# Patient Record
Sex: Female | Born: 1935 | Race: White | Hispanic: No | Smoking: Former smoker
Health system: Southern US, Community
[De-identification: ages and names within clinical notes are randomized; demographics above are authoritative.]

## PROBLEM LIST (undated history)

## (undated) DIAGNOSIS — U071 COVID-19: Secondary | ICD-10-CM

## (undated) DIAGNOSIS — E785 Hyperlipidemia, unspecified: Secondary | ICD-10-CM

## (undated) DIAGNOSIS — T8450XA Infection and inflammatory reaction due to unspecified internal joint prosthesis, initial encounter: Secondary | ICD-10-CM

## (undated) DIAGNOSIS — J45909 Unspecified asthma, uncomplicated: Secondary | ICD-10-CM

## (undated) DIAGNOSIS — G8929 Other chronic pain: Secondary | ICD-10-CM

## (undated) DIAGNOSIS — F32A Depression, unspecified: Secondary | ICD-10-CM

## (undated) DIAGNOSIS — D509 Iron deficiency anemia, unspecified: Secondary | ICD-10-CM

## (undated) DIAGNOSIS — G4700 Insomnia, unspecified: Secondary | ICD-10-CM

## (undated) DIAGNOSIS — K8020 Calculus of gallbladder without cholecystitis without obstruction: Secondary | ICD-10-CM

## (undated) DIAGNOSIS — N189 Chronic kidney disease, unspecified: Secondary | ICD-10-CM

## (undated) DIAGNOSIS — M1990 Unspecified osteoarthritis, unspecified site: Secondary | ICD-10-CM

## (undated) DIAGNOSIS — G459 Transient cerebral ischemic attack, unspecified: Secondary | ICD-10-CM

## (undated) DIAGNOSIS — F419 Anxiety disorder, unspecified: Secondary | ICD-10-CM

## (undated) DIAGNOSIS — F329 Major depressive disorder, single episode, unspecified: Secondary | ICD-10-CM

## (undated) DIAGNOSIS — J189 Pneumonia, unspecified organism: Secondary | ICD-10-CM

## (undated) DIAGNOSIS — C801 Malignant (primary) neoplasm, unspecified: Secondary | ICD-10-CM

## (undated) DIAGNOSIS — I10 Essential (primary) hypertension: Secondary | ICD-10-CM

## (undated) DIAGNOSIS — E119 Type 2 diabetes mellitus without complications: Secondary | ICD-10-CM

## (undated) DIAGNOSIS — K219 Gastro-esophageal reflux disease without esophagitis: Secondary | ICD-10-CM

## (undated) DIAGNOSIS — R7303 Prediabetes: Secondary | ICD-10-CM

## (undated) DIAGNOSIS — I480 Paroxysmal atrial fibrillation: Secondary | ICD-10-CM

## (undated) HISTORY — DX: Infection and inflammatory reaction due to unspecified internal joint prosthesis, initial encounter: T84.50XA

## (undated) HISTORY — PX: TOTAL HIP ARTHROPLASTY: SHX124

## (undated) HISTORY — PX: OTHER SURGICAL HISTORY: SHX169

## (undated) HISTORY — PX: REPLACEMENT TOTAL KNEE: SUR1224

## (undated) HISTORY — PX: CHOLECYSTECTOMY: SHX55

## (undated) HISTORY — PX: DILATION AND CURETTAGE OF UTERUS: SHX78

---

## 2005-11-26 ENCOUNTER — Ambulatory Visit: Payer: Self-pay | Admitting: Physical Medicine & Rehabilitation

## 2005-11-26 ENCOUNTER — Inpatient Hospital Stay (HOSPITAL_COMMUNITY): Admission: RE | Admit: 2005-11-26 | Discharge: 2005-11-28 | Payer: Self-pay | Admitting: Orthopedic Surgery

## 2005-11-29 ENCOUNTER — Ambulatory Visit: Payer: Self-pay | Admitting: Cardiology

## 2005-11-29 ENCOUNTER — Observation Stay (HOSPITAL_COMMUNITY): Admission: EM | Admit: 2005-11-29 | Discharge: 2005-11-30 | Payer: Self-pay | Admitting: Emergency Medicine

## 2005-12-05 ENCOUNTER — Inpatient Hospital Stay (HOSPITAL_COMMUNITY): Admission: EM | Admit: 2005-12-05 | Discharge: 2005-12-13 | Payer: Self-pay | Admitting: Emergency Medicine

## 2005-12-13 ENCOUNTER — Inpatient Hospital Stay (HOSPITAL_COMMUNITY)
Admission: RE | Admit: 2005-12-13 | Discharge: 2005-12-17 | Payer: Self-pay | Admitting: Physical Medicine & Rehabilitation

## 2006-01-24 ENCOUNTER — Encounter: Admission: RE | Admit: 2006-01-24 | Discharge: 2006-02-20 | Payer: Self-pay | Admitting: Orthopedic Surgery

## 2006-02-21 ENCOUNTER — Encounter: Admission: RE | Admit: 2006-02-21 | Discharge: 2006-03-05 | Payer: Self-pay | Admitting: Orthopedic Surgery

## 2008-05-14 ENCOUNTER — Encounter: Admission: RE | Admit: 2008-05-14 | Discharge: 2008-05-14 | Payer: Self-pay | Admitting: Internal Medicine

## 2009-06-26 ENCOUNTER — Encounter: Admission: RE | Admit: 2009-06-26 | Discharge: 2009-06-26 | Payer: Self-pay | Admitting: Orthopedic Surgery

## 2009-06-27 ENCOUNTER — Encounter: Admission: RE | Admit: 2009-06-27 | Discharge: 2009-06-27 | Payer: Self-pay | Admitting: Orthopedic Surgery

## 2009-08-08 ENCOUNTER — Encounter: Admission: RE | Admit: 2009-08-08 | Discharge: 2009-08-08 | Payer: Self-pay | Admitting: Orthopedic Surgery

## 2009-12-02 ENCOUNTER — Emergency Department (HOSPITAL_COMMUNITY): Admission: EM | Admit: 2009-12-02 | Discharge: 2009-12-02 | Payer: Self-pay | Admitting: Emergency Medicine

## 2009-12-23 ENCOUNTER — Encounter: Admission: RE | Admit: 2009-12-23 | Discharge: 2009-12-23 | Payer: Self-pay | Admitting: Sports Medicine

## 2010-04-19 ENCOUNTER — Encounter: Admission: RE | Admit: 2010-04-19 | Discharge: 2010-04-19 | Payer: Self-pay | Admitting: Internal Medicine

## 2010-05-07 ENCOUNTER — Emergency Department (HOSPITAL_COMMUNITY): Admission: EM | Admit: 2010-05-07 | Discharge: 2010-05-07 | Payer: Self-pay | Admitting: Emergency Medicine

## 2010-06-19 ENCOUNTER — Encounter: Admission: RE | Admit: 2010-06-19 | Discharge: 2010-06-19 | Payer: Self-pay | Admitting: Internal Medicine

## 2010-07-13 ENCOUNTER — Encounter: Admission: RE | Admit: 2010-07-13 | Discharge: 2010-07-13 | Payer: Self-pay | Admitting: Internal Medicine

## 2010-10-09 ENCOUNTER — Inpatient Hospital Stay (HOSPITAL_COMMUNITY)
Admission: RE | Admit: 2010-10-09 | Discharge: 2010-10-11 | Payer: Self-pay | Source: Home / Self Care | Attending: Orthopedic Surgery | Admitting: Orthopedic Surgery

## 2010-10-09 LAB — COMPREHENSIVE METABOLIC PANEL
ALT: 23 U/L (ref 0–35)
AST: 33 U/L (ref 0–37)
Albumin: 3.8 g/dL (ref 3.5–5.2)
Alkaline Phosphatase: 65 U/L (ref 39–117)
BUN: 20 mg/dL (ref 6–23)
CO2: 29 mEq/L (ref 19–32)
Calcium: 9.8 mg/dL (ref 8.4–10.5)
Chloride: 104 mEq/L (ref 96–112)
Creatinine, Ser: 0.99 mg/dL (ref 0.4–1.2)
GFR calc Af Amer: >60 mL/min (ref >60)
GFR calc non Af Amer: 55 mL/min — ABNORMAL LOW (ref >60)
Glucose, Bld: 94 mg/dL (ref 70–99)
Potassium: 5.1 mEq/L (ref 3.5–5.1)
Sodium: 142 mEq/L (ref 135–145)
Total Bilirubin: 0.6 mg/dL (ref 0.3–1.2)
Total Protein: 6.7 g/dL (ref 6.0–8.3)

## 2010-10-09 LAB — URINE MICROSCOPIC-ADD ON

## 2010-10-09 LAB — URINALYSIS, ROUTINE W REFLEX MICROSCOPIC
Bilirubin Urine: NEGATIVE
Hgb urine dipstick: NEGATIVE
Ketones, ur: NEGATIVE mg/dL
Nitrite: NEGATIVE
Protein, ur: NEGATIVE mg/dL
Specific Gravity, Urine: 1.014 (ref 1.005–1.030)
Urine Glucose, Fasting: NEGATIVE mg/dL
Urobilinogen, UA: 0.2 mg/dL (ref 0.0–1.0)
pH: 5 (ref 5.0–8.0)

## 2010-10-09 LAB — DIFFERENTIAL
Basophils Absolute: 0 10*3/uL (ref 0.0–0.1)
Basophils Relative: 0 % (ref 0–1)
Eosinophils Absolute: 0.2 10*3/uL (ref 0.0–0.7)
Eosinophils Relative: 3 % (ref 0–5)
Lymphocytes Relative: 29 % (ref 12–46)
Lymphs Abs: 2.6 10*3/uL (ref 0.7–4.0)
Monocytes Absolute: 0.6 10*3/uL (ref 0.1–1.0)
Monocytes Relative: 7 % (ref 3–12)
Neutro Abs: 5.7 10*3/uL (ref 1.7–7.7)
Neutrophils Relative %: 62 % (ref 43–77)

## 2010-10-09 LAB — HEMOGLOBIN A1C
Hgb A1c MFr Bld: 6 % — ABNORMAL HIGH (ref <5.7)
Mean Plasma Glucose: 126 mg/dL — ABNORMAL HIGH (ref <117)

## 2010-10-09 LAB — CBC
HCT: 41.2 % (ref 36.0–46.0)
Hemoglobin: 13.6 g/dL (ref 12.0–15.0)
MCH: 30.6 pg (ref 26.0–34.0)
MCHC: 33 g/dL (ref 30.0–36.0)
MCV: 92.8 fL (ref 78.0–100.0)
Platelets: 267 10*3/uL (ref 150–400)
RBC: 4.44 MIL/uL (ref 3.87–5.11)
RDW: 13.5 % (ref 11.5–15.5)

## 2010-10-09 LAB — URINE CULTURE: Colony Count: 3000

## 2010-10-09 LAB — SURGICAL PCR SCREEN
MRSA, PCR: NEGATIVE
Staphylococcus aureus: NEGATIVE

## 2010-10-09 LAB — PROTIME-INR: Prothrombin Time: 12.6 seconds (ref 11.6–15.2)

## 2010-10-09 LAB — APTT: aPTT: 33 seconds (ref 24–37)

## 2010-10-11 LAB — GLUCOSE, CAPILLARY
Glucose-Capillary: 110 mg/dL — ABNORMAL HIGH (ref 70–99)
Glucose-Capillary: 155 mg/dL — ABNORMAL HIGH (ref 70–99)
Glucose-Capillary: 133 mg/dL — ABNORMAL HIGH (ref 70–99)
Glucose-Capillary: 124 mg/dL — ABNORMAL HIGH (ref 70–99)
Glucose-Capillary: 145 mg/dL — ABNORMAL HIGH (ref 70–99)
Glucose-Capillary: 112 mg/dL — ABNORMAL HIGH (ref 70–99)

## 2010-10-11 LAB — BASIC METABOLIC PANEL
BUN: 9 mg/dL (ref 6–23)
CO2: 29 mEq/L (ref 19–32)
Calcium: 8.8 mg/dL (ref 8.4–10.5)
Chloride: 107 mEq/L (ref 96–112)
Creatinine, Ser: 0.83 mg/dL (ref 0.4–1.2)
GFR calc Af Amer: >60 mL/min (ref >60)
GFR calc non Af Amer: >60 mL/min (ref >60)
Glucose, Bld: 132 mg/dL — ABNORMAL HIGH (ref 70–99)
Potassium: 5.6 mEq/L — ABNORMAL HIGH (ref 3.5–5.1)
Sodium: 141 mEq/L (ref 135–145)

## 2010-10-11 LAB — CBC
HCT: 32.8 % — ABNORMAL LOW (ref 36.0–46.0)
Hemoglobin: 10.4 g/dL — ABNORMAL LOW (ref 12.0–15.0)
MCH: 29.5 pg (ref 26.0–34.0)
MCHC: 31.7 g/dL (ref 30.0–36.0)
MCV: 93.2 fL (ref 78.0–100.0)
Platelets: 207 10*3/uL (ref 150–400)
RBC: 3.52 MIL/uL — ABNORMAL LOW (ref 3.87–5.11)
RDW: 13.2 % (ref 11.5–15.5)
WBC: 6.2 10*3/uL (ref 4.0–10.5)

## 2010-10-11 LAB — TYPE AND SCREEN
ABO/RH(D): A POS
Antibody Screen: NEGATIVE

## 2010-10-11 LAB — PROTIME-INR
INR: 0.98 (ref 0.00–1.49)
Prothrombin Time: 13.2 seconds (ref 11.6–15.2)

## 2010-10-16 ENCOUNTER — Encounter: Payer: Self-pay | Admitting: Internal Medicine

## 2010-10-16 LAB — CBC
HCT: 29.2 % — ABNORMAL LOW (ref 36.0–46.0)
Hemoglobin: 9.2 g/dL — ABNORMAL LOW (ref 12.0–15.0)
MCH: 29.6 pg (ref 26.0–34.0)
MCHC: 31.5 g/dL (ref 30.0–36.0)
MCV: 93.9 fL (ref 78.0–100.0)
RBC: 3.11 MIL/uL — ABNORMAL LOW (ref 3.87–5.11)
RDW: 13.2 % (ref 11.5–15.5)

## 2010-10-16 LAB — GLUCOSE, CAPILLARY

## 2010-10-16 LAB — BASIC METABOLIC PANEL
BUN: 8 mg/dL (ref 6–23)
Calcium: 8.6 mg/dL (ref 8.4–10.5)
GFR calc Af Amer: >60 mL/min (ref >60)
GFR calc non Af Amer: >60 mL/min (ref >60)

## 2010-10-16 LAB — PROTIME-INR
INR: 1.07 (ref 0.00–1.49)
Prothrombin Time: 14.1 seconds (ref 11.6–15.2)

## 2010-10-25 NOTE — Op Note (Signed)
[name], NGUON               ACCOUNT NO.:  [id number]  MEDICAL RECORD NO.:  [id number]          PATIENT TYPE:  INP  LOCATION:  5022                         FACILITY:  [facility]  PHYSICIAN:  Audree Camel. Noemi Chapel, M.D. DATE OF BIRTH:  [birth year]  DATE OF PROCEDURE:  10/09/2010 DATE OF DISCHARGE:                              OPERATIVE REPORT   PREOPERATIVE DIAGNOSIS:  Left hip degenerative joint disease.  POSTOPERATIVE DIAGNOSIS:  Left hip degenerative joint disease.  PROCEDURE:  Left total hip replacement using DePuy Press-Fit Summit total hip system with acetabulum 52-mm Press-Fit pinnacle acetabulum with 10-degree polyethylene liner and 1 locking screw.  Femoral component #4 Press-Fit Summit, femoral component with +1 x 36 mm femoral head.  SURGEON:  Audree Camel. Noemi Chapel, MD  ASSISTANT:  Matthew Saras, PA  ANESTHESIA:  General.  OPERATIVE TIME:  1 hour and 20 minutes.  ESTIMATED BLOOD LOSS:  250 mL.  COMPLICATIONS:  None.  DESCRIPTION OF PROCEDURE:  Ms. Petruska was brought to the operating room on October 09, 2010, placed on operative table in supine position. After an adequate level of general anesthesia was obtained, her left hip was examined.  She had flexion at 90, extension to 0, internal and external rotation of 10 degrees with approximately 2-1/2 inches of lengthening of the left hip compared to the right.  She had a Foley catheter placed under sterile conditions and received Ancef 2 g IV preoperatively for prophylaxis.  After being placed under general anesthesia, she was turned in the left lateral decubitus position and secured on the bed with a Stulberg frame.  Her left hip and leg was prepped using sterile DuraPrep and draped using sterile technique.  Time- out procedure was called and the correct left hip identified.  Initially through a 15 cm posterolateral greater trochanteric incision, initial exposure was made.  The underlying subcutaneous tissues were  incised along with skin incision.  The iliotibial band and gluteus maximus fascia was incised longitudinally revealing the underlying short external rotators of the hip and hip capsule.  Sciatic nerve carefully protected while the short external rotators of the hip and hip capsule were released off the femoral neck insertion intact.  The hip was then posteriorly dislocated.  The femoral neck cut was then made 1-1/2 to 2 cm above the lesser trochanter in the appropriate manner of anteversion, abduction and inclination.  The femoral head was removed.  She was found to have grade 3 and 4 changes in the femoral head.  Carefully placed retractors were then placed around the acetabulum.  Degenerative acetabular labrum was resected.  She was also found to have grade 3 and 4 DJD in the acetabulum.  At this point, sequential acetabular reaming was carried out up to a 51 mm size in the appropriate manner of anteversion, abduction and inclination.  Then, a 52-mm acetabular trial was placed, found to be an excellent fit.  It was then removed and the actual 52-mm Press-Fit pinnacle acetabular shell was hammered into position and the appropriate manner anteversion, abduction inclination with an excellent fit and then 125 mm locking screw was placed in 12 o'clock position.  A 10 degrees polyethylene liner was then placed with the 10 degrees lip in the posterolateral position.  At this point the proximal femur was then exposed.  Proximal distal reaming was carried out to a #4 size, then broaching to a #4 size and with a #4 broach in place and the appropriate manner of anteversion and inclination and a +1 x 36 mm high offset hip ball and neck, the hip was reduced, taken through range of motion and found to be stable up to 90 degrees of internal rotation in both neutral and 30 degrees of adduction and also stable in abduction, external rotation and leg lengths were made closer to normal with an 1 inch.  At  this point, the femoral trial was then removed.  Femoral canal was irrigated and then the #4 Press-Fit Summit stem was hammered into position with an excellent fit and then the +1 x 36 mm hip ball was hammered on the femoral neck with an excellent Morse taper fit.  The hip then reduced, taken through range of motion, again found to be stable in both internal and external rotation, stable in abduction, external rotation as well as internal rotation and leg lengths within 1 inch of each other.  At this point it was felt that all the components were of excellent size and stability.  The wound was further irrigated with saline and then the short external rotators of the hip and hip capsule were reattached to their femoral neck insertion through 2 drill holes in the greater trochanter.  The iliotibial band and gluteus maximus fascia was closed with #1 Ethibond suture. Subcutaneous tissue was closed with 0 and 2-0 Vicryl, subcuticular layer closed with 4-0 Monocryl.  Sterile dressings and an abduction pillow applied.  The patient then turned supine, checked for leg lengths, they were within 1 inch of other rotation equal, pulses 2+ and symmetric. Needle and sponge counts were correct x2 at the end of the case.  She was then awakened, extubated, and taken to the recovery room in stable condition.     Catalyna Reilly A. Noemi Chapel, M.D.     RAW/MEDQ  D:  10/09/2010  T:  10/10/2010  Job:  [id number]  Electronically Signed by Elsie Saas M.D. on 10/25/2010 10:43:38 AM

## 2011-01-04 ENCOUNTER — Other Ambulatory Visit: Payer: Self-pay | Admitting: *Deleted

## 2011-01-04 ENCOUNTER — Other Ambulatory Visit: Payer: Self-pay | Admitting: Orthopedic Surgery

## 2011-01-04 ENCOUNTER — Encounter (INDEPENDENT_AMBULATORY_CARE_PROVIDER_SITE_OTHER): Payer: Medicare Other | Admitting: *Deleted

## 2011-01-04 DIAGNOSIS — M7989 Other specified soft tissue disorders: Secondary | ICD-10-CM

## 2011-01-04 DIAGNOSIS — M79609 Pain in unspecified limb: Secondary | ICD-10-CM

## 2011-01-04 DIAGNOSIS — R609 Edema, unspecified: Secondary | ICD-10-CM

## 2011-01-08 ENCOUNTER — Encounter: Payer: Self-pay | Admitting: *Deleted

## 2011-02-09 NOTE — Op Note (Signed)
NAMEAVERIANA, [name]               ACCOUNT NO.:  [id number]   MEDICAL RECORD NO.:  [id number]          PATIENT TYPE:  INP   LOCATION:  5013                         FACILITY:  [facility]   PHYSICIAN:  Astrid Divine. Marcelino Scot, M.D. DATE OF BIRTH:  1931-12-03   DATE OF PROCEDURE:  12/10/2005  DATE OF DISCHARGE:                                 OPERATIVE REPORT   PREOPERATIVE DIAGNOSES:  1.  Right midshaft femur fracture.  2.  Left distal radius fracture.   POSTOPERATIVE DIAGNOSES:  1.  Right midshaft femur fracture.  2.  Left distal radius fracture.   PROCEDURE:  1.  Intramedullary nailing of the right femur using a [employer] T2      statically locked 10 x 360 mm nail.  2.  Open reduction internal fixation left distal radius using a DVR anatomic      plate.   SURGEON:  Altamese [place].   ASSISTANT:  Crissie Sickles.   ANESTHESIA:  General.   COMPLICATIONS:  None.   TOTAL TOURNIQUET TIME:  Left upper extremity 37 minutes.   ESTIMATED BLOOD LOSS:  180 mL with reaming.   DISPOSITION:  To PACU condition stable.   BRIEF SUMMARY OF INDICATIONS FOR PROCEDURE:  [name] is a very pleasant  77 year old female who underwent total knee arthroplasty by Dr. Elsie Saas about 4 weeks ago. She did outstanding following the knee replacement  and actually when home on postoperative day #2.  Unfortunately short time  later she was readmitted for a transient ischemic attack and after being  discharged home from that episode, fell in her driveway sustaining a right  femoral shaft fracture as well as left distal radius fracture. These were  treated initially with traction for the right leg and closed reduction of  the left distal radius because of an elevated INR. She was kept on Lovenox  for DVT prophylaxis given her TIA pending resolution of her elevated INR. We  discussed with her the risks and benefits of surgery preoperatively  including possibility of heart attack, stroke, DVT, PE, infection,  nerve  injury, vessel injury, need for further surgery, delayed union, nonunion and  decreased range of motion of the adjacent joints. She understood these risks  quite clearly as did her family who was present for the discussion and they  wished to proceed.   BRIEF DESCRIPTION OF PROCEDURE:  [name] was taken to operating room  after administration of a preoperative antibiotics. Her right lower  extremity and left upper extremity were prepped and draped usual sterile  fashion. We began with the right femur where a 2 cm incision was made  proximal to greater trochanter and a curved cannulated awl used to establish  a piriformis starting point through which a threaded guidewire was advanced.  A curved cannulated awl was then used to advance over the wire into the  proximal femur. The ball-tip guidewire was then advanced in the center  center position of the distal femur across the fracture site watching both  AP and lateral images. We did use manual traction and were able to fine tune  the reduction and facilitated this with use of the same threaded guidewire  into the distal femoral fragment at the anterolateral cortex engaging it  unicortically there. We then sequentially reamed it beginning with the  starting reamer 8 mm going up to 11.5.  The D2 nail was then advanced across  the fracture site and locked with two lateral to medial screws. We did not  use any blocking screws because of the hairline nondisplaced fracture which  extended through the anterior cortex from the distal extent of the split. We  of were able to achieve a nice reduction with less than 3 degrees or so of  relative varus.  The alignment at the knee remained overall excellent. We  then placed two proximal locks.  The wounds were irrigated and closed in  standard layered fashion with 2-0 Vicryl and 3-0 nylon. Sterile gently  compressive dressing were applied from foot to thigh. Attention was then  turned to the  left wrist where we exposed the distal radius through a  standard volar approach incising the FCR tendon sheath and then incising the  deep portion of this carefully continued the dissection until the pronator  fascia was exposed. Before going through the fascia we then used the Esmarch  bandage to exsanguinate the extremity and elevated the tourniquet to 275  mmHg.  We then incised the fascia close to the radial edge leaving a cuff of  tissue for repair. We then used a Freer to tease up the muscle and expose  the fracture site. Homan was placed along the ulnar and radial corners and  then the DVR plate used to achieve appropriate position using a K-wire for  provisional fixation. We were pleased with this and on AP and lateral  projections and then inserted a series of terminally threaded locked pegs  and standard locked pegs into the distal segment. We then snugged the plate  down to the bone and used slight distraction to regain a little bit of  length and had already used the Homan's to produce appropriate inclination  along the radial styloid. Two screws in the shaft were deemed sufficient  given the overall stability of the fixation and final AP and lateral images  showed appropriate reduction with restoration of radial height, inclination  and tilt.  The hardware was of appropriate length after exchanging the  initial radial styloid screw which was slightly too long. The wound was  copiously irrigated and closed with a standard layer fashion with 2-0  Vicryl, a Monocryl and Prolene with Steri-Strips. The patient was then  placed into a well-padded dressing and volar splint, awakened from  anesthesia and transported PACU in stable condition.   PROGNOSIS:  [name] has had multiple complications since her total knee  arthroplasty 4 weeks ago but continues to do quite well after each of them. She will be allowed to weight bear as tolerated through left elbow but  maintain  nonweightbearing to the left wrist.  Once her wound is healed, we  will allow for active and passive motion with supervision of occupational  therapy with regard to her right femur fracture. It is oblique and therefore  unstable for weightbearing.  She will need to maintain nonweightbearing or  touchdown weightbearing over the next 6 to 8 weeks until clinical,  radiographic evidence of some of healing. She will continue on Lovenox for  DVT prophylaxis and will also begin Coumadin discontinuing Lovenox when she  is therapeutic. She remains at elevated risk  for stroke, heart attack and  decreased range of motion, given the recent history of complications.      Astrid Divine. Marcelino Scot, M.D.  Electronically Signed     MHH/MEDQ  D:  12/11/2005  T:  12/12/2005  Job:  [id number]

## 2011-02-09 NOTE — Op Note (Signed)
NAMEJANEAN, PEDRAZA               ACCOUNT NO.:  [id number]   MEDICAL RECORD NO.:  [id number]          PATIENT TYPE:  INP   LOCATION:  2899                         FACILITY:  [facility]   PHYSICIAN:  Audree Camel. Noemi Chapel, M.D. DATE OF BIRTH:  09/02/1932   DATE OF PROCEDURE:  11/26/2005  DATE OF DISCHARGE:                                 OPERATIVE REPORT   PREOPERATIVE DIAGNOSIS:  Right knee DJD.   POSTOPERATIVE DIAGNOSIS:  Right knee DJD.   PROCEDURE:  Right total knee replacement using DePuy cemented total knee  system with number 2.5 cemented femur, number 3 cemented tibia with 12.5 mm  polyethylene RP tibial spacer and 35 mm polyethylene cemented patella.   SURGEON:  Audree Camel. Noemi Chapel, M.D.   ASSISTANT:  Matthew Saras, P.A.   ANESTHESIA:  General.   OPERATIVE TIME:  1 hour 15 minutes.   COMPLICATIONS:  None.   DESCRIPTION OF PROCEDURE:  Ms. Gamel was brought to operating room on  11/26/2005 after a femoral nerve block and placed in the holding room by  anesthesia. She was placed operative table in the supine position. She  received Ancef 4 grams IV preoperatively for prophylaxis. After being placed  under general anesthesia, she had a Foley catheter placed under sterile  conditions. A right knee was examined. Range of motion from minus 8-120  degrees with significant varus deformity, knee stable to ligamentous exam  with normal patellar tracking. Right leg was prepped using sterile DuraPrep  and draped using sterile technique. Leg was exsanguinated and a thigh  tourniquet elevated 365 mm. Initially through a 15 cm longitudinal incision  based over the patella, initial exposure was made.  The underlying  subcutaneous tissues were incised in line with the skin incision. A median  arthrotomy was performed revealing an excessive amount of normal appearing  joint fluid. The articular surfaces were inspected. She had grade 4 changes  medially, grade 3 changes laterally and grade  3 and 4 changes in the  patellofemoral joint. The medial and lateral meniscal remnants were made as  well as the anterior cruciate ligament. Osteophytes were removed off the  femoral condyles and tibial plateau. Intramedullary drill was then drilled  up the femoral canal for placement distal femoral cutting jig which was  placed in the appropriate manner rotation and the distal 12 mm cut was made.  The distal femur was sized. 2.5 was found be the appropriate size and the  2.5 cutting jig was placed and these cuts were made. The proximal tibia was  then exposed. The tibial spines were removed with an oscillating saw.  Intramedullary drill was drilled down the tibial canal for placement of  proximal tibial cutting jig which was placed in the appropriate amount of  rotation and a proximal 8 mm cut was made based off the medial or lower  side. Spacer blocks were then placed in flexion and extension.  12.5 mm  blocks gave excellent stability and excellent correction of her flexion and  varus deformities. The #3 tibial base plate was then placed on the cut  surface of the proximal tibia  and the keel cut was made. After this was done  the PCL box cutter was placed on the distal femur. These cuts were then  made. At this point the number 2.5 femoral trial was placed. A #3 tibial  base plate trial was placed and with a 12.5 mm polyethylene RP tibial spacer  the knee was reduced, taken through range of motion from 0 to 125 degrees  with excellent stability and excellent correction of her flexion and varus  deformities. The patella was sized. A resurfacing 8 mm cut was made and  three locking holes were placed and then the 35 mm polyethylene patellar  trial was placed. Patellofemoral tracking was then evaluated and this was  found to be normal. At this point felt that all the trial components were of  excellent size, fit and stability. They were then removed and the knee was  then jet lavage irrigated  with 3 liters saline solution. The proximal tibia  was exposed and #3 tibial baseplate with cement backing was hammered in  position with an excellent fit with excess cement being removed from around  the edges. Number 2.5 femoral component with cement backing was hammered  into position also with excellent fit with excess cement being removed from  around the edges. A 12.5 mm polyethylene RP tibial spacer was placed on the  tibial baseplate. The knee reduced, taken through range of motion from 0 to  125 degrees with excellent stability and excellent correction of her flexion  and varus deformities. The 35 mm polyethylene cement backed patella was then  placed in this position and held there with a clamp. After the cement  hardened, patellofemoral tracking was again evaluated and this was found to  be normal. At this point it was felt that all components were excellent  size, fit and stability. They were further irrigated with saline. The  tourniquet was released. Hemostasis was obtained with cautery. The  arthrotomy was then closed with #1 Ethibond suture over two medium Hemovac  drains. Subcutaneous tissues closed with 0 and 2-0 Vicryl.  Subcuticular  layer closed with 4-0 Monocryl. Steri-Strips were applied. Sterile dressings  were applied. Hemovac injected with 0.2% Marcaine with epinephrine and 4  milligrams morphine. The patient awakened, extubated and taken to recovery  room in stable condition. Needle, sponge counts correct x2 in the case.      Robert A. Noemi Chapel, M.D.  Electronically Signed     RAW/MEDQ  D:  11/26/2005  T:  11/26/2005  Job:  [id number]

## 2011-02-09 NOTE — Discharge Summary (Signed)
NAMEJELANI, HLAVATY               ACCOUNT NO.:  [id number]   MEDICAL RECORD NO.:  [id number]          PATIENT TYPE:  IPS   LOCATION:  4005                         FACILITY:  [facility]   PHYSICIAN:  Audree Camel. Noemi Chapel, M.D. DATE OF BIRTH:  12-12-1931   DATE OF ADMISSION:  12/13/2005  DATE OF DISCHARGE:  12/17/2005                                 DISCHARGE SUMMARY   ADMISSION DIAGNOSES:  1.  End-stage degenerative joint disease, right knee.  2.  Hypertension.  3.  High cholesterol.  4.  Gastroesophageal reflux.   DISCHARGE DIAGNOSES:  1.  End-stage degenerative joint disease; status post right total knee      replacement.  2.  Hypertension.  3.  High cholesterol.  4.  Gastroesophageal reflux.   HISTORY OF PRESENT ILLNESS:  The patient is a 77 year old white female with  a history of hypertension, COPD and end-stage DJD.  She has failed  conservative care, including anti-inflammatories and intra-articular  cortisone injections.  She understands the risks, benefits and possible  complications of right total knee replacement, and is without question.   PROCEDURES IN-HOUSE:  On November 26, 2005 the patient underwent a right total  knee replacement by Dr. Noemi Chapel, and had a femoral block by anesthesia.  She  was admitted postoperatively for pain control, DVT prophylaxis and physical  therapy.   HOSPITAL COURSE:  On postoperative day 1 the patient was doing very well,  without complaints.  Hemoglobin was 9.9.  She was metabolically stable.  Her  INR was 1.0.  The surgical wound was well approximated.  Her drain was  discontinued by me.  Her PCA was discontinued.  She was started on Percocet  and Robaxin.  She is on a CPM 0-90 degrees.   On postoperative day #2 the patient did remarkably well with physical  therapy, and is requesting to go home early.  She ambulated 300 feet and up  and down 3 steps.   On postoperative day #3 the patient was doing well; she had no complaints.  The Vicodin  was not strong enough, so she was placed on Percocet.  Hemoglobin was 9.3.  She was metabolically stable.  Her INR was 1.3.  The  surgical wound was well approximated.  She did have some drainage from her  lateral drain hole.   She was discharged in stable condition, weightbearing as tolerated and on a  regular diet.  Home CPM.   DISCHARGE MEDICATIONS:  1.  She was given a prescription for Norco 10 325 mg 1-2 q.4-6 h. p.r.n.      pain.  2.  Robaxin 500 mg 1-2 q.6 h. p.r.n. muscle spasm.  3.  Restoril 15 mg one p.o. q.h.s.  4.  Coumadin 5 mg 1-1/2 tablets daily.  5.  Gemfibrizil 600 mg b.i.d.  6.  Atenolol 50 mg daily.  7.  Xanax 0.5 mg as needed.  8.  Nexium 40 mg daily.  9.  Norvasc 10 mg daily.  10. Albuterol and Flovent daily.   SPECIAL INSTRUCTIONS:  She will follow-up with Dr. Noemi Chapel on December 10, 2005.  Kirstin Shepperson, P.A.      Robert A. Noemi Chapel, M.D.  Electronically Signed    KS/MEDQ  D:  01/11/2006  T:  01/14/2006  Job:  [id number]

## 2011-02-09 NOTE — Discharge Summary (Signed)
[name], [name]               ACCOUNT NO.:  [id number]   MEDICAL RECORD NO.:  [id number]          PATIENT TYPE:  INP   LOCATION:  [id number]                         FACILITY:  [facility]   PHYSICIAN:  [name], M.D.DATE OF BIRTH:  12-12-1931   DATE OF ADMISSION:  11/29/2005  DATE OF DISCHARGE:  11/30/2005                                 DISCHARGE SUMMARY   FINAL DIAGNOSIS:  Right [name] and arm numbness, unknown etiology (782.0).   PROCEDURE:  1.  MRI brain.  2.  MRA intracranial.  3.  2-D echocardiogram.  4.  Carotid Doppler.  5.  Blood chemistries.   COMPLICATIONS:  None.   HISTORY OF PRESENT ILLNESS:  The patient is a 77 year old woman who had 2  episodes of numbness and paresthesias involving the right lower [name], hand,  and forearm without other focal symptoms and with complete resolution. She  was admitted to evaluate her neurovascular system for possible transient  ischemic attacks.   HOSPITAL COURSE:  The patient has been well in the hospital. She has had no  further episodes. The patient just recently had total knee replacement on  the right side. Was not treated with Lovenox as a bridging dose and was sent  home on Coumadin. Her INR yesterday was 1.6. She received Coumadin 7.5 mg  last night and will receive 5 mg today, tomorrow, and Sunday. She will have  her prothrombin time checked on Monday by a visiting nurse.   The patient had an MRI scan of the brain, which I have reviewed. It shows  mild atrophy and some chronic small vessel changes but no evidence of acute  stroke. MRA showed evidence of rather diffuse irregularity of the extra-  cranial carotid; the intracranial middle cerebral artery, left greater than  right; and the right posterior cerebral artery. The basilar artery looked  fairly normal, both in terms of its size and regularity.   The patient's risk factors for stroke include hypertension, dyslipidemia,  associated co-morbidities,  gastroesophageal reflux, osteoarthritis,  insomnia, and obesity.   A 2-D echocardiogram and carotid Doppler are pending at this time. TCD was  also pending.   The patient's homocystine is normal at 6.0. Lipid panel and hemoglobin A1C  are pending at this time.   LABORATORY DATA:  Hemoglobin and hematocrit 9.4 and 27.4. White blood cell  count 8,700. Platelet count 306,000. MCV 91.2. Sodium 138, potassium 4.1,  chloride 107, CO2 28, BUN 9, creatinine 0.7, glucose 116. Other random  glucose's have been 141 and 128, suggesting that the patient may have some  glucose intolerance. Total protein was 6.0. Prothrombin time on November 29, 2005 was 1.6. PTT 53. AST 56, ALT 43 and slightly elevated. Albumin 2.5,  somewhat low. Alkaline phosphatase 54, total bilirubin 0.5. Calcium 8.2.  Hemoglobin A1C is 6.0, which is within normal range. Homocystine is pending  at this time.   PLAN:  The patient is doing well. She will be discharged on the following  medications.   DISCHARGE MEDICATIONS:  1.  Norvasc 10 mg daily.  2.  Gemfibrozil 600 mg twice daily.  3.  Alprazolam 0.5 mg three times daily as needed.  4.  Nexium 40 mg every morning.  5.  Atenolol 50 mg daily.  6.  Warfarin 5 mg for the 9th, 10th, and 11th.  7.  Temazepam 15 to 30 mg at bedtime as needed.  8.  Methocarbamol 500 mg every 4 to 6 hours as needed for spasm.  9.  Hydrocodone APAP 1 to 2 tablets every 4 to 6 hours as needed for pain.  10. Docusate 100 mg twice daily.  11. Flovent 2 puffs twice daily as needed.  12. Ventolin 1 puff every 6 hours as needed.   CONDITION ON DISCHARGE:  Improved.   FOLLOW UP:  She will return to followup with [name] in our neurologic  followup clinic in 6 to 8 weeks' time.      [name], M.D.  Electronically Signed     WHH/MEDQ  D:  11/30/2005  T:  12/01/2005  Job:  [id number]   cc:   [name], M.D.   [name], N.P.

## 2011-02-09 NOTE — H&P (Signed)
[name], [name] NO.:  [id number]   MEDICAL RECORD NO.:  [id number]          PATIENT TYPE:  IPS   LOCATION:  4005                         FACILITY:  [facility]   PHYSICIAN:  Jarvis Morgan, M.D.   DATE OF BIRTH:  [birth year]   DATE OF ADMISSION:  12/13/2005  DATE OF DISCHARGE:                                HISTORY & PHYSICAL   PRIMARY CARE PHYSICIAN:  Aliene Beams, M.D.   NEUROLOGIST:  Pramod P. Leonie Man, M.D.   ORTHOPEDIST:  Astrid Divine. Marcelino Scot, M.D.   HISTORY OF PRESENT ILLNESS:  [name] is a 77 year old adult female with  a history of hypertension who has undergone a right total knee replacement  on November 26, 2005.   The patient reports tingling in her arm a couple of days after admission and  was reevaluated.  She subsequently was released.  She returned home and had  a fall at home on December 05, 2005.  At that time, she was brought into the  emergency room where x-rays showed a right mid shaft femur fracture with  displacement along with a distal left radius fracture.   The patient was placed on traction and neurology was consulted for input and  clearance for surgery.  Once her INR was reversed, the patient underwent  intermedullary nailing of the right femur along with ORIF of the distal left  radius, December 10, 2005, by Dr. Marcelino Scot.   Postoperatively, the patient was allowed nonweightbearing on the left upper  extremity and the right lower extremity.  Coumadin was resumed with INR of  1.3 today.  Neurology recommended restart of aspirin after Coumadin was  completed for DVT prophylaxis.   REVIEW OF SYSTEMS:  Positive for insomnia, anxiety, wound healing.   PAST MEDICAL HISTORY:  1.  Hypertension.  2.  Right total knee replacement November 26, 2005.  3.  Gastroesophageal reflux disease.  4.  Obesity.  5.  Transient ischemic attack November 28, 2005.  6.  Prior cholecystectomy.  7.  Dyslipidemia.  8.  Asthmatic bronchitis.  9.  Prior cesarean section.  10.  Anxiety disorder.   FAMILY HISTORY:  Positive for diabetes mellitus and coronary artery disease.   SOCIAL HISTORY:  The patient lives alone in a one-level home with a ramp at  the entry.  She is independent with a walker.  Family is available to assist  as needed at discharge. The patient quit tobacco more than 30 years ago and  denies alcohol usage.   FUNCTIONAL HISTORY:  Prior to admission:  Independent with a walker  secondary to recent knee surgery.   ALLERGIES:  HYDROCHLOROTHIAZIDE, LISINOPRIL, ZOCOR.   MEDICATIONS:  Prior to admission:  1.  Coumadin for DVT prophylaxis.  2.  Norvasc 10 mg daily.  3.  Lopid 600 mg b.i.d.  4.  Xanax 0.5 mg t.i.d. p.r.n.  5.  Nexium 40 mg daily.  6.  Atenolol 50 mg daily.  7.  Restoril 15 mg q.h.s.  8.  Robaxin p.r.n.  9.  Vicodin p.r.n.  10. Flovent p.r.n.  11. Ventolin p.r.n.   RECENT LABS:  Hemoglobin 9.7, hematocrit 29.0, platelet count 654,000, and  white count was 10.0. Recent sodium was 139, potassium 4.5, chloride 104,  CO2 28, BUN 14, and creatinine 0.7 with glucose of 112.   PHYSICAL EXAMINATION:  GENERAL:  Reasonably well appearing, overweight,  adult female lying in bed in no acute discomfort.  HEENT:  Normocephalic and atraumatic.  CARDIOVASCULAR:  Regular rate and rhythm, S1 and S2, without murmurs.  ABDOMEN:  Soft, obese, nontender with positive bowel sounds.  LUNGS:  Clear to auscultation bilaterally.  NEUROLOGY:  Alert and oriented x3.  Cranial nerves II-XII grossly intact.  Right upper and left lower extremity examination showed 5/5 strength  throughout.  Bulk and tone were normal.  Reflexes were 2+ and symmetrical.  Left upper extremity revealed a splint from her wrist to her elbow on the  left.  She had weak grip on the left and 4-/5 strength in the proximal left  upper extremity.  Right lower extremity examination showed well-healing  right knee wound where a prior knee replacement had been done.  There were  also  approximately three areas with sutures in place where the  intermedullary nailing was done.  There is slight bruising of the right  lower extremity with some mild edema.  Strength in the right lower extremity  was not tested secondary to nonweightbearing restriction.   IMPRESSION:  1.  Status post recent right total knee replacement with subsequent fall and      development of distal left radius fracture and mid shaft femur fracture      on the right.  Presently, the patient had a deficit in ADL's, transfers,      and ambulation secondary to the total knee replacement along with the      subsequent fall and resultant fractures of the left upper and right      lower extremity.   PLAN:  Admit to the rehabilitation unit for daily OT and PT therapies to  advance ADL's, transfers, and ambulation with nonweightbearing on the left  upper and right lower extremity unless advanced by Dr. Marcelino Scot, orthopedics.  24-hour nursing for medications administration, monitoring of vitals, and  wound dressing changes as needed.  Check admission labs, insulin, CBC, and  CMET Friday, December 14, 2005.  Monitor hypertension on Norvasc 10 mg daily  and Tenormin 50 mg daily.  Continue Lipitor 10 mg daily dispense as written  along with Lopid 600 mg p.o. b.i.d.  Continue Flovent 220 mcg one puff  b.i.d. along with Ventolin metered dose inhaler 1-2 puffs q.i.d. p.r.n.  Increase OxyContin CR to 40 mg q.12 hours along with Norco 10/325 one tablet  p.o. q.4-6 hours p.r.n. for pain, dispense as written.  Continue Nexium 40  mg daily for GI prophylaxis.  Add Trinsicon one tablet p.o. b.i.d. for  postoperative anemia.  Ice pack to the right leg p.r.n.  Robaxin 500 mg  q.i.d. for spasms. Continue subcu Lovenox 40 mg daily until INR consistently  greater than 2.0 on Coumadin.  Continue Coumadin per pharmacy.   PROGNOSIS:  Good.  ESTIMATED LENGTH OF STAY:  5-10 days.   GOALS:  Minimal assist to standby assist for transfers  and standby assist to  minimal assist with ADL's and ambulation.           ______________________________  Jarvis Morgan, M.D.     DC/MEDQ  D:  12/13/2005  T:  12/14/2005  Job:  [id number]

## 2011-02-09 NOTE — Discharge Summary (Signed)
NAMELEVA, KAMINSKI               ACCOUNT NO.:  [id number]   MEDICAL RECORD NO.:  [id number]          PATIENT TYPE:  INP   LOCATION:  5013                         FACILITY:  [facility]   PHYSICIAN:  Astrid Divine. Marcelino Scot, M.D. DATE OF BIRTH:  1932/09/03   DATE OF ADMISSION:  12/05/2005  DATE OF DISCHARGE:  12/13/2005                                 DISCHARGE SUMMARY   ADMISSION DIAGNOSES:  1.  Right femur fracture.  2.  Left distal radius fracture.   SECONDARY DIAGNOSES:  1.  Status post total knee arthroplasty on the right within the past month.  2.  History of hypertension.  3.  History of esophageal reflux.  4.  History of osteoarthritis.  5.  History of insomnia.  6.  History of obesity.  7.  History of hyperlipidemia.  8.  History of chronic obstructive pulmonary disease.   DISCHARGE DIAGNOSES:  1.  Right femur fracture.  2.  Left distal radius fracture.  3.  Status post total knee arthroplasty on the right within the past month.  4.  History of hypertension.  5.  History of esophageal reflux.  6.  History of osteoarthritis.  7.  History of insomnia.  8.  History of obesity.  9.  History of hyperlipidemia.  10. History of chronic obstructive pulmonary disease.   OPERATION/PROCEDURE:  1.  Open reduction, internal fixation left wrist distal radius fracture.  2.  Intermedullary nailing right femur fracture with __________ 10 x 360      statically locked nail on December 10, 2005.   CONSULTATIONS:  Stroke team was consulted for evaluation and management.   BRIEF HISTORY:  The patient is a 77 year old white female who was Monday  status post right total knee replacement by Dr. Noemi Chapel when she fell walking  in her driveway. She has a history of TIAs two weeks ago. She has a history  of increased cholesterol, GERD, osteoarthritis, insomnia, status post  cholecystectomy, C-section. She was admitted through the emergency room for  pain control. Dr. Altamese Dodson was consulted for  evaluation and treatment  of right femur fracture and left distal radius fracture. The stroke team was  also consulted for evaluation of her recent TIAs and anticoagulation therapy  recommendations. On hospital day #2, the patient was doing well, right lower  extremity was in traction, neurovascularly intact, left lower extremity in a  splint stable, neurovascularly intact distally. Placed on Lovenox because of  recent TIAs. Arrangements were made to take her to the operating room per  Dr. Marcelino Scot for treatment of these fractures when medically stable. Risks,  complications, and postoperative expectations explained to the patient in  detail. Stroke team saw the patient and felt that she was at a comfortable  surgical risk to proceed forward with right femur and left wrist surgery.  The patient remained comfortable during her hospitalization. Arrangements  were made to take her to the operating room on March 19th. She was NPO after  midnight. All permits were obtained. INR was 1.0. She was ready and stable  for surgery. On December 10, 2005, underwent ORIF of  left distal radius  fracture and IM nailing of right femur. She tolerated these procedures well  under general anesthesia. She was continued on her home medications.  Restarted on Coumadin postoperatively. Active range of motion and passive  range of motion of the hip and knee were started in the right lower  extremity. The left upper extremity was stable in the splint.  Postoperatively, she was comfortable, afebrile, vital signs stable,  incisions were fine. Dressings were changed. Labs were within normal limits.  Discharge planning was arranged. Physical therapy and occupational therapy  to work on extension of the knee. She was ready for discharge to rehab on  December 13, 2005. She is continued on Coumadin with INR of 1.3, continue  physical therapy and occupational therapy, continue with her home  medications.  Discontinue her PCA pump.  Hep-lock her IV.   PERTINENT LABORATORIES:  Admission WBC 10.0, hematocrit 29.0, hemoglobin  9.7.  At the time of discharge WBC 4.9, hemoglobin 7.5, hematocrit 30.8,  platelet count 367,000. PT at the time of discharge was 16.5 with an INR of  1.3, on Coumadin therapy targeting to be between 2 and 3 therapeutic.  Continue per pharmacy protocol. At the time of discharge, potassium was 4.1,  sodium 138. All of his chemistry indices can be obtained from the hospital  record.   DISCHARGE CONDITION:  Stable and improved.   DISCHARGE MEDICATIONS:  1.  Norvasc 10 mg p.o. daily.  2.  Ventolin one puff q.6h. p.r.n.  3.  Flovent two puffs b.i.d.  4.  Colace 100 mg p.o. b.i.d.  5.  Temazepam 50 mg p.o. q.h.s.  6.  Atenolol 50 mg p.o. daily.  7.  Nexium 40 mg p.o. daily.  8.  Alprazolam 0.5 mg t.i.d. p.r.n.  9.  Gemfibrozil 600 mg b.i.d.  10. Coumadin 7.5 mg to be dosed per pharmacy.   To contact our office if any questions or concerns. Will follow during the  rehab stay and then she will be followed as an outpatient per team.      Merlene Pulling. Toney Sang.      Astrid Divine. Marcelino Scot, M.D.  Electronically Signed   SCI/MEDQ  D:  01/25/2006  T:  01/26/2006  Job:  [id number]

## 2011-02-09 NOTE — H&P (Signed)
[name], [name]               ACCOUNT NO.:  [id number]   MEDICAL RECORD NO.:  [id number]          PATIENT TYPE:  INP   LOCATION:  [id number]                         FACILITY:  [facility]   PHYSICIAN:  Princess Bruins. Hickling, M.D.DATE OF BIRTH:  January 04, 1932   DATE OF ADMISSION:  11/29/2005  DATE OF DISCHARGE:                                HISTORY & PHYSICAL   CHIEF COMPLAINT:  Numb right face, hand, and arm.   HISTORY OF PRESENT ILLNESS:  77 year old right-handed widowed woman who had  two two-minute episodes of paraesthesias of her right lower face, hand, and  forearm without other focal symptoms and with complete resolution occurring  at 1:30 p.m. and 4:15 p.m.  Patient was brought to the emergency room where  she was evaluated.  I was asked to see her and agreed to admit her to  evaluate possible TIA of the left brain.   PAST MEDICAL HISTORY:  1.  Hypertension.  2.  Dyslipidemia.  3.  Gastroesophageal reflux disease.  4.  Osteoarthritis.  5.  Insomnia.   PAST SURGICAL HISTORY:  1.  Cholecystectomy.  2.  Cesarean section.  3.  Right total knee (in the past week).   MEDICATIONS:  1.  Norvasc 10 mg once daily.  2.  Gemfibrozil 600 mg twice daily.  3.  Alprazolam 0.5 mg three times daily as needed.  4.  Nexium 40 mg every morning.  5.  Hydrocodone.  6.  Acetaminophen one to two every four hours as needed.  7.  Restoril 15 mg at bedtime as needed.  8.  Robaxin 500 mg one to two every four to six hours as needed.  9.  Atenolol 50 mg once a day.  10. Warfarin 7.5 mg today, 5 mg daily starting tomorrow, November 30, 2005.   ALLERGIES:  HYDROCHLOROTHIAZIDE/TRIAMTERENE (hives), PERCOCET (itching),  ZOCOR (intractable vomiting).   FAMILY HISTORY:  Positive for stroke in her mother who had several mini  strokes and a couple of larger strokes in her 36s and 50s, but did not die  of stroke.  Multiple family members with heart disease.   SOCIAL HISTORY:  Patient does not use tobacco or  alcohol.  She lives alone  and she is fully independent.  She has seven children and multiple  grandchildren.   REVIEW OF SYSTEMS:  Patient is obese.  She has good appetite.  She has some  difficulty falling asleep.  HEENT:  No otitis media, pharyngitis, sinusitis,  or epistaxis.  PULMONARY:  Patient has asthma and sometimes shortness of  breath.  No active lung problems.  CARDIOVASCULAR:  No palpitations, heart  murmur, or angina.  No prior MI.  GASTROINTESTINAL:  No nausea, vomiting,  diarrhea.  GENITOURINARY:  No urinary tract infection or hematuria.  MUSCULOSKELETAL:  No fractures or deformities, recent total knee replacement  secondary to osteoarthritis.  SKIN:  The patient has some stasis changes in  her legs.  HEMATOLOGIC:  No anemia, bruisability, lymphadenopathy.  ENDOCRINE:  No diabetes or thyroid disease.  ALLERGY/IMMUNOLOGY:  No known  environmental allergies.  NEURO/PSYCH:  No depression, anxiety.  NEUROLOGIC:  No diplopia, dysarthria, dysphagia, weakness, loss of bowel and bladder  control, seizures.  12-system review is otherwise negative.   PHYSICAL EXAMINATION:  VITAL SIGNS:  Blood pressure 130/59, resting pulse  79, respirations 16, temperature 98.8, oxygen saturation 99%.  HEENT:  No bruits.  LUNGS:  Clear.  HEART:  No murmurs.  Pulses normal.  ABDOMEN:  Soft.  Bowel sounds normal.  No hepatosplenomegaly.  Abdomen is  protuberant.  EXTREMITIES:  Healing right total knee replacement without edema or  cyanosis.  NEUROLOGIC:  NIH stroke scale was 3.  She was not able to lift her right  knee due to surgery but she can move it.  Mental status:  Alert, oriented.  No dysphagia, dyspraxia.  Cranial nerves:  Round, reactive pupils.  Visual  fields full.  Extraocular movements full.  Symmetric facial strength.  Midline tongue and uvula.  Air conduction greater than bone conduction  bilaterally.  Motor examination:  Normal strength in the right and left arms  and left leg.   In the distal right leg I cannot test proximally due to her  surgery.  Fine motor movements were normal without drift, sensation, without  numbness or tingling.  Good stereoagnosis.  Cerebellar examination:  Good  finger to nose, rapid repetitive movements.  Gait not tested.  Reflexes were  diminished.  Patient had bilateral flexor plantar responses.   IMPRESSION:  Numbness right side (782.0).  Need to evaluate the patient for  transient ischemic attack of the left brain.  Patient will have MRI of the  brain without contrast, MRA intracranial, 2-D echocardiogram, carotid  Dopplers, transcranial Doppler, and morning time fasting lipid panel,  hemoglobin A1c, and serum homocysteine.  She is on Warfarin which has not  been fully adjusted.  We will place her on Lovenox for deep venous  thrombosis prophylaxis and continue the Warfarin.      Princess Bruins. Gaynell Face, M.D.  Electronically Signed     WHH/MEDQ  D:  11/29/2005  T:  11/30/2005  Job:  [id number]   cc:   Herbie Baltimore A. Noemi Chapel, M.D.  Fax: [phone]   Nathaniel Man, M.D.  [place], [place]

## 2011-02-09 NOTE — Discharge Summary (Signed)
[name], [name] NO.:  [id number]   MEDICAL RECORD NO.:  [id number]          PATIENT TYPE:  IPS   LOCATION:  4005                         FACILITY:  [facility]   PHYSICIAN:  Jarvis Morgan, M.D.   DATE OF BIRTH:  September 17, 1932   DATE OF ADMISSION:  12/13/2005  DATE OF DISCHARGE:  12/17/2005                                 DISCHARGE SUMMARY   DISCHARGE DIAGNOSES:  1.  Right midshaft femur fracture with displacement and distal left radius      fracture requiring nailing.  2.  Hypertension.  3.  Dyslipidemia.  4.  Anxiety disorder.  5.  Acute blood loss anemia.  6.  History of asthmatic bronchitis.   HISTORY OF PRESENT ILLNESS:  [name] is a 77 year old female with a  history of hypertension, right total knee replacement on March 5, recent TIA  event on March 7, and recent fall on March 14 with a femur fracture with  displacement and distal left radius fracture.  The patient was placed in  traction initially and had been cleared by neuro.  Dr. Leonie Man cleared the  patient and recommended starting aspirin once Coumadin discontinued.  On  December 10, 2005, the patient underwent IM nailing, right femur and ORIF of  left distal radius fracture by Dr. Marcelino Scot.  Postop is nonweightbearing left  upper extremity and right lower extremity.  Coumadin was resumed for DVT  prophylaxis.  Therapy is initiated on the patient and is noted to have  impairments in mobility in mobility and self care.  Rehab consulted for  further therapies.   PAST MEDICAL HISTORY:  Significant for hypertension, right total knee  replacement on March 5, GERD, obesity, TIA on March 7, cholecystectomy,  dyslipidemia, asthmatic bronchitis, history of C-section, and anxiety  disorder.   ALLERGIES:  OXYCODONE, ZOCOR, HYDROCHLOROTHIAZIDE, LISINOPRIL.   FAMILY HISTORY:  Positive for diabetes and coronary artery disease.   SOCIAL HISTORY:  The patient lives alone in one level home with ramp at  entry, was  independent with walker secondary to recent knee replacement.  The family can assist at discharge.  The patient quit tobacco 30 years ago.  Does not use any alcohol.   HOSPITAL COURSE:  [name] was admitted to rehab on December 13, 2005  for inpatient therapies to consist of PT/OT daily.  Past admission she was  maintained on subcu Lovenox until INR therapeutic basis.  The patient with  poor pain control and reported still using IV morphine at the time of  admission.  This was discontinued.  OxyContin was increased to 40 mg q/12h.  and Robaxin was scheduled at 500 mg q.i.d. for relief from muscle spasms.  Overall this with p.r.n. Norco has provided good pain relief.  Iron  supplement was added for acute blood loss anemia.  Repeat CBC past admission  shows improvement with H&H at 11.1, 32.7.  Electrolytes revealed sodium 139,  potassium 4.0, chloride 102, CO2 28, BUN 15, creatinine 0.8, glucose 106.  Albumin low at 2.4.  The patient was started on Lipitor and acute services.  She was maintained  on this at the time of discharge.  She is to monitor for  any side effects secondary to history of intolerance of Zocor.  She is aware  of this and will contact her MD for any signs or symptoms of muscle aches,  pains, or fatigue.   The patient's right thigh was noted to have moderate edema with three small  sutures in place.  The left upper extremity was in splint and  neurovascularly intact.  The sutures in right thigh were discontinued at the  time of discharge.  She continues with splint to the left upper extremity.  A __________ was done on past admission, this was inactive.  However, on  March 25, the patient with complaints of dysuria and foul odor.  A repeat UA  shows 7-10 WBCs with urine culture pending.  The patient was started on  Cipro prophylactically, cultures currently pending.  We will contact the  patient if urine cultures shows resistant bacteria.  The patient has been   progressing therapy.  However, on March 23, insurance expressed concerns  about the need for the patient to continue to stay on rehab as she has  family support.  Family education was carried out and currently the patient  is at close supervision from sit to stand, transfers minimal assist,  platform rolling walker to ambulate 50 feet x2 being nonweightbearing in the  right lower extremity and left upper extremity.  Tends to lean forward for  dynamic balance.  A wheelchair was ordered for use at home.  The patient is  set up minimal assist for self care needs.  The family will continue to  provide assistance past discharge.  The patient also to continue with  further follow up of home health PT/OT and by advanced home care past  discharge.  A home health RN has been arranged for pro time draws.  The  patient's INR is therapeutic at 2.3 at the time of discharge.  The patient  is to continue on 5 mg of Coumadin a day with the next pro time to be  checked on March 28.  On December 17, 2005, the patient is discharged to home.   DISCHARGE MEDICATIONS:  1.  Hydrocodone 10/325 one p.o. q.6-8h. p.r.n. pain.  2.  OxyContin CR 20 mg two p.o. b.i.d. x5 days to be tapered over the next      two weeks.  3.  Norvasc 10 mg a day.  4.  Lopid 600 mg b.i.d.  5.  Nexium 40 mg a day.  6.  Flovent 220 mcg one puff b.i.d.  7.  Tenormin 50 mg/d.  8.  Xanax 0.5 mg t.i.d.  9.  Robaxin 500 mg p.o. q.i.d.  10. Lipitor 10 mg p.o. q.h.s.  11. __________ p.o. b.i.d.  12. Cipro 250 mg b.i.d.   DISCHARGE ACTIVITIES:  Ambulate only with assistance.  No weight lift of  forearm and right lower extremity.  No driving.  No strenuous activity for  now.   DISCHARGE DIET:  Regular.   WOUND CARE:  Keep area clean and dry.   FOLLOWUP:  The patient is to follow up with Dr. Marcelino Scot in one week.  Follow  up with LMD for routine medical care.  Follow up with Dr. Jonette Eva as  needed.  SPECIAL INSTRUCTIONS:  Advance gait  per PT/OT p.r.n.  Next pro time draw  March 28.  Coumadin to continue through April 14.  Aspirin one p.o. per day  past April 14  for CVA prophylaxis.      Thornton Dales, P.A.    ______________________________  Jarvis Morgan, M.D.    PP/MEDQ  D:  12/17/2005  T:  12/19/2005  Job:  [id number]   cc:   Astrid Divine. Marcelino Scot, M.D.  Fax: [place]  Fax: [phone]

## 2011-02-09 NOTE — Consult Note (Signed)
[name], [name]               ACCOUNT NO.:  [id number]   MEDICAL RECORD NO.:  [id number]          PATIENT TYPE:  INP   LOCATION:  5013                         FACILITY:  [facility]   PHYSICIAN:  Pramod P. Leonie Man, MD    DATE OF BIRTH:  09-14-1932   DATE OF CONSULTATION:  DATE OF DISCHARGE:                                   CONSULTATION   REASON FOR REFERRAL:  TIA, evaluate for preoperative neurological clearance.   [name] is a 77 year old Caucasian lady who was initially admitted to  the stroke service on November 28, 2005 for symptoms of sudden onset of right  face and hand paresthesias.  At that time, she had described tingling and  numbness in the right side of the cheeks, lips as well as involving the  fingertips which lasted only a few minutes and occurred twice 2-3 hours  apart.  She was admitted to [facility].  Telemetry monitoring did  not reveal cardiac arrhythmias.  MRI scan of the brain did not reveal acute  infarct, showed mild generalized atrophy and white matter changes.  MRA of  the intracranial circulation showed only a mild atheromatous irregularity  without high-grade stenosis.  Carotid ultrasound did not reveal high-grade  stenosis.  2-D echocardiogram was done as an outpatient and is therefore not  available.  Hemoglobin A1c and homocystine were normal.  Total cholesterol  was 190, LDL was 110.  The patient had previously had knee surgery and had  been placed on Coumadin which was continued.  The patient had also fallen  down onto the knee and fractured her right femur and left hand and she is  presently readmitted for elective surgery and we have been asked to consult  to give preoperative neurological clearance.   The patient's vascular risk factors include hypertension, obesity,  hyperlipidemia.  No prior history of any other stroke, TIA, migraine  headache, seizures or other neurological problems.   PAST MEDICAL HISTORY:  As stated above.   HOME  MEDICATIONS:  Lasix, gemfibrozil, Xanax, Nexium, atenolol, temazepam,  Colace, Flovent, Ventolin, Norvasc, Coumadin.   MEDICATION ALLERGIES:  NONE LISTED.   REVIEW OF SYSTEMS:  As stated above.  Positive for fall, knee pain,  fracture, numbness, tingling.   PHYSICAL EXAMINATION:  Reveals an obese middle-aged Caucasian lady who is  not in distress.  She is afebrile with pulse rate of 83 per minute, regular,  blood pressure 140/67, respiratory rate 18 per minute.  Distal pulses were  felt.  HEAD:  Atraumatic.  NECK:  Supple without bruit.  ENT:  Exam unremarkable.  CARDIAC:  No murmur or gallop.  LUNGS:  Clear to auscultation.  NEUROLOGIC:  She is awake, alert, oriented x3.  Normal speech and language  function.  There is no aphasia, apraxia, or dysarthria.  Eye movements are  full range, no nystagmus.  Face is symmetric bilaterally. Movements are  normal.  Tongue is midline.  Motor and sensory exam limited because of the  patient has right knee bandage as well as left hand bandage and she is  getting blood transfusion in  the right forearm.  However, she moves all four  extremities equally.  There is no focal deficits.  Sensation is grossly  preserved.   DATA REVIEWED TODAY:  Hemoglobin is low at 9, hematocrit is low at 26.5.  White count and platelets are normal.  Electrolytes are normal.  INR is 1.5.  MRI scan of the brain dated November 28, 2005 shows no acute infarct and only  mild generalized atrophy and white matter changes.  MRA of the brain reveals  mild atheromatous changes.  LDL is 110, total cholesterol is 190.  Hemoglobin A1c and homocystine are normal.   IMPRESSION:  A 77 year old lady with two brief episodes of right face and  hand paresthesias of unclear etiology.  I doubt this is TIA and that she is  at high risk for recurrence.  Neurovascular evaluation has been fairly  unyielding.   PLAN:  I would recommend elective heparin, elbow surgery as indicated;  however,  maintain adequate hydration status and systolic blood pressure  between 120-150, 140 in the perioperative period, as well as maintain  hematocrit greater than 30.  Add Zocor 20 mg a day to aim for an LDL goal  below 100.  Postoperatively when she is ambulating, I would also recommend  she go on a low-fat diet and lose weight.  After Coumadin is discontinued in  the postoperative period, may switch to aspirin.  I had a long discussion  with the patient and multiple family members and answered all of their  questions.           ______________________________  Kathie Rhodes. Leonie Man, MD     PPS/MEDQ  D:  12/07/2005  T:  12/09/2005  Job:  [id number]

## 2011-03-23 ENCOUNTER — Other Ambulatory Visit (HOSPITAL_COMMUNITY): Payer: Self-pay | Admitting: Sports Medicine

## 2011-03-23 DIAGNOSIS — T84039A Mechanical loosening of unspecified internal prosthetic joint, initial encounter: Secondary | ICD-10-CM

## 2011-04-03 ENCOUNTER — Encounter (HOSPITAL_COMMUNITY)
Admission: RE | Admit: 2011-04-03 | Discharge: 2011-04-03 | Disposition: A | Discharge status: Home / Self Care | Payer: Medicare Other | Source: Ambulatory Visit | Attending: Sports Medicine | Admitting: Sports Medicine

## 2011-04-03 DIAGNOSIS — Z96659 Presence of unspecified artificial knee joint: Secondary | ICD-10-CM | POA: Insufficient documentation

## 2011-04-03 DIAGNOSIS — M25569 Pain in unspecified knee: Secondary | ICD-10-CM | POA: Insufficient documentation

## 2011-04-03 DIAGNOSIS — T84039A Mechanical loosening of unspecified internal prosthetic joint, initial encounter: Secondary | ICD-10-CM

## 2011-04-03 MED ORDER — TECHNETIUM TC 99M MEDRONATE IV KIT
23.3000 | PACK | Freq: Once | INTRAVENOUS | Status: AC | PRN
  Administered 2011-04-03: 23 via INTRAVENOUS

## 2011-05-04 ENCOUNTER — Encounter (HOSPITAL_COMMUNITY)
Admission: RE | Admit: 2011-05-04 | Discharge: 2011-05-04 | Disposition: A | Discharge status: Home / Self Care | Payer: Medicare Other | Source: Ambulatory Visit | Attending: Orthopedic Surgery | Admitting: Orthopedic Surgery

## 2011-05-04 LAB — BASIC METABOLIC PANEL
BUN: 22 mg/dL (ref 6–23)
CO2: 29 mEq/L (ref 19–32)
Calcium: 9.4 mg/dL (ref 8.4–10.5)
Creatinine, Ser: 1.03 mg/dL (ref 0.50–1.10)
GFR calc non Af Amer: 52 mL/min — ABNORMAL LOW (ref >60)
Glucose, Bld: 106 mg/dL — ABNORMAL HIGH (ref 70–99)
Sodium: 141 mEq/L (ref 135–145)

## 2011-05-04 LAB — URINALYSIS, ROUTINE W REFLEX MICROSCOPIC
Glucose, UA: NEGATIVE mg/dL
Hgb urine dipstick: NEGATIVE
Specific Gravity, Urine: 1.02 (ref 1.005–1.030)
pH: 5 (ref 5.0–8.0)

## 2011-05-04 LAB — URINE MICROSCOPIC-ADD ON

## 2011-05-04 LAB — SURGICAL PCR SCREEN: Staphylococcus aureus: NEGATIVE

## 2011-05-04 LAB — DIFFERENTIAL
Basophils Relative: 0 % (ref 0–1)
Lymphs Abs: 2.1 10*3/uL (ref 0.7–4.0)
Monocytes Absolute: 0.4 10*3/uL (ref 0.1–1.0)
Monocytes Relative: 5 % (ref 3–12)
Neutro Abs: 4.2 10*3/uL (ref 1.7–7.7)
Neutrophils Relative %: 62 % (ref 43–77)

## 2011-05-04 LAB — CBC
Hemoglobin: 12.3 g/dL (ref 12.0–15.0)
MCH: 28.9 pg (ref 26.0–34.0)
MCHC: 33.1 g/dL (ref 30.0–36.0)
MCV: 87.5 fL (ref 78.0–100.0)
RBC: 4.25 MIL/uL (ref 3.87–5.11)

## 2011-05-04 LAB — PROTIME-INR: Prothrombin Time: 12.3 seconds (ref 11.6–15.2)

## 2011-05-04 LAB — TYPE AND SCREEN: ABO/RH(D): A POS

## 2011-05-14 ENCOUNTER — Inpatient Hospital Stay (HOSPITAL_COMMUNITY)
Admission: RE | Admit: 2011-05-14 | Discharge: 2011-05-17 | DRG: 468 | Disposition: A | Discharge status: Home Health | Payer: Medicare Other | Source: Ambulatory Visit | Attending: Orthopedic Surgery | Admitting: Orthopedic Surgery

## 2011-05-14 ENCOUNTER — Inpatient Hospital Stay (HOSPITAL_COMMUNITY): Payer: Medicare Other

## 2011-05-14 DIAGNOSIS — I10 Essential (primary) hypertension: Secondary | ICD-10-CM | POA: Diagnosis present

## 2011-05-14 DIAGNOSIS — Z7901 Long term (current) use of anticoagulants: Secondary | ICD-10-CM

## 2011-05-14 DIAGNOSIS — Z96659 Presence of unspecified artificial knee joint: Secondary | ICD-10-CM

## 2011-05-14 DIAGNOSIS — Y831 Surgical operation with implant of artificial internal device as the cause of abnormal reaction of the patient, or of later complication, without mention of misadventure at the time of the procedure: Secondary | ICD-10-CM | POA: Diagnosis present

## 2011-05-14 DIAGNOSIS — Z79899 Other long term (current) drug therapy: Secondary | ICD-10-CM

## 2011-05-14 DIAGNOSIS — J45909 Unspecified asthma, uncomplicated: Secondary | ICD-10-CM | POA: Diagnosis present

## 2011-05-14 DIAGNOSIS — K279 Peptic ulcer, site unspecified, unspecified as acute or chronic, without hemorrhage or perforation: Secondary | ICD-10-CM | POA: Diagnosis present

## 2011-05-14 DIAGNOSIS — T84039A Mechanical loosening of unspecified internal prosthetic joint, initial encounter: Principal | ICD-10-CM | POA: Diagnosis present

## 2011-05-15 LAB — BASIC METABOLIC PANEL
BUN: 13 mg/dL (ref 6–23)
Calcium: 9.1 mg/dL (ref 8.4–10.5)
GFR calc Af Amer: >60 mL/min (ref >60)
GFR calc non Af Amer: >60 mL/min (ref >60)
Glucose, Bld: 152 mg/dL — ABNORMAL HIGH (ref 70–99)
Sodium: 138 mEq/L (ref 135–145)

## 2011-05-15 LAB — CBC
HCT: 36.1 % (ref 36.0–46.0)
MCV: 88.9 fL (ref 78.0–100.0)
Platelets: 194 10*3/uL (ref 150–400)
RBC: 4.06 MIL/uL (ref 3.87–5.11)
RDW: 13.2 % (ref 11.5–15.5)
WBC: 7.1 10*3/uL (ref 4.0–10.5)

## 2011-05-15 LAB — PROTIME-INR: INR: 1.01 (ref 0.00–1.49)

## 2011-05-16 LAB — PROTIME-INR
INR: 1.12 (ref 0.00–1.49)
Prothrombin Time: 14.6 seconds (ref 11.6–15.2)

## 2011-05-16 LAB — CBC
MCHC: 32.1 g/dL (ref 30.0–36.0)
Platelets: 146 10*3/uL — ABNORMAL LOW (ref 150–400)
RDW: 13.1 % (ref 11.5–15.5)

## 2011-05-17 LAB — CBC
Platelets: 164 10*3/uL (ref 150–400)
RBC: 3.37 MIL/uL — ABNORMAL LOW (ref 3.87–5.11)
RDW: 13.4 % (ref 11.5–15.5)
WBC: 4.3 10*3/uL (ref 4.0–10.5)

## 2011-05-17 LAB — BODY FLUID CULTURE: Culture: NO GROWTH

## 2011-05-17 LAB — PROTIME-INR: Prothrombin Time: 18 seconds — ABNORMAL HIGH (ref 11.6–15.2)

## 2011-05-21 NOTE — Op Note (Signed)
NAMEEDISON, Kristine NO.:  [id number]  MEDICAL RECORD NO.:  [id number]  LOCATION:  [id number]                         FACILITY:  [place]  PHYSICIAN:  Kathalene Frames. Mayer Camel, M.D.   DATE OF BIRTH:  09-10-1932  DATE OF PROCEDURE:  05/14/2011 DATE OF DISCHARGE:                              OPERATIVE REPORT   PREOPERATIVE DIAGNOSIS:  Loose tibial component, right total knee, DePuy sigma RP.  POSTOPERATIVE DIAGNOSIS:  Loose tibial component, right total knee, DePuy sigma RP.  PROCEDURE:  Revision by right total knee arthroplasty with removal of a loose #3 tibial component and revision to a DePuy #4 MBT tray with a 65 x 14 stem cemented and a new 15-mm bearing.  SURGEON:  Kathalene Frames. Mayer Camel, MD  FIRST ASSISTANT:  Leafy Kindle, PA-C.  ANESTHETIC:  General endotracheal and femoral nerve block on the right.  ESTIMATED BLOOD LOSS:  Minimal.  FLUID REPLACEMENT:  1500 mL crystalloid.  DRAINS PLACED:  Foley catheter, 2 medium Hemovacs.  URINE OUTPUT:  About 300 mL.  INDICATIONS FOR PROCEDURE:  A 77 year old woman who underwent primary right total knee arthroplasty back in 2007 with Dr. Noemi Chapel.  In interval she had a femur fracture that Dr. Noemi Chapel rodded successfully and then she went on to develop loosening of the tibial component this year. Plain radiographs showed subsidence of the tibial component through the tibia and I saw in consultation for this predicament.  Her pain is severe, disabling, and prevents her from activities of daily living and she desires elective revision of right total knee arthroplasty.  She has not had any fevers, chills, or anything giving evidence to infection, but we will certainly check that as well.  Dr. Noemi Chapel should receive a copy of this note.  DESCRIPTION OF PROCEDURE:  The patient identified by armband, received preoperative IV antibiotics in the holding area at [facility], also underwent right femoral nerve block, and then she  was taken to operating room #5.  Appropriate site monitors were attached and endotracheal anesthesia induced with the patient in supine position. Foot positioner lateral post applied to the table.  Tourniquet applied high to the right lower extremity.  Right lower extremity prepped and draped in usual sterile fashion from the ankle to the tourniquet.  Time- out procedure performed.  Limb wrapped with an Esmarch bandage translated 350 mmHg with the knee bent and we began the operation itself by recreating the old anterior knee incision starting handbreadth above the patella going over the patella and then 1 cm medial, 5 cm distal to the tibial tubercle.  Small bleeders, skin, and subcutaneous tissue identified and cauterized.  Transverse retinaculum reflected medially allowing recreation of the medial parapatellar arthrotomy and we also removed the old Ethibond sutures from the index procedure.  The fluid appeared normal.  The synovium was not inflamed.  Gram stain and cultures were sent, however.  We then performed anterior synovectomy and removal of scar tissue allowing [place] to elevate the superficial medial collateral ligament from anterior to posterior off the proximal tibia and gradually evert the patella.  The tibial component was obviously subsided and grossly loose.  We checked the femoral component with a  femoral inserter extractor.  It was well-fixed as was the patellar component.  The knee was then hyperflexed with the patella everted.  The 12.5-mm bearing on the #3 tray was removed and the tray itself was noted to be loose.  We then carefully worked around the edges of the tray with a quarter inch osteotome getting it elevated and then extracted without too much difficulty.  With the knee hyperflexed and externally rotated, we then carefully outlined the edges of the remaining tibia.  This appeared to be pretty much a contained defect with good edges except for the posterior  medial corner of the tibia where there was some slight softening of the bone.  We then removed the rest of the cement from the proximal tibia and down the shaft and then sequentially reamed up to a 14-mm reamer for the correct depth for a 65-mm stem.  We then sized for #4 tibial base plate, pinned this in place, did our conical reaming, and removed the tibial baseplate.  We then set up a trial with a #4 base and a 14 stem.  This was inserted without too much difficulty as well as the Delta fin keel punch.  We then inserted 12.5 and 15 bearings, reduced the knee and checked her ligamentous stability and had full extension and good stability with the 15 bearing.  It was a 2.5 femur with a 4 tibial baseplate, but the 2.5 tibial bearing did fit inside the #4 baseplate without difficulty and provided the best support.  At this point, the trials were removed.  All bony surfaces were water picked clean.  We continued to remove the scar tissue from around the proximal tibia and placed some small drill holes in the bone for cement fixation. A double batch of DePuy 1 cement was then mixed with 1500 mg of Zinacef and applied to the #4 real tibial baseplate down to the level of the stem which had been screwed into place and cement was also applied to the proximal tibia.  We then hammered into place the real component and removed excess cement, inserted the 15 mm bearing for the 2.5 femur and reduced the knee.  The knee was held in extension as the cement cured. Medium Hemovac drains were placed from anterolateral approach and after the cement had cured and we removed any excess cement, we checked our range of motion and tracking and found it to be excellent.  Ligamentous stability was also excellent.  The wound was irrigated out one more time with pulse lavage normal saline solution and closed in layers with running #1 Vicryl suture in the arthrotomy, 0-0 and 2-0 undyed Vicryl suture in the  subcutaneous tissue and skin staples.  A dressing of Xeroform, 4x4 dressing sponges, Webril, and Ace wrap was applied. Tourniquet was let down for a total tourniquet time of 2 hours and 5 minutes.  The patient was then awakened, extubated, and taken to the recovery room without difficulty.     Kathalene Frames. Mayer Camel, M.D.     [name]  D:  05/14/2011  T:  05/14/2011  Job:  [id number]  cc:   Herbie Baltimore A. Noemi Chapel, M.D.  Electronically Signed by Frederik Pear M.D. on 05/21/2011 01:10:48 PM
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
## 2011-07-03 ENCOUNTER — Inpatient Hospital Stay: Admit: 2011-07-03 | Payer: Self-pay | Admitting: Orthopedic Surgery

## 2011-07-03 ENCOUNTER — Inpatient Hospital Stay (HOSPITAL_COMMUNITY)
Admission: EM | Admit: 2011-07-03 | Discharge: 2011-07-05 | DRG: 561 | Disposition: A | Discharge status: Home Health | Payer: Medicare Other | Attending: Orthopedic Surgery | Admitting: Orthopedic Surgery

## 2011-07-03 DIAGNOSIS — J45909 Unspecified asthma, uncomplicated: Secondary | ICD-10-CM | POA: Diagnosis present

## 2011-07-03 DIAGNOSIS — Z79899 Other long term (current) drug therapy: Secondary | ICD-10-CM

## 2011-07-03 DIAGNOSIS — Z8711 Personal history of peptic ulcer disease: Secondary | ICD-10-CM

## 2011-07-03 DIAGNOSIS — B999 Unspecified infectious disease: Secondary | ICD-10-CM | POA: Diagnosis present

## 2011-07-03 DIAGNOSIS — T8450XA Infection and inflammatory reaction due to unspecified internal joint prosthesis, initial encounter: Principal | ICD-10-CM | POA: Diagnosis present

## 2011-07-03 DIAGNOSIS — Y838 Other surgical procedures as the cause of abnormal reaction of the patient, or of later complication, without mention of misadventure at the time of the procedure: Secondary | ICD-10-CM | POA: Diagnosis present

## 2011-07-03 DIAGNOSIS — I10 Essential (primary) hypertension: Secondary | ICD-10-CM | POA: Diagnosis present

## 2011-07-03 DIAGNOSIS — Z96659 Presence of unspecified artificial knee joint: Secondary | ICD-10-CM

## 2011-07-03 LAB — URINALYSIS, ROUTINE W REFLEX MICROSCOPIC
Bilirubin Urine: NEGATIVE
Glucose, UA: NEGATIVE mg/dL
Ketones, ur: NEGATIVE mg/dL
Leukocytes, UA: NEGATIVE
Protein, ur: NEGATIVE mg/dL
pH: 5.5 (ref 5.0–8.0)

## 2011-07-03 LAB — BASIC METABOLIC PANEL
Calcium: 9.6 mg/dL (ref 8.4–10.5)
Creatinine, Ser: 0.96 mg/dL (ref 0.50–1.10)
GFR calc non Af Amer: 56 mL/min — ABNORMAL LOW (ref >90)
Glucose, Bld: 121 mg/dL — ABNORMAL HIGH (ref 70–99)
Sodium: 141 mEq/L (ref 135–145)

## 2011-07-03 LAB — CBC
Platelets: 230 10*3/uL (ref 150–400)
RBC: 3.74 MIL/uL — ABNORMAL LOW (ref 3.87–5.11)
RDW: 14.5 % (ref 11.5–15.5)
WBC: 6.5 10*3/uL (ref 4.0–10.5)

## 2011-07-03 LAB — SEDIMENTATION RATE: Sed Rate: 48 mm/hr — ABNORMAL HIGH (ref 0–22)

## 2011-07-03 LAB — APTT: aPTT: 36 seconds (ref 24–37)

## 2011-07-03 LAB — PROTIME-INR: Prothrombin Time: 13.6 seconds (ref 11.6–15.2)

## 2011-07-04 ENCOUNTER — Inpatient Hospital Stay (HOSPITAL_COMMUNITY): Payer: Medicare Other

## 2011-07-04 LAB — SEDIMENTATION RATE: Sed Rate: 43 mm/hr — ABNORMAL HIGH (ref 0–22)

## 2011-07-04 LAB — CBC
Hemoglobin: 11 g/dL — ABNORMAL LOW (ref 12.0–15.0)
MCH: 27.8 pg (ref 26.0–34.0)
MCHC: 32.8 g/dL (ref 30.0–36.0)
MCV: 84.8 fL (ref 78.0–100.0)
RBC: 3.95 MIL/uL (ref 3.87–5.11)

## 2011-07-05 LAB — CBC
HCT: 32.6 % — ABNORMAL LOW (ref 36.0–46.0)
Hemoglobin: 10.4 g/dL — ABNORMAL LOW (ref 12.0–15.0)
MCH: 27.2 pg (ref 26.0–34.0)
MCHC: 31.9 g/dL (ref 30.0–36.0)
MCV: 85.3 fL (ref 78.0–100.0)
RBC: 3.82 MIL/uL — ABNORMAL LOW (ref 3.87–5.11)

## 2011-09-10 ENCOUNTER — Inpatient Hospital Stay (HOSPITAL_COMMUNITY): Payer: Medicare Other

## 2011-09-10 ENCOUNTER — Other Ambulatory Visit: Payer: Self-pay

## 2011-09-10 ENCOUNTER — Inpatient Hospital Stay (HOSPITAL_COMMUNITY)
Admission: AD | Admit: 2011-09-10 | Discharge: 2011-09-16 | DRG: 467 | Disposition: A | Discharge status: Home Health | Payer: Medicare Other | Source: Ambulatory Visit | Attending: Orthopedic Surgery | Admitting: Orthopedic Surgery

## 2011-09-10 ENCOUNTER — Other Ambulatory Visit: Payer: Self-pay | Admitting: Orthopedic Surgery

## 2011-09-10 DIAGNOSIS — T8450XA Infection and inflammatory reaction due to unspecified internal joint prosthesis, initial encounter: Principal | ICD-10-CM | POA: Diagnosis present

## 2011-09-10 DIAGNOSIS — Y831 Surgical operation with implant of artificial internal device as the cause of abnormal reaction of the patient, or of later complication, without mention of misadventure at the time of the procedure: Secondary | ICD-10-CM | POA: Diagnosis present

## 2011-09-10 DIAGNOSIS — M009 Pyogenic arthritis, unspecified: Secondary | ICD-10-CM | POA: Diagnosis present

## 2011-09-10 DIAGNOSIS — Z96659 Presence of unspecified artificial knee joint: Secondary | ICD-10-CM

## 2011-09-10 DIAGNOSIS — J45909 Unspecified asthma, uncomplicated: Secondary | ICD-10-CM | POA: Diagnosis present

## 2011-09-10 DIAGNOSIS — T8459XA Infection and inflammatory reaction due to other internal joint prosthesis, initial encounter: Secondary | ICD-10-CM

## 2011-09-10 DIAGNOSIS — Z888 Allergy status to other drugs, medicaments and biological substances status: Secondary | ICD-10-CM

## 2011-09-10 DIAGNOSIS — E119 Type 2 diabetes mellitus without complications: Secondary | ICD-10-CM | POA: Diagnosis present

## 2011-09-10 DIAGNOSIS — I10 Essential (primary) hypertension: Secondary | ICD-10-CM | POA: Diagnosis present

## 2011-09-10 DIAGNOSIS — K279 Peptic ulcer, site unspecified, unspecified as acute or chronic, without hemorrhage or perforation: Secondary | ICD-10-CM | POA: Diagnosis present

## 2011-09-10 LAB — BASIC METABOLIC PANEL
Chloride: 106 mEq/L (ref 96–112)
GFR calc Af Amer: 48 mL/min — ABNORMAL LOW (ref >90)
GFR calc non Af Amer: 41 mL/min — ABNORMAL LOW (ref >90)
Potassium: 4.3 mEq/L (ref 3.5–5.1)

## 2011-09-10 LAB — URINALYSIS, MICROSCOPIC ONLY
Ketones, ur: NEGATIVE mg/dL
Leukocytes, UA: NEGATIVE
Nitrite: NEGATIVE
Specific Gravity, Urine: 1.021 (ref 1.005–1.030)
pH: 5.5 (ref 5.0–8.0)

## 2011-09-10 LAB — DIFFERENTIAL
Basophils Absolute: 0 10*3/uL (ref 0.0–0.1)
Basophils Relative: 0 % (ref 0–1)
Lymphocytes Relative: 20 % (ref 12–46)
Monocytes Absolute: 0.7 10*3/uL (ref 0.1–1.0)
Monocytes Relative: 9 % (ref 3–12)
Neutro Abs: 5.4 10*3/uL (ref 1.7–7.7)
Neutrophils Relative %: 70 % (ref 43–77)

## 2011-09-10 LAB — CBC
HCT: 33.3 % — ABNORMAL LOW (ref 36.0–46.0)
Hemoglobin: 10.4 g/dL — ABNORMAL LOW (ref 12.0–15.0)
RDW: 15.9 % — ABNORMAL HIGH (ref 11.5–15.5)
WBC: 7.7 10*3/uL (ref 4.0–10.5)

## 2011-09-10 LAB — SEDIMENTATION RATE: Sed Rate: 35 mm/hr — ABNORMAL HIGH (ref 0–22)

## 2011-09-10 LAB — APTT: aPTT: 30 seconds (ref 24–37)

## 2011-09-10 LAB — PROTIME-INR: INR: 1.09 (ref 0.00–1.49)

## 2011-09-10 MED ORDER — HYDROCODONE-ACETAMINOPHEN 10-325 MG PO TABS
1.0000 | ORAL_TABLET | Freq: Four times a day (QID) | ORAL | Status: DC | PRN
  Administered 2011-09-11 – 2011-09-16 (×13): 1 via ORAL
  Filled 2011-09-10 (×14): qty 1

## 2011-09-10 MED ORDER — VANCOMYCIN HCL 1000 MG IV SOLR
1250.0000 mg | INTRAVENOUS | Status: DC
  Administered 2011-09-11 – 2011-09-13 (×3): 1250 mg via INTRAVENOUS
  Filled 2011-09-10 (×3): qty 1250

## 2011-09-10 MED ORDER — DEXTROSE-NACL 5-0.45 % IV SOLN
INTRAVENOUS | Status: DC
  Administered 2011-09-10 – 2011-09-12 (×3): via INTRAVENOUS

## 2011-09-10 MED ORDER — GABAPENTIN 300 MG PO CAPS
300.0000 mg | ORAL_CAPSULE | Freq: Two times a day (BID) | ORAL | Status: DC
  Administered 2011-09-10 – 2011-09-16 (×12): 300 mg via ORAL
  Filled 2011-09-10 (×13): qty 1

## 2011-09-10 MED ORDER — ALPRAZOLAM 0.5 MG PO TABS
0.5000 mg | ORAL_TABLET | Freq: Every evening | ORAL | Status: DC | PRN
Start: 2011-09-10 — End: 2011-09-16
  Administered 2011-09-12 – 2011-09-15 (×4): 0.5 mg via ORAL
  Filled 2011-09-10 (×4): qty 1

## 2011-09-10 MED ORDER — AMLODIPINE BESYLATE 10 MG PO TABS
10.0000 mg | ORAL_TABLET | Freq: Every day | ORAL | Status: DC
  Administered 2011-09-11 – 2011-09-16 (×6): 10 mg via ORAL
  Filled 2011-09-10 (×7): qty 1

## 2011-09-10 MED ORDER — CLONIDINE HCL 0.1 MG PO TABS
0.1000 mg | ORAL_TABLET | Freq: Every day | ORAL | Status: DC
  Administered 2011-09-10 – 2011-09-15 (×6): 0.1 mg via ORAL
  Filled 2011-09-10 (×7): qty 1

## 2011-09-10 MED ORDER — FLUTICASONE PROPIONATE HFA 110 MCG/ACT IN AERO
1.0000 | INHALATION_SPRAY | Freq: Two times a day (BID) | RESPIRATORY_TRACT | Status: DC
  Administered 2011-09-10 – 2011-09-16 (×11): 1 via RESPIRATORY_TRACT
  Filled 2011-09-10 (×2): qty 12

## 2011-09-10 MED ORDER — CYCLOBENZAPRINE HCL 10 MG PO TABS
5.0000 mg | ORAL_TABLET | Freq: Three times a day (TID) | ORAL | Status: DC | PRN
  Administered 2011-09-11 – 2011-09-14 (×4): 5 mg via ORAL
  Administered 2011-09-15: 10:00:00 via ORAL
  Filled 2011-09-10 (×4): qty 1
  Filled 2011-09-10 (×2): qty 0.5

## 2011-09-10 MED ORDER — PANTOPRAZOLE SODIUM 40 MG PO TBEC
40.0000 mg | DELAYED_RELEASE_TABLET | Freq: Every day | ORAL | Status: DC
  Administered 2011-09-10 – 2011-09-15 (×6): 40 mg via ORAL
  Filled 2011-09-10 (×7): qty 1

## 2011-09-10 MED ORDER — ATENOLOL 50 MG PO TABS
50.0000 mg | ORAL_TABLET | Freq: Every day | ORAL | Status: DC
  Administered 2011-09-11 – 2011-09-14 (×4): 50 mg via ORAL
  Filled 2011-09-10 (×6): qty 1

## 2011-09-10 MED ORDER — VANCOMYCIN HCL IN DEXTROSE 1-5 GM/200ML-% IV SOLN
1000.0000 mg | Freq: Once | INTRAVENOUS | Status: AC
  Administered 2011-09-10: 1000 mg via INTRAVENOUS
  Filled 2011-09-10: qty 200

## 2011-09-10 MED ORDER — ALBUTEROL SULFATE HFA 108 (90 BASE) MCG/ACT IN AERS
2.0000 | INHALATION_SPRAY | Freq: Four times a day (QID) | RESPIRATORY_TRACT | Status: DC | PRN
  Filled 2011-09-10: qty 6.7

## 2011-09-10 MED ORDER — CHLORHEXIDINE GLUCONATE 4 % EX LIQD
60.0000 mL | Freq: Once | CUTANEOUS | Status: DC
  Filled 2011-09-10: qty 60

## 2011-09-10 NOTE — Progress Notes (Addendum)
ANTIBIOTIC CONSULT NOTE - INITIAL  Pharmacy Consult for Vancomycin Indication: Infected TKA  Allergies  Allergen Reactions  . Hydrochlorothiazide Other (See Comments)    unknown  . Lipitor (Atorvastatin Calcium) Other (See Comments)    unknown  . Thiazide-Type Diuretics Other (See Comments)    unknown  . Zocor (Simvastatin) Swelling    Patient Measurements:  Weight: 68kg  Vital Signs: Temp: 97.4 F (36.3 C) (12/17 1853) Temp src: Oral (12/17 1853) BP: 161/47 mmHg (12/17 1853) Pulse Rate: 78  (12/17 1853)  Labs: No results found for this basename: WBC:3,HGB:3,PLT:3,LABCREA:3,CREATININE:3 in the last 72 hours CrCl is unknown because there is no height on file for the current visit. No results found for this basename: VANCOTROUGH:2,VANCOPEAK:2,VANCORANDOM:2,GENTTROUGH:2,GENTPEAK:2,GENTRANDOM:2,TOBRATROUGH:2,TOBRAPEAK:2,TOBRARND:2,AMIKACINPEAK:2,AMIKACINTROU:2,AMIKACIN:2, in the last 72 hours     Assessment:  71 YOF presented with infected TKA, to start vancomycin per pharmacy. BMET and CBC pending. scr 0.96 in Oct. est. crcl ~50. likely for I&D  Goal of Therapy:  Vancomycin trough level 15-20 mcg/ml  Plan:  Give vancomycin 1g now, f/u BMET and surgery plan to schedule further doses.  Manley Mason 09/10/2011,7:20 PM  Addendum:  Scr 1.23 which is elevated compares with October. Est. crcl 40. Plan for surgery on Wednesday.   Will schedule vancomycin 1250mg  IV Q24hrs from tomorrow at 1600 Will f/u renal fx to adjust dose.

## 2011-09-10 NOTE — H&P (Signed)
[name] is an 77 y.o. female.   Chief Complaint: R Rev TKA Infection HPI: [name] was here on 08/10/2011 for followup of revision right total knee arthroplasty that was done on 05/14/2011 with a subsequent superficial wound infection10/09/12 requiring IV antibiotics for 2 weeks with the use of a PICC line and oral doxycycline. Cultures were neg. She did well until coming in for followup on 08/10/2011 when she saw Dr. Rip Harbour who aspirated out 2 mL of clear serosanguinous fluid from the pes anserine bursal region.  This came back with 1800 white cells and was cultured negative.  Her blood work has shown a sed rate of 12, CRP of 4 and a white count 4.6. In last few days swelling pain, fever to 102 despite continued doxycycline. Aspirate of pes bursa shows 25cc of yellow pus, sent for GS, cell count today. Admit for I&D with IV abx.   PMH: HTN, Asthma, PUD.  PSH: Cholecystectomy, C-Section, R TKA 2003, L THA Jan 2012  FHx: P:ositive for Diabetes, HTN, Heart Disease   Social History: no smoking, no ETOH  Allergies:  Allergies  Allergen Reactions  . Hydrochlorothiazide Other (See Comments)    unknown  . Lipitor (Atorvastatin Calcium) Other (See Comments)    unknown  . Thiazide-Type Diuretics Other (See Comments)    unknown  . Zocor (Simvastatin) Swelling    Medications Prior to Admission  Medication Dose Route Frequency Provider Last Rate Last Dose  . albuterol (PROVENTIL HFA;VENTOLIN HFA) 108 (90 BASE) MCG/ACT inhaler 2 puff  2 puff Inhalation Q6H PRN Kerin Salen      . ALPRAZolam Duanne Moron) tablet 0.5 mg  0.5 mg Oral QHS PRN Kerin Salen      . amLODipine (NORVASC) tablet 10 mg  10 mg Oral Daily Kerin Salen      . atenolol (TENORMIN) tablet 50 mg  50 mg Oral Daily Kerin Salen      . chlorhexidine (HIBICLENS) 4 % liquid 4 application  60 mL Topical Once Kerin Salen      . cloNIDine (CATAPRES) tablet 0.1 mg  0.1 mg Oral QHS Kerin Salen      . cyclobenzaprine (FLEXERIL)  tablet 5 mg  5 mg Oral TID PRN Kerin Salen      . dextrose 5 %-0.45 % sodium chloride infusion   Intravenous Continuous Kerin Salen      . fluticasone (FLOVENT HFA) 110 MCG/ACT inhaler 1 puff  1 puff Inhalation BID Kerin Salen      . gabapentin (NEURONTIN) capsule 300 mg  300 mg Oral BID Kerin Salen      . HYDROcodone-acetaminophen (NORCO) 10-325 MG per tablet 1 tablet  1 tablet Oral Q6H PRN Kerin Salen      . pantoprazole (PROTONIX) EC tablet 40 mg  40 mg Oral Daily Kerin Salen      . vancomycin (VANCOCIN) 1,250 mg in sodium chloride 0.9 % 250 mL IVPB  1,250 mg Intravenous Q24H Manley Mason, PHARMD      . vancomycin (VANCOCIN) IVPB 1000 mg/200 mL premix  1,000 mg Intravenous Once Manley Mason, PHARMD       No current outpatient prescriptions on file as of 09/10/2011.    Results for orders placed during the hospital encounter of 09/10/11 (from the past 48 hour(s))  TYPE AND SCREEN     Status: Normal (Preliminary result)   Collection Time   09/10/11  7:30 PM  Component Value Range Comment   ABO/RH(D) A POS      Antibody Screen PENDING      Sample Expiration 09/13/2011     APTT     Status: Normal   Collection Time   09/10/11  7:39 PM      Component Value Range Comment   aPTT 30  24 - 37 (seconds)   BASIC METABOLIC PANEL     Status: Abnormal   Collection Time   09/10/11  7:39 PM      Component Value Range Comment   Sodium 141  135 - 145 (mEq/L)    Potassium 4.3  3.5 - 5.1 (mEq/L)    Chloride 106  96 - 112 (mEq/L)    CO2 27  19 - 32 (mEq/L)    Glucose, Bld 117 (*) 70 - 99 (mg/dL)    BUN 24 (*) 6 - 23 (mg/dL)    Creatinine, Ser 1.23 (*) 0.50 - 1.10 (mg/dL)    Calcium 9.2  8.4 - 10.5 (mg/dL)    GFR calc non Af Amer 41 (*) >90 (mL/min)    GFR calc Af Amer 48 (*) >90 (mL/min)   CBC     Status: Abnormal   Collection Time   09/10/11  7:39 PM      Component Value Range Comment   WBC 7.7  4.0 - 10.5 (K/uL)    RBC 3.91  3.87 - 5.11 (MIL/uL)    Hemoglobin 10.4 (*)  12.0 - 15.0 (g/dL)    HCT 33.3 (*) 36.0 - 46.0 (%)    MCV 85.2  78.0 - 100.0 (fL)    MCH 26.6  26.0 - 34.0 (pg)    MCHC 31.2  30.0 - 36.0 (g/dL)    RDW 15.9 (*) 11.5 - 15.5 (%)    Platelets 179  150 - 400 (K/uL)   DIFFERENTIAL     Status: Normal   Collection Time   09/10/11  7:39 PM      Component Value Range Comment   Neutrophils Relative 70  43 - 77 (%)    Neutro Abs 5.4  1.7 - 7.7 (K/uL)    Lymphocytes Relative 20  12 - 46 (%)    Lymphs Abs 1.5  0.7 - 4.0 (K/uL)    Monocytes Relative 9  3 - 12 (%)    Monocytes Absolute 0.7  0.1 - 1.0 (K/uL)    Eosinophils Relative 1  0 - 5 (%)    Eosinophils Absolute 0.1  0.0 - 0.7 (K/uL)    Basophils Relative 0  0 - 1 (%)    Basophils Absolute 0.0  0.0 - 0.1 (K/uL)   PROTIME-INR     Status: Normal   Collection Time   09/10/11  7:39 PM      Component Value Range Comment   Prothrombin Time 14.3  11.6 - 15.2 (seconds)    INR 1.09  0.00 - 1.49    SEDIMENTATION RATE     Status: Abnormal   Collection Time   09/10/11  7:39 PM      Component Value Range Comment   Sed Rate 35 (*) 0 - 22 (mm/hr)     Chest 2 View  09/10/2011  *RADIOLOGY REPORT*  Clinical Data: Preop for knee surgery  CHEST - 2 VIEW  Comparison: 07/04/2011  Findings: Mild cardiac enlargement with vascular congestion and basilar atelectasis.  No definite CHF, pneumonia, collapse, consolidation, effusion or pneumothorax.  Degenerative changes of the spine and right shoulder.  IMPRESSION: Low  volume exam.  Cardiomegaly without CHF or pneumonia  Basilar atelectasis  Original Report Authenticated By: Jerilynn Mages. Daryll Brod, M.D.    Review of Systems  Constitutional: Positive for fever.  HENT: Negative.   Eyes: Negative.   Respiratory: Negative.   Cardiovascular: Positive for claudication.  Gastrointestinal: Negative.   Genitourinary: Negative.   Musculoskeletal: Positive for myalgias and joint pain.       R Knee pain, mild L THA Pain  Skin: Negative.   Neurological: Negative.     Endo/Heme/Allergies: Negative.   Psychiatric/Behavioral: Negative.     Blood pressure 161/47, pulse 78, temperature 97.4 F (36.3 C), temperature source Oral, resp. rate 20, SpO2 100.00%. Physical Exam  Constitutional: She is oriented to person, place, and time. She appears well-developed and well-nourished.  HENT:  Head: Normocephalic.  Eyes: EOM are normal.  Neck: Normal range of motion. Neck supple.  Cardiovascular: Normal rate.   Respiratory: Effort normal and breath sounds normal.  GI: Soft.  Musculoskeletal:       R TKa with no effusion,  ROM 0-115, swollen and tender over Pes Anserine Bursa with palp fluid. Aspirated 25cc yellowish pus NV intact. XR 11/16 of R Rev TKA no osteolysis.  Neurological: She is alert and oriented to person, place, and time. She has normal reflexes.  Skin: Skin is warm and dry.  Psychiatric: She has a normal mood and affect.     Assessment/Plan  Infected R Rev TKA. Will get results of Gramstain, admit for IV ABX, ID R TKA with poly swap.  Frederik Pear J 09/10/2011, 9:11 PM

## 2011-09-11 ENCOUNTER — Encounter (HOSPITAL_COMMUNITY): Payer: Self-pay

## 2011-09-11 MED ORDER — SODIUM CHLORIDE 0.9 % IJ SOLN
10.0000 mL | INTRAMUSCULAR | Status: DC | PRN
  Administered 2011-09-16 (×2): 10 mL

## 2011-09-11 NOTE — Progress Notes (Signed)
Subjective: Awakw, alert, R Knee pain 5/10. Reports diffuse pain in multiple joints SDRs, hips and feet. No N&V   Objective: Vital signs in last 24 hours: Temp:  [97.4 F (36.3 C)-98 F (36.7 C)] 98 F (36.7 C) (12/17 2250) Pulse Rate:  [77-78] 77  (12/17 2250) Resp:  [18-20] 18  (12/17 2250) BP: (145-161)/(47-49) 145/49 mmHg (12/17 2250) SpO2:  [94 %-100 %] 94 % (12/17 2250)  Intake/Output from previous day: 12/17 0701 - 12/18 0700 In: -  Out: 1000 [Urine:1000] Intake/Output this shift: Total I/O In: -  Out: 1000 [Urine:1000]   Basename 09/10/11 1939  HGB 10.4*    Basename 09/10/11 1939  WBC 7.7  RBC 3.91  HCT 33.3*  PLT 179    Basename 09/10/11 1939  NA 141  K 4.3  CL 106  CO2 27  BUN 24*  CREATININE 1.23*  GLUCOSE 117*  CALCIUM 9.2    Basename 09/10/11 1939  LABPT --  INR 1.09    Neurologically intact ABD soft Neurovascular intact Sensation intact distally Intact pulses distally Dorsiflexion/Plantar flexion intact Compartment soft R knee with swellling along pes insertion, no TKA effusion. Cults of pes aspirate pending.  Assessment/Plan: I&D R TKA 09/12/11 PICC line T&C 2 unit PRBC   Latroy Gaymon J 09/11/2011, 6:23 AM

## 2011-09-12 ENCOUNTER — Inpatient Hospital Stay: Admit: 2011-09-12 | Payer: Self-pay | Admitting: Orthopedic Surgery

## 2011-09-12 ENCOUNTER — Encounter (HOSPITAL_COMMUNITY)
Admission: AD | Disposition: A | Discharge status: Home Health | Payer: Self-pay | Source: Ambulatory Visit | Attending: Orthopedic Surgery

## 2011-09-12 ENCOUNTER — Encounter (HOSPITAL_COMMUNITY): Payer: Self-pay | Admitting: Certified Registered"

## 2011-09-12 ENCOUNTER — Inpatient Hospital Stay (HOSPITAL_COMMUNITY): Payer: Medicare Other | Admitting: Certified Registered"

## 2011-09-12 HISTORY — PX: IRRIGATION AND DEBRIDEMENT KNEE: SHX5185

## 2011-09-12 SURGERY — IRRIGATION AND DEBRIDEMENT KNEE
Anesthesia: General | Laterality: Right | Wound class: Dirty or Infected

## 2011-09-12 MED ORDER — METOCLOPRAMIDE HCL 5 MG/ML IJ SOLN
5.0000 mg | Freq: Three times a day (TID) | INTRAMUSCULAR | Status: DC | PRN
  Filled 2011-09-12: qty 2

## 2011-09-12 MED ORDER — PHENOL 1.4 % MT LIQD
1.0000 | OROMUCOSAL | Status: DC | PRN
  Filled 2011-09-12: qty 177

## 2011-09-12 MED ORDER — HYDROGEN PEROXIDE 3 % EX SOLN
CUTANEOUS | Status: DC | PRN
  Administered 2011-09-12: 1

## 2011-09-12 MED ORDER — KCL IN DEXTROSE-NACL 20-5-0.45 MEQ/L-%-% IV SOLN
INTRAVENOUS | Status: DC
  Administered 2011-09-13 – 2011-09-14 (×5): via INTRAVENOUS
  Filled 2011-09-12 (×9): qty 1000

## 2011-09-12 MED ORDER — METOCLOPRAMIDE HCL 10 MG PO TABS: 5.0000 mg | ORAL_TABLET | Freq: Three times a day (TID) | ORAL | Status: DC | PRN

## 2011-09-12 MED ORDER — ENOXAPARIN SODIUM 30 MG/0.3ML ~~LOC~~ SOLN
30.0000 mg | Freq: Two times a day (BID) | SUBCUTANEOUS | Status: DC
  Administered 2011-09-13 – 2011-09-16 (×7): 30 mg via SUBCUTANEOUS
  Filled 2011-09-12 (×9): qty 0.3

## 2011-09-12 MED ORDER — OXYCHLOROSENE SODIUM POWD
Status: DC | PRN
  Administered 2011-09-12: 1

## 2011-09-12 MED ORDER — DIPHENHYDRAMINE HCL 12.5 MG/5ML PO ELIX
12.5000 mg | ORAL_SOLUTION | ORAL | Status: DC | PRN
  Administered 2011-09-12: 25 mg via ORAL
  Filled 2011-09-12: qty 10

## 2011-09-12 MED ORDER — ONDANSETRON HCL 4 MG/2ML IJ SOLN: 4.0000 mg | Freq: Four times a day (QID) | INTRAMUSCULAR | Status: DC | PRN

## 2011-09-12 MED ORDER — ONDANSETRON HCL 4 MG/2ML IJ SOLN
INTRAMUSCULAR | Status: DC | PRN
  Administered 2011-09-12: 4 mg via INTRAVENOUS

## 2011-09-12 MED ORDER — ACETAMINOPHEN 325 MG PO TABS: 650.0000 mg | ORAL_TABLET | Freq: Four times a day (QID) | ORAL | Status: DC | PRN

## 2011-09-12 MED ORDER — LACTATED RINGERS IV SOLN
INTRAVENOUS | Status: DC
  Administered 2011-09-12: 15:00:00 via INTRAVENOUS

## 2011-09-12 MED ORDER — PROPOFOL 10 MG/ML IV EMUL
INTRAVENOUS | Status: DC | PRN
  Administered 2011-09-12: 200 mL via INTRAVENOUS

## 2011-09-12 MED ORDER — MENTHOL 3 MG MT LOZG: 1.0000 | LOZENGE | OROMUCOSAL | Status: DC | PRN

## 2011-09-12 MED ORDER — RIFAMPIN 300 MG PO CAPS
300.0000 mg | ORAL_CAPSULE | Freq: Two times a day (BID) | ORAL | Status: DC
  Administered 2011-09-12 – 2011-09-16 (×8): 300 mg via ORAL
  Filled 2011-09-12 (×9): qty 1

## 2011-09-12 MED ORDER — ACETAMINOPHEN 650 MG RE SUPP: 650.0000 mg | Freq: Four times a day (QID) | RECTAL | Status: DC | PRN

## 2011-09-12 MED ORDER — FENTANYL CITRATE 0.05 MG/ML IJ SOLN
INTRAMUSCULAR | Status: DC | PRN
  Administered 2011-09-12 (×3): 25 ug via INTRAVENOUS
  Administered 2011-09-12: 50 ug via INTRAVENOUS
  Administered 2011-09-12: 25 ug via INTRAVENOUS
  Administered 2011-09-12: 50 ug via INTRAVENOUS

## 2011-09-12 MED ORDER — HYDROMORPHONE HCL PF 1 MG/ML IJ SOLN
0.5000 mg | INTRAMUSCULAR | Status: DC | PRN
  Administered 2011-09-12 – 2011-09-16 (×12): 1 mg via INTRAVENOUS
  Filled 2011-09-12 (×13): qty 1

## 2011-09-12 MED ORDER — HYDROMORPHONE HCL PF 1 MG/ML IJ SOLN
0.2500 mg | INTRAMUSCULAR | Status: DC | PRN
  Administered 2011-09-12 (×4): 0.5 mg via INTRAVENOUS

## 2011-09-12 MED ORDER — LACTATED RINGERS IV SOLN
INTRAVENOUS | Status: DC | PRN
  Administered 2011-09-12 (×2): via INTRAVENOUS

## 2011-09-12 MED ORDER — OXYCHLOROSENE SODIUM POWD
Status: DC
  Filled 2011-09-12: qty 2

## 2011-09-12 MED ORDER — ZOLPIDEM TARTRATE 5 MG PO TABS
5.0000 mg | ORAL_TABLET | Freq: Every evening | ORAL | Status: DC | PRN
  Administered 2011-09-15 (×2): 5 mg via ORAL
  Filled 2011-09-12 (×2): qty 1

## 2011-09-12 MED ORDER — ONDANSETRON HCL 4 MG PO TABS: 4.0000 mg | ORAL_TABLET | Freq: Four times a day (QID) | ORAL | Status: DC | PRN

## 2011-09-12 SURGICAL SUPPLY — 60 items
BANDAGE ELASTIC 6 VELCRO ST LF (GAUZE/BANDAGES/DRESSINGS) ×1 IMPLANT
BANDAGE ESMARK 6X9 LF (GAUZE/BANDAGES/DRESSINGS) ×1 IMPLANT
BLADE SURG 10 STRL SS (BLADE) ×2 IMPLANT
BNDG CMPR 9X6 STRL LF SNTH (GAUZE/BANDAGES/DRESSINGS) ×1
BNDG ESMARK 6X9 LF (GAUZE/BANDAGES/DRESSINGS) ×2
BOWL SMART MIX CTS (DISPOSABLE) ×1 IMPLANT
CLOTH BEACON ORANGE TIMEOUT ST (SAFETY) ×2 IMPLANT
CONT SPEC 4OZ CLIKSEAL STRL BL (MISCELLANEOUS) ×2 IMPLANT
COVER BACK TABLE 24X17X13 BIG (DRAPES) IMPLANT
COVER SURGICAL LIGHT HANDLE (MISCELLANEOUS) ×2 IMPLANT
CUFF TOURNIQUET SINGLE 34IN LL (TOURNIQUET CUFF) ×1 IMPLANT
CUFF TOURNIQUET SINGLE 44IN (TOURNIQUET CUFF) IMPLANT
DRAPE EXTREMITY T 121X128X90 (DRAPE) ×2 IMPLANT
DRAPE INCISE IOBAN 66X45 STRL (DRAPES) IMPLANT
DRAPE PROXIMA HALF (DRAPES) ×2 IMPLANT
DRAPE U-SHAPE 47X51 STRL (DRAPES) ×2 IMPLANT
DRSG ADAPTIC 3X8 NADH LF (GAUZE/BANDAGES/DRESSINGS) ×2 IMPLANT
DRSG PAD ABDOMINAL 8X10 ST (GAUZE/BANDAGES/DRESSINGS) ×2 IMPLANT
DURAPREP 26ML APPLICATOR (WOUND CARE) ×4 IMPLANT
ELECT REM PT RETURN 9FT ADLT (ELECTROSURGICAL) ×2
ELECTRODE REM PT RTRN 9FT ADLT (ELECTROSURGICAL) ×1 IMPLANT
EVACUATOR 1/8 PVC DRAIN (DRAIN) ×2 IMPLANT
EVACUATOR 3/16  PVC DRAIN (DRAIN) ×1
EVACUATOR 3/16 PVC DRAIN (DRAIN) IMPLANT
GLOVE BIO SURGEON STRL SZ7 (GLOVE) ×1 IMPLANT
GLOVE BIO SURGEON STRL SZ7.5 (GLOVE) ×2 IMPLANT
GLOVE BIOGEL PI IND STRL 8 (GLOVE) ×1 IMPLANT
GLOVE BIOGEL PI INDICATOR 8 (GLOVE) ×1
GOWN PREVENTION PLUS XLARGE (GOWN DISPOSABLE) ×2 IMPLANT
GOWN STRL NON-REIN LRG LVL3 (GOWN DISPOSABLE) ×4 IMPLANT
HANDPIECE INTERPULSE COAX TIP (DISPOSABLE) ×2
HOOD PEEL AWAY FACE SHEILD DIS (HOOD) IMPLANT
IMMOBILIZER KNEE 20 (SOFTGOODS) ×2
IMMOBILIZER KNEE 20 THIGH 36 (SOFTGOODS) IMPLANT
INSERT PFC SIG STB SZ 2.5 15.5 (Knees) ×1 IMPLANT
KIT BASIN OR (CUSTOM PROCEDURE TRAY) ×2 IMPLANT
KIT ROOM TURNOVER OR (KITS) ×2 IMPLANT
MANIFOLD NEPTUNE II (INSTRUMENTS) ×2 IMPLANT
NS IRRIG 1000ML POUR BTL (IV SOLUTION) ×2 IMPLANT
PACK TOTAL JOINT (CUSTOM PROCEDURE TRAY) ×2 IMPLANT
PAD ARMBOARD 7.5X6 YLW CONV (MISCELLANEOUS) ×4 IMPLANT
PADDING CAST COTTON 6X4 STRL (CAST SUPPLIES) ×2 IMPLANT
PADDING WEBRIL 6 STERILE (GAUZE/BANDAGES/DRESSINGS) ×1 IMPLANT
SET HNDPC FAN SPRY TIP SCT (DISPOSABLE) ×1 IMPLANT
SPONGE GAUZE 4X4 12PLY (GAUZE/BANDAGES/DRESSINGS) ×2 IMPLANT
SPONGE GAUZE 4X4 STERILE 39 (GAUZE/BANDAGES/DRESSINGS) ×1 IMPLANT
STAPLER VISISTAT 35W (STAPLE) ×1 IMPLANT
SUCTION FRAZIER TIP 10 FR DISP (SUCTIONS) ×2 IMPLANT
SUT VIC AB 0 CT1 27 (SUTURE)
SUT VIC AB 0 CT1 27XBRD ANBCTR (SUTURE) ×1 IMPLANT
SUT VIC AB 1 CTX 36 (SUTURE) ×2
SUT VIC AB 1 CTX36XBRD ANBCTR (SUTURE) ×1 IMPLANT
SUT VIC AB 2-0 CT1 27 (SUTURE) ×2
SUT VIC AB 2-0 CT1 TAPERPNT 27 (SUTURE) ×1 IMPLANT
SYR 20CC LL (SYRINGE) ×4 IMPLANT
TOWEL OR 17X24 6PK STRL BLUE (TOWEL DISPOSABLE) ×2 IMPLANT
TOWEL OR 17X26 10 PK STRL BLUE (TOWEL DISPOSABLE) ×2 IMPLANT
TRAY FOLEY CATH 14FR (SET/KITS/TRAYS/PACK) ×1 IMPLANT
TUBE ANAEROBIC SPECIMEN COL (MISCELLANEOUS) ×2 IMPLANT
WATER STERILE IRR 1000ML POUR (IV SOLUTION) ×6 IMPLANT

## 2011-09-12 NOTE — Brief Op Note (Signed)
09/10/2011 - 09/12/2011  5:28 PM  PATIENT:  [name]  77 y.o. female  PRE-OPERATIVE DIAGNOSIS:  INFECTED RIGHT REVISION TOTAL KNEE  POST-OPERATIVE DIAGNOSIS:  Infected right revision total knee  PROCEDURE:  Procedure(s): IRRIGATION AND DEBRIDEMENT KNEE Revise Tibial component 61mm 2.5 MBT bearing  SURGEON:  Surgeon(s): Kerin Salen  ANESTHESIA:   general  EBL:  Total I/O In: 1400 [I.V.:1400] Out: 350 [Urine:350]  BLOOD ADMINISTERED:none  DRAINS: (2) Hemovact drain(s) in the R knee with  Suction Open and Urinary Catheter (Foley)   LOCAL MEDICATIONS USED:  NONE  SPECIMEN:  Source of Specimen:  R knee pus and synovium  DISPOSITION OF SPECIMEN:  PATHOLOGY  COUNTS:  YES  TOURNIQUET:   Total Tourniquet Time Documented: Thigh (Right) - 63 minutes  DICTATION: .Other Dictation: Dictation Number   PLAN OF CARE: Admit to inpatient   PATIENT DISPOSITION:  PACU - hemodynamically stable.   Delay start of Pharmacological VTE agent (>24hrs) due to surgical blood loss or risk of bleeding:  {YES/NO/NOT APPLICABLE:20182

## 2011-09-12 NOTE — Anesthesia Postprocedure Evaluation (Signed)
Anesthesia Post Note  Patient: [name]  Procedure(s) Performed:  IRRIGATION AND DEBRIDEMENT KNEE - I&D RIGHT TKA REVISE BEARINGS/MBT. Poly exchange  Anesthesia type: General  Patient location: PACU  Post pain: Pain level controlled and Adequate analgesia  Post assessment: Post-op Vital signs reviewed, Patient's Cardiovascular Status Stable, Respiratory Function Stable, Patent Airway and Pain level controlled  Last Vitals:  Filed Vitals:   09/12/11 1715  BP:   Pulse:   Temp: 37.1 C  Resp:     Post vital signs: Reviewed and stable  Level of consciousness: awake, alert  and oriented  Complications: No apparent anesthesia complications

## 2011-09-12 NOTE — Transfer of Care (Signed)
Immediate Anesthesia Transfer of Care Note  Patient: [name]  Procedure(s) Performed:  IRRIGATION AND DEBRIDEMENT KNEE - I&D RIGHT TKA REVISE BEARINGS/MBT. Poly exchange  Patient Location: PACU  Anesthesia Type: General  Level of Consciousness: awake, alert , oriented and patient cooperative  Airway & Oxygen Therapy: Patient Spontanous Breathing and Patient connected to face mask oxygen  Post-op Assessment: Report given to PACU RN, Post -op Vital signs reviewed and stable and Patient moving all extremities  Post vital signs: Reviewed and stable  Complications: No apparent anesthesia complications

## 2011-09-12 NOTE — Progress Notes (Signed)
Subjective: Pain decreased to 1/10, cults pending, no N&V   Objective: Vital signs in last 24 hours: Temp:  [98 F (36.7 C)-98.2 F (36.8 C)] 98.2 F (36.8 C) (12/18 2135) Pulse Rate:  [75-82] 78  (12/18 2135) Resp:  [16-20] 18  (12/18 2135) BP: (138-167)/(63-76) 167/64 mmHg (12/18 2135) SpO2:  [97 %-98 %] 97 % (12/18 2135)  Intake/Output from previous day: 12/18 0701 - 12/19 0700 In: 760 [P.O.:160; I.V.:600] Out: -  Intake/Output this shift:     Basename 09/10/11 1939  HGB 10.4*    Basename 09/10/11 1939  WBC 7.7  RBC 3.91  HCT 33.3*  PLT 179    Basename 09/10/11 1939  NA 141  K 4.3  CL 106  CO2 27  BUN 24*  CREATININE 1.23*  GLUCOSE 117*  CALCIUM 9.2    Basename 09/10/11 1939  LABPT --  INR 1.09    Neurologically intact ABD soft Neurovascular intact Sensation intact distally Intact pulses distally Dorsiflexion/Plantar flexion intact Incision: dressing C/D/I No cellulitis present Compartment soft R Pes insertion, nontender and no palp abcess. Assessment/Plan: I&D today, await cultures, cont IV ABX   [name] J 09/12/2011, 5:59 AM

## 2011-09-12 NOTE — Progress Notes (Signed)
Endorsed to OR nurse about daughter concer/request for no staples to patient.

## 2011-09-12 NOTE — Preoperative (Signed)
Beta Blockers   Reason not to administer Beta Blockers:Took beta blocker 12/19 am

## 2011-09-12 NOTE — Interval H&P Note (Signed)
History and Physical Interval Note:  09/12/2011 3:02 PM  [name]  has presented today for surgery, with the diagnosis of INFECTED RIGHT REVISION TOTAL KNEE  The various methods of treatment have been discussed with the patient and family. After consideration of risks, benefits and other options for treatment, the patient has consented to  Procedure(s): IRRIGATION AND DEBRIDEMENT KNEE as a surgical intervention .  The patients' history has been reviewed, patient examined, no change in status, stable for surgery.  I have reviewed the patients' chart and labs.  Questions were answered to the patient's satisfaction.     Kerin Salen

## 2011-09-12 NOTE — Anesthesia Preprocedure Evaluation (Addendum)
Anesthesia Evaluation  Patient identified by MRN, date of birth, ID band Patient awake    Reviewed: Allergy & Precautions, H&P , NPO status , Patient's Chart, lab work & pertinent test results, reviewed documented beta blocker date and time   Airway Mallampati: II  Neck ROM: full    Dental   Pulmonary COPD         Cardiovascular hypertension,     Neuro/Psych Anxiety    GI/Hepatic GERD-  ,  Endo/Other    Renal/GU      Musculoskeletal   Abdominal   Peds  Hematology   Anesthesia Other Findings   Reproductive/Obstetrics                         Anesthesia Physical Anesthesia Plan  ASA: III  Anesthesia Plan: General   Post-op Pain Management:    Induction: Intravenous  Airway Management Planned: LMA  Additional Equipment:   Intra-op Plan:   Post-operative Plan: Extubation in OR  Informed Consent: I have reviewed the patients History and Physical, chart, labs and discussed the procedure including the risks, benefits and alternatives for the proposed anesthesia with the patient or authorized representative who has indicated his/her understanding and acceptance.     Plan Discussed with: CRNA and Surgeon  Anesthesia Plan Comments:         Anesthesia Quick Evaluation

## 2011-09-13 ENCOUNTER — Encounter (HOSPITAL_COMMUNITY): Payer: Self-pay | Admitting: *Deleted

## 2011-09-13 LAB — BASIC METABOLIC PANEL
CO2: 26 mEq/L (ref 19–32)
Chloride: 102 mEq/L (ref 96–112)
Creatinine, Ser: 0.74 mg/dL (ref 0.50–1.10)
GFR calc Af Amer: >90 mL/min (ref >90)
Potassium: 3.5 mEq/L (ref 3.5–5.1)
Sodium: 136 mEq/L (ref 135–145)

## 2011-09-13 LAB — VANCOMYCIN, TROUGH: Vancomycin Tr: 8 ug/mL — ABNORMAL LOW (ref 10.0–20.0)

## 2011-09-13 LAB — CBC
MCV: 83.5 fL (ref 78.0–100.0)
Platelets: 221 10*3/uL (ref 150–400)
RBC: 3.64 MIL/uL — ABNORMAL LOW (ref 3.87–5.11)
RDW: 15.4 % (ref 11.5–15.5)
WBC: 6.2 10*3/uL (ref 4.0–10.5)

## 2011-09-13 MED ORDER — VANCOMYCIN HCL 1000 MG IV SOLR
1250.0000 mg | Freq: Two times a day (BID) | INTRAVENOUS | Status: DC
  Administered 2011-09-14 – 2011-09-16 (×6): 1250 mg via INTRAVENOUS
  Filled 2011-09-13 (×7): qty 1250

## 2011-09-13 NOTE — Progress Notes (Signed)
Subjective: 1 Day Post-Op Procedure(s) (LRB): IRRIGATION AND DEBRIDEMENT KNEE (Right) Patient reports pain as 7 on 0-10 scale.  Denies N&V. Answered questions about deep infection.  Objective: Vital signs in last 24 hours: Temp:  [97.6 F (36.4 C)-98.8 F (37.1 C)] 98.2 F (36.8 C) (12/20 0500) Pulse Rate:  [69-80] 79  (12/20 0500) Resp:  [8-35] 20  (12/20 0500) BP: (136-179)/(51-69) 150/61 mmHg (12/20 0500) SpO2:  [94 %-100 %] 97 % (12/20 0500) Weight:  [85.503 kg (188 lb 8 oz)] 188 lb 8 oz (85.503 kg) (12/19 1420)  Intake/Output from previous day: 12/19 0701 - 12/20 0700 In: 1400 [I.V.:1400] Out: 2420 [Urine:2300; Drains:120] Intake/Output this shift:     Basename 09/13/11 0610 09/10/11 1939  HGB 9.6* 10.4*    Basename 09/13/11 0610 09/10/11 1939  WBC 6.2 7.7  RBC 3.64* 3.91  HCT 30.4* 33.3*  PLT 221 179    Basename 09/13/11 0610 09/10/11 1939  NA 136 141  K 3.5 4.3  CL 102 106  CO2 26 27  BUN 12 24*  CREATININE 0.74 1.23*  GLUCOSE 137* 117*  CALCIUM 8.4 9.2    Basename 09/10/11 1939  LABPT --  INR 1.09    Neurologically intact ABD soft Neurovascular intact Sensation intact distally Intact pulses distally Dorsiflexion/Plantar flexion intact Incision: dressing C/D/I No cellulitis present Compartment soft Cultures from 09/10/11 @ my office are no growth at 48Hr  Assessment/Plan: 1 Day Post-Op Procedure(s) (LRB): IRRIGATION AND DEBRIDEMENT KNEE (Right) Advance diet Up with therapy Continue ABX therapy due to Post-op infection ID consult, no CPM,   [name] 09/13/2011, 7:18 AM

## 2011-09-13 NOTE — Progress Notes (Signed)
ANTIBIOTIC CONSULT NOTE - FOLLOW UP  Pharmacy Consult for Vancomycin Indication: Infected TKA  Allergies  Allergen Reactions  . Hydrochlorothiazide Other (See Comments)    unknown  . Lipitor (Atorvastatin Calcium) Other (See Comments)    unknown  . Thiazide-Type Diuretics Other (See Comments)    unknown  . Zocor (Simvastatin) Swelling    Patient Measurements: Height: 5' 0.5" (153.7 cm) Weight: 188 lb 8 oz (85.503 kg) IBW/kg (Calculated) : 46.65  Adjusted Body Weight:   Vital Signs: Temp: 97.9 F (36.6 C) (12/20 1747) BP: 169/56 mmHg (12/20 1747) Pulse Rate: 81  (12/20 1747) Intake/Output from previous day: 12/19 0701 - 12/20 0700 In: 1400 [I.V.:1400] Out: 2770 [Urine:2650; Drains:120] Intake/Output from this shift: Total I/O In: 800 [I.V.:800] Out: 2100 [Urine:2100]  Labs:  [facility] 09/13/11 0610 09/10/11 1939  WBC 6.2 7.7  HGB 9.6* 10.4*  PLT 221 179  LABCREA -- --  CREATININE 0.74 1.23*   Estimated Creatinine Clearance: 57.8 ml/min (by C-G formula based on Cr of 0.74).  Basename 09/13/11 1620  VANCOTROUGH 8.0*  VANCOPEAK --  Jake Michaelis --  GENTTROUGH --  GENTPEAK --  GENTRANDOM --  TOBRATROUGH --  TOBRAPEAK --  TOBRARND --  AMIKACINPEAK --  AMIKACINTROU --  AMIKACIN --     Microbiology: Recent Results (from the past 720 hour(s))  WOUND CULTURE     Status: Normal (Preliminary result)   Collection Time   09/12/11  6:26 PM      Component Value Range Status Comment   Specimen Description WOUND KNEE RIGHT   Final    Special Requests NONE   Final    Gram Stain     Final    Value: NO WBC SEEN     NO SQUAMOUS EPITHELIAL CELLS SEEN     NO ORGANISMS SEEN   Culture NO GROWTH   Final    Report Status PENDING   Incomplete     Anti-infectives     Start     Dose/Rate Route Frequency Ordered Stop   09/12/11 2200   rifampin (RIFADIN) capsule 300 mg        300 mg Oral Every 12 hours 09/12/11 2038     09/11/11 1600   vancomycin (VANCOCIN) 1,250 mg in  sodium chloride 0.9 % 250 mL IVPB        1,250 mg 166.7 mL/hr over 90 Minutes Intravenous Every 24 hours 09/10/11 2049     09/10/11 2000   vancomycin (VANCOCIN) IVPB 1000 mg/200 mL premix        1,000 mg 200 mL/hr over 60 Minutes Intravenous  Once 09/10/11 1924 09/10/11 2100          Assessment: Pharmacist System-Based Medication Review: Infectious Disease:Vanc D#4/Rifampin D#2 for infected TKA. I&D 12/19. 12/19 wound cx ngtd. afebrile. wbc wnl. MD consulting ID.  Vancomycin trough level now back subtherapeutic at 8. Cardiovascular: BP elevated - 150/61, HR 70s. Meds: clonidine/atenolol/norvasc. Neurology: On home xanax, gabapentin. Pt states pain 2-8/10 - on dilaudid and vicodin. Nephrology: Creat improved-down to 0.74 (from 1.23). UOP 1000/24h. lytes okay. Pulmonary: h/o asthma - on home fluticasone, alb prn PTA Medication Issues: None Best Practices: SCDs, ppi po   Goal of Therapy:  Vancomycin trough level 15-20 mcg/ml  Plan:  1) Change vancomycin dose to 1250 mg IV q12h  Seryna Marek P 09/13/2011,6:00 PM

## 2011-09-13 NOTE — Progress Notes (Signed)
Physical Therapy Evaluation Patient Details Name: [name] MRN: [id number] DOB: May 26, 1932 Today's Date: 09/13/2011  Problem List:  Patient Active Problem List  Diagnoses  . Infected prosthetic knee joint    Past Medical History:  Past Medical History  Diagnosis Date  . Diabetes mellitus    Past Surgical History: No past surgical history on file.  PT Assessment/Plan/Recommendation PT Assessment Clinical Impression Statement: Pt with I&D of R TKA with limited ROM and strength RLE secondary to pain. Pt educated for no pillow under knee and progression of ROM as well as mobility.  KI utilized for eval, will ask for clarification. Pt mobilizing well and will benefit from therapy to maximize ROM and gait before return home with family. PT Recommendation/Assessment: Patient will need skilled PT in the acute care venue PT Problem List: Decreased strength;Decreased range of motion;Decreased activity tolerance;Pain Barriers to Discharge: None PT Therapy Diagnosis : Difficulty walking;Abnormality of gait;Acute pain PT Plan PT Frequency: Min 3X/week PT Treatment/Interventions: Gait training;Stair training;Functional mobility training;Therapeutic exercise;Patient/family education PT Recommendation Follow Up Recommendations: Home health PT Equipment Recommended: None recommended by PT PT Goals  Acute Rehab PT Goals PT Goal Formulation: With patient Time For Goal Achievement: 7 days Pt will go Supine/Side to Sit: with modified independence;with HOB 0 degrees PT Goal: Supine/Side to Sit - Progress: Other (comment) Pt will Ambulate: 51 - 150 feet;with rolling walker;with modified independence PT Goal: Ambulate - Progress: Other (comment) Pt will Go Up / Down Stairs: 3-5 stairs;with supervision;with rail(s) PT Goal: Up/Down Stairs - Progress: Other (comment) Pt will Perform Home Exercise Program: with supervision, verbal cues required/provided PT Goal: Perform Home Exercise Program -  Progress: Other (comment)  PT Evaluation Precautions/Restrictions  Precautions Precautions: Knee Precaution Comments: KI present but no orders Restrictions Weight Bearing Restrictions: Yes RLE Weight Bearing: Weight bearing as tolerated Prior Functioning  Home Living Lives With: Family (grandson) Type of Home: Mobile home Home Layout: One level Home Access: Stairs to enter Entrance Stairs-Rails: Can reach both;Left;Right Entrance Stairs-Number of Steps: 4 Bathroom Shower/Tub: Tub/shower unit;Walk-in shower Bathroom Toilet: Standard Bathroom Accessibility: Yes How Accessible: Accessible via walker Home Adaptive Equipment: Shower chair with back;Bedside commode/3-in-1;Walker - rolling;Wheelchair - manual Additional Comments: 24 hour assist from grandson Prior Function Level of Independence: Independent with basic ADLs;Independent with transfers;Requires assistive device for independence;Independent with homemaking with ambulation Driving: Yes Vocation: Retired Comments: pt states she has had varied ability depending on how her knee feels between mod I and independent with gait Cognition Cognition Arousal/Alertness: Awake/alert Overall Cognitive Status: Appears within functional limits for tasks assessed Orientation Level: Oriented X4 Sensation/Coordination   Extremity Assessment RLE Assessment RLE Assessment: Exceptions to [facility] RLE AROM (degrees) Overall AROM Right Lower Extremity: Deficits (knee ROM limited by pain grossly 15degrees flexion) LLE Assessment LLE Assessment: Within Functional Limits Mobility (including Balance) Bed Mobility Bed Mobility: Yes Supine to Sit: 5: Supervision;HOB elevated (Comment degrees) (25degrees) Supine to Sit Details (indicate cue type and reason): cueing to use LLE to assist RLE as needed Sitting - Scoot to Edge of Bed: 6: Modified independent (Device/Increase time) Transfers Transfers: Yes Sit to Stand: 6: Modified independent  (Device/Increase time);From bed Stand to Sit: 5: Supervision;To chair/3-in-1 Stand to Sit Details: cueing to slide RLE out in front Ambulation/Gait Ambulation/Gait: Yes Ambulation/Gait Assistance: 5: Supervision Ambulation/Gait Assistance Details (indicate cue type and reason): cueing to step into the RW x 1. supervision for line management Ambulation Distance (Feet): 18 Feet Assistive device: Rolling walker Gait Pattern: Within Functional Limits Stairs:  No    Exercise    End of Session PT - End of Session Equipment Utilized During Treatment: Right knee immobilizer Activity Tolerance: Patient tolerated treatment well;Patient limited by pain Patient left: in chair;with call bell in reach;with family/visitor present Nurse Communication: Mobility status for transfers;Mobility status for ambulation General Behavior During Session: [facility] for tasks performed Cognition: [facility] for tasks performed  Melford Aase 09/13/2011, 10:39 AM  Lanetta Inch, [place]

## 2011-09-13 NOTE — Progress Notes (Signed)
Patient referred to Education officer, museum for evaluation. Met with patient and her daughter. Patient plans to return home at d/c; her daughter will stay with her.  She will benefit from home health services and wants [facility]. Patient states that she had DME at home. Notified Ricki Miller, RNCM of above.  Patient denies any SW needs; Social Worker signing off.  Williemae Area, BSW,09/13/2011 10:46 AM

## 2011-09-13 NOTE — Op Note (Signed)
[name], [name]               ACCOUNT NO.:  [id number]  MEDICAL RECORD NO.:  [id number]  LOCATION:  5128                         FACILITY:  [facility]  PHYSICIAN:  Kathalene Frames. Mayer Camel, M.D.   DATE OF BIRTH:  September 04, 1932  DATE OF PROCEDURE:  09/12/2011 DATE OF DISCHARGE:                              OPERATIVE REPORT   PREOPERATIVE DIAGNOSIS:  Infected revision right total knee.  POSTOPERATIVE DIAGNOSIS:  Infected revision right total knee.  PROCEDURE:  Radical irrigation of infected right total knee arthroplasty with complete synovectomy, revision of the polyethylene bearing, and both tissue and fluid samples were sent to the lab for culture and Gram stain, although fluid specimens from my office a couple of days ago have failed to grow anything as of yet.  SURGEON:  Kathalene Frames. Mayer Camel, MD  FIRST ASSISTANT:  None.  ANESTHETIC:  General endotracheal.  ESTIMATED BLOOD LOSS:  Minimal.  FLUID REPLACEMENT:  1500 mL of crystalloid.  DRAINS PLACED:  Two large-bore Hemovac and a Foley catheter.  URINE OUTPUT:  300 mL.  TOURNIQUET TIME:  About 68 minutes.  INDICATIONS FOR PROCEDURE:  A 77 year old woman who underwent a primary total knee arthroplasty, I believe, in 2004, had a femur fracture on the same side a few years ago and had an intramedullary rod placed.  The tibial component then came loose and I saw her in consultation from another physician at that point.  A few months ago, we revised the tibial component to a stemmed DePuy MBT tray with a 15-mm bearing.  We were able to retain the femoral component.  About 4-6 weeks postop, she started developing some pain and swelling along the medial side of the proximal flare of the tibia.  We aspirated out some purulent material and we went ahead and performed a superficial incision and drainage because this seemed to only go down to the subcutaneous tissue at that time.  She was placed on 2 weeks of IV vancomycin and doxycycline  and unfortunately over the last few days, the swelling recurred.  Because of this, the assumption is it is deep infection.  She was admitted to the hospital yesterday in preparation for radical irrigation and debridement after fluid samples were sent from my office.  Interestingly she has not really had much in the way of fever until just recently about 102.  Her sed rate which was 11 has gone up to 35 and again mostly consistent now with a deep infection.  Risks and benefits of surgery have been discussed.  Questions answered.  DESCRIPTION OF PROCEDURE:  The patient was identified by armband and received a femoral nerve block preoperatively, taken to the operating room 1 where the appropriate anesthetic monitors were attached.  General endotracheal anesthesia induced with the patient in supine position. Tourniquet was applied high to the right thigh.  Lateral post and foot positioner applied to the table.  Right lower extremity was prepped and draped in the usual sterile fashion from the ankle to the tourniquet. Time-out procedure performed.  Limb wrapped with Esmarch bandage, tourniquet inflated to 350 mmHg, and we began the procedure itself by recreating the anterior midline incision of the total knee arthroplasty. We  then went through the subcutaneous tissue, down to the transverse retinaculum and stopped there and reflected tissue medially until we encountered the abscess cavity which was actually fairly flattened and unfortunately has to explore the cavity.  One could actually palpate and then visualize part of the metal tibial base plate.  At this point, the assumption was this was a deep infection.  We went ahead and performed a medial parapatellar arthrotomy.  Interestingly, the synovial tissue was not particularly inflamed and this appeared to be more of a chronic smoldering type of process and an acute infection.  We then set about performing a complete synovectomy anteriorly,  medially, and laterally. We removed the polyethylene bearing and performed a posterior synovectomy as well.  Throughout this, most of the tissue appeared to be rather pale white, not particularly beefy red or inflamed looking. After we had debrided all inflamed-appearing tissue, we went ahead and irrigated alternating with hydrogen peroxide and normal saline pulse lavage and also did two 1-minute interval washes with Clorpactin to strip any biofilm.  After this thorough irrigation and debridement, we then inserted a new 15-mm MBT tibial bearing, closed the parapatellar arthrotomy with running #1 Vicryl suture after placing large 4 drains, closed the subcutaneous tissue with running 2-0 Vicryl suture in 2 layers, and left the skin loosely open in case there was any drainage. A dressing of 4 x 4s, Webril, and Ace wrap was then applied.  The tourniquet was let down.  The patient was awakened and extubated, taken to the recovery room without difficulty.     Kathalene Frames. Mayer Camel, M.D.     Alphonsus Sias  D:  09/12/2011  T:  09/13/2011  Job:  [id number]

## 2011-09-14 DIAGNOSIS — T8450XA Infection and inflammatory reaction due to unspecified internal joint prosthesis, initial encounter: Secondary | ICD-10-CM

## 2011-09-14 DIAGNOSIS — Y849 Medical procedure, unspecified as the cause of abnormal reaction of the patient, or of later complication, without mention of misadventure at the time of the procedure: Secondary | ICD-10-CM

## 2011-09-14 LAB — WOUND CULTURE

## 2011-09-14 LAB — TYPE AND SCREEN
ABO/RH(D): A POS
Antibody Screen: NEGATIVE
Unit division: 0

## 2011-09-14 LAB — CBC
HCT: 28.7 % — ABNORMAL LOW (ref 36.0–46.0)
Hemoglobin: 9.2 g/dL — ABNORMAL LOW (ref 12.0–15.0)
RBC: 3.43 MIL/uL — ABNORMAL LOW (ref 3.87–5.11)
WBC: 4.3 10*3/uL (ref 4.0–10.5)

## 2011-09-14 MED ORDER — PATIENT'S GUIDE TO USING COUMADIN BOOK
Freq: Once | Status: AC
  Administered 2011-09-14: 16:00:00
  Filled 2011-09-14: qty 1

## 2011-09-14 MED ORDER — WARFARIN VIDEO: Freq: Once | Status: DC

## 2011-09-14 MED ORDER — WARFARIN VIDEO
Freq: Once | Status: AC
  Administered 2011-09-15: 14:00:00

## 2011-09-14 MED ORDER — WARFARIN SODIUM 5 MG PO TABS
5.0000 mg | ORAL_TABLET | Freq: Once | ORAL | Status: AC
  Administered 2011-09-14: 5 mg via ORAL
  Filled 2011-09-14: qty 1

## 2011-09-14 NOTE — Progress Notes (Signed)
CARE MANAGEMENT NOTE 09/14/2011  Discharge planning. Patient is active with [facility].Has DME. Will need IV Vanc for 6 wks.Will need coumadin monitoring.

## 2011-09-14 NOTE — Progress Notes (Signed)
Occupational Therapy Evaluation Patient Details Name: [name] MRN: [id number] DOB: Mar 19, 1932 Today's Date: 09/14/2011 Time:1445 - 1515 30 min Pain - none  Problem List:  Patient Active Problem List  Diagnoses  . Infected prosthetic knee joint    Past Medical History:  Past Medical History  Diagnosis Date  . Diabetes mellitus    Past Surgical History:  Past Surgical History  Procedure Date  . Irrigation and debridement knee 09/12/2011    Procedure: IRRIGATION AND DEBRIDEMENT KNEE;  Surgeon: Kerin Salen;  Location: [facility];  Service: Orthopedics;  Laterality: Right;  I&D RIGHT TKA REVISE BEARINGS/MBT. Poly exchange    OT Assessment/Plan/Recommendation OT Assessment Clinical Impression Statement: Pt at indep to mod I level with most ADL. Pt's daughter plans to assist as needed and will be available 24/7. OT Recommendation/Assessment: Patient does not need any further OT services OT Recommendation Equipment Recommended:  ([employer]. Has all necessary equipment. recommended reacher) OT Evaluation Precautions/Restrictions  Precautions Precautions: Knee Precaution Comments: Do not force AROM Required Braces or Orthoses: Yes Knee Immobilizer: On when out of bed or walking Restrictions Weight Bearing Restrictions: No RLE Weight Bearing: Weight bearing as tolerated Prior Functioning Home Living Lives With: Daughter Receives Help From: Family Prior Function Level of Independence: Independent with basic ADLs;Independent with homemaking with ambulation;Requires assistive device for independence Vocation: Retired ADL ADL Eating/Feeding: Independent;Simulated Grooming: Performed Where Assessed - Grooming: Standing at sink Upper Body Bathing: Simulated;Independent Where Assessed - Upper Body Bathing: Sitting, chair Lower Body Bathing: Performed;Minimal assistance;Set up Lower Body Bathing Details (indicate cue type and reason): can reach foot with long handled sponge that  she has at home. Where Assessed - Lower Body Bathing: Sit to stand from chair Upper Body Dressing: Set up Where Assessed - Upper Body Dressing: Unsupported Lower Body Dressing: Minimal assistance Lower Body Dressing Details (indicate cue type and reason): Donning R sock. daughter will assist. Where Assessed - Lower Body Dressing: Sit to stand from bed Toilet Transfer: Supervision/safety Toilet Transfer Method: Ambulating Toilet Transfer Equipment: Bedside commode;Raised toilet seat with arms (or 3-in-1 over toilet) Toileting - Clothing Manipulation: Independent Where Assessed - Toileting Clothing Manipulation: Sit to stand from 3-in-1 or toilet Toileting - Hygiene: Performed Where [place]: Sit to stand from 3-in-1 or toilet Tub/Shower Transfer: Not assessed;Other (comment) (discussed with pt/daughter. Has wal in shower with showersea) ADL Comments: Daughter will be able to assist as needed with ADL. Recommended for pt to purchase reacher to help with reaching objects. Vision/Perception  Vision - History Baseline Vision: Wears glasses only for reading Cognition Cognition Arousal/Alertness: Awake/alert Overall Cognitive Status: Appears within functional limits for tasks assessed Sensation/Coordination Sensation Light Touch: Appears Intact Stereognosis: Appears Intact Hot/Cold: Appears Intact Proprioception: Appears Intact Coordination Gross Motor Movements are Fluid and Coordinated: Yes Fine Motor Movements are Fluid and Coordinated: Yes Extremity Assessment RUE Assessment RUE Assessment: Within Functional Limits LUE Assessment LUE Assessment: Within Functional Limits Mobility  Bed Mobility Supine to Sit: 6: Modified independent (Device/Increase time) Sitting - Scoot to Edge of Bed: 6: Modified independent (Device/Increase time) Transfers Sit to Stand: 6: Modified independent (Device/Increase time) Stand to Sit: 6: Modified independent (Device/Increase  time) Exercises Total Joint Exercises Quad Sets: AROM;15 reps;Right;Seated End of Session OT - End of Session Equipment Utilized During Treatment: Gait belt;Right knee immobilizer Activity Tolerance: Patient tolerated treatment well Patient left: in bed;with family/visitor present;with call bell in reach General Behavior During Session: [facility] for tasks performed Cognition: [facility] for tasks performed  Individual  session: ADL retraining. Pt able to return demonstrate and will have A for necessary tasks. Has all equipment. Daughter present for session.   Chaos Carlile,HILLARY 09/14/2011, 3:11 PM

## 2011-09-14 NOTE — Progress Notes (Signed)
Seen and agree with SPT note Lanetta Inch, [place]

## 2011-09-14 NOTE — Progress Notes (Signed)
Physical Therapy Discharge Note:  All education completed and goals met.  Patient with no further acute PT needs.  Updated discharge recommendation from HHPT to outpatient.  Acute PT will sign off. Barney Drain, SPT

## 2011-09-14 NOTE — Progress Notes (Signed)
ANTICOAGULATION CONSULT NOTE - Initial Consult  Pharmacy Consult for Coumadin x 2 weeks Indication: VTE prophylasix s/p I&D of infected TKA revision  Allergies  Allergen Reactions  . Hydrochlorothiazide Other (See Comments)    unknown  . Lipitor (Atorvastatin Calcium) Other (See Comments)    unknown  . Thiazide-Type Diuretics Other (See Comments)    unknown  . Zocor (Simvastatin) Swelling    Patient Measurements: Height: 5' 0.5" (153.7 cm) Weight: 188 lb 8 oz (85.503 kg) IBW/kg (Calculated) : 46.65   Vital Signs: Temp: 97.6 F (36.4 C) (12/21 1505) Temp src: Oral (12/21 1505) BP: 165/84 mmHg (12/21 1505) Pulse Rate: 88  (12/21 1505)  Labs:  Basename 09/14/11 0530 09/13/11 0610  HGB 9.2* 9.6*  HCT 28.7* 30.4*  PLT 212 221  APTT -- --  LABPROT -- --  INR -- --  HEPARINUNFRC -- --  CREATININE -- 0.74  CKTOTAL -- --  CKMB -- --  TROPONINI -- --   Estimated Creatinine Clearance: 57.8 ml/min (by C-G formula based on Cr of 0.74).  Medical History: Past Medical History  Diagnosis Date  . Diabetes mellitus     Medications:  Prescriptions prior to admission  Medication Sig Dispense Refill  . albuterol (PROVENTIL HFA;VENTOLIN HFA) 108 (90 BASE) MCG/ACT inhaler Inhale 2 puffs into the lungs every 6 (six) hours as needed. For shortness of breath       . ALPRAZolam (XANAX) 0.5 MG tablet Take 0.5 mg by mouth at bedtime as needed. For anxiety       . amLODipine (NORVASC) 10 MG tablet Take 10 mg by mouth daily.        Marland Kitchen atenolol (TENORMIN) 50 MG tablet Take 50 mg by mouth daily.        . cloNIDine (CATAPRES) 0.1 MG tablet Take 0.1 mg by mouth at bedtime.        . cyclobenzaprine (FLEXERIL) 5 MG tablet Take 5 mg by mouth 3 (three) times daily as needed. For pain       . esomeprazole (NEXIUM) 40 MG capsule Take 40 mg by mouth daily before breakfast.        . fluticasone (FLOVENT HFA) 110 MCG/ACT inhaler Inhale 1 puff into the lungs 2 (two) times daily.        Marland Kitchen gabapentin  (NEURONTIN) 300 MG capsule Take 300 mg by mouth 2 (two) times daily.        Marland Kitchen HYDROcodone-acetaminophen (NORCO) 10-325 MG per tablet Take 1 tablet by mouth every 6 (six) hours as needed. For pain         Assessment: 89 yof to begin coumadin for VTE prophylaxis s/p I&D of infected TKA revision. Baseline INR 1.06. No bleeding noted. Also on Lovenxo 30mg  SQ q12h until INR >/= 1.5.  Goal of Therapy:  INR 1.5-2 (per MD)   Plan:  1. Coumadin 5 mg po today 2. Daily INR 3. Coumadin book and video  Sindy Guadeloupe 09/14/2011,3:08 PM

## 2011-09-14 NOTE — Progress Notes (Signed)
Physical Therapy Treatment Patient Details Name: [name] MRN: [id number] DOB: [birth year] Today's Date: 09/14/2011  PT Assessment/Plan  PT - Assessment/Plan Comments on Treatment Session: Pt. did very well during today's treatment session.  Ambulated with modified independence with RW and performed stairs with two rails.  Encouraged pt. to continue to ambulate with nursing and to perform ROM exercises but to be sure she is not forcing any movement. PT Plan: All goals met and education completed, patient dischaged from PT services;Discharge plan needs to be updated Follow Up Recommendations: Outpatient PT Equipment Recommended: None recommended by PT PT Goals  Acute Rehab PT Goals PT Goal: Supine/Side to Sit - Progress: Other (comment) (Not addressed today. ) PT Goal: Ambulate - Progress: Met PT Goal: Up/Down Stairs - Progress: Met PT Goal: Perform Home Exercise Program - Progress: Met  PT Treatment Precautions/Restrictions  Precautions Precautions: Knee Precaution Comments: Do not force AROM Required Braces or Orthoses: Yes Knee Immobilizer: On when out of bed or walking Restrictions Weight Bearing Restrictions: No RLE Weight Bearing: Weight bearing as tolerated Mobility (including Balance) Bed Mobility Sitting - Scoot to Edge of Bed: 6: Modified independent (Device/Increase time) Transfers Transfers: Yes Sit to Stand: 6: Modified independent (Device/Increase time);From bed Stand to Sit: 6: Modified independent (Device/Increase time);To chair/3-in-1;With armrests Ambulation/Gait Ambulation/Gait: Yes Ambulation/Gait Assistance: 6: Modified independent (Device/Increase time) Ambulation Distance (Feet): 180 Feet Assistive device: Rolling walker Gait Pattern: Within Functional Limits Stairs: Yes Stairs Assistance: 6: Modified independent (Device/Increase time) Stair Management Technique: Two rails;Step to pattern;Forwards Number of Stairs: 2  (two trials) Height of  Stairs: 8     Exercise  Total Joint Exercises Quad Sets: AROM;15 reps;Right;Seated End of Session PT - End of Session Equipment Utilized During Treatment: Gait belt;Right knee immobilizer Activity Tolerance: Patient tolerated treatment well Patient left: in chair;with call bell in reach;with family/visitor present Nurse Communication: Mobility status for transfers;Mobility status for ambulation General Behavior During Session: [facility] for tasks performed Cognition: [facility] for tasks performed  Barney Drain, SPT  09/14/2011, 12:22 PM

## 2011-09-14 NOTE — Progress Notes (Signed)
Patient ID: [name], female   DOB: 27-Sep-1931, 77 y.o.   MRN: [id number] PATIENT ID: [name]  MRN: [id number]  DOB/AGE:  02/18/1932 / 77 y.o.  2 Days Post-Op Procedure(s) (LRB): IRRIGATION AND DEBRIDEMENT KNEE (Right)    PROGRESS NOTE Subjective: Patient is alert, oriented, no Nausea, no Vomiting, yes passing gas, yes Bowel Movement. Taking PO well. Denies SOB, Chest or Calf Pain. Using Incentive Spirometer, PAS in place. Ambulate WBAT, no CPM, Knee immobilizer when OOB Patient reports pain as 6 on 0-10 scale  .    Objective: Vital signs in last 24 hours: Filed Vitals:   09/13/11 1747 09/13/11 2119 09/13/11 2143 09/14/11 0538  BP: 169/56  178/65 169/54  Pulse: 81  80 73  Temp: 97.9 F (36.6 C)  98.5 F (36.9 C) 98 F (36.7 C)  TempSrc:   Oral Oral  Resp: 18  20 20   Height:      Weight:      SpO2: 98% 98% 98% 98%      Intake/Output from previous day: I/O last 3 completed shifts: In: 2062 [I.V.:2062] Out: 5290 [Urine:5010; Drains:280]   Intake/Output this shift:     LABORATORY DATA:  Basename 09/14/11 0530 09/13/11 0610  WBC 4.3 6.2  HGB 9.2* 9.6*  HCT 28.7* 30.4*  PLT 212 221  NA -- 136  K -- 3.5  CL -- 102  CO2 -- 26  BUN -- 12  CREATININE -- 0.74  GLUCOSE -- 137*  GLUCAP -- --  INR -- --  CALCIUM -- 8.4    Examination: Neurologically intact ABD soft Neurovascular intact Sensation intact distally Incision: dressing C/D/I No cellulitis present Compartment soft} Drains with seperated plasma. Cultures from office 12/17 neg, Intra Op 12/19 neg. Assessment:   2 Days Post-Op Procedure(s) (LRB): IRRIGATION AND DEBRIDEMENT KNEE (Right)  Plan: PT/OT WBAT, no CPM, knee immob when OOB for 2 weeks. Do not push motion as knee was infected. Drains will come out tomorrow or Sunday if < 50cc shift. IV Vanc and rifampin for 6 weeks Assuming cults remain negative. DVT Prophylaxis:  Lovenox\Coumadin bridge target INR 1.5-2.0 DISCHARGE PLAN:  Home DISCHARGE NEEDS: IV Antibiotics has all other devices at home.     Yoshito Gaza J 09/14/2011, 8:31 AM

## 2011-09-14 NOTE — Progress Notes (Signed)
Seen and agree with SPT note Lanetta Inch, [place]

## 2011-09-14 NOTE — Consult Note (Addendum)
Date of Admission:  09/10/2011  Date of Consult:  09/14/2011  Reason for Consult infected prosthetic knee Referring Physician: Frederik Pear, MD   HPI: [name] is an 77 y.o. female with right knee TKA that requried revision in August of 2012 after trauam. On 08/10/2011 for followup of revision right total knee arthroplasty that was done on 05/14/2011 and was found to have what was thoght to be a superficial wound infection10/09/12 treated with IV antibiotics for 2 weeks via a PICC line and with dramatic response to therapy. She was then changed to  oral doxycycline.  Cultures were neg. She did well until coming in for followup on 08/10/2011 when she saw Dr. Rip Harbour who aspirated out 2 mL of clear serosanguinous fluid from the pes anserine bursal region. This came back with 1800 white cells and was cultured negative. Her blood work has shown a sed rate of 12, CRP of 4 and a white count 4.6. In last few days pta she had increased swelling pain, fever to 102 despite continued doxycycline. Aspirate of pes bursa shows 25cc of yellow pus, sent for GS, cell count today in Dr. Damita Dunnings office which has failed to grow any organisms. She was admitted and underwent  IRRIGATION AND DEBRIDEMENT KNEE Revise Tibial component 90mm 2.5 MBT bearing. Repeat cultures were taken from OR. She is currently on vancomycin and rifampin.     Past Medical History  Diagnosis Date  . Diabetes mellitus   Hyperlipidemia  Past Surgical History  Procedure Date  . Irrigation and debridement knee 09/12/2011    Procedure: IRRIGATION AND DEBRIDEMENT KNEE;  Surgeon: Kerin Salen;  Location: [facility];  Service: Orthopedics;  Laterality: Right;  I&D RIGHT TKA REVISE BEARINGS/MBT. Poly exchange  ergies:   Allergies  Allergen Reactions  . Hydrochlorothiazide Other (See Comments)    unknown  . Lipitor (Atorvastatin Calcium) Other (See Comments)    unknown  . Thiazide-Type Diuretics Other (See Comments)    unknown  . Zocor  (Simvastatin) Swelling     Medications: I have reviewed patients current medications as documented in Epic Anti-infectives     Start     Dose/Rate Route Frequency Ordered Stop   09/14/11 0500   vancomycin (VANCOCIN) 1,250 mg in sodium chloride 0.9 % 250 mL IVPB        1,250 mg 166.7 mL/hr over 90 Minutes Intravenous Every 12 hours 09/13/11 1815     09/12/11 2200   rifampin (RIFADIN) capsule 300 mg        300 mg Oral Every 12 hours 09/12/11 2038     09/11/11 1600   vancomycin (VANCOCIN) 1,250 mg in sodium chloride 0.9 % 250 mL IVPB  Status:  Discontinued        1,250 mg 166.7 mL/hr over 90 Minutes Intravenous Every 24 hours 09/10/11 2049 09/13/11 1815   09/10/11 2000   vancomycin (VANCOCIN) IVPB 1000 mg/200 mL premix        1,000 mg 200 mL/hr over 60 Minutes Intravenous  Once 09/10/11 1924 09/10/11 2100          Social History:  reports that she has never smoked. She has never used smokeless tobacco. She reports that she does not drink alcohol or use illicit drugs.  No family history on file.  As in HPI and primary teams notes otherwise 12 point review of systems is negative  Blood pressure 165/84, pulse 88, temperature 97.6 F (36.4 C), temperature source Oral, resp. rate 20, height 5' 0.5" (1.537  m), weight 188 lb 8 oz (85.503 kg), SpO2 100.00%.  General: Alert and awake, oriented x3, not in any acute distress. HEENT: anicteric sclera, pupils reactive to light and accommodation, EOMI, oropharynx clear and without exudate CVS regular rate, normal r,  no murmur rubs or gallops Chest: clear to auscultation bilaterally, no wheezing, rales or rhonchi Abdomen: soft nontender, nondistended, normal bowel sounds, Extremities: right knee with dressing and drain Skin: no rashes Neuro: nonfocal, strength and sensation intact   Results for orders placed during the hospital encounter of 09/10/11 (from the past 48 hour(s))  WOUND CULTURE     Status: Normal   Collection Time    09/12/11  6:26 PM      Component Value Range Comment   Specimen Description WOUND KNEE RIGHT      Special Requests NONE      Gram Stain        Value: NO WBC SEEN     NO SQUAMOUS EPITHELIAL CELLS SEEN     NO ORGANISMS SEEN   Culture NO GROWTH 2 DAYS      Report Status 09/14/2011 FINAL     CBC     Status: Abnormal   Collection Time   09/13/11  6:10 AM      Component Value Range Comment   WBC 6.2  4.0 - 10.5 (K/uL)    RBC 3.64 (*) 3.87 - 5.11 (MIL/uL)    Hemoglobin 9.6 (*) 12.0 - 15.0 (g/dL)    HCT 30.4 (*) 36.0 - 46.0 (%)    MCV 83.5  78.0 - 100.0 (fL)    MCH 26.4  26.0 - 34.0 (pg)    MCHC 31.6  30.0 - 36.0 (g/dL)    RDW 15.4  11.5 - 15.5 (%)    Platelets 221  150 - 400 (K/uL)   BASIC METABOLIC PANEL     Status: Abnormal   Collection Time   09/13/11  6:10 AM      Component Value Range Comment   Sodium 136  135 - 145 (mEq/L)    Potassium 3.5  3.5 - 5.1 (mEq/L)    Chloride 102  96 - 112 (mEq/L)    CO2 26  19 - 32 (mEq/L)    Glucose, Bld 137 (*) 70 - 99 (mg/dL)    BUN 12  6 - 23 (mg/dL)    Creatinine, Ser 0.74  0.50 - 1.10 (mg/dL)    Calcium 8.4  8.4 - 10.5 (mg/dL)    GFR calc non Af Amer 80 (*) >90 (mL/min)    GFR calc Af Amer >90  >90 (mL/min)   VANCOMYCIN, TROUGH     Status: Abnormal   Collection Time   09/13/11  4:20 PM      Component Value Range Comment   Vancomycin Tr 8.0 (*) 10.0 - 20.0 (ug/mL)   CBC     Status: Abnormal   Collection Time   09/14/11  5:30 AM      Component Value Range Comment   WBC 4.3  4.0 - 10.5 (K/uL)    RBC 3.43 (*) 3.87 - 5.11 (MIL/uL)    Hemoglobin 9.2 (*) 12.0 - 15.0 (g/dL)    HCT 28.7 (*) 36.0 - 46.0 (%)    MCV 83.7  78.0 - 100.0 (fL)    MCH 26.8  26.0 - 34.0 (pg)    MCHC 32.1  30.0 - 36.0 (g/dL)    RDW 15.4  11.5 - 15.5 (%)    Platelets 212  150 - 400 (  K/uL)       Component Value Date/Time   SDES WOUND KNEE RIGHT 09/12/2011 1826   SPECREQUEST NONE 09/12/2011 1826   CULT NO GROWTH 2 DAYS 09/12/2011 1826   REPTSTATUS 09/14/2011  FINAL 09/12/2011 1826   No results found.   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 Mayer Camel and also with myself in [place] --will likely change over to an oral abx regimen post 6 weeks of therapy  Patient has hospital follow-up with me on 1/23 @ 4:15  and I put this information into the discharge manager.    Thank you so much for this interesting consult,   Alcide Evener 09/14/2011, 4:14 PM   [phone] (pager) [phone] (office)

## 2011-09-14 NOTE — Progress Notes (Signed)
Pts B/P at 2119 was 194/67; M. Carnaghi was notified and requested that retake her B/P twice. At 2225 B/P was 197/79 and at 2244 B/P of 208/61. Pt. was asymptomatic and stated that she felt fine. [place] notified [facility] and orders were given for IV pain medicine with B/P recheck. If B/P still elevated and order for IV Lebatalol was given.

## 2011-09-15 LAB — CBC
Hemoglobin: 9.8 g/dL — ABNORMAL LOW (ref 12.0–15.0)
MCH: 26.5 pg (ref 26.0–34.0)
RBC: 3.7 MIL/uL — ABNORMAL LOW (ref 3.87–5.11)

## 2011-09-15 LAB — PROTIME-INR
INR: 1 (ref 0.00–1.49)
Prothrombin Time: 13.4 seconds (ref 11.6–15.2)

## 2011-09-15 MED ORDER — LABETALOL HCL 5 MG/ML IV SOLN
10.0000 mg | INTRAVENOUS | Status: DC | PRN
  Administered 2011-09-15 – 2011-09-16 (×3): 10 mg via INTRAVENOUS
  Filled 2011-09-15 (×4): qty 4

## 2011-09-15 MED ORDER — WARFARIN SODIUM 5 MG PO TABS
5.0000 mg | ORAL_TABLET | Freq: Once | ORAL | Status: AC
  Administered 2011-09-15: 5 mg via ORAL
  Filled 2011-09-15: qty 1

## 2011-09-15 MED ORDER — ATENOLOL 50 MG PO TABS
75.0000 mg | ORAL_TABLET | Freq: Every day | ORAL | Status: DC
  Administered 2011-09-15 – 2011-09-16 (×2): 75 mg via ORAL
  Filled 2011-09-15 (×2): qty 1

## 2011-09-15 NOTE — Progress Notes (Signed)
ANTICOAGULATION CONSULT NOTE - Follow Up Consult  Pharmacy Consult for Coumadin x 2 weeks Indication: VTE prophylaxis s/p I&D of infected TKA revision  Allergies  Allergen Reactions  . Hydrochlorothiazide Other (See Comments)    unknown  . Lipitor (Atorvastatin Calcium) Other (See Comments)    unknown  . Thiazide-Type Diuretics Other (See Comments)    unknown  . Zocor (Simvastatin) Swelling    Patient Measurements: Height: 5' 0.5" (153.7 cm) Weight: 188 lb 8 oz (85.503 kg) IBW/kg (Calculated) : 46.65   Vital Signs: Temp: 98.7 F (37.1 C) (12/22 0532) Temp src: Oral (12/22 0532) BP: 175/64 mmHg (12/22 0532) Pulse Rate: 83  (12/22 0532)  Labs:  Basename 09/15/11 0500 09/14/11 0530 09/13/11 0610  HGB 9.8* 9.2* --  HCT 30.5* 28.7* 30.4*  PLT 264 212 221  APTT -- -- --  LABPROT 13.4 -- --  INR 1.00 -- --  HEPARINUNFRC -- -- --  CREATININE -- -- 0.74  CKTOTAL -- -- --  CKMB -- -- --  TROPONINI -- -- --   Estimated Creatinine Clearance: 57.8 ml/min (by C-G formula based on Cr of 0.74).   Medications:  Scheduled:    . amLODipine  10 mg Oral Daily  . atenolol  75 mg Oral Daily  . cloNIDine  0.1 mg Oral QHS  . enoxaparin  30 mg Subcutaneous Q12H  . fluticasone  1 puff Inhalation BID  . gabapentin  300 mg Oral BID  . pantoprazole  40 mg Oral Daily  . patient's guide to using coumadin book   Does not apply Once  . rifampin  300 mg Oral Q12H  . vancomycin  1,250 mg Intravenous Q12H  . warfarin  5 mg Oral ONCE-1800  . warfarin   Does not apply Once  . DISCONTD: atenolol  50 mg Oral Daily  . DISCONTD: warfarin   Does not apply Once   Pharmacist System-Based Medication Review: Anticoagulation: Coumadin for VTE px s/p I&D of infected TKA revision (plan for 2 wks). Baseline INR 1.09. Goal INR 1.5-2. INR today 1. Continue enox 30 q12 until INR 1.5. No bleeding noted. Book/video ordered. Coumadin 5mg  po today. Infectious Disease: Vanc D#6/Rifampin D#4 for infected TKA.  I&D 12/19. 12/19 wound cx neg. afebrile. wbc wnl. Vanco dose changed 12/20 for tr=8. Plan 6 weeks of vanc and rifampin per ID as outpatient.  Cardiovascular: Primary hypertension not well controlled. BP elevated at 175/64. Plan is to control with PRN labetolol and increase atenolol. F/u outpatient for further management of BP. Home Meds: clonidine/atenolol/norvasc Neurology: On home xanax, gabapentin. Pt states pain 5-7/10 -on Dilaudid (x 4 doses in 24 hours) and Norco (x4 doses in 24 hours) Nephrology: SCr 0.74/est CrCl ~67ml/min Pulmonary: H/o asthma - on home fluticasone, albuterol prn (no doses used in 24 hours)  Assessment: Pt is a 77 yo F being initiated on Coumadin for VTE prophylaxis s/p I&D of infected TKA revision. Bridging with Lovenox 30mg  SQ q12h until INR is >/= 1.5. INR is still subtherapeutic at 1. Pt with possible d/c tomorrow.   Goal of Therapy:  INR 1.5-2 (per MD)   Plan:  1. Coumadin 5mg  PO x 1 at 1800 2. Continue Lovenox 30mg  SQ q12h 3. Follow-up INR in am 4. Follow-up discharge plans  Addison Lank [place] 09/15/2011,1:45 PM

## 2011-09-15 NOTE — Consult Note (Signed)
PCP:  Dr. Daneil Dolin in [facility]    Reason for Consult: Hypertension Requesting Consult: Roselee Nova  HPI: [id number] with h/o HTN for past 74 yrs, admit with septic prosthetic knee, with acutely worsened HTN  tonight.   Pt had revision of right TKA in 0000000, course complicated by superficial wound infxn 06/2011 requiring  2wk course of ABx via PICC and oral doxycycline. She was then seen 07/2011 by Dr. Rip Harbour who  aspirated 2cc's of fluid from the pes anserine bursal area with 1800 WBC's, negative culture.  On  admission 12/17, pt had a few days of swelling, pain, and fever despite Doxy. Pes bursa aspirated  again. Admitted for I&D and IV ABx. Of note, ID was consulted 12/21 and have giver their  recommendations for presumed septic prosthetic knee.   We are consulted for HTN tonight. Review of chart shows pt's BP's have been 130-170 / 50-60's, with  most values in the 160-170's. Tonight she started going up to 190-200 / 60-70's, with peak 208/61.  Other vitals stable. This HTN was not clearly associated with pain, as the pt states that overall the  knee is sore, but not that painful, and is currently 3/10. She does however state that she is ready and  anxious to leave the hospital and wants to go, so that could be contributing. She denies having missed  any of her medications while admitted.   She denies any prior workup for secondary HTN, denies any problems with her kidneys or adrenal glands,  and states she was diagnosed in her 36's. Before admission, pt endorses having been on Amlodipine 10  mg, Atenolol 50 mg daily, and Clonidine 0.1 mg HS.   ROS is negative for any chest pain, SOB, dizziness, lightheadedness. She has basically been  asymptomatic from the HTN perspective, otherwise all other systems negative.    Past Medical History  Diagnosis Date  . Diabetes mellitus     Past Surgical History  Procedure Date  . Irrigation and debridement knee 09/12/2011   Procedure: IRRIGATION AND DEBRIDEMENT KNEE;  Surgeon: Kerin Salen;  Location: [facility];  Service: Orthopedics;  Laterality: Right;  I&D RIGHT TKA REVISE BEARINGS/MBT. Poly exchange    Medications:  HOME MEDS:  Prior to Admission medications   Medication Sig Start Date End Date Taking? Authorizing Provider  albuterol (PROVENTIL HFA;VENTOLIN HFA) 108 (90 BASE) MCG/ACT inhaler Inhale 2 puffs into the lungs every 6 (six) hours as needed. For shortness of breath    Yes Historical Provider, MD  ALPRAZolam Duanne Moron) 0.5 MG tablet Take 0.5 mg by mouth at bedtime as needed. For anxiety    Yes Historical Provider, MD  amLODipine (NORVASC) 10 MG tablet Take 10 mg by mouth daily.     Yes Historical Provider, MD  atenolol (TENORMIN) 50 MG tablet Take 50 mg by mouth daily.     Yes Historical Provider, MD  cloNIDine (CATAPRES) 0.1 MG tablet Take 0.1 mg by mouth at bedtime.     Yes Historical Provider, MD  cyclobenzaprine (FLEXERIL) 5 MG tablet Take 5 mg by mouth 3 (three) times daily as needed. For pain    Yes Historical Provider, MD  esomeprazole (NEXIUM) 40 MG capsule Take 40 mg by mouth daily before breakfast.     Yes Historical Provider, MD  fluticasone (FLOVENT HFA) 110 MCG/ACT inhaler Inhale 1 puff into the lungs 2 (two) times daily.     Yes Historical Provider, MD  gabapentin (NEURONTIN) 300 MG capsule Take 300 mg  by mouth 2 (two) times daily.     Yes Historical Provider, MD  HYDROcodone-acetaminophen (NORCO) 10-325 MG per tablet Take 1 tablet by mouth every 6 (six) hours as needed. For pain    Yes Historical Provider, MD    Allergies:  Allergies  Allergen Reactions  . Hydrochlorothiazide Other (See Comments)    unknown  . Lipitor (Atorvastatin Calcium) Other (See Comments)    unknown  . Thiazide-Type Diuretics Other (See Comments)    unknown  . Zocor (Simvastatin) Swelling    Social History:   reports that she has never smoked. She has never used smokeless tobacco. She reports that she does  not drink alcohol or use illicit drugs.  Family History: Non contributory  Physical Exam: Filed Vitals:   09/14/11 2244 09/14/11 2344 09/15/11 0049 09/15/11 0222  BP: 208/61 183/69 169/70 187/69  Pulse: 88 81 80 79  Temp:      TempSrc:      Resp:      Height:      Weight:      SpO2:       Blood pressure 187/69, pulse 79, temperature 98.4 F (36.9 C), temperature source Oral, resp. rate 20, height 5' 0.5" (1.537 m), weight 85.503 kg (188 lb 8 oz), SpO2 98.00%.  Gen: Elderly, pleasant F in no distress, able to relate history well, sleeping but awoken easily HEENT: PERRL ~6 mm, EOMI, sclera clear and normal appearing. Mouth a bit dry but overall normal Lungs: CTAB no w/c/r, grossly normal  Heart: RRR, no m/g, grossly normal Abd: Soft NT ND, benign exam Extrem: Warm, perfusing well, RLE in immobilizer with vacuum in place. No BLE edema noted in LLE Neuro: Alert, awake, conversant, pleasant. Moves upper extremities spontaneously, grossly normal.    Labs & Imaging Results for orders placed during the hospital encounter of 09/10/11 (from the past 48 hour(s))  CBC     Status: Abnormal   Collection Time   09/13/11  6:10 AM      Component Value Range Comment   WBC 6.2  4.0 - 10.5 (K/uL)    RBC 3.64 (*) 3.87 - 5.11 (MIL/uL)    Hemoglobin 9.6 (*) 12.0 - 15.0 (g/dL)    HCT 30.4 (*) 36.0 - 46.0 (%)    MCV 83.5  78.0 - 100.0 (fL)    MCH 26.4  26.0 - 34.0 (pg)    MCHC 31.6  30.0 - 36.0 (g/dL)    RDW 15.4  11.5 - 15.5 (%)    Platelets 221  150 - 400 (K/uL)   BASIC METABOLIC PANEL     Status: Abnormal   Collection Time   09/13/11  6:10 AM      Component Value Range Comment   Sodium 136  135 - 145 (mEq/L)    Potassium 3.5  3.5 - 5.1 (mEq/L)    Chloride 102  96 - 112 (mEq/L)    CO2 26  19 - 32 (mEq/L)    Glucose, Bld 137 (*) 70 - 99 (mg/dL)    BUN 12  6 - 23 (mg/dL)    Creatinine, Ser 0.74  0.50 - 1.10 (mg/dL)    Calcium 8.4  8.4 - 10.5 (mg/dL)    GFR calc non Af Amer 80 (*) >90  (mL/min)    GFR calc Af Amer >90  >90 (mL/min)   VANCOMYCIN, TROUGH     Status: Abnormal   Collection Time   09/13/11  4:20 PM      Component Value  Range Comment   Vancomycin Tr 8.0 (*) 10.0 - 20.0 (ug/mL)   CBC     Status: Abnormal   Collection Time   09/14/11  5:30 AM      Component Value Range Comment   WBC 4.3  4.0 - 10.5 (K/uL)    RBC 3.43 (*) 3.87 - 5.11 (MIL/uL)    Hemoglobin 9.2 (*) 12.0 - 15.0 (g/dL)    HCT 28.7 (*) 36.0 - 46.0 (%)    MCV 83.7  78.0 - 100.0 (fL)    MCH 26.8  26.0 - 34.0 (pg)    MCHC 32.1  30.0 - 36.0 (g/dL)    RDW 15.4  11.5 - 15.5 (%)    Platelets 212  150 - 400 (K/uL)    Impression  79yoF with h/o HTN for past 40 yrs, admit with septic prosthetic knee, with acutely worsened HTN  tonight.   Patient was consistently 160-170's through admission, which indicates poor control at baseline. Tonight  she spiked to 190-200's for which we are consulted. Given this acute change, would consider transient  factors more likely than acutely worsening primary HTN, factors such as pain, anxiety, measurement  error, overhydration with normal saline, or as a diagnosis of exclusion, just random error.   Overall, while the patient is admitted, I would treat blood pressures > 180 / 90 with IV Labetolol  (hold if HR < 55) and look for response within 5-15 minutes. I would otherwise just continue to watch  her for now. I would not start changing her outpatient regimen just yet, but have instructed the  patient to continue to monitor her BP's after discharge when she is back to her more comfortable home setting, and follow up with her PCP about whether her regimen needs adjustemnt.  Given the age of onset of HTN in her [id number], lack of metabolic derangements, and advanced age,  I would not recommend initiating workup for secondary HTN at this time.   - Labetalol 10 mg IV PRN SBP > 180 or DBP > 90, hold for HR < 55, recheck BP in 15 mins.  - If persistently elevated despite IV  boluses, will consider changing PO regimen  - Pain control per primary team, consider anxiety control if pt is amenable, and try to d/c as soon as  feasible - Minimize IVF's as long as patient can maintain her PO intake - Avoid measurement error: a BP cuff that is too small will falsely elevate BP. The bladder of a BP  cuff should wrap around at least 40% of the circumference of the arm  Thank you for involving [place] in the care of this very nice woman. I will have my daytime colleagues  check in to see if her BP resolves on its own, but don't hesitate to contact with any further questions.    Kamarii Carton 09/15/2011, 2:38 AM

## 2011-09-15 NOTE — Progress Notes (Signed)
Subjective: Doing well, no complaints  Objective: Vital signs in last 24 hours: Temp:  [97.6 F (36.4 C)-98.7 F (37.1 C)] 98.7 F (37.1 C) (12/22 0532) Pulse Rate:  [79-88] 83  (12/22 0532) Resp:  [18-20] 18  (12/22 0532) BP: (165-208)/(61-84) 175/64 mmHg (12/22 0532) SpO2:  [96 %-100 %] 96 % (12/22 0532) Weight change:  Last BM Date: 09/14/11  Intake/Output from previous day: 12/21 0701 - 12/22 0700 In: 2622 [P.O.:480; I.V.:2142] Out: 1440 [Urine:1325; Drains:115]    Physical Exam: General: Alert, awake, oriented x3, in no acute distress. HEENT: No bruits, no goiter. Heart: Regular rate and rhythm, without murmurs, rubs, gallops. Lungs: Clear to auscultation bilaterally. Abdomen: Soft, nontender, nondistended, positive bowel sounds. Extremities:Right Knee with immobilizer, trace right ankle edema Neuro: Grossly intact, nonfocal.  Lab Results: Basic Metabolic Panel:  Basename 09/13/11 0610  NA 136  K 3.5  CL 102  CO2 26  GLUCOSE 137*  BUN 12  CREATININE 0.74  CALCIUM 8.4  MG --  PHOS --   Liver Function Tests: No results found for this basename: AST:2,ALT:2,ALKPHOS:2,BILITOT:2,PROT:2,ALBUMIN:2 in the last 72 hours No results found for this basename: LIPASE:2,AMYLASE:2 in the last 72 hours No results found for this basename: AMMONIA:2 in the last 72 hours CBC:  Basename 09/15/11 0500 09/14/11 0530  WBC 5.7 4.3  NEUTROABS -- --  HGB 9.8* 9.2*  HCT 30.5* 28.7*  MCV 82.4 83.7  PLT 264 212   Cardiac Enzymes: No results found for this basename: CKTOTAL:3,CKMB:3,CKMBINDEX:3,TROPONINI:3 in the last 72 hours BNP: No components found with this basename: POCBNP:3 D-Dimer: No results found for this basename: DDIMER:2 in the last 72 hours CBG: No results found for this basename: GLUCAP:6 in the last 72 hours Hemoglobin A1C: No results found for this basename: HGBA1C in the last 72 hours Fasting Lipid Panel: No results found for this basename:  CHOL,HDL,LDLCALC,TRIG,CHOLHDL,LDLDIRECT in the last 72 hours Thyroid Function Tests: No results found for this basename: TSH,T4TOTAL,FREET4,T3FREE,THYROIDAB in the last 72 hours Anemia Panel: No results found for this basename: VITAMINB12,FOLATE,FERRITIN,TIBC,IRON,RETICCTPCT in the last 72 hours Coagulation:  Basename 09/15/11 0500  LABPROT 13.4  INR 1.00   Urine Drug Screen: Drugs of Abuse  No results found for this basename: labopia, cocainscrnur, labbenz, amphetmu, thcu, labbarb    Alcohol Level: No results found for this basename: ETH:2 in the last 72 hours  Recent Results (from the past 240 hour(s))  WOUND CULTURE     Status: Normal   Collection Time   09/12/11  6:26 PM      Component Value Range Status Comment   Specimen Description WOUND KNEE RIGHT   Final    Special Requests NONE   Final    Gram Stain     Final    Value: NO WBC SEEN     NO SQUAMOUS EPITHELIAL CELLS SEEN     NO ORGANISMS SEEN   Culture NO GROWTH 2 DAYS   Final    Report Status 09/14/2011 FINAL   Final     Studies/Results: No results found.  Medications: Scheduled Meds:   . amLODipine  10 mg Oral Daily  . atenolol  50 mg Oral Daily  . cloNIDine  0.1 mg Oral QHS  . enoxaparin  30 mg Subcutaneous Q12H  . fluticasone  1 puff Inhalation BID  . gabapentin  300 mg Oral BID  . pantoprazole  40 mg Oral Daily  . patient's guide to using coumadin book   Does not apply Once  . rifampin  300 mg Oral Q12H  . vancomycin  1,250 mg Intravenous Q12H  . warfarin  5 mg Oral ONCE-1800  . warfarin   Does not apply Once  . DISCONTD: warfarin   Does not apply Once   Continuous Infusions:   . dextrose 5 % and 0.45 % NaCl with KCl 20 mEq/L 100 mL/hr at 09/14/11 2232   PRN Meds:.acetaminophen, acetaminophen, albuterol, ALPRAZolam, cyclobenzaprine, diphenhydrAMINE, HYDROcodone-acetaminophen, HYDROmorphone, labetalol, menthol-cetylpyridinium, metoCLOPramide (REGLAN) injection, metoCLOPramide, ondansetron (ZOFRAN)  IV, ondansetron (ZOFRAN) IV, ondansetron, phenol, sodium chloride, zolpidem  Assessment/Plan:  1.Infected prosthetic knee joint: s/p Irrigation and debridement, for 6 weeks of vancomycin and rifampin per Dr. Mayer Camel and ID. 2. Uncontrolled Hypertension: will decrease IVF to [facility], and increase Atenolol to 75mg /day, this dose can be continued at discharge    LOS: 5 days   [facility] Pager: [phone] 09/15/2011, 8:51 AM

## 2011-09-15 NOTE — Progress Notes (Signed)
Subjective: 3 Days Post-Op Procedure(s) (LRB): IRRIGATION AND DEBRIDEMENT KNEE (Right)  Activity level: OOB Diet tolerance:regular Voiding with or without catheter:voiding well Patient reports pain as mild.    Objective: Vital signs in last 24 hours: Temp:  [97.6 F (36.4 C)-98.7 F (37.1 C)] 98.7 F (37.1 C) (12/22 0532) Pulse Rate:  [79-88] 83  (12/22 0532) Resp:  [18-20] 18  (12/22 0532) BP: (165-208)/(61-84) 175/64 mmHg (12/22 0532) SpO2:  [96 %-100 %] 96 % (12/22 0532)  Intake/Output from previous day: 12/21 0701 - 12/22 0700 In: 2622 [P.O.:480; I.V.:2142] Out: 1440 [Urine:1325; Drains:115] Intake/Output this shift:     Basename 09/15/11 0500 09/14/11 0530 09/13/11 0610  HGB 9.8* 9.2* 9.6*    Basename 09/15/11 0500 09/14/11 0530  WBC 5.7 4.3  RBC 3.70* 3.43*  HCT 30.5* 28.7*  PLT 264 212    Basename 09/13/11 0610  NA 136  K 3.5  CL 102  CO2 26  BUN 12  CREATININE 0.74  GLUCOSE 137*  CALCIUM 8.4    Basename 09/15/11 0500  LABPT --  INR 1.00    Neurologically intact ABD soft Neurovascular intact Sensation intact distally Intact pulses distally Dorsiflexion/Plantar flexion intact Incision: no drainage  Assessment/Plan: 3 Days Post-Op Procedure(s) (LRB): IRRIGATION AND DEBRIDEMENT KNEE (Right) Up with therapy Plan for discharge tomorrow Drains pulled today  [name] G 09/15/2011, 8:32 AM

## 2011-09-16 LAB — PROTIME-INR
INR: 1.08 (ref 0.00–1.49)
Prothrombin Time: 14.2 seconds (ref 11.6–15.2)

## 2011-09-16 MED ORDER — WARFARIN SODIUM 5 MG PO TABS
5.0000 mg | ORAL_TABLET | Freq: Once | ORAL | Status: AC
  Administered 2011-09-16: 5 mg via ORAL
  Filled 2011-09-16: qty 1

## 2011-09-16 MED ORDER — CLONIDINE HCL 0.1 MG PO TABS: 0.1000 mg | ORAL_TABLET | Freq: Two times a day (BID) | ORAL | Status: DC

## 2011-09-16 MED ORDER — WARFARIN SODIUM 5 MG PO TABS: ORAL_TABLET | ORAL | Status: DC

## 2011-09-16 MED ORDER — ATENOLOL 25 MG PO TABS: 25.0000 mg | ORAL_TABLET | Freq: Every day | ORAL | Status: DC

## 2011-09-16 MED ORDER — RIFAMPIN 300 MG PO CAPS: 300.0000 mg | ORAL_CAPSULE | Freq: Two times a day (BID) | ORAL | Status: DC

## 2011-09-16 MED ORDER — HEPARIN SOD (PORK) LOCK FLUSH 100 UNIT/ML IV SOLN
250.0000 [IU] | INTRAVENOUS | Status: AC | PRN
  Administered 2011-09-16: 250 [IU]

## 2011-09-16 MED ORDER — HYDROCODONE-ACETAMINOPHEN 10-325 MG PO TABS
1.0000 | ORAL_TABLET | Freq: Four times a day (QID) | ORAL | Status: DC | PRN
  Administered 2011-09-16 (×2): 1 via ORAL
  Filled 2011-09-16: qty 1
  Filled 2011-09-16: qty 2

## 2011-09-16 MED ORDER — VANCOMYCIN HCL 1000 MG IV SOLR: INTRAVENOUS | Status: DC

## 2011-09-16 MED ORDER — CLONIDINE HCL 0.1 MG PO TABS
0.1000 mg | ORAL_TABLET | Freq: Two times a day (BID) | ORAL | Status: DC
  Administered 2011-09-16: 0.1 mg via ORAL
  Filled 2011-09-16 (×2): qty 1

## 2011-09-16 MED ORDER — HYDROCODONE-ACETAMINOPHEN 10-325 MG PO TABS: 1.0000 | ORAL_TABLET | Freq: Four times a day (QID) | ORAL | Status: AC | PRN

## 2011-09-16 MED ORDER — ALPRAZOLAM 0.5 MG PO TABS
0.5000 mg | ORAL_TABLET | Freq: Four times a day (QID) | ORAL | Status: DC | PRN
  Administered 2011-09-16: 0.5 mg via ORAL
  Filled 2011-09-16: qty 1

## 2011-09-16 NOTE — Progress Notes (Signed)
Subjective: Doing well, no complaints, BP was up last night and this am, anxious and in pain too  Objective: Vital signs in last 24 hours: Temp:  [97.9 F (36.6 C)-98.4 F (36.9 C)] 98.3 F (36.8 C) (12/23 0548) Pulse Rate:  [75-91] 84  (12/23 0548) Resp:  [18-20] 18  (12/23 0548) BP: (170-220)/(62-96) 186/69 mmHg (12/23 0548) SpO2:  [97 %-98 %] 98 % (12/23 0846) Weight change:  Last BM Date: 09/14/11  Intake/Output from previous day: 12/22 0701 - 12/23 0700 In: 600 [P.O.:600] Out: 0     Physical Exam: General: Alert, awake, oriented x3, in no acute distress. HEENT: No bruits, no goiter. Heart: Regular rate and rhythm, without murmurs, rubs, gallops. Lungs: Clear to auscultation bilaterally. Abdomen: Soft, nontender, nondistended, positive bowel sounds. Extremities:Right Knee with immobilizer, trace right ankle edema Neuro: Grossly intact, nonfocal.  Lab Results: Basic Metabolic Panel: No results found for this basename: NA:2,K:2,CL:2,CO2:2,GLUCOSE:2,BUN:2,CREATININE:2,CALCIUM:2,MG:2,PHOS:2 in the last 72 hours Liver Function Tests: No results found for this basename: AST:2,ALT:2,ALKPHOS:2,BILITOT:2,PROT:2,ALBUMIN:2 in the last 72 hours No results found for this basename: LIPASE:2,AMYLASE:2 in the last 72 hours No results found for this basename: AMMONIA:2 in the last 72 hours CBC:  Basename 09/15/11 0500 09/14/11 0530  WBC 5.7 4.3  NEUTROABS -- --  HGB 9.8* 9.2*  HCT 30.5* 28.7*  MCV 82.4 83.7  PLT 264 212   Cardiac Enzymes: No results found for this basename: CKTOTAL:3,CKMB:3,CKMBINDEX:3,TROPONINI:3 in the last 72 hours BNP: No components found with this basename: POCBNP:3 D-Dimer: No results found for this basename: DDIMER:2 in the last 72 hours CBG: No results found for this basename: GLUCAP:6 in the last 72 hours Hemoglobin A1C: No results found for this basename: HGBA1C in the last 72 hours Fasting Lipid Panel: No results found for this basename:  CHOL,HDL,LDLCALC,TRIG,CHOLHDL,LDLDIRECT in the last 72 hours Thyroid Function Tests: No results found for this basename: TSH,T4TOTAL,FREET4,T3FREE,THYROIDAB in the last 72 hours Anemia Panel: No results found for this basename: VITAMINB12,FOLATE,FERRITIN,TIBC,IRON,RETICCTPCT in the last 72 hours Coagulation:  Basename 09/16/11 0500 09/15/11 0500  LABPROT 14.2 13.4  INR 1.08 1.00   Urine Drug Screen: Drugs of Abuse  No results found for this basename: labopia,  cocainscrnur,  labbenz,  amphetmu,  thcu,  labbarb    Alcohol Level: No results found for this basename: ETH:2 in the last 72 hours  Recent Results (from the past 240 hour(s))  WOUND CULTURE     Status: Normal   Collection Time   09/12/11  6:26 PM      Component Value Range Status Comment   Specimen Description WOUND KNEE RIGHT   Final    Special Requests NONE   Final    Gram Stain     Final    Value: NO WBC SEEN     NO SQUAMOUS EPITHELIAL CELLS SEEN     NO ORGANISMS SEEN   Culture NO GROWTH 2 DAYS   Final    Report Status 09/14/2011 FINAL   Final     Studies/Results: No results found.  Medications: Scheduled Meds:    . amLODipine  10 mg Oral Daily  . atenolol  75 mg Oral Daily  . cloNIDine  0.1 mg Oral BID  . enoxaparin  30 mg Subcutaneous Q12H  . fluticasone  1 puff Inhalation BID  . gabapentin  300 mg Oral BID  . pantoprazole  40 mg Oral Daily  . rifampin  300 mg Oral Q12H  . vancomycin  1,250 mg Intravenous Q12H  . warfarin  5 mg Oral ONCE-1800  . warfarin   Does not apply Once  . DISCONTD: cloNIDine  0.1 mg Oral QHS   Continuous Infusions:    . dextrose 5 % and 0.45 % NaCl with KCl 20 mEq/L 100 mL/hr at 09/14/11 2232   PRN Meds:.acetaminophen, acetaminophen, albuterol, ALPRAZolam, cyclobenzaprine, diphenhydrAMINE, HYDROcodone-acetaminophen, HYDROmorphone, labetalol, menthol-cetylpyridinium, metoCLOPramide (REGLAN) injection, metoCLOPramide, ondansetron (ZOFRAN) IV, ondansetron (ZOFRAN) IV,  ondansetron, phenol, sodium chloride, zolpidem, DISCONTD: ALPRAZolam, DISCONTD: HYDROcodone-acetaminophen  Assessment/Plan:  1.Infected prosthetic knee joint: s/p Irrigation and debridement, for 6 weeks of vancomycin and rifampin per Dr. Mayer Camel and ID. 2. Uncontrolled Hypertension: continue atenolol 75mg , increase clonidine to BID, will add hydralazine if this persists despite change in clonidine Would hold discharge till later this evening if BP better or tomorrow Will follow   LOS: 6 days   [facility] Pager: [phone] 09/16/2011, 8:59 AM

## 2011-09-16 NOTE — Progress Notes (Signed)
Subjective: No new complaints  Objective: Weight change:   Intake/Output Summary (Last 24 hours) at 09/16/11 1450 Last data filed at 09/16/11 1354  Gross per 24 hour  Intake    600 ml  Output      0 ml  Net    600 ml   Blood pressure 157/52, pulse 78, temperature 97.8 F (36.6 C), temperature source Oral, resp. rate 18, height 5' 0.5" (1.537 m), weight 188 lb 8 oz (85.503 kg), SpO2 95.00%. Temp:  [97.8 F (36.6 C)-98.3 F (36.8 C)] 97.8 F (36.6 C) (12/23 1353) Pulse Rate:  [78-91] 78  (12/23 1353) Resp:  [18-20] 18  (12/23 1353) BP: (157-220)/(52-96) 157/52 mmHg (12/23 1353) SpO2:  [95 %-98 %] 95 % (12/23 1353)  Physical Exam: General: Alert and awake, oriented x3, not in any acute distress. HEENT: anicteric sclera, pupils reactive to light and accommodation, EOMI CVS regular rate, normal r,  no murmur rubs or gallops Chest: clear to auscultation bilaterally, no wheezing, rales or rhonchi Abdomen: soft nontender, nondistended, normal bowel sounds, Extremities: no  clubbing or edema noted bilaterally Skin: no rashes Lymph: no new lymphadenopathy Neuro: nonfocal  Lab Results:  Basename 09/15/11 0500 09/14/11 0530  WBC 5.7 4.3  HGB 9.8* 9.2*  HCT 30.5* 28.7*  PLT 264 212   BMET No results found for this basename: NA:2,K:2,CL:2,CO2:2,GLUCOSE:2,BUN:2,CREATININE:2,CALCIUM:2 in the last 72 hours  Micro Results: Recent Results (from the past 240 hour(s))  WOUND CULTURE     Status: Normal   Collection Time   09/12/11  6:26 PM      Component Value Range Status Comment   Specimen Description WOUND KNEE RIGHT   Final    Special Requests NONE   Final    Gram Stain     Final    Value: NO WBC SEEN     NO SQUAMOUS EPITHELIAL CELLS SEEN     NO ORGANISMS SEEN   Culture NO GROWTH 2 DAYS   Final    Report Status 09/14/2011 FINAL   Final     Studies/Results:  Chest 2 View  09/10/2011  *RADIOLOGY REPORT*  Clinical Data: Preop for knee surgery  CHEST - 2 VIEW  Comparison:  07/04/2011  Findings: Mild cardiac enlargement with vascular congestion and basilar atelectasis.  No definite CHF, pneumonia, collapse, consolidation, effusion or pneumothorax.  Degenerative changes of the spine and right shoulder.  IMPRESSION: Low volume exam.  Cardiomegaly without CHF or pneumonia  Basilar atelectasis  Original Report Authenticated By: Jerilynn Mages. Daryll Brod, M.D.    Antibiotics:  Anti-infectives     Start     Dose/Rate Route Frequency Ordered Stop   09/16/11 0000   rifampin (RIFADIN) 300 MG capsule        300 mg Oral Every 12 hours 09/16/11 1131 09/26/11 2359   09/16/11 0000   sodium chloride 0.9 % SOLN 250 mL with vancomycin 1000 MG SOLR        166.7 mL/hr over 90 Minutes   09/16/11 1131     09/14/11 0500   vancomycin (VANCOCIN) 1,250 mg in sodium chloride 0.9 % 250 mL IVPB        1,250 mg 166.7 mL/hr over 90 Minutes Intravenous Every 12 hours 09/13/11 1815     09/12/11 2200   rifampin (RIFADIN) capsule 300 mg        300 mg Oral Every 12 hours 09/12/11 2038     09/11/11 1600   vancomycin (VANCOCIN) 1,250 mg in sodium chloride 0.9 % 250  mL IVPB  Status:  Discontinued        1,250 mg 166.7 mL/hr over 90 Minutes Intravenous Every 24 hours 09/10/11 2049 09/13/11 1815   09/10/11 2000   vancomycin (VANCOCIN) IVPB 1000 mg/200 mL premix        1,000 mg 200 mL/hr over 60 Minutes Intravenous  Once 09/10/11 1924 09/10/11 2100          Medications: Scheduled Meds:   . amLODipine  10 mg Oral Daily  . atenolol  75 mg Oral Daily  . cloNIDine  0.1 mg Oral BID  . enoxaparin  30 mg Subcutaneous Q12H  . fluticasone  1 puff Inhalation BID  . gabapentin  300 mg Oral BID  . pantoprazole  40 mg Oral Daily  . rifampin  300 mg Oral Q12H  . vancomycin  1,250 mg Intravenous Q12H  . warfarin  5 mg Oral ONCE-1800  . warfarin  5 mg Oral ONCE-1800  . DISCONTD: cloNIDine  0.1 mg Oral QHS   Continuous Infusions:   . dextrose 5 % and 0.45 % NaCl with KCl 20 mEq/L 100 mL/hr at 09/14/11  2232   PRN Meds:.acetaminophen, acetaminophen, albuterol, ALPRAZolam, cyclobenzaprine, diphenhydrAMINE, HYDROcodone-acetaminophen, HYDROmorphone, labetalol, menthol-cetylpyridinium, metoCLOPramide (REGLAN) injection, metoCLOPramide, ondansetron (ZOFRAN) IV, ondansetron (ZOFRAN) IV, ondansetron, phenol, sodium chloride, zolpidem, DISCONTD: ALPRAZolam, DISCONTD: HYDROcodone-acetaminophen  Assessment/Plan: [name] is a 77 y.o. 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 Her Cultures from Dec 17th in Dr Damita Dunnings office.  1) Septic prosthetic knee:  --agree with vancomycin and rifampin x 6 weeks with followup by Dr. Mayer Camel and also with myself in [place]  --will likely change over to an oral abx regimen post 6 weeks of therapy   Patient has hospital follow-up with me on 1/23 @ 4:15 and she has my card with this info,  I also put this information into the discharge manager.  I will sign off for now. Please call with further questions.   LOS: 6 days   Alcide Evener 09/16/2011, 2:50 PM

## 2011-09-16 NOTE — Progress Notes (Signed)
ANTICOAGULATION & ANTIBIOTIC CONSULT NOTE - FOLLOW UP  Pharmacy Consult for Vancomycin/Coumadin (x2 weeks) Indication: Infected R TKA; VTE prophylaxis s/p I&D of infected TKA revision   Allergies  Allergen Reactions  . Hydrochlorothiazide Other (See Comments)    unknown  . Lipitor (Atorvastatin Calcium) Other (See Comments)    unknown  . Thiazide-Type Diuretics Other (See Comments)    unknown  . Zocor (Simvastatin) Swelling    Patient Measurements: Height: 5' 0.5" (153.7 cm) Weight: 188 lb 8 oz (85.503 kg) IBW/kg (Calculated) : 46.65  Adjusted Body Weight:   Vital Signs: Temp: 97.8 F (36.6 C) (12/23 1353) Temp src: Oral (12/23 1353) BP: 157/52 mmHg (12/23 1353) Pulse Rate: 78  (12/23 1353) Intake/Output from previous day: 12/22 0701 - 12/23 0700 In: 600 [P.O.:600] Out: 0  Intake/Output from this shift: Total I/O In: 600 [P.O.:600] Out: -   Labs:  Basename 09/15/11 0500 09/14/11 0530  WBC 5.7 4.3  HGB 9.8* 9.2*  PLT 264 212  LABCREA -- --  CREATININE -- --   Estimated Creatinine Clearance: 57.8 ml/min (by C-G formula based on Cr of 0.74).  Basename 09/13/11 1620  VANCOTROUGH 8.0*  VANCOPEAK --  Jake Michaelis --  GENTTROUGH --  GENTPEAK --  GENTRANDOM --  TOBRATROUGH --  TOBRAPEAK --  TOBRARND --  AMIKACINPEAK --  AMIKACINTROU --  AMIKACIN --     Microbiology: Recent Results (from the past 720 hour(s))  WOUND CULTURE     Status: Normal   Collection Time   09/12/11  6:26 PM      Component Value Range Status Comment   Specimen Description WOUND KNEE RIGHT   Final    Special Requests NONE   Final    Gram Stain     Final    Value: NO WBC SEEN     NO SQUAMOUS EPITHELIAL CELLS SEEN     NO ORGANISMS SEEN   Culture NO GROWTH 2 DAYS   Final    Report Status 09/14/2011 FINAL   Final     Anti-infectives     Start     Dose/Rate Route Frequency Ordered Stop   09/16/11 0000   rifampin (RIFADIN) 300 MG capsule        300 mg Oral Every 12 hours  09/16/11 1131 09/26/11 2359   09/16/11 0000   sodium chloride 0.9 % SOLN 250 mL with vancomycin 1000 MG SOLR        166.7 mL/hr over 90 Minutes   09/16/11 1131     09/14/11 0500   vancomycin (VANCOCIN) 1,250 mg in sodium chloride 0.9 % 250 mL IVPB        1,250 mg 166.7 mL/hr over 90 Minutes Intravenous Every 12 hours 09/13/11 1815     09/12/11 2200   rifampin (RIFADIN) capsule 300 mg        300 mg Oral Every 12 hours 09/12/11 2038     09/11/11 1600   vancomycin (VANCOCIN) 1,250 mg in sodium chloride 0.9 % 250 mL IVPB  Status:  Discontinued        1,250 mg 166.7 mL/hr over 90 Minutes Intravenous Every 24 hours 09/10/11 2049 09/13/11 1815   09/10/11 2000   vancomycin (VANCOCIN) IVPB 1000 mg/200 mL premix        1,000 mg 200 mL/hr over 60 Minutes Intravenous  Once 09/10/11 1924 09/10/11 2100          Assessment: 77 yo F to continue vancomycin and coumadin for VTE prophylaxis s/p I&D of  infected TKA revision. INR today subtherapeutic at 1.08 (near baseline). Hgb and plts stable. On Lovenox 30mg  SQ q12h until INR >/= 1.5. Pt is also afebrile with wbc wnl.   Pharmacist System-Based Medication Review: Anticoagulation: Coumadin for VTE px s/p I&D of infected TKA revision (plan for 2 wks). Baseline INR 1.09. Goal INR 1.5-2. INR today 1.08. Continue enox 30 q12 until INR 1.5. CBC stable. Infectious Disease: Vanc D#7/Rifampin D#5 for infected TKA. I&D 12/19. 12/19 wound cx neg. afebrile. wbc wnl. Vanco dose changed 12/20 for tr=8. Plan 6 weeks of vanc and rifampin per ID as outpatient.  Cardiovascular: Primary hypertension not well controlled. HLD. BP elevated last night this AM, improving. Plan to control with PRN labetolol and increased atenolol yesterday, increase clonidine to BID; considering addition of hydralazine. F/u outpatient for further management of BP. Home Meds: clonidine/atenolol/norvasc.  Neurology: On home xanax, gabapentin. Pain 0-2/10 -on Dilaudid (x 3 doses in 24 hours) and  Norco (x4 doses in 24 hours). Anxious and in pain, xanax increased. Nephrology: SCr 0.74 on 12/20/est CrCl ~66ml/min; lytes wnl Pulmonary: H/o asthma - on home fluticasone, albuterol prn (no doses used in 24 hours)  PTA Medication Issues: None Best Practices: SCDs, lovenox, ppi po   Goal of Therapy:  Vancomycin trough level 15-20 mcg/ml INR goal 1.5-2.0 (per MD)  Plan:  1) Continue vancomycin 1250 mg IV q12h 2) Check vancomycin trough at 0430 tomorrow AM if still here 3) Coumadin 5 mg PO x1 4) F/u AM labs; s/sx of infection 5) F/u discharge plans  Allene Pyo 09/16/2011,2:02 PM

## 2011-09-16 NOTE — Progress Notes (Signed)
   CARE MANAGEMENT NOTE 09/16/2011  Patient:  [name], [name]   Account Number:  [id number]  Date Initiated:  09/14/2011  Documentation initiated by:  Ricki Miller  Subjective/Objective Assessment:   3 yr olf female s/p infected right total knee     Action/Plan:   Discharge planning. Patient is active with [facility].Has DME. Will need IV Vanc for 6 wks.Will need coumadin monitoring.   Anticipated DC Date:  09/15/2011   Anticipated DC Plan:  [place]  CM consult      [facility] Choice  HOME HEALTH   Choice offered to / List presented to:          [facility] arranged  [place] RN      [place].   Status of service:  Completed, signed off Medicare Important Message given?  NO (If response is "NO", the following Medicare IM given date fields will be blank) Date Medicare IM given:   Date Additional Medicare IM given:    Discharge Disposition:  [place]  Per UR Regulation:    Comments:  09/16/11 1710--Spoke with [facility] in reference to Vancomycin orders.  Faxed PICC line information, and Vancomycin prescription to [phone]). Llana Aliment, RN, BSN Case Manager [phone]   09/16/11 1650--Spoke with Katharine Look, RN taking care of pt., about her discharge today.  She stated she had given her antibiotic dose today and pt. did not need another dose until tomorrow 12/24.  TC to [place] with [facility] to make aware of this information about today's antiobiotic dose, and that pt. would be discharged this evening.  Llana Aliment, RN, BSN Case Manager # [phone]

## 2011-09-16 NOTE — Progress Notes (Signed)
Subjective: 4 Days Post-Op Procedure(s) (LRB): IRRIGATION AND DEBRIDEMENT KNEE (Right)  Activity level: oob wbat Diet tolerance:eating Voiding with or without catheter:voiding Patient reports pain as 2 on 0-10 scale.    Objective: Vital signs in last 24 hours: Temp:  [97.9 F (36.6 C)-98.4 F (36.9 C)] 98.3 F (36.8 C) (12/23 0548) Pulse Rate:  [75-91] 84  (12/23 0548) Resp:  [18-20] 18  (12/23 0548) BP: (170-220)/(62-96) 186/82 mmHg (12/23 0957) SpO2:  [97 %-98 %] 98 % (12/23 0846)  Intake/Output from previous day: 12/22 0701 - 12/23 0700 In: 600 [P.O.:600] Out: 0  Intake/Output this shift:     Basename 09/15/11 0500 09/14/11 0530  HGB 9.8* 9.2*    Basename 09/15/11 0500 09/14/11 0530  WBC 5.7 4.3  RBC 3.70* 3.43*  HCT 30.5* 28.7*  PLT 264 212   No results found for this basename: NA:2,K:2,CL:2,CO2:2,BUN:2,CREATININE:2,GLUCOSE:2,CALCIUM:2 in the last 72 hours  Basename 09/16/11 0500 09/15/11 0500  LABPT -- --  INR 1.08 1.00    Neurologically intact ABD soft Neurovascular intact Sensation intact distally Intact pulses distally Dorsiflexion/Plantar flexion intact Incision: no drainage No cellulitis present Compartment soft  Assessment/Plan: 4 Days Post-Op Procedure(s) (LRB): IRRIGATION AND DEBRIDEMENT KNEE (Right) Advance diet Up with therapy If bp better discharge per med service  Shelisa Fern R 09/16/2011, 11:12 AM

## 2011-10-03 ENCOUNTER — Encounter: Payer: Self-pay | Admitting: Infectious Disease

## 2011-10-16 ENCOUNTER — Encounter: Payer: Self-pay | Admitting: Infectious Disease

## 2011-10-17 ENCOUNTER — Encounter: Payer: Self-pay | Admitting: Infectious Disease

## 2011-10-17 ENCOUNTER — Ambulatory Visit (INDEPENDENT_AMBULATORY_CARE_PROVIDER_SITE_OTHER): Payer: Medicare Other | Admitting: Infectious Disease

## 2011-10-17 VITALS — BP 173/81 | HR 64 | Temp 97.4°F | Ht 64.0 in | Wt 188.0 lb

## 2011-10-17 DIAGNOSIS — Z96659 Presence of unspecified artificial knee joint: Secondary | ICD-10-CM

## 2011-10-17 DIAGNOSIS — I10 Essential (primary) hypertension: Secondary | ICD-10-CM | POA: Insufficient documentation

## 2011-10-17 DIAGNOSIS — T8459XA Infection and inflammatory reaction due to other internal joint prosthesis, initial encounter: Secondary | ICD-10-CM

## 2011-10-17 DIAGNOSIS — T8450XA Infection and inflammatory reaction due to unspecified internal joint prosthesis, initial encounter: Secondary | ICD-10-CM

## 2011-10-17 MED ORDER — RIFAMPIN 300 MG PO CAPS: 300.0000 mg | ORAL_CAPSULE | Freq: Two times a day (BID) | ORAL | Status: DC

## 2011-10-17 MED ORDER — DOXYCYCLINE HYCLATE 100 MG PO TABS: 100.0000 mg | ORAL_TABLET | Freq: Two times a day (BID) | ORAL | Status: DC

## 2011-10-17 NOTE — Progress Notes (Signed)
  Subjective:    Patient ID: [name], female    DOB: [birth year], 78 y.o.   MRN: [id number]  HPI  78 y.o. female with prosthetic knee infection, sp aspiration in Dr Damita Dunnings office without growth of organism, sp single staged exchage arthroplasty on vancomyicn and rifampin to finish out 6 weeks of therapy. Knee pain is dramatically better now. She still has pain 6/10 more so with bearing weight. No fevers, nausea or malaise.      Review of Systems  Constitutional: Negative for fever, chills, diaphoresis, activity change, appetite change, fatigue and unexpected weight change.  HENT: Negative for congestion, sore throat, rhinorrhea, sneezing, trouble swallowing and sinus pressure.   Eyes: Negative for photophobia and visual disturbance.  Respiratory: Negative for cough, chest tightness, shortness of breath, wheezing and stridor.   Cardiovascular: Negative for chest pain, palpitations and leg swelling.  Gastrointestinal: Negative for nausea, vomiting, abdominal pain, diarrhea, constipation, blood in stool, abdominal distention and anal bleeding.  Genitourinary: Negative for dysuria, hematuria, flank pain and difficulty urinating.  Musculoskeletal: Negative for myalgias, back pain, joint swelling, arthralgias and gait problem.  Skin: Negative for color change, pallor, rash and wound.  Neurological: Negative for dizziness, tremors, weakness and light-headedness.  Hematological: Negative for adenopathy. Does not bruise/bleed easily.  Psychiatric/Behavioral: Negative for behavioral problems, confusion, sleep disturbance, dysphoric mood, decreased concentration and agitation.       Objective:   Physical Exam  Constitutional: She is oriented to person, place, and time. She appears well-developed and well-nourished. No distress.  HENT:  Head: Normocephalic and atraumatic.  Mouth/Throat: Oropharynx is clear and moist. No oropharyngeal exudate.  Eyes: Conjunctivae and EOM are normal. Pupils  are equal, round, and reactive to light. No scleral icterus.  Neck: Normal range of motion. Neck supple. No JVD present.  Cardiovascular: Normal rate, regular rhythm and normal heart sounds.  Exam reveals no gallop and no friction rub.   No murmur heard. Pulmonary/Chest: Effort normal and breath sounds normal. No respiratory distress. She has no wheezes. She has no rales. She exhibits no tenderness.  Abdominal: She exhibits no distension and no mass. There is no tenderness. There is no rebound and no guarding.  Musculoskeletal: She exhibits no edema and no tenderness.       Legs: Lymphadenopathy:    She has no cervical adenopathy.  Neurological: She is alert and oriented to person, place, and time. She has normal reflexes. She exhibits normal muscle tone. Coordination normal.  Skin: Skin is warm and dry. She is not diaphoretic. No erythema. No pallor.  Psychiatric: She has a normal mood and affect. Her behavior is normal. Judgment and thought content normal.          Assessment & Plan:  Infected prosthetic knee joint Check esr and crp today. Finish 6 weeks of therapy then change to doxy and rifampin for total of 6 months therapy potentially  HTN (hypertension) Needs to fu with PCP. Poorly controlled. No symptoms

## 2011-10-17 NOTE — Patient Instructions (Signed)
We will finish the vancomycin (IV) while continuing the rifampin  Then we will switch to   Doxycycline 100mg  twice daily AND Rifampin 300mg  twice daily

## 2011-10-17 NOTE — Assessment & Plan Note (Signed)
Check esr and crp today. Finish 6 weeks of therapy then change to doxy and rifampin for total of 6 months therapy potentially

## 2011-10-17 NOTE — Assessment & Plan Note (Signed)
Needs to fu with PCP. Poorly controlled. No symptoms

## 2011-10-18 ENCOUNTER — Encounter: Payer: Self-pay | Admitting: Infectious Disease

## 2011-10-18 LAB — SEDIMENTATION RATE: Sed Rate: 6 mm/hr (ref 0–22)

## 2011-10-24 ENCOUNTER — Other Ambulatory Visit: Payer: Self-pay | Admitting: *Deleted

## 2011-10-24 NOTE — Telephone Encounter (Signed)
[facility] called for an order to pull PICC at end of IV antibiotics on 10/27/11.  Per Dr. Tommy Medal order given. Myrtis Hopping CMA

## 2011-11-14 ENCOUNTER — Encounter: Payer: Self-pay | Admitting: Infectious Disease

## 2011-12-19 ENCOUNTER — Encounter: Payer: Medicare Other | Admitting: Infectious Disease

## 2012-01-21 ENCOUNTER — Other Ambulatory Visit: Payer: Self-pay | Admitting: Orthopedic Surgery

## 2012-01-21 DIAGNOSIS — M549 Dorsalgia, unspecified: Secondary | ICD-10-CM

## 2012-01-23 ENCOUNTER — Ambulatory Visit (INDEPENDENT_AMBULATORY_CARE_PROVIDER_SITE_OTHER): Payer: Medicare Other | Admitting: Infectious Disease

## 2012-01-23 ENCOUNTER — Encounter: Payer: Self-pay | Admitting: Infectious Disease

## 2012-01-23 ENCOUNTER — Ambulatory Visit
Admission: RE | Admit: 2012-01-23 | Discharge: 2012-01-23 | Disposition: A | Discharge status: Home / Self Care | Payer: Medicare Other | Source: Ambulatory Visit | Attending: Orthopedic Surgery | Admitting: Orthopedic Surgery

## 2012-01-23 VITALS — BP 147/88 | HR 69 | Temp 97.8°F | Wt 207.0 lb

## 2012-01-23 VITALS — BP 140/59 | HR 69

## 2012-01-23 DIAGNOSIS — T8450XA Infection and inflammatory reaction due to unspecified internal joint prosthesis, initial encounter: Secondary | ICD-10-CM

## 2012-01-23 DIAGNOSIS — I10 Essential (primary) hypertension: Secondary | ICD-10-CM

## 2012-01-23 DIAGNOSIS — Z96659 Presence of unspecified artificial knee joint: Secondary | ICD-10-CM

## 2012-01-23 DIAGNOSIS — M549 Dorsalgia, unspecified: Secondary | ICD-10-CM

## 2012-01-23 DIAGNOSIS — T8459XA Infection and inflammatory reaction due to other internal joint prosthesis, initial encounter: Secondary | ICD-10-CM

## 2012-01-23 LAB — CBC WITH DIFFERENTIAL/PLATELET
Basophils Absolute: 0 10*3/uL (ref 0.0–0.1)
Basophils Relative: 0 % (ref 0–1)
HCT: 43.2 % (ref 36.0–46.0)
MCHC: 32.2 g/dL (ref 30.0–36.0)
Monocytes Absolute: 0.4 10*3/uL (ref 0.1–1.0)
Neutro Abs: 3 10*3/uL (ref 1.7–7.7)
Neutrophils Relative %: 63 % (ref 43–77)
Platelets: 188 10*3/uL (ref 150–400)
RDW: 15.2 % (ref 11.5–15.5)

## 2012-01-23 LAB — COMPLETE METABOLIC PANEL WITH GFR
AST: 14 U/L (ref 0–37)
Albumin: 4.2 g/dL (ref 3.5–5.2)
Alkaline Phosphatase: 54 U/L (ref 39–117)
BUN: 37 mg/dL — ABNORMAL HIGH (ref 6–23)
Potassium: 5.5 mEq/L — ABNORMAL HIGH (ref 3.5–5.3)

## 2012-01-23 LAB — C-REACTIVE PROTEIN: CRP: 0.37 mg/dL (ref <0.60)

## 2012-01-23 MED ORDER — METHYLPREDNISOLONE ACETATE 40 MG/ML INJ SUSP (RADIOLOG
120.0000 mg | Freq: Once | INTRAMUSCULAR | Status: AC
  Administered 2012-01-23: 120 mg via EPIDURAL

## 2012-01-23 MED ORDER — IOHEXOL 180 MG/ML  SOLN
1.0000 mL | Freq: Once | INTRAMUSCULAR | Status: AC | PRN
  Administered 2012-01-23: 1 mL via EPIDURAL

## 2012-01-23 NOTE — Progress Notes (Signed)
  Subjective:    Patient ID: [name], female    DOB: [birth year], 78 y.o.   MRN: [id number]  HPI  78 y.o. female with prosthetic knee infection, sp aspiration in Dr Damita Dunnings office without growth of organism, sp single staged exchage arthroplasty on vancomyicn and rifampin to finish out 6 weeks of therapy, followed by doxycycline and rifampin. Her knee pain only is there with walking and can get to 6-7/10 but not there at rest except w3ith changesin the weather. No fever, chills, nausea or systemic symptoms.   Review of Systems  Constitutional: Negative for fever, chills, diaphoresis, activity change, appetite change, fatigue and unexpected weight change.  HENT: Negative for congestion, sore throat, rhinorrhea, sneezing, trouble swallowing and sinus pressure.   Eyes: Negative for photophobia and visual disturbance.  Respiratory: Negative for cough, chest tightness, shortness of breath, wheezing and stridor.   Cardiovascular: Negative for chest pain, palpitations and leg swelling.  Gastrointestinal: Negative for nausea, vomiting, abdominal pain, diarrhea, constipation, blood in stool, abdominal distention and anal bleeding.  Genitourinary: Negative for dysuria, hematuria, flank pain and difficulty urinating.  Musculoskeletal: Positive for arthralgias. Negative for myalgias, back pain, joint swelling and gait problem.  Skin: Negative for color change, pallor, rash and wound.  Neurological: Negative for dizziness, tremors, weakness and light-headedness.  Hematological: Negative for adenopathy. Does not bruise/bleed easily.  Psychiatric/Behavioral: Negative for behavioral problems, confusion, sleep disturbance, dysphoric mood, decreased concentration and agitation.       Objective:   Physical Exam  Constitutional: She is oriented to person, place, and time. She appears well-developed and well-nourished. No distress.  HENT:  Head: Normocephalic and atraumatic.  Mouth/Throat: Oropharynx  is clear and moist. No oropharyngeal exudate.  Eyes: Conjunctivae and EOM are normal. Pupils are equal, round, and reactive to light. No scleral icterus.  Neck: Normal range of motion. Neck supple. No JVD present.  Cardiovascular: Normal rate, regular rhythm and normal heart sounds.  Exam reveals no gallop and no friction rub.   No murmur heard. Pulmonary/Chest: Effort normal and breath sounds normal. No respiratory distress. She has no wheezes. She has no rales. She exhibits no tenderness.  Abdominal: She exhibits no distension and no mass. There is no tenderness. There is no rebound and no guarding.  Musculoskeletal: She exhibits no edema and no tenderness.       Legs: Lymphadenopathy:    She has no cervical adenopathy.  Neurological: She is alert and oriented to person, place, and time. She has normal reflexes. She exhibits normal muscle tone. Coordination normal.  Skin: Skin is warm and dry. She is not diaphoretic. No erythema. No pallor.  Psychiatric: She has a normal mood and affect. Her behavior is normal. Judgment and thought content normal.          Assessment & Plan:  Infected prosthetic knee joint Check esr, crp cmp, cbc. In January esr was 6. If numbers look good then finish out abx thru end of May and then stop them  HTN (hypertension) May need some titration of meds, defer to PCP

## 2012-01-23 NOTE — Assessment & Plan Note (Signed)
Check esr, crp cmp, cbc. In January esr was 6. If numbers look good then finish out abx thru end of May and then stop them

## 2012-01-23 NOTE — Assessment & Plan Note (Signed)
May need some titration of meds, defer to PCP

## 2012-01-23 NOTE — Patient Instructions (Signed)
Continue the doxycycline and rifampin thru the end of May and then stop (unless I call you and ask you to continue)

## 2012-01-23 NOTE — Discharge Instructions (Signed)

## 2012-01-24 LAB — SEDIMENTATION RATE: Sed Rate: 5 mm/hr (ref 0–22)

## 2012-02-14 ENCOUNTER — Other Ambulatory Visit: Payer: Self-pay | Admitting: Orthopedic Surgery

## 2012-02-14 DIAGNOSIS — M545 Low back pain, unspecified: Secondary | ICD-10-CM

## 2012-02-22 ENCOUNTER — Ambulatory Visit
Admission: RE | Admit: 2012-02-22 | Discharge: 2012-02-22 | Disposition: A | Discharge status: Home / Self Care | Payer: Medicare Other | Source: Ambulatory Visit | Attending: Orthopedic Surgery | Admitting: Orthopedic Surgery

## 2012-02-22 VITALS — BP 183/76 | HR 77

## 2012-02-22 DIAGNOSIS — M545 Low back pain, unspecified: Secondary | ICD-10-CM

## 2012-02-22 DIAGNOSIS — M5126 Other intervertebral disc displacement, lumbar region: Secondary | ICD-10-CM

## 2012-02-22 MED ORDER — IOHEXOL 180 MG/ML  SOLN
1.0000 mL | Freq: Once | INTRAMUSCULAR | Status: AC | PRN
  Administered 2012-02-22: 1 mL via EPIDURAL

## 2012-02-22 MED ORDER — METHYLPREDNISOLONE ACETATE 40 MG/ML INJ SUSP (RADIOLOG
120.0000 mg | Freq: Once | INTRAMUSCULAR | Status: AC
  Administered 2012-02-22: 120 mg via EPIDURAL

## 2012-03-26 ENCOUNTER — Telehealth: Payer: Self-pay | Admitting: Infectious Disease

## 2012-03-26 NOTE — Telephone Encounter (Signed)
Done, I have the records and she has an appointment on 04/15/2012 at 11:00am

## 2012-03-26 NOTE — Telephone Encounter (Signed)
Tamika can we get records of knee culture from Dr. Mayer Camel and have Ms Groesbeck seen this month by the clinic MD?

## 2012-04-02 NOTE — Telephone Encounter (Signed)
Perfect

## 2012-04-09 ENCOUNTER — Other Ambulatory Visit: Payer: Self-pay | Admitting: Orthopedic Surgery

## 2012-04-09 DIAGNOSIS — M545 Low back pain, unspecified: Secondary | ICD-10-CM

## 2012-04-15 ENCOUNTER — Encounter: Payer: Self-pay | Admitting: Internal Medicine

## 2012-04-15 ENCOUNTER — Ambulatory Visit (INDEPENDENT_AMBULATORY_CARE_PROVIDER_SITE_OTHER): Payer: Medicare Other | Admitting: Internal Medicine

## 2012-04-15 VITALS — BP 154/53 | HR 69 | Temp 97.7°F | Wt 214.0 lb

## 2012-04-15 DIAGNOSIS — T8450XA Infection and inflammatory reaction due to unspecified internal joint prosthesis, initial encounter: Secondary | ICD-10-CM

## 2012-04-15 LAB — SEDIMENTATION RATE: Sed Rate: 4 mm/hr (ref 0–22)

## 2012-04-15 NOTE — Progress Notes (Signed)
ID NOTE  RFV: follow up on prosthetic joint infection  Subjective:    Patient ID: [name], female    DOB: [birth year], 78 y.o.   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 At that time, her inflammatory markers normalized, she had been on abtx for roughly 5-30months. While off of antibiotics < 1 month, she had noticed that her right knee re accumulated fluid. She was seen by her orthopedics surgeon, Dr. Elmer Sow, on Jun 18th, where she underwent a repeat arthrocentesis, and restarted back on doxycycline, for "life".  She reports tolerating the antibiotic without difficulty. No rash. No pill esophagitis. No diarrhea. She states that her knee has intermittent swelling, worse at the end of the day. Warm to touch. No fever, chills, nightsweats  Current Outpatient Prescriptions on File Prior to Visit  Medication Sig Dispense Refill  . albuterol (PROVENTIL HFA;VENTOLIN HFA) 108 (90 BASE) MCG/ACT inhaler Inhale 2 puffs into the lungs every 6 (six) hours as needed. For shortness of breath       . ALPRAZolam (XANAX) 0.5 MG tablet Take 0.5 mg by mouth at bedtime as needed. For anxiety       . amLODipine (NORVASC) 10 MG tablet Take 10 mg by mouth daily.        Marland Kitchen atenolol (TENORMIN) 25 MG tablet Take 1 tablet (25 mg total) by mouth daily.  30 tablet  1  . atenolol (TENORMIN) 50 MG tablet Take 50 mg by mouth daily.        . CELEBREX 200 MG capsule       . cloNIDine (CATAPRES) 0.1 MG tablet Take 1 tablet (0.1 mg total) by mouth 2 (two) times daily.  60 tablet  0  . cyclobenzaprine (FLEXERIL) 5 MG tablet Take 5 mg by mouth 3 (three) times daily as needed. For pain       . doxycycline (VIBRA-TABS) 100 MG tablet Take 1 tablet (100 mg total) by mouth 2 (two) times daily.  60 tablet  11  . doxycycline (VIBRA-TABS) 100 MG tablet       .  esomeprazole (NEXIUM) 40 MG capsule Take 40 mg by mouth daily before breakfast.        . fluticasone (FLOVENT HFA) 110 MCG/ACT inhaler Inhale 1 puff into the lungs 2 (two) times daily.        . furosemide (LASIX) 80 MG tablet       . gabapentin (NEURONTIN) 300 MG capsule Take 300 mg by mouth 2 (two) times daily.        Marland Kitchen HYDROcodone-acetaminophen (NORCO) 10-325 MG per tablet Take 1 tablet by mouth every 6 (six) hours as needed. For pain       . phenazopyridine (PYRIDIUM) 100 MG tablet Take by mouth 2 (two) times daily. Unknown dose. Take one tablet twice a day. Prescribed in hospital.      . rifampin (RIFADIN) 300 MG capsule Take 1 capsule (300 mg total) by mouth every 12 (twelve) hours.  90 capsule  0  . rifampin (RIFADIN) 300 MG capsule Take 1 capsule (300 mg total) by mouth every 12 (twelve) hours.  60 capsule  11  . rifampin (RIFADIN) 300 MG capsule       . zolpidem (AMBIEN) 5 MG tablet        Active Ambulatory Problems  Diagnosis Date Noted  . Infected prosthetic knee joint 09/10/2011  . HTN (hypertension) 10/17/2011   Resolved Ambulatory Problems    Diagnosis Date Noted  . No Resolved Ambulatory Problems   Past Medical History  Diagnosis Date  . Diabetes mellitus    History  Substance Use Topics  . Smoking status: Never Smoker   . Smokeless tobacco: Never Used  . Alcohol Use: No  family history is not on file.    Review of Systems 10 point ROS reviewed, posititve pertinents listed in HPI    Objective:   Physical Exam  BP 154/53  Pulse 69  Temp 97.7 F (36.5 C) (Oral)  Wt 214 lb (97.07 kg) gen = a x o by 3, in NAD MSK= full range of motion of knees Skin = right knee has surgical scar. Slight warm to touch. No effusion. No tenderness. No erythema  LABS as reported by Dr. Oswald Hillock office: March 11, 2012: Hazy, yellow, cell count 425, with 8% N/51%L/41%M, no crystals Gram stain no organism: culture NGTD  Labs: Lab Results  Component Value Date   ESRSEDRATE 4  04/15/2012   Lab Results  Component Value Date   CRP <0.5 04/15/2012       Assessment & Plan:  Prosthetic joint infection = continue with doxycycline. Will check esr and crp. Come back to clinic in 4 months.

## 2012-04-16 ENCOUNTER — Inpatient Hospital Stay: Admission: RE | Admit: 2012-04-16 | Payer: Medicare Other | Source: Ambulatory Visit

## 2012-04-16 LAB — C-REACTIVE PROTEIN: CRP: <0.5 mg/dL (ref <0.60)

## 2012-04-21 ENCOUNTER — Ambulatory Visit
Admission: RE | Admit: 2012-04-21 | Discharge: 2012-04-21 | Disposition: A | Discharge status: Home / Self Care | Payer: Medicare Other | Source: Ambulatory Visit | Attending: Orthopedic Surgery | Admitting: Orthopedic Surgery

## 2012-04-21 VITALS — BP 178/86 | HR 76

## 2012-04-21 DIAGNOSIS — M5126 Other intervertebral disc displacement, lumbar region: Secondary | ICD-10-CM

## 2012-04-21 DIAGNOSIS — M545 Low back pain, unspecified: Secondary | ICD-10-CM

## 2012-04-21 MED ORDER — IOHEXOL 180 MG/ML  SOLN
1.0000 mL | Freq: Once | INTRAMUSCULAR | Status: AC | PRN
  Administered 2012-04-21: 1 mL via EPIDURAL

## 2012-04-21 MED ORDER — METHYLPREDNISOLONE ACETATE 40 MG/ML INJ SUSP (RADIOLOG
120.0000 mg | Freq: Once | INTRAMUSCULAR | Status: AC
  Administered 2012-04-21: 120 mg via EPIDURAL

## 2012-05-14 NOTE — Telephone Encounter (Signed)
documentation

## 2012-05-21 ENCOUNTER — Encounter: Payer: Self-pay | Admitting: Infectious Disease

## 2012-05-27 ENCOUNTER — Ambulatory Visit (INDEPENDENT_AMBULATORY_CARE_PROVIDER_SITE_OTHER): Payer: Medicare Other | Admitting: Infectious Disease

## 2012-05-27 ENCOUNTER — Encounter: Payer: Self-pay | Admitting: Infectious Disease

## 2012-05-27 ENCOUNTER — Ambulatory Visit: Payer: Medicare Other | Admitting: Infectious Disease

## 2012-05-27 VITALS — BP 137/70 | HR 77 | Temp 97.5°F | Ht 64.0 in | Wt 213.0 lb

## 2012-05-27 DIAGNOSIS — N19 Unspecified kidney failure: Secondary | ICD-10-CM | POA: Insufficient documentation

## 2012-05-27 DIAGNOSIS — T8459XA Infection and inflammatory reaction due to other internal joint prosthesis, initial encounter: Secondary | ICD-10-CM

## 2012-05-27 DIAGNOSIS — T8450XA Infection and inflammatory reaction due to unspecified internal joint prosthesis, initial encounter: Secondary | ICD-10-CM

## 2012-05-27 DIAGNOSIS — Z96659 Presence of unspecified artificial knee joint: Secondary | ICD-10-CM

## 2012-05-27 MED ORDER — DOXYCYCLINE HYCLATE 100 MG PO TABS: 100.0000 mg | ORAL_TABLET | Freq: Two times a day (BID) | ORAL | Status: DC

## 2012-05-27 NOTE — Assessment & Plan Note (Signed)
Check infammatory markers, cbc cmp continue doxycycline and rtc in 4 months

## 2012-05-27 NOTE — Progress Notes (Signed)
  Subjective:    Patient ID: [name], female    DOB: [birth year], 78 y.o.   MRN: [id number]  HPI  78 y.o. female with prosthetic knee infection, sp aspiration in Dr Damita Dunnings office without growth of organism, sp single staged exchage arthroplasty on vancomyicn and rifampin to finish out 6 weeks of therapy, followed by doxycycline and rifampin for nearly 6 months duration, taken off abx by myself when she was doing well with normal inflammatory markers. After comign off abx she had swellin of knee and it was aspirated with 400 cells, no growth on culture, placed back on doxy with improvement in her symptoms and seen by my partner Dr. Baxter Flattery in July, now returns to clinic for followup. She is doing very well withou much pain in knee. No fevers, chills, malaise.    Review of Systems  Constitutional: Negative for fever, chills, diaphoresis, activity change, appetite change, fatigue and unexpected weight change.  HENT: Negative for congestion, sore throat, rhinorrhea, sneezing, trouble swallowing and sinus pressure.   Eyes: Negative for photophobia and visual disturbance.  Respiratory: Negative for cough, chest tightness, shortness of breath, wheezing and stridor.   Cardiovascular: Negative for chest pain, palpitations and leg swelling.  Gastrointestinal: Negative for nausea, vomiting, abdominal pain, diarrhea, constipation, blood in stool, abdominal distention and anal bleeding.  Genitourinary: Negative for dysuria, hematuria, flank pain and difficulty urinating.  Musculoskeletal: Positive for arthralgias. Negative for myalgias, back pain, joint swelling and gait problem.  Skin: Negative for color change, pallor, rash and wound.  Neurological: Negative for dizziness, tremors, weakness and light-headedness.  Hematological: Negative for adenopathy. Does not bruise/bleed easily.  Psychiatric/Behavioral: Negative for behavioral problems, confusion, disturbed wake/sleep cycle, dysphoric mood,  decreased concentration and agitation.       Objective:   Physical Exam  Constitutional: She is oriented to person, place, and time. She appears well-developed and well-nourished. No distress.  HENT:  Head: Normocephalic and atraumatic.  Mouth/Throat: Oropharynx is clear and moist. No oropharyngeal exudate.  Eyes: Conjunctivae and EOM are normal. Pupils are equal, round, and reactive to light. No scleral icterus.  Neck: Normal range of motion. Neck supple. No JVD present.  Cardiovascular: Normal rate, regular rhythm and normal heart sounds.  Exam reveals no gallop and no friction rub.   No murmur heard. Pulmonary/Chest: Effort normal and breath sounds normal. No respiratory distress. She has no wheezes. She has no rales. She exhibits no tenderness.  Abdominal: She exhibits no distension and no mass. There is no tenderness. There is no rebound and no guarding.  Musculoskeletal: She exhibits no edema and no tenderness.       Legs: Lymphadenopathy:    She has no cervical adenopathy.  Neurological: She is alert and oriented to person, place, and time. She has normal reflexes. She exhibits normal muscle tone. Coordination normal.  Skin: Skin is warm and dry. She is not diaphoretic. No erythema. No pallor.  Psychiatric: She has a normal mood and affect. Her behavior is normal. Judgment and thought content normal.          Assessment & Plan:  Infected prosthetic knee joint Check infammatory markers, cbc cmp continue doxycycline and rtc in 4 months  Renal failure resolved

## 2012-05-27 NOTE — Assessment & Plan Note (Signed)
resolved 

## 2012-05-28 LAB — CBC WITH DIFFERENTIAL/PLATELET
Basophils Absolute: 0 10*3/uL (ref 0.0–0.1)
Eosinophils Absolute: 0.1 10*3/uL (ref 0.0–0.7)
Eosinophils Relative: 3 % (ref 0–5)
Lymphocytes Relative: 38 % (ref 12–46)
Lymphs Abs: 1.8 10*3/uL (ref 0.7–4.0)
MCV: 91.3 fL (ref 78.0–100.0)
Neutrophils Relative %: 53 % (ref 43–77)
Platelets: 213 10*3/uL (ref 150–400)
RBC: 4.23 MIL/uL (ref 3.87–5.11)
RDW: 13.9 % (ref 11.5–15.5)
WBC: 4.7 10*3/uL (ref 4.0–10.5)

## 2012-05-28 LAB — BASIC METABOLIC PANEL WITH GFR
Calcium: 9.5 mg/dL (ref 8.4–10.5)
Creat: 1.61 mg/dL — ABNORMAL HIGH (ref 0.50–1.10)
GFR, Est African American: 35 mL/min — ABNORMAL LOW
GFR, Est Non African American: 30 mL/min — ABNORMAL LOW
Sodium: 142 mEq/L (ref 135–145)

## 2012-05-28 LAB — SEDIMENTATION RATE: Sed Rate: 1 mm/hr (ref 0–22)

## 2012-05-28 LAB — C-REACTIVE PROTEIN: CRP: <0.5 mg/dL (ref <0.60)

## 2012-06-02 ENCOUNTER — Ambulatory Visit: Payer: Medicare Other | Admitting: Infectious Disease

## 2012-08-21 IMAGING — CR DG HIP (WITH OR WITHOUT PELVIS) 2-3V*L*
1 series · 1 of 1 positions shown · non-contrast
Comparison: 10/09/2010 study.

CLINICAL DATA: Postop left arthroplasty procedure.

LEFT HIP - COMPLETE 2+ VIEW

[view not recorded]
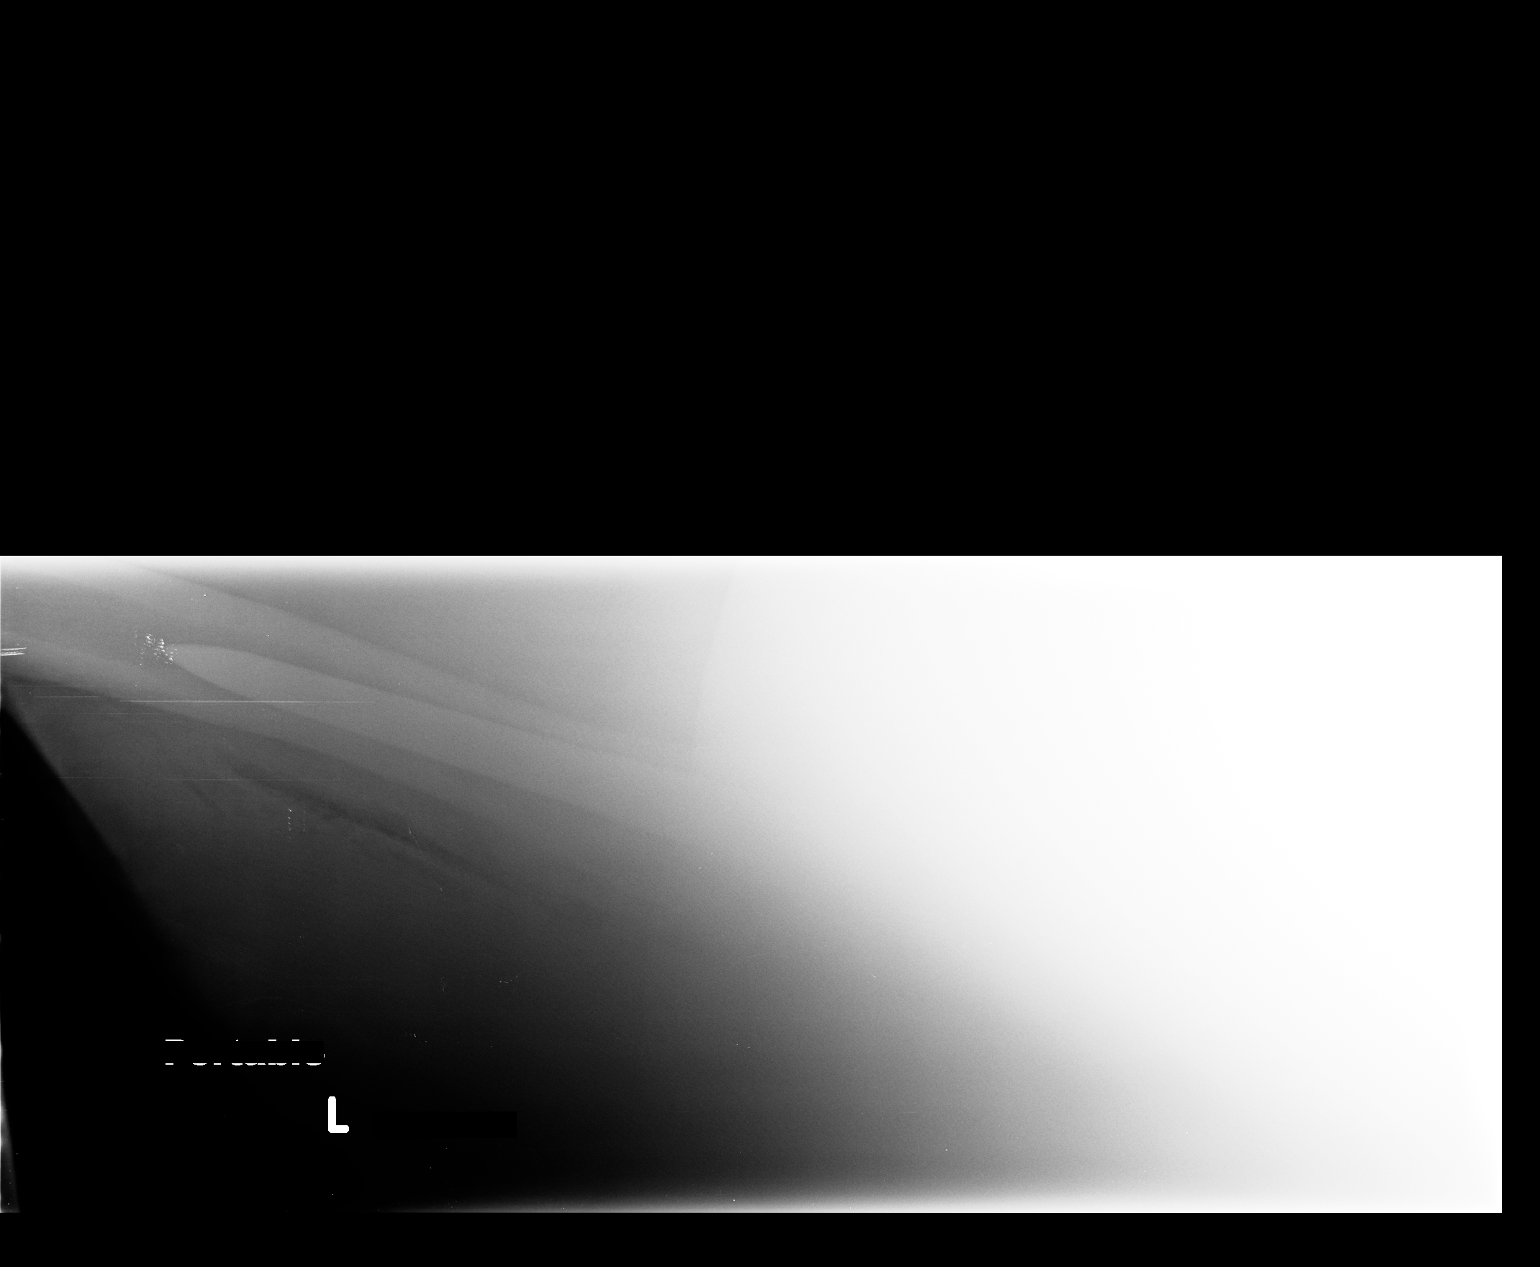

[1 of 1 positions shown; findings below may reference images not displayed]

FINDINGS: Portable cross-table lateral image of the left hip was
obtained.  There is no from the technologist that the patient has a
large and was unable to tolerate positioning for cross-table
lateral image obtained.  Penetration of the proximal soft tissues
cannot be obtained  in the area of the proximal left femoral
prosthesis.  The stem of the prosthesis is identified and has
expected appearance.
IMPRESSION: Examination compromised by patient's size.  Stem of the femoral
component of the left hip arthroplasty is demonstrated no evidence
of hardware disruption.

## 2012-08-21 IMAGING — CR DG PORTABLE PELVIS
2 series · 2 of 2 positions shown · non-contrast
Comparison: None.

CLINICAL DATA: Postoperative status.  Left hip arthroplasty
procedure.

PORTABLE PELVIS

[view not recorded (1 of 2)]
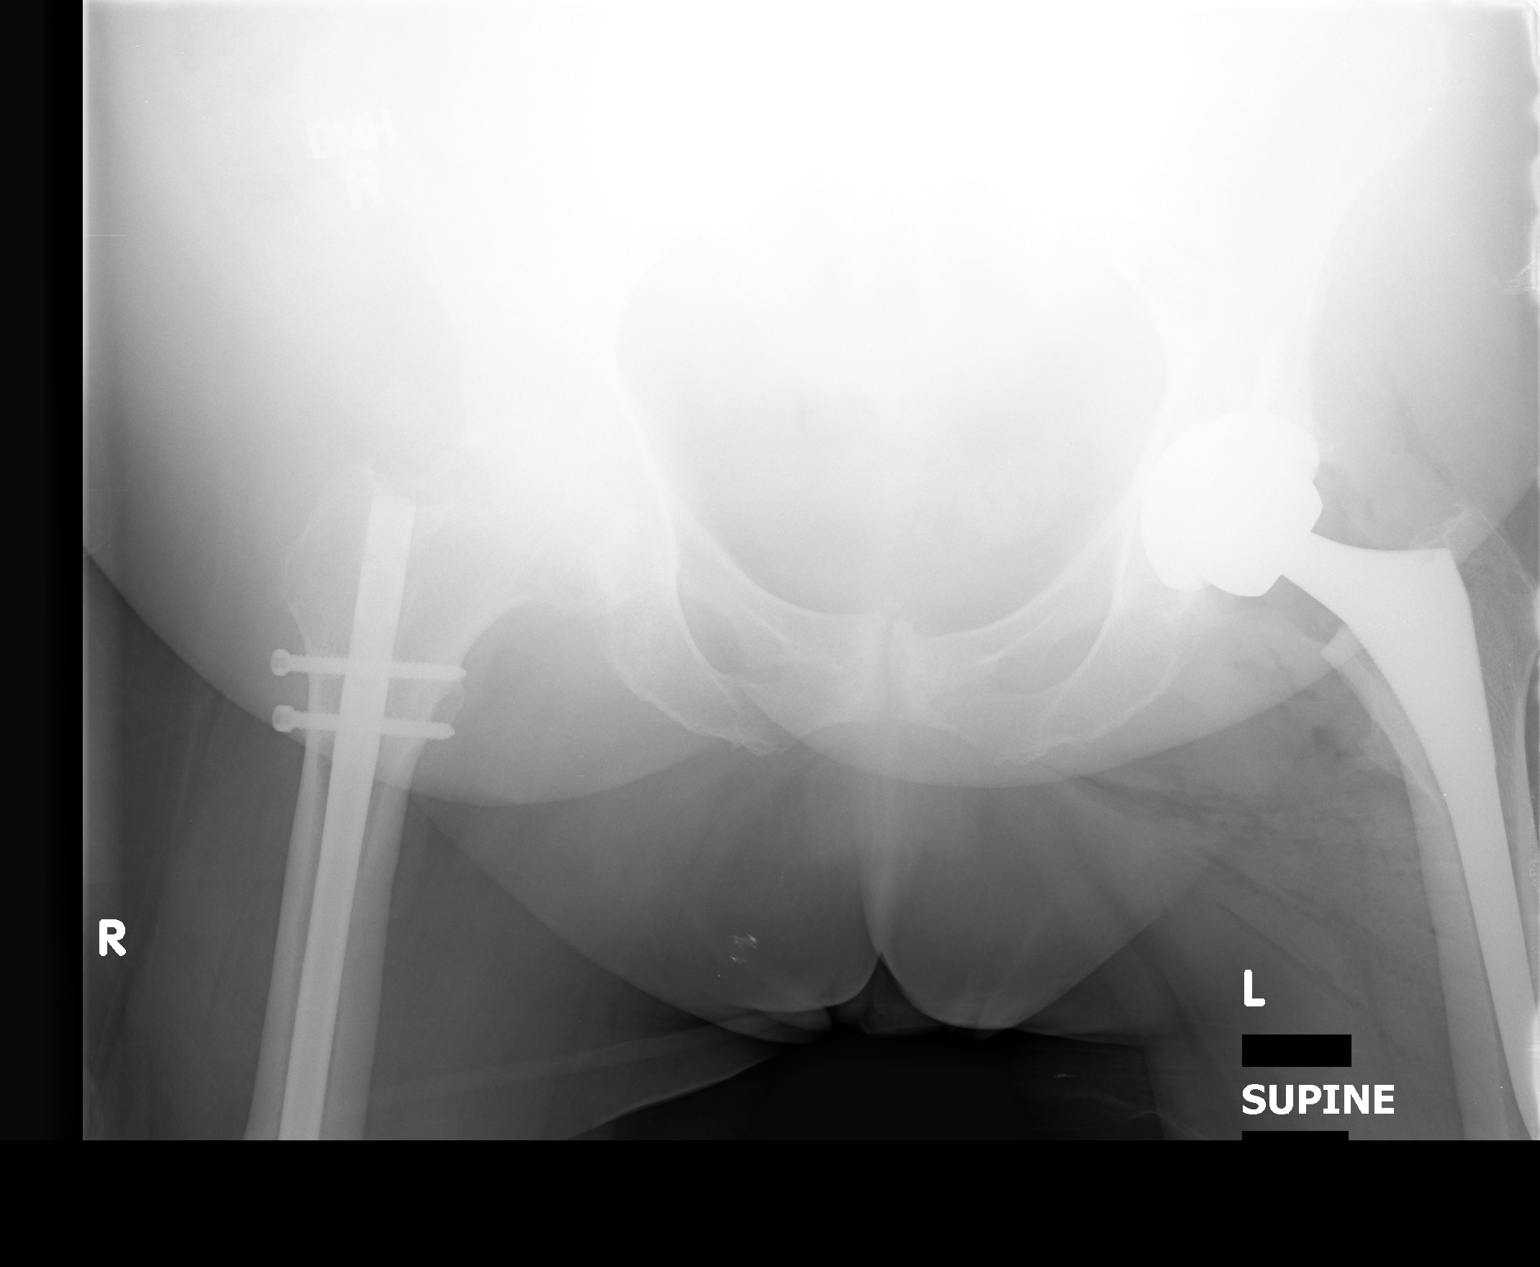

[view not recorded (2 of 2)]
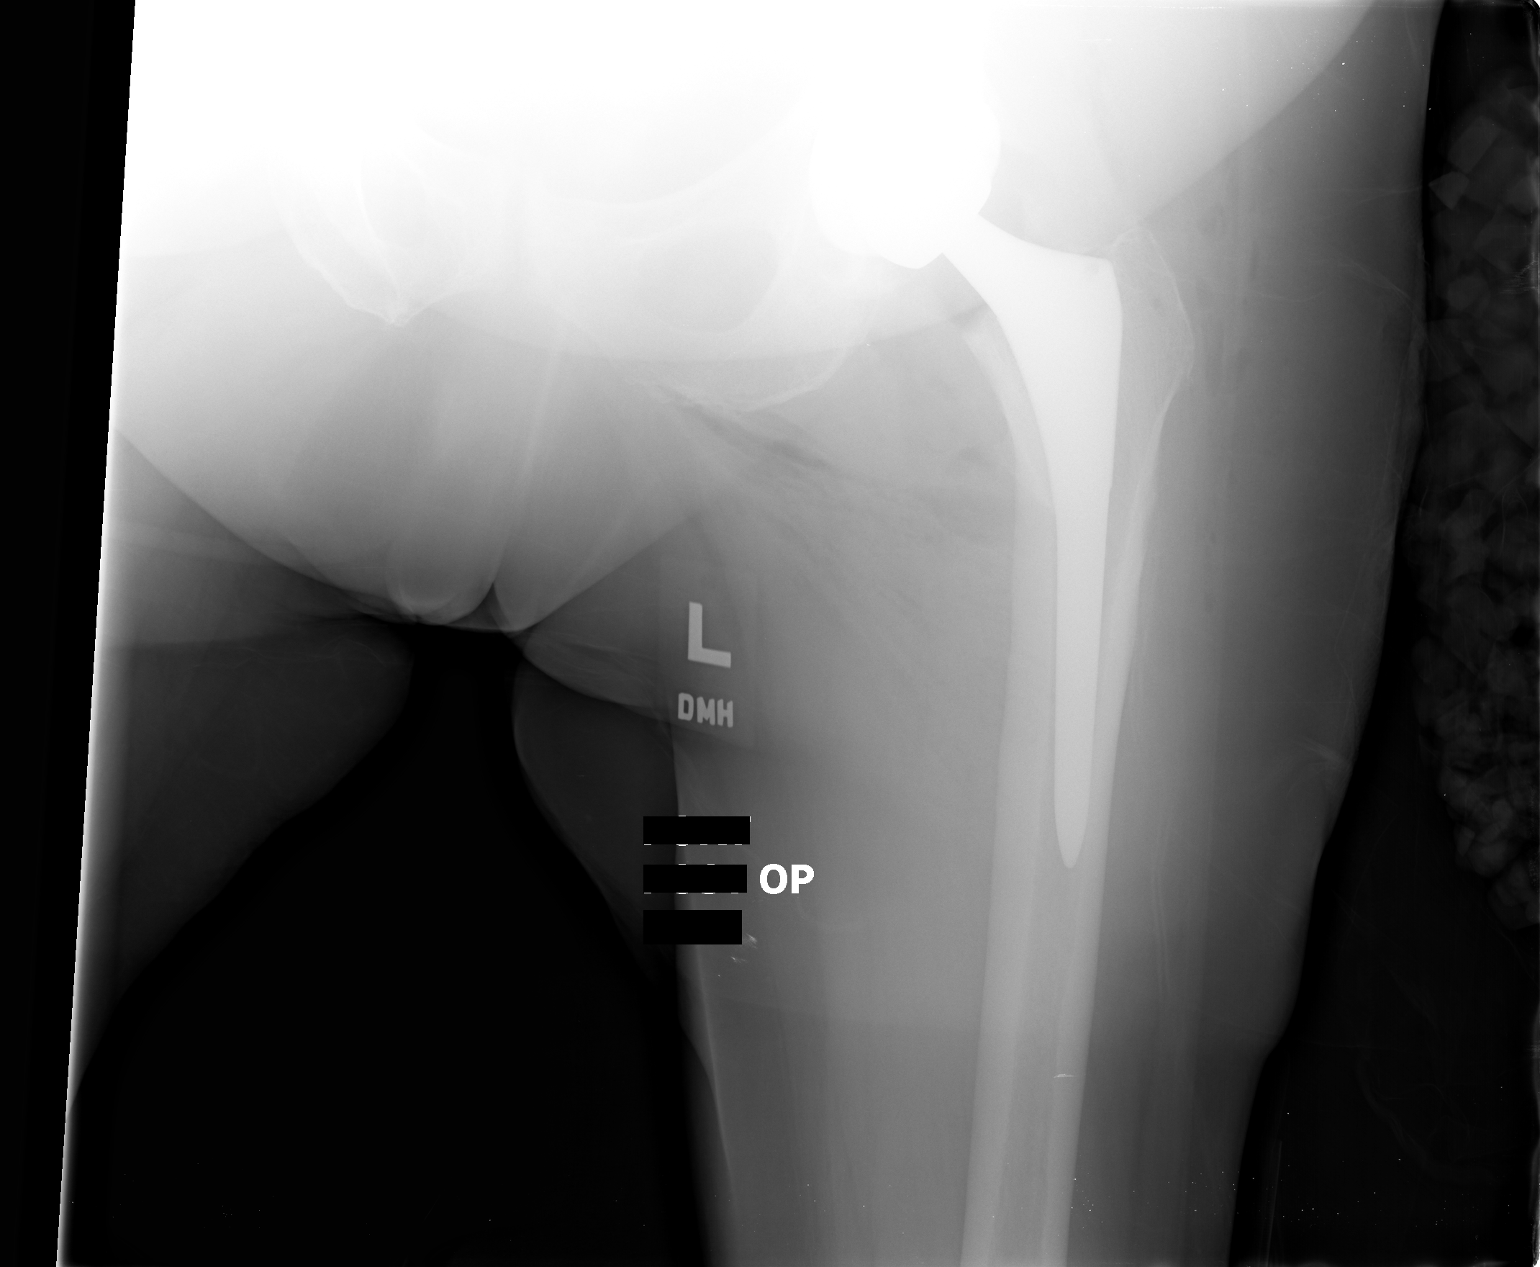

[2 of 2 positions shown; findings below may reference images not displayed]

FINDINGS: Left arthroplasty procedures been performed.  There is
expected relationship between the femoral and acetabular components
on the AP images.  No dislocation is seen.  The size of the patient
compromises detail.  Previous surgery on the right proximal femur
has been performed with intramedullary rod with two transverse
screws present.
IMPRESSION: Left hip arthroplasty procedure has been performed.  There is
expected relationship between the femoral and acetabular
components.  No dislocation is seen.

## 2012-09-29 ENCOUNTER — Ambulatory Visit (INDEPENDENT_AMBULATORY_CARE_PROVIDER_SITE_OTHER): Payer: Medicare Other | Admitting: Infectious Disease

## 2012-09-29 ENCOUNTER — Encounter: Payer: Self-pay | Admitting: Infectious Disease

## 2012-09-29 VITALS — BP 132/74 | HR 70 | Temp 97.4°F | Wt 210.0 lb

## 2012-09-29 DIAGNOSIS — Y849 Medical procedure, unspecified as the cause of abnormal reaction of the patient, or of later complication, without mention of misadventure at the time of the procedure: Secondary | ICD-10-CM

## 2012-09-29 DIAGNOSIS — Z96659 Presence of unspecified artificial knee joint: Secondary | ICD-10-CM

## 2012-09-29 DIAGNOSIS — T8450XA Infection and inflammatory reaction due to unspecified internal joint prosthesis, initial encounter: Secondary | ICD-10-CM

## 2012-09-29 DIAGNOSIS — T8459XA Infection and inflammatory reaction due to other internal joint prosthesis, initial encounter: Secondary | ICD-10-CM

## 2012-09-29 LAB — C-REACTIVE PROTEIN: CRP: <0.5 mg/dL (ref <0.60)

## 2012-09-29 MED ORDER — DOXYCYCLINE HYCLATE 100 MG PO TABS: 100.0000 mg | ORAL_TABLET | Freq: Two times a day (BID) | ORAL | Status: DC

## 2012-09-29 NOTE — Progress Notes (Signed)
  Subjective:    Patient ID: [name], female    DOB: Nov 16, 1931, 79 y.o.   MRN: [id number]  HPI  79 y.o. female with prosthetic knee infection, sp aspiration in Dr Damita Dunnings office without growth of organism, sp single staged exchage arthroplasty on vancomyicn and rifampin to finish out 6 weeks of therapy, followed by doxycycline and rifampin for nearly 6 months duration, taken off abx by myself when she was doing well with normal inflammatory markers. After comign off abx she had swellin of knee and it was aspirated with 400 cells, no growth on culture, placed back on doxy with improvement in her symptoms and seen by my partner Dr. Baxter Flattery in July, 2013 and by myself in September. Her ESR and CRP had normalized and knee pain was down to 5/10 with exertion and change in weather. She is with similar level of pain at the knee today and without fevers, chills or malaise.   Review of Systems  Constitutional: Negative for fever, chills, diaphoresis, activity change, appetite change, fatigue and unexpected weight change.  HENT: Negative for congestion, sore throat, rhinorrhea, sneezing, trouble swallowing and sinus pressure.   Eyes: Negative for photophobia and visual disturbance.  Respiratory: Negative for cough, chest tightness, shortness of breath, wheezing and stridor.   Cardiovascular: Negative for chest pain, palpitations and leg swelling.  Gastrointestinal: Negative for nausea, vomiting, abdominal pain, diarrhea, constipation, blood in stool, abdominal distention and anal bleeding.  Genitourinary: Negative for dysuria, hematuria, flank pain and difficulty urinating.  Musculoskeletal: Negative for myalgias, back pain, joint swelling, arthralgias and gait problem.  Skin: Negative for color change, pallor, rash and wound.  Neurological: Negative for dizziness, tremors, weakness and light-headedness.  Hematological: Negative for adenopathy. Does not bruise/bleed easily.  Psychiatric/Behavioral:  Negative for behavioral problems, confusion, sleep disturbance, dysphoric mood, decreased concentration and agitation.       Objective:   Physical Exam  Constitutional: She is oriented to person, place, and time. She appears well-developed and well-nourished. No distress.  HENT:  Head: Normocephalic and atraumatic.  Mouth/Throat: Oropharynx is clear and moist. No oropharyngeal exudate.  Eyes: Conjunctivae normal and EOM are normal. Pupils are equal, round, and reactive to light. No scleral icterus.  Neck: Normal range of motion. Neck supple. No JVD present.  Cardiovascular: Normal rate, regular rhythm and normal heart sounds.  Exam reveals no gallop and no friction rub.   No murmur heard. Pulmonary/Chest: Effort normal and breath sounds normal. No respiratory distress. She has no wheezes. She has no rales. She exhibits no tenderness.  Abdominal: She exhibits no distension and no mass. There is no tenderness. There is no rebound and no guarding.  Musculoskeletal: She exhibits no edema.       Legs: Lymphadenopathy:    She has no cervical adenopathy.  Neurological: She is alert and oriented to person, place, and time. She has normal reflexes. She exhibits normal muscle tone. Coordination normal.  Skin: Skin is warm and dry. She is not diaphoretic. No erythema. No pallor.  Psychiatric: She has a normal mood and affect. Her behavior is normal. Judgment and thought content normal.          Assessment & Plan:  Prosthetic knee infection: continue doxycline suppressive therapy, recheck esr and crp and rtc in 6 months

## 2013-01-29 ENCOUNTER — Other Ambulatory Visit (HOSPITAL_COMMUNITY): Payer: Self-pay | Admitting: Cardiovascular Disease

## 2013-02-13 IMAGING — NM NM BONE 3 PHASE
1 series · 6 of 6 positions shown · non-contrast
Comparison: Right knee radiographs 05/07/2010.

CLINICAL DATA: Right knee pain status post total knee arthroplasty
in 0778.  Question loosening.

NUCLEAR MEDICINE THREE PHASE BONE SCAN
TECHNIQUE: Radionuclide angiographic images, immediate static
blood pool images, and 3-hour delayed static images of the knees
were obtained after intravenous injection of radiopharmaceutical.
Radiopharmaceutical: 23.3 mCi technetium 99m MDP intravenously.

[Series 1: fl flow and static · 9.41mm/px · 6 of 39 frames shown]
[frame 4/39  full-range]
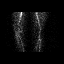
[frame 10/39  full-range]
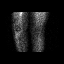
[frame 17/39  full-range]
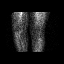
[frame 23/39  full-range]
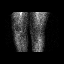
[frame 30/39  full-range]
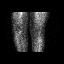
[frame 36/39  full-range]
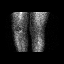

[6 of 6 positions shown; findings below may reference images not displayed]

FINDINGS: The dynamic images are normal with normal symmetric
perfusion to both knees.

On the blood pool and delayed phase images, there is mildly
increased activity surrounding the right total knee arthroplasty,
especially the tibial component.  This degree of activity is within
postoperative limits, although early loosening of the tibial
component cannot entirely be excluded.  There is no evidence of
loosening of the femoral component.  There is no increased activity
surrounding the distal aspect of the femoral intramedullary nail.
The activity on the left is normal.
IMPRESSION: 1.  Normal dynamic phase imaging.
2.  Mildly increased delayed phase activity surrounding the tibial
component of the right total knee arthroplasty.  Early loosening of
the tibial component cannot be excluded.

## 2013-03-09 ENCOUNTER — Ambulatory Visit (HOSPITAL_COMMUNITY): Payer: Medicare Other

## 2013-06-08 ENCOUNTER — Ambulatory Visit (INDEPENDENT_AMBULATORY_CARE_PROVIDER_SITE_OTHER): Payer: Medicare Other | Admitting: Infectious Disease

## 2013-06-08 ENCOUNTER — Encounter: Payer: Self-pay | Admitting: Infectious Disease

## 2013-06-08 VITALS — BP 135/82 | HR 61 | Temp 97.6°F | Wt 196.0 lb

## 2013-06-08 DIAGNOSIS — T889XXS Complication of surgical and medical care, unspecified, sequela: Secondary | ICD-10-CM

## 2013-06-08 DIAGNOSIS — Z23 Encounter for immunization: Secondary | ICD-10-CM

## 2013-06-08 DIAGNOSIS — T8450XA Infection and inflammatory reaction due to unspecified internal joint prosthesis, initial encounter: Secondary | ICD-10-CM | POA: Insufficient documentation

## 2013-06-08 DIAGNOSIS — T8459XS Infection and inflammatory reaction due to other internal joint prosthesis, sequela: Secondary | ICD-10-CM

## 2013-06-08 LAB — CBC WITH DIFFERENTIAL/PLATELET
Basophils Absolute: 0 10*3/uL (ref 0.0–0.1)
Basophils Relative: 0 % (ref 0–1)
Eosinophils Absolute: 0.3 10*3/uL (ref 0.0–0.7)
Eosinophils Relative: 5 % (ref 0–5)
HCT: 37.3 % (ref 36.0–46.0)
MCHC: 34 g/dL (ref 30.0–36.0)
MCV: 85 fL (ref 78.0–100.0)
Monocytes Absolute: 0.4 10*3/uL (ref 0.1–1.0)
Platelets: 224 10*3/uL (ref 150–400)
RDW: 14.3 % (ref 11.5–15.5)

## 2013-06-08 LAB — SEDIMENTATION RATE: Sed Rate: 1 mm/hr (ref 0–22)

## 2013-06-08 NOTE — Progress Notes (Signed)
Subjective:    Patient ID: TRENIDY DANNA, female    DOB: 1931/10/20, 79 y.o.   MRN: [id number]  HPI   79 y.o. female with prosthetic knee infection, sp aspiration in Dr Damita Dunnings office without growth of organism, sp single staged exchage arthroplasty on vancomyicn and rifampin to finish out 6 weeks of therapy, followed by doxycycline and rifampin for nearly 6 months duration, taken off abx by myself when she was doing well with normal inflammatory markers. After comign off abx she had swellin of knee and it was aspirated with 400 cells, no growth on culture, placed back on doxy with improvement in her symptoms and seen by my partner Dr. Baxter Flattery in July, 2013 and by myself in September. Her ESR and CRP had normalized and knee pain was down to 5/10 with exertion and change in weather. She is with similar level of pain at last several visits here and without fevers, chills or malaise.  On exam today she has hyperpigmented skin on the dorsum of both arms consistent with chronic doxycycline or tetracycline use.  Will recheck her inflammatory markers and metabolic panel today and CBC. I would consider changing her off of a tetracycline and place her on Bactrim and her inflammatory markers are still high. I would like to see if she can be trialed off of antibiotics if possible and will reconsider this at her next visit.   Review of Systems  Constitutional: Negative for fever, chills, diaphoresis, activity change, appetite change, fatigue and unexpected weight change.  HENT: Negative for congestion, sore throat, rhinorrhea, sneezing, trouble swallowing and sinus pressure.   Eyes: Negative for photophobia and visual disturbance.  Respiratory: Negative for cough, chest tightness, shortness of breath, wheezing and stridor.   Cardiovascular: Negative for chest pain, palpitations and leg swelling.  Gastrointestinal: Negative for nausea, vomiting, abdominal pain, diarrhea, constipation, blood in stool,  abdominal distention and anal bleeding.  Genitourinary: Negative for dysuria, hematuria, flank pain and difficulty urinating.  Musculoskeletal: Negative for myalgias, back pain, joint swelling, arthralgias and gait problem.  Skin: Negative for color change, pallor, rash and wound.  Neurological: Negative for dizziness, tremors, weakness and light-headedness.  Hematological: Negative for adenopathy. Does not bruise/bleed easily.  Psychiatric/Behavioral: Negative for behavioral problems, confusion, sleep disturbance, dysphoric mood, decreased concentration and agitation.       Objective:   Physical Exam  Constitutional: She is oriented to person, place, and time. She appears well-developed and well-nourished. No distress.  HENT:  Head: Normocephalic and atraumatic.  Mouth/Throat: Oropharynx is clear and moist. No oropharyngeal exudate.  Eyes: Conjunctivae and EOM are normal. Pupils are equal, round, and reactive to light. No scleral icterus.  Neck: Normal range of motion. Neck supple. No JVD present.  Cardiovascular: Normal rate, regular rhythm and normal heart sounds.  Exam reveals no gallop and no friction rub.   No murmur heard. Pulmonary/Chest: Effort normal and breath sounds normal. No respiratory distress. She has no wheezes. She has no rales. She exhibits no tenderness.  Abdominal: She exhibits no distension and no mass. There is no tenderness. There is no rebound and no guarding.  Musculoskeletal: She exhibits no edema.       Legs: Lymphadenopathy:    She has no cervical adenopathy.  Neurological: She is alert and oriented to person, place, and time. She has normal reflexes. She exhibits normal muscle tone. Coordination normal.  Skin: Skin is warm and dry. She is not diaphoretic. No erythema. No pallor.  Psychiatric: She has a normal  mood and affect. Her behavior is normal. Judgment and thought content normal.          Assessment & Plan:  Prosthetic knee infection: recheck  esr and crp and will likely change her to oral bactrim and rtc in 6 months at which point in time we would reconsider stopping abx vs continuing   Need for flu vaccine: pt prefers to receive at PCP

## 2013-06-09 LAB — BASIC METABOLIC PANEL WITH GFR
BUN: 26 mg/dL — ABNORMAL HIGH (ref 6–23)
CO2: 29 mEq/L (ref 19–32)
Calcium: 9.7 mg/dL (ref 8.4–10.5)
Chloride: 107 mEq/L (ref 96–112)
Creat: 1.23 mg/dL — ABNORMAL HIGH (ref 0.50–1.10)

## 2013-06-09 LAB — C-REACTIVE PROTEIN: CRP: <0.5 mg/dL (ref <0.60)

## 2013-06-10 ENCOUNTER — Telehealth: Payer: Self-pay | Admitting: Infectious Disease

## 2013-06-10 MED ORDER — SULFAMETHOXAZOLE-TMP DS 800-160 MG PO TABS: 1.0000 | ORAL_TABLET | Freq: Two times a day (BID) | ORAL | Status: DC

## 2013-06-10 MED ORDER — SULFAMETHOXAZOLE-TMP DS 800-160 MG PO TABS: 1.0000 | ORAL_TABLET | Freq: Every day | ORAL | Status: DC

## 2013-06-10 NOTE — Telephone Encounter (Signed)
I would like [name] to switch over to oral bactrim One DS tablet twice daily inplace of her doxycyline and continue this until next visit

## 2013-06-25 ENCOUNTER — Telehealth: Payer: Self-pay | Admitting: *Deleted

## 2013-06-25 NOTE — Telephone Encounter (Signed)
If they feel strongly they can stop the abx and we can watch. Per my notes inflammatory markers were still high

## 2013-06-25 NOTE — Telephone Encounter (Signed)
Patient's daughter, Suanne Marker called with concerns about new antibiotics, making patient "feel bad". Wants to know if should continue antibiotic or stop, per last phone note there was a question of whether she needed to continue. Please advise Myrtis Hopping

## 2013-06-26 NOTE — Telephone Encounter (Signed)
Spoke with patient and she said she thinks it was just getting use to the new antibiotic. She is going to stay on it and will let us know if her symptoms worsen. [name]

## 2013-06-26 NOTE — Telephone Encounter (Signed)
thanks

## 2013-06-29 ENCOUNTER — Telehealth: Payer: Self-pay | Admitting: *Deleted

## 2013-06-29 NOTE — Telephone Encounter (Signed)
Pt's daughter states that her mother is still having bad side effects from the Bactrim DS BID (abdominal pain, body aches, decreased strength - no mention of diarrhea).  Pt has stopped the antibiotic.  Daughter would like to know if the patient can go back to her previous antibiotic, or if there is another alternative.  Next appt is in January.  Please advise. Landis Gandy, RN

## 2013-06-29 NOTE — Telephone Encounter (Signed)
SHE Could but she is getting hyperpigmented skin from the doxy

## 2013-06-29 NOTE — Telephone Encounter (Signed)
excellent

## 2013-06-29 NOTE — Telephone Encounter (Signed)
Per daughter, pt will restart the doxy, even with the hyperpgimentation side effect.  Should they still follow up in January, or does she need an appointment sooner? Thanks!

## 2013-10-12 ENCOUNTER — Ambulatory Visit (INDEPENDENT_AMBULATORY_CARE_PROVIDER_SITE_OTHER): Payer: Medicare Other | Admitting: Infectious Disease

## 2013-10-12 ENCOUNTER — Encounter: Payer: Self-pay | Admitting: Infectious Disease

## 2013-10-12 VITALS — BP 161/74 | HR 61 | Temp 97.6°F | Wt 195.0 lb

## 2013-10-12 DIAGNOSIS — T8450XA Infection and inflammatory reaction due to unspecified internal joint prosthesis, initial encounter: Secondary | ICD-10-CM

## 2013-10-12 DIAGNOSIS — L270 Generalized skin eruption due to drugs and medicaments taken internally: Secondary | ICD-10-CM

## 2013-10-12 LAB — C-REACTIVE PROTEIN

## 2013-10-12 LAB — SEDIMENTATION RATE: SED RATE: 4 mm/h (ref 0–22)

## 2013-10-12 MED ORDER — CEPHALEXIN 500 MG PO CAPS: 500.0000 mg | ORAL_CAPSULE | Freq: Two times a day (BID) | ORAL | Status: DC

## 2013-10-12 NOTE — Progress Notes (Signed)
Subjective:    Patient ID: [name], female    DOB: 1932/08/22, 80 y.o.   MRN: [id number]  HPI   80 y.o. female with prosthetic knee infection, sp aspiration in Dr Damita Dunnings office without growth of organism, sp single staged exchage arthroplasty on vancomyicn and rifampin to finish out 6 weeks of therapy, followed by doxycycline and rifampin for nearly 6 months duration, taken off abx by myself when she was doing well with normal inflammatory markers. After comign off abx she had swellin of knee and it was aspirated with 400 cells, no growth on culture, placed back on doxy with improvement in her symptoms and seen by my partner Dr. Baxter Flattery in July, 2013 and by myself in September. Her ESR and CRP had normalized and knee pain was down to 5/10 with exertion and change in weather. She is with similar level of pain at last several visits here and without fevers, chills or malaise.  On exam today she has hyperpigmented skin on the dorsum of both arms consistent with chronic doxycycline or tetracycline use.  We did  recheck her inflammatory markers and metabolic panel today and CBC and these were encouraging.   I changed her off of a tetracycline and place her on Bactrim but she could not tolerate this due to N/V. She went back on doxycyline  She is now having MORE knee pain than before and pain in her hip. No fevers, chills, nausea or malaise.      Review of Systems  Constitutional: Negative for fever, chills, diaphoresis, activity change, appetite change, fatigue and unexpected weight change.  HENT: Negative for congestion, rhinorrhea, sinus pressure, sneezing, sore throat and trouble swallowing.   Eyes: Negative for photophobia and visual disturbance.  Respiratory: Negative for cough, chest tightness, shortness of breath, wheezing and stridor.   Cardiovascular: Negative for chest pain, palpitations and leg swelling.  Gastrointestinal: Negative for nausea, vomiting, abdominal pain,  diarrhea, constipation, blood in stool, abdominal distention and anal bleeding.  Genitourinary: Negative for dysuria, hematuria, flank pain and difficulty urinating.  Musculoskeletal: Positive for arthralgias. Negative for back pain, gait problem, joint swelling and myalgias.  Skin: Positive for color change. Negative for pallor, rash and wound.  Neurological: Negative for dizziness, tremors, weakness and light-headedness.  Hematological: Negative for adenopathy. Does not bruise/bleed easily.  Psychiatric/Behavioral: Negative for behavioral problems, confusion, sleep disturbance, dysphoric mood, decreased concentration and agitation.       Objective:   Physical Exam  Constitutional: She is oriented to person, place, and time. She appears well-developed and well-nourished. No distress.  HENT:  Head: Normocephalic and atraumatic.  Mouth/Throat: Oropharynx is clear and moist. No oropharyngeal exudate.  Eyes: Conjunctivae and EOM are normal. Pupils are equal, round, and reactive to light. No scleral icterus.  Neck: Normal range of motion. Neck supple. No JVD present.  Cardiovascular: Normal rate, regular rhythm and normal heart sounds.  Exam reveals no gallop and no friction rub.   No murmur heard. Pulmonary/Chest: Effort normal and breath sounds normal. No respiratory distress. She has no wheezes. She has no rales. She exhibits no tenderness.  Abdominal: She exhibits no distension and no mass. There is no tenderness. There is no rebound and no guarding.  Musculoskeletal: She exhibits no edema.       Legs: Lymphadenopathy:    She has no cervical adenopathy.  Neurological: She is alert and oriented to person, place, and time. She exhibits normal muscle tone. Coordination normal.  Skin: Skin is warm and dry.  She is not diaphoretic. No erythema. No pallor.     Psychiatric: She has a normal mood and affect. Her behavior is normal. Judgment and thought content normal.   Knee join incision  clean     Hyperpigmented skin from doxy:          Assessment & Plan:  Prosthetic knee infection: recheck esr and crp  Today and will give her trial of keflex (we never knew the organism) keflex $RemoveBeforeDE'500mg'[id number]$  bid, if pain worsens will go up to 1g bid and if still bad, back to doxy  Hyperpigmented rash on sun exposed arms due to doxy: see if keflex change helps for this

## 2013-11-08 ENCOUNTER — Other Ambulatory Visit: Payer: Self-pay | Admitting: Infectious Disease

## 2014-01-27 ENCOUNTER — Encounter: Payer: Self-pay | Admitting: Infectious Disease

## 2014-01-27 ENCOUNTER — Ambulatory Visit (INDEPENDENT_AMBULATORY_CARE_PROVIDER_SITE_OTHER): Payer: Medicare Other | Admitting: Infectious Disease

## 2014-01-27 VITALS — BP 169/71 | HR 64 | Temp 98.2°F | Wt 202.0 lb

## 2014-01-27 DIAGNOSIS — L270 Generalized skin eruption due to drugs and medicaments taken internally: Secondary | ICD-10-CM

## 2014-01-27 DIAGNOSIS — T8450XA Infection and inflammatory reaction due to unspecified internal joint prosthesis, initial encounter: Secondary | ICD-10-CM

## 2014-01-27 NOTE — Progress Notes (Signed)
Subjective:    Patient ID: [name], female    DOB: September 06, 1932, 80 y.o.   MRN: [id number]  HPI   80 y.o. female with prosthetic knee infection, sp aspiration in Dr Wadie Lessen office without growth of organism, sp single staged exchage arthroplasty on vancomyicn and rifampin to finish out 6 weeks of therapy, followed by doxycycline and rifampin for nearly 6 months duration, taken off abx by myself when she was doing well with normal inflammatory markers. After comign off abx she had swellin of knee and it was aspirated with 400 cells, no growth on culture, placed back on doxy with improvement in her symptoms and seen by my partner Dr. Drue Second in July, 2013 and by myself in September 2014 and then again this January.. Her ESR and CRP had normalized and knee pain was down to 5/10 with exertion and change in weather. She is with similar level of pain at last several visits here and without fevers, chills or malaise.  On last  exam today she has hyperpigmented skin on the dorsum of both arms consistent with chronic doxycycline or tetracycline use.  I opted to change her to oral keflex and she says her knee pain has not been that bad but she has pain in her leg distal to knee and she states that Dr Kyla Balzarine mentioned "loosening."  We did  recheck her inflammatory markers and metabolic panel today and CBC and these were encouraging. . No fevers, chills, nausea or malaise.   She returnes for visit and her hyperpigmentation has not changed much to my eye, (pictures to follow)     Review of Systems  Constitutional: Negative for fever, chills, diaphoresis, activity change, appetite change, fatigue and unexpected weight change.  HENT: Negative for congestion, rhinorrhea, sinus pressure, sneezing, sore throat and trouble swallowing.   Eyes: Negative for photophobia and visual disturbance.  Respiratory: Negative for cough, chest tightness, shortness of breath, wheezing and stridor.   Cardiovascular:  Negative for chest pain, palpitations and leg swelling.  Gastrointestinal: Negative for nausea, vomiting, abdominal pain, diarrhea, constipation, blood in stool, abdominal distention and anal bleeding.  Genitourinary: Negative for dysuria, hematuria, flank pain and difficulty urinating.  Musculoskeletal: Positive for arthralgias. Negative for back pain, gait problem, joint swelling and myalgias.  Skin: Positive for color change. Negative for pallor, rash and wound.  Neurological: Negative for dizziness, tremors, weakness and light-headedness.  Hematological: Negative for adenopathy. Does not bruise/bleed easily.  Psychiatric/Behavioral: Negative for behavioral problems, confusion, sleep disturbance, dysphoric mood, decreased concentration and agitation.       Objective:   Physical Exam  Constitutional: She is oriented to person, place, and time. She appears well-developed and well-nourished. No distress.  HENT:  Head: Normocephalic and atraumatic.  Mouth/Throat: Oropharynx is clear and moist. No oropharyngeal exudate.  Eyes: Conjunctivae and EOM are normal. Pupils are equal, round, and reactive to light. No scleral icterus.  Neck: Normal range of motion. Neck supple. No JVD present.  Cardiovascular: Normal rate, regular rhythm and normal heart sounds.  Exam reveals no gallop and no friction rub.   No murmur heard. Pulmonary/Chest: Effort normal and breath sounds normal. No respiratory distress. She has no wheezes. She has no rales. She exhibits no tenderness.  Abdominal: She exhibits no distension and no mass. There is no tenderness. There is no rebound and no guarding.  Musculoskeletal: She exhibits no edema.       Legs: Lymphadenopathy:    She has no cervical adenopathy.  Neurological: She is  alert and oriented to person, place, and time. She exhibits normal muscle tone. Coordination normal.  Skin: Skin is warm and dry. She is not diaphoretic. No erythema. No pallor.     Psychiatric:  She has a normal mood and affect. Her behavior is normal. Judgment and thought content normal.   Knee join incision clean pic last visit       Pic todays visit:    Forearms last visit:      Todays visit:           Assessment & Plan:  Prosthetic knee infection: recheck esr and crp  Today and continue  keflex $Remov'500mg'aQhsJc$  bid for now  Hyperpigmented rash on sun exposed arms due to doxy: see if keflex change helps for this, has not so far

## 2014-01-28 LAB — BASIC METABOLIC PANEL WITH GFR
BUN: 27 mg/dL — ABNORMAL HIGH (ref 6–23)
CALCIUM: 8.9 mg/dL (ref 8.4–10.5)
CO2: 23 mEq/L (ref 19–32)
CREATININE: 1.11 mg/dL — AB (ref 0.50–1.10)
Chloride: 106 mEq/L (ref 96–112)
GFR, EST AFRICAN AMERICAN: 54 mL/min — AB
GFR, Est Non African American: 47 mL/min — ABNORMAL LOW
Glucose, Bld: 114 mg/dL — ABNORMAL HIGH (ref 70–99)
Potassium: 4.7 mEq/L (ref 3.5–5.3)
SODIUM: 141 meq/L (ref 135–145)

## 2014-01-28 LAB — SEDIMENTATION RATE: SED RATE: 1 mm/h (ref 0–22)

## 2014-01-28 LAB — C-REACTIVE PROTEIN

## 2014-02-01 ENCOUNTER — Other Ambulatory Visit: Payer: Self-pay | Admitting: *Deleted

## 2014-02-01 DIAGNOSIS — T8450XA Infection and inflammatory reaction due to unspecified internal joint prosthesis, initial encounter: Secondary | ICD-10-CM

## 2014-02-01 MED ORDER — CEPHALEXIN 500 MG PO CAPS: 500.0000 mg | ORAL_CAPSULE | Freq: Two times a day (BID) | ORAL | Status: DC

## 2014-05-05 ENCOUNTER — Encounter: Payer: Self-pay | Admitting: Infectious Disease

## 2014-05-05 ENCOUNTER — Ambulatory Visit (INDEPENDENT_AMBULATORY_CARE_PROVIDER_SITE_OTHER): Payer: Medicare HMO | Admitting: Infectious Disease

## 2014-05-05 VITALS — BP 152/79 | HR 81 | Temp 97.9°F | Wt 197.0 lb

## 2014-05-05 DIAGNOSIS — R21 Rash and other nonspecific skin eruption: Secondary | ICD-10-CM

## 2014-05-05 DIAGNOSIS — T889XXS Complication of surgical and medical care, unspecified, sequela: Secondary | ICD-10-CM | POA: Diagnosis not present

## 2014-05-05 DIAGNOSIS — T8459XS Infection and inflammatory reaction due to other internal joint prosthesis, sequela: Secondary | ICD-10-CM

## 2014-05-05 DIAGNOSIS — Z96659 Presence of unspecified artificial knee joint: Principal | ICD-10-CM

## 2014-05-05 LAB — BASIC METABOLIC PANEL WITH GFR
BUN: 24 mg/dL — AB (ref 6–23)
CALCIUM: 9.5 mg/dL (ref 8.4–10.5)
CO2: 28 mEq/L (ref 19–32)
CREATININE: 1.16 mg/dL — AB (ref 0.50–1.10)
Chloride: 107 mEq/L (ref 96–112)
GFR, EST NON AFRICAN AMERICAN: 45 mL/min — AB
GFR, Est African American: 51 mL/min — ABNORMAL LOW
Glucose, Bld: 103 mg/dL — ABNORMAL HIGH (ref 70–99)
Potassium: 4.9 mEq/L (ref 3.5–5.3)
Sodium: 141 mEq/L (ref 135–145)

## 2014-05-05 LAB — SEDIMENTATION RATE: SED RATE: 5 mm/h (ref 0–22)

## 2014-05-05 LAB — C-REACTIVE PROTEIN

## 2014-05-05 NOTE — Progress Notes (Signed)
Subjective:    Patient ID: [name], female    DOB: 1932/04/14, 80 y.o.   MRN: [id number]  HPI   81 y.o. female with prosthetic knee infection, sp aspiration in Dr Damita Dunnings office without growth of organism, sp single staged exchage arthroplasty on vancomyicn and rifampin to finish out 6 weeks of therapy, followed by doxycycline and rifampin for nearly 6 months duration, taken off abx by myself when she was doing well with normal inflammatory markers. After comign off abx she had swellin of knee and it was aspirated with 400 cells, no growth on culture, placed back on doxy with improvement in her symptoms and seen by my partner Dr. Baxter Flattery in July, 2013 and by myself in September 2014 and then again this January.. Her ESR and CRP had normalized and knee pain was down to 5/10 with exertion and change in weather. She is with similar level of pain at last several visits here and without fevers, chills or malaise.   I opted to change her to oral keflex and she says her knee pain has not been that bad but she has pain in her leg distal to knee and she states that Dr Doneta Public mentioned "loosening."  We did  recheck her inflammatory markers and metabolic panel today and CBC and these were encouraging. . No fevers, chills, nausea or malaise.    I last saw her in May and she states that her pain is relatively same as it was blanches without fevers chills or malaise.     Review of Systems  Constitutional: Negative for fever, chills, diaphoresis, activity change, appetite change, fatigue and unexpected weight change.  HENT: Negative for congestion, rhinorrhea, sinus pressure, sneezing, sore throat and trouble swallowing.   Eyes: Negative for photophobia and visual disturbance.  Respiratory: Negative for cough, chest tightness, shortness of breath, wheezing and stridor.   Cardiovascular: Negative for chest pain, palpitations and leg swelling.  Gastrointestinal: Negative for nausea, vomiting, abdominal  pain, diarrhea, constipation, blood in stool, abdominal distention and anal bleeding.  Genitourinary: Negative for dysuria, hematuria, flank pain and difficulty urinating.  Musculoskeletal: Positive for arthralgias. Negative for back pain, gait problem, joint swelling and myalgias.  Skin: Positive for color change. Negative for pallor, rash and wound.  Neurological: Negative for dizziness, tremors, weakness and light-headedness.  Hematological: Negative for adenopathy. Does not bruise/bleed easily.  Psychiatric/Behavioral: Negative for behavioral problems, confusion, sleep disturbance, dysphoric mood, decreased concentration and agitation.       Objective:   Physical Exam  Constitutional: She is oriented to person, place, and time. She appears well-developed and well-nourished. No distress.  HENT:  Head: Normocephalic and atraumatic.  Mouth/Throat: Oropharynx is clear and moist. No oropharyngeal exudate.  Eyes: Conjunctivae and EOM are normal. Pupils are equal, round, and reactive to light. No scleral icterus.  Neck: Normal range of motion. Neck supple. No JVD present.  Cardiovascular: Normal rate, regular rhythm and normal heart sounds.  Exam reveals no gallop and no friction rub.   No murmur heard. Pulmonary/Chest: Effort normal and breath sounds normal. No respiratory distress. She has no wheezes. She has no rales. She exhibits no tenderness.  Abdominal: She exhibits no distension and no mass. There is no tenderness. There is no rebound and no guarding.  Musculoskeletal: She exhibits no edema.       Legs: Lymphadenopathy:    She has no cervical adenopathy.  Neurological: She is alert and oriented to person, place, and time. She exhibits normal muscle tone. Coordination  normal.  Skin: Skin is warm and dry. She is not diaphoretic. No erythema. No pallor.     Psychiatric: She has a normal mood and affect. Her behavior is normal. Judgment and thought content normal.   Knee join  incision clean pic last fwq visits 2 visits ago       Last  visit:          Assessment & Plan:  Prosthetic knee infection: recheck esr and crp  Today and continue  keflex $Remov'500mg'[id number]$  bid for now  Hyperpigmented rash on sun exposed arms : did not examine today

## 2015-01-18 ENCOUNTER — Ambulatory Visit (INDEPENDENT_AMBULATORY_CARE_PROVIDER_SITE_OTHER): Payer: Medicare Other | Admitting: Infectious Disease

## 2015-01-18 ENCOUNTER — Encounter: Payer: Self-pay | Admitting: Infectious Disease

## 2015-01-18 VITALS — BP 145/68 | HR 59 | Temp 98.0°F | Ht 64.0 in | Wt 193.0 lb

## 2015-01-18 DIAGNOSIS — I10 Essential (primary) hypertension: Secondary | ICD-10-CM | POA: Diagnosis not present

## 2015-01-18 DIAGNOSIS — T8450XD Infection and inflammatory reaction due to unspecified internal joint prosthesis, subsequent encounter: Secondary | ICD-10-CM | POA: Diagnosis not present

## 2015-01-18 DIAGNOSIS — L819 Disorder of pigmentation, unspecified: Secondary | ICD-10-CM | POA: Diagnosis not present

## 2015-01-18 DIAGNOSIS — Z96659 Presence of unspecified artificial knee joint: Principal | ICD-10-CM

## 2015-01-18 DIAGNOSIS — T8459XD Infection and inflammatory reaction due to other internal joint prosthesis, subsequent encounter: Secondary | ICD-10-CM

## 2015-01-18 NOTE — Patient Instructions (Signed)
We will stop the keflex   RTC in 3 months  If your pain worsens signfiicantly or you have unexplained fever or joint swells up then you need to call Dr. Tommy Medal and Dr. Mayer Camel

## 2015-01-18 NOTE — Progress Notes (Signed)
Subjective:    Patient ID: [name], female    DOB: September 07, 1932, 81 y.o.   MRN: [id number]  HPI   81 y.o. female with prosthetic knee infection, sp aspiration in Dr Damita Dunnings office without growth of organism, sp single staged exchage arthroplasty on vancomyicn and rifampin to finish out 6 weeks of therapy, followed by doxycycline and rifampin for nearly 6 months duration, taken off abx by myself when she was doing well with normal inflammatory markers. After comign off abx she had swellin of knee and it was aspirated with 400 cells, no growth on culture, placed back on doxy with improvement in her symptoms and seen by my partner Dr. Baxter Flattery in July, 2013 and by myself in September 2014 and then again this January.. Her ESR and CRP had normalized and knee pain was down to 5/10 with exertion and change in weather. She is with similar level of pain at last several visits here and without fevers, chills or malaise.   I opted to change her to oral keflex and she says her knee pain has not been that bad but she has pain in her leg distal to knee and she states that Dr Mayer Camel mentioned "loosening."  We did  recheck her inflammatory markers and metabolic panel today and CBC and these were encouraging. . No fevers, chills, nausea or malaise.    I last saw her in May 2015 and in August of 2015 and she states that her pain is relatively same as it was blanches without fevers chills or malaise.  She is here today for followup and feels symptoms are stable though she says her pain has not improved much over the past several years.     Review of Systems  Constitutional: Negative for fever, chills, diaphoresis, activity change, appetite change, fatigue and unexpected weight change.  HENT: Negative for congestion, rhinorrhea, sinus pressure, sneezing, sore throat and trouble swallowing.   Eyes: Negative for photophobia and visual disturbance.  Respiratory: Negative for cough, chest tightness, shortness  of breath, wheezing and stridor.   Cardiovascular: Negative for chest pain, palpitations and leg swelling.  Gastrointestinal: Negative for nausea, vomiting, abdominal pain, diarrhea, constipation, blood in stool, abdominal distention and anal bleeding.  Genitourinary: Negative for dysuria, hematuria, flank pain and difficulty urinating.  Musculoskeletal: Positive for arthralgias. Negative for myalgias, back pain, joint swelling and gait problem.  Skin: Positive for color change. Negative for pallor, rash and wound.  Neurological: Negative for dizziness, tremors, weakness and light-headedness.  Hematological: Negative for adenopathy. Does not bruise/bleed easily.  Psychiatric/Behavioral: Negative for behavioral problems, confusion, sleep disturbance, dysphoric mood, decreased concentration and agitation.       Objective:   Physical Exam  Constitutional: She is oriented to person, place, and time. She appears well-developed and well-nourished. No distress.  HENT:  Head: Normocephalic and atraumatic.  Mouth/Throat: Oropharynx is clear and moist. No oropharyngeal exudate.  Eyes: Conjunctivae and EOM are normal. Pupils are equal, round, and reactive to light. No scleral icterus.  Neck: Normal range of motion. Neck supple. No JVD present.  Cardiovascular: Normal rate, regular rhythm and normal heart sounds.  Exam reveals no gallop and no friction rub.   No murmur heard. Pulmonary/Chest: Effort normal and breath sounds normal. No respiratory distress. She has no wheezes. She has no rales. She exhibits no tenderness.  Abdominal: She exhibits no distension and no mass. There is no tenderness. There is no rebound and no guarding.  Musculoskeletal: She exhibits no edema.  Legs: Lymphadenopathy:    She has no cervical adenopathy.  Neurological: She is alert and oriented to person, place, and time. She exhibits normal muscle tone. Coordination normal.  Skin: Skin is warm and dry. She is not  diaphoretic. No erythema. No pallor.     Psychiatric: She has a normal mood and affect. Her behavior is normal. Judgment and thought content normal.   Knee join incision clean pic last fwq visits 3 visits ago       2 visits ago:          Assessment & Plan:  Prosthetic knee infection: will stop antibiotics today and followup in 3 months to see how she does off antibiotics   Hyperpigmented rash on sun exposed arms  Stable  HTN: being followed by PCP

## 2015-04-14 ENCOUNTER — Ambulatory Visit: Payer: Medicare Other | Admitting: Infectious Disease

## 2015-04-19 ENCOUNTER — Ambulatory Visit: Payer: Medicare Other | Admitting: Infectious Disease

## 2015-08-24 ENCOUNTER — Encounter: Payer: Self-pay | Admitting: Infectious Disease

## 2015-08-24 ENCOUNTER — Ambulatory Visit (INDEPENDENT_AMBULATORY_CARE_PROVIDER_SITE_OTHER): Payer: Medicare Other | Admitting: Infectious Disease

## 2015-08-24 VITALS — BP 134/75 | HR 60 | Temp 97.4°F | Wt 190.0 lb

## 2015-08-24 DIAGNOSIS — T8450XD Infection and inflammatory reaction due to unspecified internal joint prosthesis, subsequent encounter: Secondary | ICD-10-CM

## 2015-08-24 DIAGNOSIS — Z96659 Presence of unspecified artificial knee joint: Secondary | ICD-10-CM

## 2015-08-24 DIAGNOSIS — N19 Unspecified kidney failure: Secondary | ICD-10-CM

## 2015-08-24 DIAGNOSIS — T8459XD Infection and inflammatory reaction due to other internal joint prosthesis, subsequent encounter: Secondary | ICD-10-CM

## 2015-08-24 LAB — BASIC METABOLIC PANEL WITH GFR
BUN: 41 mg/dL — ABNORMAL HIGH (ref 7–25)
CO2: 27 mmol/L (ref 20–31)
Calcium: 9.1 mg/dL (ref 8.6–10.4)
Chloride: 101 mmol/L (ref 98–110)
Creat: 1.64 mg/dL — ABNORMAL HIGH (ref 0.60–0.88)
GFR, EST NON AFRICAN AMERICAN: 29 mL/min — AB (ref >=60)
GFR, Est African American: 34 mL/min — ABNORMAL LOW (ref >=60)
GLUCOSE: 100 mg/dL — AB (ref 65–99)
POTASSIUM: 5.2 mmol/L (ref 3.5–5.3)
Sodium: 139 mmol/L (ref 135–146)

## 2015-08-24 LAB — C-REACTIVE PROTEIN: CRP: <0.5 mg/dL (ref <0.60)

## 2015-08-24 NOTE — Progress Notes (Signed)
Chief complaint: followup for prosthetic joint infection Subjective:    Patient ID: [name], female    DOB: 12/18/1931, 81 y.o.   MRN: [id number]  HPI   81 y.o. female with prosthetic knee infection, sp aspiration in Dr Damita Dunnings office without growth of organism, sp single staged exchage arthroplasty on vancomyicn and rifampin to finish out 6 weeks of therapy, followed by doxycycline and rifampin for nearly 6 months duration, taken off abx by myself when she was doing well with normal inflammatory markers. After comign off abx she had swellin of knee and it was aspirated with 400 cells, no growth on culture, placed back on doxy with improvement in her symptoms and seen by my partner Dr. Baxter Flattery in July, 2013 and by myself in September 2014 and then again this January.. Her ESR and CRP had normalized and knee pain was down to 5/10 with exertion and change in weather. She is with similar level of pain at last several visits here and without fevers, chills or malaise.   I opted to change her to oral keflex and she says her knee pain has not been that bad but she has pain in her leg distal to knee and she states that Dr Mayer Camel mentioned "loosening."  We did  recheck her inflammatory markers and metabolic panel today and CBC and these were encouraging. . No fevers, chills, nausea or malaise.   WHen I had last saw her in May 2015 and in August of 2015 and she then stated that her pain is relatively same as it was blanches without fevers chills or malaise.  She is here today for followup a few months ago and stated that her pain was  stable though she says her pain has not improved much over the past several years.  When I saw her 3 months ago her inflammatory markers were encouraging and we took her off her oral abx and she has continued to do well off of antibiotics.  Past Medical History  Diagnosis Date  . Diabetes mellitus   . Prosthetic joint infection [facility])     Past Surgical History    Procedure Laterality Date  . Irrigation and debridement knee  09/12/2011    Procedure: IRRIGATION AND DEBRIDEMENT KNEE;  Surgeon: Kerin Salen;  Location: [facility];  Service: Orthopedics;  Laterality: Right;  I&D RIGHT TKA REVISE BEARINGS/MBT. Poly exchange    No family history on file.    Social History   Social History  . Marital Status: Widowed    Spouse Name: N/A  . Number of Children: N/A  . Years of Education: N/A   Social History Main Topics  . Smoking status: Never Smoker   . Smokeless tobacco: Never Used  . Alcohol Use: No  . Drug Use: No  . Sexual Activity: Not Asked   Other Topics Concern  . None   Social History Narrative    Allergies  Allergen Reactions  . Hydrochlorothiazide Other (See Comments)    unknown  . Lipitor [Atorvastatin Calcium] Other (See Comments)    unknown  . Thiazide-Type Diuretics Other (See Comments)    unknown  . Zocor [Simvastatin] Swelling     Current outpatient prescriptions:  .  albuterol (PROVENTIL HFA;VENTOLIN HFA) 108 (90 BASE) MCG/ACT inhaler, Inhale 2 puffs into the lungs every 6 (six) hours as needed. For shortness of breath , Disp: , Rfl:  .  ALPRAZolam (XANAX) 0.5 MG tablet, Take 0.5 mg by mouth at bedtime as needed. For anxiety ,  Disp: , Rfl:  .  amLODipine (NORVASC) 10 MG tablet, Take 10 mg by mouth daily.  , Disp: , Rfl:  .  atenolol (TENORMIN) 50 MG tablet, Take 50 mg by mouth daily.  , Disp: , Rfl:  .  CELEBREX 200 MG capsule, , Disp: , Rfl:  .  cloNIDine (CATAPRES) 0.1 MG tablet, Take 1 tablet (0.1 mg total) by mouth 2 (two) times daily., Disp: 60 tablet, Rfl: 0 .  esomeprazole (NEXIUM) 40 MG capsule, Take 40 mg by mouth daily before breakfast.  , Disp: , Rfl:  .  fluticasone (FLOVENT HFA) 110 MCG/ACT inhaler, Inhale 1 puff into the lungs 2 (two) times daily.  , Disp: , Rfl:  .  furosemide (LASIX) 80 MG tablet, , Disp: , Rfl:  .  gabapentin (NEURONTIN) 300 MG capsule, Take 300 mg by mouth 2 (two) times daily.  ,  Disp: , Rfl:  .  HYDROcodone-acetaminophen (NORCO) 10-325 MG per tablet, Take 1 tablet by mouth every 6 (six) hours as needed. For pain , Disp: , Rfl:  .  zolpidem (AMBIEN) 5 MG tablet, , Disp: , Rfl:  .  atenolol (TENORMIN) 25 MG tablet, Take 1 tablet (25 mg total) by mouth daily., Disp: 30 tablet, Rfl: 1 .  BESIVANCE 0.6 % SUSP, , Disp: , Rfl:  .  cyclobenzaprine (FLEXERIL) 5 MG tablet, Take 5 mg by mouth 3 (three) times daily as needed. For pain , Disp: , Rfl:  .  DUREZOL 0.05 % EMUL, , Disp: , Rfl:  .  PATADAY 0.2 % SOLN, , Disp: , Rfl:     Review of Systems  Constitutional: Negative for fever, chills, diaphoresis, activity change, appetite change, fatigue and unexpected weight change.  HENT: Negative for congestion, rhinorrhea, sinus pressure, sneezing, sore throat and trouble swallowing.   Eyes: Negative for photophobia and visual disturbance.  Respiratory: Negative for cough, chest tightness, shortness of breath, wheezing and stridor.   Cardiovascular: Negative for chest pain, palpitations and leg swelling.  Gastrointestinal: Negative for nausea, vomiting, abdominal pain, diarrhea, constipation, blood in stool, abdominal distention and anal bleeding.  Genitourinary: Negative for dysuria, hematuria, flank pain and difficulty urinating.  Musculoskeletal: Positive for arthralgias. Negative for myalgias, back pain, joint swelling and gait problem.  Skin: Positive for color change. Negative for pallor, rash and wound.  Neurological: Negative for dizziness, tremors, weakness and light-headedness.  Hematological: Negative for adenopathy. Does not bruise/bleed easily.  Psychiatric/Behavioral: Negative for behavioral problems, confusion, sleep disturbance, dysphoric mood, decreased concentration and agitation.      Objective:   Physical Exam  Constitutional: She is oriented to person, place, and time. She appears well-developed and well-nourished. No distress.  HENT:  Head: Normocephalic  and atraumatic.  Mouth/Throat: Oropharynx is clear and moist. No oropharyngeal exudate.  Eyes: Conjunctivae and EOM are normal. Pupils are equal, round, and reactive to light. No scleral icterus.  Neck: Normal range of motion. Neck supple. No JVD present.  Cardiovascular: Normal rate, regular rhythm and normal heart sounds.  Exam reveals no gallop and no friction rub.   No murmur heard. Pulmonary/Chest: Effort normal and breath sounds normal. No respiratory distress. She has no wheezes. She has no rales. She exhibits no tenderness.  Abdominal: She exhibits no distension and no mass. There is no tenderness. There is no rebound and no guarding.  Musculoskeletal: She exhibits no edema.       Legs: Lymphadenopathy:    She has no cervical adenopathy.  Neurological: She is alert and  oriented to person, place, and time. She exhibits normal muscle tone. Coordination normal.  Skin: Skin is warm and dry. She is not diaphoretic. No erythema. No pallor.     Psychiatric: She has a normal mood and affect. Her behavior is normal. Judgment and thought content normal.   Knee join incision clean pic several visits visits ago       3 visits ago:          Assessment & Plan:  Prosthetic knee infection:I had already stopped her abx and followup in 3 months today.  We will check ESR, CRP and BMP. Provided they remain encouraging and RTC PRN   Hyperpigmented rash on sun exposed arms  Stable  HTN: being followed by PCP

## 2015-08-25 LAB — SEDIMENTATION RATE: Sed Rate: 4 mm/hr (ref 0–30)

## 2015-08-25 NOTE — Progress Notes (Signed)
Very good 

## 2016-07-02 ENCOUNTER — Emergency Department (HOSPITAL_BASED_OUTPATIENT_CLINIC_OR_DEPARTMENT_OTHER): Payer: Medicare Other

## 2016-07-02 ENCOUNTER — Encounter (HOSPITAL_BASED_OUTPATIENT_CLINIC_OR_DEPARTMENT_OTHER): Payer: Self-pay | Admitting: *Deleted

## 2016-07-02 ENCOUNTER — Emergency Department (HOSPITAL_BASED_OUTPATIENT_CLINIC_OR_DEPARTMENT_OTHER)
Admission: EM | Admit: 2016-07-02 | Discharge: 2016-07-02 | Disposition: A | Discharge status: Home / Self Care | Payer: Medicare Other | Attending: Emergency Medicine | Admitting: Emergency Medicine

## 2016-07-02 DIAGNOSIS — I10 Essential (primary) hypertension: Secondary | ICD-10-CM | POA: Insufficient documentation

## 2016-07-02 DIAGNOSIS — Z79899 Other long term (current) drug therapy: Secondary | ICD-10-CM | POA: Insufficient documentation

## 2016-07-02 DIAGNOSIS — M79604 Pain in right leg: Secondary | ICD-10-CM | POA: Diagnosis present

## 2016-07-02 DIAGNOSIS — Z96651 Presence of right artificial knee joint: Secondary | ICD-10-CM | POA: Diagnosis not present

## 2016-07-02 DIAGNOSIS — E119 Type 2 diabetes mellitus without complications: Secondary | ICD-10-CM | POA: Insufficient documentation

## 2016-07-02 MED ORDER — OXYCODONE-ACETAMINOPHEN 5-325 MG PO TABS: 1.0000 | ORAL_TABLET | Freq: Four times a day (QID) | ORAL | 0 refills | Status: DC | PRN

## 2016-07-02 MED ORDER — PREDNISONE 10 MG PO TABS: 20.0000 mg | ORAL_TABLET | Freq: Two times a day (BID) | ORAL | 0 refills | Status: DC

## 2016-07-02 MED ORDER — HYDROMORPHONE HCL 1 MG/ML IJ SOLN
2.0000 mg | Freq: Once | INTRAMUSCULAR | Status: AC
  Administered 2016-07-02: 2 mg via INTRAMUSCULAR
  Filled 2016-07-02: qty 2

## 2016-07-02 NOTE — ED Provider Notes (Signed)
[facility] Provider Note   CSN: [id number] Arrival date & time: 07/02/16  1927   By signing my name below, I, [name], attest that this documentation has been prepared under the direction and in the presence of Kristine Speak, MD . Electronically Signed: Neta [name], ED Scribe. 07/02/2016. 9:24 PM.   History   Chief Complaint Chief Complaint  Patient presents with  . Leg Pain    The history is provided by the patient. No language interpreter was used.  Leg Pain   This is a new problem. Episode onset: 3 days ago. The problem occurs constantly. The problem has not changed since onset.The pain is present in the right hip, right upper leg, right knee and right lower leg. The quality of the pain is described as constant. The pain is severe. Associated symptoms include limited range of motion. Pertinent negatives include no numbness. The symptoms are aggravated by standing and activity. Treatments tried: Vicodin. The treatment provided no relief.   HPI Comments:  [name] is a 82 y.o. female who presents to the Emergency Department complaining of constant right hip pain x3 days. Per a friend, pt got up out of a chair 3 days ago and the pain began suddenly and has had trouble standing since the onset. Pt is not ambulatory. Pt states that the pain radiates across her entire right hip and down her right leg. Pt reports associated warmth to the affected area. Pt stats that the pain is exacerbated when moving her left leg in any way. Pt has extensive history or orthopaedic surgery to her right knee. Pt has taken Vicodin with no relief. Pt denies numbness.    Past Medical History:  Diagnosis Date  . Diabetes mellitus   . Prosthetic joint infection [facility])     Patient Active Problem List   Diagnosis Date Noted  . Prosthetic joint infection ([facility])   . Renal failure 05/27/2012  . HTN (hypertension) 10/17/2011  . Infected prosthetic knee joint ([place]) 09/10/2011    Class: Acute    Past Surgical History:  Procedure Laterality Date  . CHOLECYSTECTOMY    . IRRIGATION AND DEBRIDEMENT KNEE  09/12/2011   Procedure: IRRIGATION AND DEBRIDEMENT KNEE;  Surgeon: Kerin Salen;  Location: [facility];  Service: Orthopedics;  Laterality: Right;  I&D RIGHT TKA REVISE BEARINGS/MBT. Poly exchange  . REPLACEMENT TOTAL KNEE    . TOTAL HIP ARTHROPLASTY      OB History    No data available       Home Medications    Prior to Admission medications   Medication Sig Start Date End Date Taking? Authorizing Provider  albuterol (PROVENTIL HFA;VENTOLIN HFA) 108 (90 BASE) MCG/ACT inhaler Inhale 2 puffs into the lungs every 6 (six) hours as needed. For shortness of breath    Yes Historical Provider, MD  ALPRAZolam Duanne Moron) 0.5 MG tablet Take 0.5 mg by mouth at bedtime as needed. For anxiety    Yes Historical Provider, MD  amLODipine (NORVASC) 10 MG tablet Take 10 mg by mouth daily.     Yes Historical Provider, MD  atenolol (TENORMIN) 25 MG tablet Take 1 tablet (25 mg total) by mouth daily. Patient taking differently: Take by mouth daily.  09/16/11 07/02/16 Yes Michael Carnaghi, PA-C  atenolol (TENORMIN) 50 MG tablet Take 50 mg by mouth daily.     Yes Historical Provider, MD  CELEBREX 200 MG capsule  01/03/12  Yes Historical Provider, MD  cloNIDine (CATAPRES) 0.1 MG tablet Take  1 tablet (0.1 mg total) by mouth 2 (two) times daily. Patient taking differently: Take 0.2 mg by mouth 2 (two) times daily.  09/16/11  Yes Michael Carnaghi, PA-C  fluticasone (FLOVENT HFA) 110 MCG/ACT inhaler Inhale 1 puff into the lungs 2 (two) times daily.     Yes Historical Provider, MD  furosemide (LASIX) 80 MG tablet  01/18/12  Yes Historical Provider, MD  gabapentin (NEURONTIN) 300 MG capsule Take 300 mg by mouth 2 (two) times daily.     Yes Historical Provider, MD  HYDROcodone-acetaminophen (NORCO) 10-325 MG per tablet Take 1 tablet by mouth every 6 (six) hours as needed. For pain    Yes Historical  Provider, MD  zolpidem (AMBIEN) 5 MG tablet  01/20/12  Yes Historical Provider, MD  BESIVANCE 0.6 % SUSP  09/04/12   Historical Provider, MD  cyclobenzaprine (FLEXERIL) 5 MG tablet Take 5 mg by mouth 3 (three) times daily as needed. For pain     Historical Provider, MD  DUREZOL 0.05 % EMUL  09/04/12   Historical Provider, MD  esomeprazole (NEXIUM) 40 MG capsule Take 40 mg by mouth daily before breakfast.      Historical Provider, MD  PATADAY 0.2 % SOLN  07/02/12   Historical Provider, MD    Family History No family history on file.  Social History Social History  Substance Use Topics  . Smoking status: Never Smoker  . Smokeless tobacco: Never Used  . Alcohol use No     Allergies   Hydrochlorothiazide; Lipitor [atorvastatin calcium]; Thiazide-type diuretics; and Zocor [simvastatin]   Review of Systems Review of Systems  Musculoskeletal: Positive for arthralgias, gait problem and myalgias.  Neurological: Negative for numbness.  All other systems reviewed and are negative.    Physical Exam Updated Vital Signs BP 138/55   Pulse 63   Temp 97.7 F (36.5 C) (Oral)   Resp 20   Ht 5\' 4"  (1.626 m)   Wt 183 lb (83 kg)   SpO2 98%   BMI 31.41 kg/m   Physical Exam  Constitutional: She appears well-developed and well-nourished. No distress.  HENT:  Head: Normocephalic and atraumatic.  Eyes: Conjunctivae are normal.  Neck: Neck supple.  Cardiovascular: Normal rate and regular rhythm.   No murmur heard. Pulmonary/Chest: Effort normal and breath sounds normal. No respiratory distress.  Abdominal: Soft. She exhibits no distension. There is no tenderness.  Musculoskeletal: She exhibits no edema.  Right hip appears grossly normal. Good ROM, with some discomfort. Prior surgical scar noted to the right knee. No redness, warmth. DP pulses easily palpable. Motor and sensory intact for entire foot.   Neurological: She is alert.  Skin: Skin is warm and dry.  Psychiatric: She has a  normal mood and affect.  Nursing note and vitals reviewed.    ED Treatments / Results  DIAGNOSTIC STUDIES:  Oxygen Saturation is 98% on RA, normal by my interpretation.    COORDINATION OF CARE:  9:24 PM Discussed treatment plan with pt at bedside and pt agreed to plan.   Labs (all labs ordered are listed, but only abnormal results are displayed) Labs Reviewed - No data to display  EKG  EKG Interpretation None       Radiology Dg Femur Min 2 Views Right  Result Date: 07/02/2016 CLINICAL DATA:  Right upper leg pain which began on standing up 3 days ago. No known injury. Remote history of right femur fracture. Initial encounter. EXAM: RIGHT FEMUR 2 VIEWS COMPARISON:  None. FINDINGS: No acute  bony or joint abnormality is identified. The patient is status post fixation of a healed diaphyseal fracture of the right femur with fixation hardware in place. Right total knee arthroplasty is also identified. Moderate to moderately severe right hip osteoarthritis is seen. No focal bony lesion. IMPRESSION: No acute abnormality. Moderate to moderately severe right hip osteoarthritis. Status post right knee replacement and fixation of a healed femur fracture. Electronically Signed   By: Inge Rise M.D.   On: 07/02/2016 20:42    Procedures Procedures (including critical care time)  Medications Ordered in ED Medications - No data to display   Initial Impression / Assessment and Plan / ED Course  I have reviewed the triage vital signs and the nursing notes.  Pertinent labs & imaging results that were available during my care of the patient were reviewed by me and considered in my medical decision making (see chart for details).  Clinical Course    X-rays reveal no bony abnormality or hardware complication. Her history is more consistent with sciatica than it is a septic arthritis as the daughter is concerned about. There is no fever or warmth. She will be treated with prednisone, pain  medication, and is to follow-up with her surgeon if she worsens or does not improve.  Final Clinical Impressions(s) / ED Diagnoses   Final diagnoses:  None    New Prescriptions New Prescriptions   No medications on file  I personally performed the services described in this documentation, which was scribed in my presence. The recorded information has been reviewed and is accurate.        Kristine Speak, MD 07/03/16 [id number]

## 2016-07-02 NOTE — ED Triage Notes (Signed)
Right leg pain x 3 days. She was standing and felt a pop in her hip. Extensive orthopedic history. Swelling of her right knee. Her grand daughter feels she may be starting to get a septic leg.

## 2016-07-02 NOTE — Discharge Instructions (Signed)
Prednisone as prescribed.  Percocet as prescribed as needed for pain.  Follow-up with Dr. Noemi Chapel in the next week if symptoms are not improving.

## 2017-09-26 ENCOUNTER — Other Ambulatory Visit: Payer: Self-pay

## 2017-09-26 ENCOUNTER — Emergency Department (HOSPITAL_BASED_OUTPATIENT_CLINIC_OR_DEPARTMENT_OTHER): Payer: Medicare Other

## 2017-09-26 ENCOUNTER — Emergency Department (HOSPITAL_BASED_OUTPATIENT_CLINIC_OR_DEPARTMENT_OTHER)
Admission: EM | Admit: 2017-09-26 | Discharge: 2017-09-26 | Disposition: A | Discharge status: Home / Self Care | Payer: Medicare Other | Attending: Emergency Medicine | Admitting: Emergency Medicine

## 2017-09-26 ENCOUNTER — Encounter (HOSPITAL_BASED_OUTPATIENT_CLINIC_OR_DEPARTMENT_OTHER): Payer: Self-pay | Admitting: *Deleted

## 2017-09-26 DIAGNOSIS — Y9389 Activity, other specified: Secondary | ICD-10-CM | POA: Insufficient documentation

## 2017-09-26 DIAGNOSIS — E119 Type 2 diabetes mellitus without complications: Secondary | ICD-10-CM | POA: Insufficient documentation

## 2017-09-26 DIAGNOSIS — Z23 Encounter for immunization: Secondary | ICD-10-CM | POA: Insufficient documentation

## 2017-09-26 DIAGNOSIS — I10 Essential (primary) hypertension: Secondary | ICD-10-CM | POA: Insufficient documentation

## 2017-09-26 DIAGNOSIS — Y999 Unspecified external cause status: Secondary | ICD-10-CM | POA: Diagnosis not present

## 2017-09-26 DIAGNOSIS — S51812A Laceration without foreign body of left forearm, initial encounter: Secondary | ICD-10-CM

## 2017-09-26 DIAGNOSIS — W010XXA Fall on same level from slipping, tripping and stumbling without subsequent striking against object, initial encounter: Secondary | ICD-10-CM

## 2017-09-26 DIAGNOSIS — S098XXA Other specified injuries of head, initial encounter: Secondary | ICD-10-CM | POA: Diagnosis present

## 2017-09-26 DIAGNOSIS — Z79899 Other long term (current) drug therapy: Secondary | ICD-10-CM | POA: Insufficient documentation

## 2017-09-26 DIAGNOSIS — Z96649 Presence of unspecified artificial hip joint: Secondary | ICD-10-CM | POA: Diagnosis not present

## 2017-09-26 DIAGNOSIS — S0181XA Laceration without foreign body of other part of head, initial encounter: Secondary | ICD-10-CM | POA: Insufficient documentation

## 2017-09-26 DIAGNOSIS — W108XXA Fall (on) (from) other stairs and steps, initial encounter: Secondary | ICD-10-CM | POA: Diagnosis not present

## 2017-09-26 DIAGNOSIS — Z96651 Presence of right artificial knee joint: Secondary | ICD-10-CM | POA: Insufficient documentation

## 2017-09-26 DIAGNOSIS — Y92008 Other place in unspecified non-institutional (private) residence as the place of occurrence of the external cause: Secondary | ICD-10-CM | POA: Diagnosis not present

## 2017-09-26 MED ORDER — ACETAMINOPHEN 325 MG PO TABS
650.0000 mg | ORAL_TABLET | Freq: Once | ORAL | Status: DC
  Filled 2017-09-26: qty 2

## 2017-09-26 MED ORDER — LIDOCAINE HCL (PF) 1 % IJ SOLN
20.0000 mL | Freq: Once | INTRAMUSCULAR | Status: AC
  Administered 2017-09-26: 20 mL via INTRADERMAL

## 2017-09-26 MED ORDER — LIDOCAINE HCL 1 % IJ SOLN
INTRAMUSCULAR | Status: AC
  Filled 2017-09-26: qty 20

## 2017-09-26 MED ORDER — TETANUS-DIPHTH-ACELL PERTUSSIS 5-2.5-18.5 LF-MCG/0.5 IM SUSP
0.5000 mL | Freq: Once | INTRAMUSCULAR | Status: AC
  Administered 2017-09-26: 0.5 mL via INTRAMUSCULAR
  Filled 2017-09-26: qty 0.5

## 2017-09-26 NOTE — ED Notes (Signed)
EDP at bedside  

## 2017-09-26 NOTE — ED Provider Notes (Signed)
[facility] EMERGENCY DEPARTMENT Provider Note   CSN: [id number] Arrival date & time: 09/26/17  1350     History   Chief Complaint Chief Complaint  Patient presents with  . Head Injury    HPI [name] is a 84 y.o. female.  Patient s/p fall at home today.  Missed step on porch/deck, hit head on concrete. Dazed. No loc. Ambulatory since. No nausea or vomiting. Pain localized to laceration left forehead. Mild, dull. No neck or back pain. Small skin tear to left forearm. Tetanus 19 yrs ago. Denies other pain or injury. Felt fine all day, including just prior to fall. No faintness or dizziness. No fevers.    The history is provided by the patient and a relative.  Head Injury   Pertinent negatives include no vomiting.    Past Medical History:  Diagnosis Date  . Diabetes mellitus   . 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 Prosthetic joint infection ([place])   . Renal failure 05/27/2012  . HTN (hypertension) 10/17/2011  . 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 CHOLECYSTECTOMY    . IRRIGATION AND DEBRIDEMENT KNEE  09/12/2011   Procedure: IRRIGATION AND DEBRIDEMENT KNEE;  Surgeon: Kerin Salen;  Location: [place];  Service: Orthopedics;  Laterality: Right;  I&D RIGHT TKA REVISE BEARINGS/MBT. Poly exchange  . REPLACEMENT TOTAL KNEE    . TOTAL HIP ARTHROPLASTY      OB History    No data available       Home Medications    Prior to Admission medications   Medication Sig Start Date End Date Taking? Authorizing Provider  albuterol (PROVENTIL HFA;VENTOLIN HFA) 108 (90 BASE) MCG/ACT inhaler Inhale 2 puffs into the lungs every 6 (six) hours as needed. For shortness of breath     [provider]  ALPRAZolam (XANAX) 0.5 MG tablet Take 0.5 mg by mouth at bedtime as needed. For anxiety     [provider]    amLODipine (NORVASC) 10 MG tablet Take 10 mg by mouth daily.      [provider]  atenolol (TENORMIN) 25 MG tablet Take 1 tablet (25 mg total) by mouth daily. Patient taking differently: Take by mouth daily.  09/16/11 07/02/16  Roselee Nova, PA-C  atenolol (TENORMIN) 50 MG tablet Take 50 mg by mouth daily.      [provider]  BESIVANCE 0.6 % SUSP  09/04/12   [provider]  CELEBREX 200 MG capsule  01/03/12   [provider]  cloNIDine (CATAPRES) 0.1 MG tablet Take 1 tablet (0.1 mg total) by mouth 2 (two) times daily. Patient taking differently: Take 0.2 mg by mouth 2 (two) times daily.  09/16/11   Roselee Nova, PA-C  cyclobenzaprine (FLEXERIL) 5 MG tablet Take 5 mg by mouth 3 (three) times daily as needed. For pain     [provider]  DUREZOL 0.05 % EMUL  09/04/12   [provider]  esomeprazole (NEXIUM) 40 MG capsule Take 40 mg by mouth daily before breakfast.      [provider]  fluticasone (FLOVENT HFA) 110 MCG/ACT inhaler Inhale 1 puff into the lungs 2 (two) times daily.      [provider]  furosemide (LASIX) 80 MG tablet  01/18/12   [provider]  gabapentin (NEURONTIN) 300 MG capsule  Take 300 mg by mouth 2 (two) times daily.      [provider]  HYDROcodone-acetaminophen (NORCO) 10-325 MG per tablet Take 1 tablet by mouth every 6 (six) hours as needed. For pain     [provider]  oxyCODONE-acetaminophen (PERCOCET) 5-325 MG tablet Take 1-2 tablets by mouth every 6 (six) hours as needed. 07/02/16   Veryl Speak, MD  PATADAY 0.2 % SOLN  07/02/12   [provider]  predniSONE (DELTASONE) 10 MG tablet Take 2 tablets (20 mg total) by mouth 2 (two) times daily. 07/02/16   Veryl Speak, MD  zolpidem Lorrin Mais) 5 MG tablet  01/20/12   [provider]    Family History History reviewed. No pertinent family history.  Social History Social History   Tobacco Use   . Smoking status: Never Smoker  . Smokeless tobacco: Never Used  Substance Use Topics  . Alcohol use: No  . Drug use: No     Allergies   Hydrochlorothiazide; Lipitor [atorvastatin calcium]; Thiazide-type diuretics; and Zocor [simvastatin]   Review of Systems Review of Systems  Constitutional: Negative for fever.  HENT: Negative for nosebleeds.   Eyes: Negative for pain and visual disturbance.  Respiratory: Negative for shortness of breath.   Cardiovascular: Negative for chest pain.  Gastrointestinal: Negative for abdominal pain and vomiting.  Genitourinary: Negative for flank pain.  Musculoskeletal: Negative for back pain and neck pain.  Skin: Positive for wound.  Neurological: Negative for headaches.  Hematological: Does not bruise/bleed easily.  Psychiatric/Behavioral: Negative for confusion.     Physical Exam Updated Vital Signs BP (!) 162/80   Pulse 81   Temp 98 F (36.7 C) (Oral)   Resp 16   Ht 1.6 m (5\' 3" )   Wt 77.1 kg (170 lb)   SpO2 98%   BMI 30.11 kg/m   Physical Exam  Constitutional: She appears well-developed and well-nourished. No distress.  HENT:  Mouth/Throat: Oropharynx is clear and moist.  3.5 cm laceration left forehead.   Eyes: Conjunctivae are normal. Pupils are equal, round, and reactive to light. No scleral icterus.  Neck: Normal range of motion. Neck supple. No tracheal deviation present.  Cardiovascular: Normal rate, regular rhythm, normal heart sounds and intact distal pulses.  Pulmonary/Chest: Effort normal and breath sounds normal. No respiratory distress.  Abdominal: Soft. Normal appearance and bowel sounds are normal. She exhibits no distension. There is no tenderness.  Musculoskeletal: She exhibits no edema.  Superficial skin tear to left forearm. CTLS spine, non tender, aligned, no step off. No focal bony tenderness on extremity exam.   Neurological: She is alert.  Speech normal. Motor intact bil. sens grossly intact. Steady  gait.   Skin: Skin is warm and dry. No rash noted. She is not diaphoretic.  Psychiatric: She has a normal mood and affect.  Nursing note and vitals reviewed.    ED Treatments / Results  Labs (all labs ordered are listed, but only abnormal results are displayed) Labs Reviewed - No data to display  EKG  EKG Interpretation None       Radiology Ct Head Wo Contrast  Result Date: 09/26/2017 CLINICAL DATA:  84 year old female with head injury from fall today. Acute headache. EXAM: CT HEAD WITHOUT CONTRAST TECHNIQUE: Contiguous axial images were obtained from the base of the skull through the vertex without intravenous contrast. COMPARISON:  12/02/2009 CT FINDINGS: Brain: No evidence of acute infarction, hemorrhage, hydrocephalus, extra-axial collection or mass lesion/mass effect. Atrophy and mild chronic small-vessel white matter  ischemic changes again noted. Vascular: Atherosclerotic calcifications noted. Skull: No acute abnormality Sinuses/Orbits: No acute finding. Other: Left forehead/ scalp soft tissue injury noted. IMPRESSION: 1. No evidence of acute intracranial abnormality. 2. Left scalp/forehead soft tissue injury without fracture. 3. Atrophy and mild chronic small-vessel white matter ischemic changes. Electronically Signed   By: Margarette Canada M.D.   On: 09/26/2017 14:30    Procedures .Marland KitchenLaceration Repair Date/Time: 09/26/2017 2:48 PM Performed by: Lajean Saver, MD Authorized by: Lajean Saver, MD   Anesthesia (see MAR for exact dosages):    Anesthesia method:  Local infiltration   Local anesthetic:  Lidocaine 1% w/o epi Laceration details:    Location:  Face   Face location:  Forehead   Length (cm):  3.5 Repair type:    Repair type:  Simple Pre-procedure details:    Preparation:  Patient was prepped and draped in usual sterile fashion Exploration:    Wound exploration: entire depth of wound probed and visualized     Contaminated: no   Treatment:    Area cleansed with:   Betadine   Amount of cleaning:  Standard   Irrigation solution:  Sterile saline   Irrigation method:  Syringe Skin repair:    Repair method:  Sutures   Suture size:  6-0   Suture material:  Prolene   Number of sutures:  8 Approximation:    Approximation:  Close   Vermilion border: well-aligned   Post-procedure details:    Patient tolerance of procedure:  Tolerated well, no immediate complications   (including critical care time)  Medications Ordered in ED Medications  lidocaine (XYLOCAINE) 1 % (with pres) injection (not administered)  Tdap (BOOSTRIX) injection 0.5 mL (not administered)  acetaminophen (TYLENOL) tablet 650 mg (not administered)     Initial Impression / Assessment and Plan / ED Course  I have reviewed the triage vital signs and the nursing notes.  Pertinent labs & imaging results that were available during my care of the patient were reviewed by me and considered in my medical decision making (see chart for details).  Ct negative acute.  Wound sutured.   Acetaminophen po.  Reviewed nursing notes and prior charts for additional history.   Skin tear cleaned, sterile dressing.     Final Clinical Impressions(s) / ED Diagnoses   Final diagnoses:  None    ED Discharge Orders    None       Lajean Saver, MD 09/26/17 [phone]

## 2017-09-26 NOTE — Discharge Instructions (Signed)
It was our pleasure to provide your ER care today - we hope that you feel better.  Keep wound very clean.  Have sutures removed, your doctor or urgent care, in 1 week.  Return to ER if worse, new symptoms, new or severe pain, infection of wound, other concern.

## 2017-09-26 NOTE — ED Triage Notes (Signed)
Pt c/o fall with head injury lac to left forehead x 1 hr ago denies blood thinners

## 2017-11-29 ENCOUNTER — Encounter (HOSPITAL_BASED_OUTPATIENT_CLINIC_OR_DEPARTMENT_OTHER): Payer: Self-pay | Admitting: Emergency Medicine

## 2017-11-29 ENCOUNTER — Other Ambulatory Visit: Payer: Self-pay

## 2017-11-29 ENCOUNTER — Emergency Department (HOSPITAL_BASED_OUTPATIENT_CLINIC_OR_DEPARTMENT_OTHER): Payer: Medicare Other

## 2017-11-29 ENCOUNTER — Observation Stay (HOSPITAL_BASED_OUTPATIENT_CLINIC_OR_DEPARTMENT_OTHER)
Admission: EM | Admit: 2017-11-29 | Discharge: 2017-12-01 | Disposition: A | Discharge status: Home / Self Care | Payer: Medicare Other | Attending: Internal Medicine | Admitting: Internal Medicine

## 2017-11-29 DIAGNOSIS — M25561 Pain in right knee: Secondary | ICD-10-CM

## 2017-11-29 DIAGNOSIS — M25551 Pain in right hip: Secondary | ICD-10-CM | POA: Diagnosis not present

## 2017-11-29 DIAGNOSIS — M25552 Pain in left hip: Secondary | ICD-10-CM

## 2017-11-29 DIAGNOSIS — R262 Difficulty in walking, not elsewhere classified: Secondary | ICD-10-CM | POA: Diagnosis not present

## 2017-11-29 DIAGNOSIS — N182 Chronic kidney disease, stage 2 (mild): Secondary | ICD-10-CM | POA: Insufficient documentation

## 2017-11-29 DIAGNOSIS — Z79899 Other long term (current) drug therapy: Secondary | ICD-10-CM | POA: Insufficient documentation

## 2017-11-29 DIAGNOSIS — I10 Essential (primary) hypertension: Secondary | ICD-10-CM | POA: Diagnosis not present

## 2017-11-29 DIAGNOSIS — Z7901 Long term (current) use of anticoagulants: Secondary | ICD-10-CM | POA: Diagnosis not present

## 2017-11-29 DIAGNOSIS — F419 Anxiety disorder, unspecified: Secondary | ICD-10-CM

## 2017-11-29 DIAGNOSIS — G894 Chronic pain syndrome: Secondary | ICD-10-CM | POA: Insufficient documentation

## 2017-11-29 DIAGNOSIS — I129 Hypertensive chronic kidney disease with stage 1 through stage 4 chronic kidney disease, or unspecified chronic kidney disease: Secondary | ICD-10-CM | POA: Diagnosis not present

## 2017-11-29 DIAGNOSIS — E1122 Type 2 diabetes mellitus with diabetic chronic kidney disease: Secondary | ICD-10-CM | POA: Diagnosis not present

## 2017-11-29 DIAGNOSIS — Z96651 Presence of right artificial knee joint: Secondary | ICD-10-CM | POA: Insufficient documentation

## 2017-11-29 DIAGNOSIS — M25562 Pain in left knee: Secondary | ICD-10-CM

## 2017-11-29 DIAGNOSIS — G8929 Other chronic pain: Secondary | ICD-10-CM | POA: Diagnosis present

## 2017-11-29 DIAGNOSIS — R52 Pain, unspecified: Secondary | ICD-10-CM | POA: Diagnosis present

## 2017-11-29 DIAGNOSIS — Z888 Allergy status to other drugs, medicaments and biological substances status: Secondary | ICD-10-CM | POA: Insufficient documentation

## 2017-11-29 DIAGNOSIS — M1611 Unilateral primary osteoarthritis, right hip: Secondary | ICD-10-CM | POA: Diagnosis not present

## 2017-11-29 DIAGNOSIS — Z96642 Presence of left artificial hip joint: Secondary | ICD-10-CM | POA: Insufficient documentation

## 2017-11-29 DIAGNOSIS — Z885 Allergy status to narcotic agent status: Secondary | ICD-10-CM | POA: Insufficient documentation

## 2017-11-29 HISTORY — DX: Essential (primary) hypertension: I10

## 2017-11-29 LAB — CBC WITH DIFFERENTIAL/PLATELET
Basophils Absolute: 0 10*3/uL (ref 0.0–0.1)
Basophils Relative: 0 %
Eosinophils Absolute: 0.1 10*3/uL (ref 0.0–0.7)
Eosinophils Relative: 0 %
HEMATOCRIT: 35.5 % — AB (ref 36.0–46.0)
HEMOGLOBIN: 11.7 g/dL — AB (ref 12.0–15.0)
LYMPHS ABS: 1.1 10*3/uL (ref 0.7–4.0)
LYMPHS PCT: 9 %
MCH: 30.2 pg (ref 26.0–34.0)
MCHC: 33 g/dL (ref 30.0–36.0)
MCV: 91.7 fL (ref 78.0–100.0)
Monocytes Absolute: 0.7 10*3/uL (ref 0.1–1.0)
Monocytes Relative: 5 %
Neutro Abs: 10.8 10*3/uL — ABNORMAL HIGH (ref 1.7–7.7)
Neutrophils Relative %: 86 %
Platelets: 252 10*3/uL (ref 150–400)
RBC: 3.87 MIL/uL (ref 3.87–5.11)
RDW: 12.7 % (ref 11.5–15.5)
WBC: 12.6 10*3/uL — AB (ref 4.0–10.5)

## 2017-11-29 LAB — URINALYSIS, ROUTINE W REFLEX MICROSCOPIC
Bilirubin Urine: NEGATIVE
GLUCOSE, UA: NEGATIVE mg/dL
Hgb urine dipstick: NEGATIVE
Ketones, ur: NEGATIVE mg/dL
LEUKOCYTES UA: NEGATIVE
Nitrite: NEGATIVE
PH: 6 (ref 5.0–8.0)
PROTEIN: NEGATIVE mg/dL
Specific Gravity, Urine: 1.02 (ref 1.005–1.030)

## 2017-11-29 LAB — BASIC METABOLIC PANEL
Anion gap: 10 (ref 5–15)
BUN: 32 mg/dL — AB (ref 6–20)
CHLORIDE: 103 mmol/L (ref 101–111)
CO2: 23 mmol/L (ref 22–32)
Calcium: 9 mg/dL (ref 8.9–10.3)
Creatinine, Ser: 1.33 mg/dL — ABNORMAL HIGH (ref 0.44–1.00)
GFR calc Af Amer: 42 mL/min — ABNORMAL LOW (ref >60)
GFR calc non Af Amer: 36 mL/min — ABNORMAL LOW (ref >60)
GLUCOSE: 168 mg/dL — AB (ref 65–99)
POTASSIUM: 4.7 mmol/L (ref 3.5–5.1)
Sodium: 136 mmol/L (ref 135–145)

## 2017-11-29 LAB — C-REACTIVE PROTEIN: CRP: 9.2 mg/dL — ABNORMAL HIGH (ref <1.0)

## 2017-11-29 LAB — SEDIMENTATION RATE: Sed Rate: 36 mm/hr — ABNORMAL HIGH (ref 0–22)

## 2017-11-29 LAB — TROPONIN I: Troponin I: <0.03 ng/mL (ref <0.03)

## 2017-11-29 MED ORDER — ACETAMINOPHEN 650 MG RE SUPP: 650.0000 mg | Freq: Four times a day (QID) | RECTAL | Status: DC | PRN

## 2017-11-29 MED ORDER — GABAPENTIN 300 MG PO CAPS
300.0000 mg | ORAL_CAPSULE | Freq: Two times a day (BID) | ORAL | Status: DC
  Administered 2017-11-30: 300 mg via ORAL
  Filled 2017-11-29 (×2): qty 1

## 2017-11-29 MED ORDER — ACETAMINOPHEN 500 MG PO TABS
ORAL_TABLET | ORAL | Status: AC
  Filled 2017-11-29: qty 2

## 2017-11-29 MED ORDER — PANTOPRAZOLE SODIUM 40 MG PO TBEC
40.0000 mg | DELAYED_RELEASE_TABLET | Freq: Every day | ORAL | Status: DC
  Administered 2017-11-30 – 2017-12-01 (×2): 40 mg via ORAL
  Filled 2017-11-29 (×2): qty 1

## 2017-11-29 MED ORDER — MORPHINE SULFATE (PF) 4 MG/ML IV SOLN
4.0000 mg | Freq: Once | INTRAVENOUS | Status: AC
  Administered 2017-11-29: 4 mg via INTRAVENOUS
  Filled 2017-11-29: qty 1

## 2017-11-29 MED ORDER — ENOXAPARIN SODIUM 40 MG/0.4ML ~~LOC~~ SOLN: 40.0000 mg | SUBCUTANEOUS | Status: DC

## 2017-11-29 MED ORDER — MORPHINE SULFATE (PF) 2 MG/ML IV SOLN
1.0000 mg | INTRAVENOUS | Status: DC | PRN
  Administered 2017-11-29 – 2017-11-30 (×2): 1 mg via INTRAVENOUS
  Filled 2017-11-29 (×3): qty 1

## 2017-11-29 MED ORDER — ALPRAZOLAM 0.5 MG PO TABS
0.5000 mg | ORAL_TABLET | Freq: Every evening | ORAL | Status: DC | PRN
  Administered 2017-12-01: 0.5 mg via ORAL
  Filled 2017-11-29: qty 1

## 2017-11-29 MED ORDER — METHOCARBAMOL 1000 MG/10ML IJ SOLN
500.0000 mg | Freq: Four times a day (QID) | INTRAVENOUS | Status: DC | PRN
  Administered 2017-11-30: 500 mg via INTRAVENOUS
  Filled 2017-11-29: qty 5
  Filled 2017-11-29: qty 550

## 2017-11-29 MED ORDER — PREDNISONE 10 MG (21) PO TBPK
ORAL_TABLET | Freq: Every day | ORAL | 0 refills | Status: DC
Start: 2017-11-29 — End: 2017-12-01

## 2017-11-29 MED ORDER — ACETAMINOPHEN 325 MG PO TABS: 650.0000 mg | ORAL_TABLET | Freq: Four times a day (QID) | ORAL | Status: DC | PRN

## 2017-11-29 MED ORDER — BUDESONIDE 0.25 MG/2ML IN SUSP
0.2500 mg | Freq: Two times a day (BID) | RESPIRATORY_TRACT | Status: DC
  Administered 2017-11-30 – 2017-12-01 (×3): 0.25 mg via RESPIRATORY_TRACT
  Filled 2017-11-29 (×3): qty 2

## 2017-11-29 MED ORDER — SODIUM CHLORIDE 0.9 % IV BOLUS (SEPSIS)
500.0000 mL | Freq: Once | INTRAVENOUS | Status: AC
  Administered 2017-11-29: 500 mL via INTRAVENOUS

## 2017-11-29 MED ORDER — METHYLPREDNISOLONE SODIUM SUCC 125 MG IJ SOLR
60.0000 mg | Freq: Once | INTRAMUSCULAR | Status: AC
  Administered 2017-11-29: 60 mg via INTRAVENOUS
  Filled 2017-11-29: qty 2

## 2017-11-29 MED ORDER — AMLODIPINE BESYLATE 10 MG PO TABS
10.0000 mg | ORAL_TABLET | Freq: Every day | ORAL | Status: DC
  Administered 2017-11-30 – 2017-12-01 (×2): 10 mg via ORAL
  Filled 2017-11-29 (×2): qty 1

## 2017-11-29 MED ORDER — ONDANSETRON HCL 4 MG/2ML IJ SOLN: 4.0000 mg | Freq: Four times a day (QID) | INTRAMUSCULAR | Status: DC | PRN

## 2017-11-29 MED ORDER — ZOLPIDEM TARTRATE 5 MG PO TABS: 5.0000 mg | ORAL_TABLET | Freq: Every evening | ORAL | Status: DC | PRN

## 2017-11-29 MED ORDER — ONDANSETRON HCL 4 MG PO TABS
4.0000 mg | ORAL_TABLET | Freq: Four times a day (QID) | ORAL | Status: DC | PRN
Start: 2017-11-29 — End: 2017-12-01

## 2017-11-29 MED ORDER — ATENOLOL 50 MG PO TABS
50.0000 mg | ORAL_TABLET | Freq: Every day | ORAL | Status: DC
  Filled 2017-11-29: qty 1

## 2017-11-29 MED ORDER — CLONIDINE HCL 0.1 MG PO TABS
0.2000 mg | ORAL_TABLET | Freq: Two times a day (BID) | ORAL | Status: DC
  Administered 2017-11-30 – 2017-12-01 (×4): 0.2 mg via ORAL
  Filled 2017-11-29 (×4): qty 2

## 2017-11-29 MED ORDER — HYDRALAZINE HCL 20 MG/ML IJ SOLN
5.0000 mg | INTRAMUSCULAR | Status: DC | PRN
  Administered 2017-11-30 (×2): 5 mg via INTRAVENOUS
  Filled 2017-11-29 (×2): qty 1

## 2017-11-29 MED ORDER — ALBUTEROL SULFATE (2.5 MG/3ML) 0.083% IN NEBU
3.0000 mL | INHALATION_SOLUTION | Freq: Four times a day (QID) | RESPIRATORY_TRACT | Status: DC | PRN
Start: 2017-11-29 — End: 2017-12-01

## 2017-11-29 MED ORDER — ACETAMINOPHEN 500 MG PO TABS
1000.0000 mg | ORAL_TABLET | Freq: Once | ORAL | Status: AC
  Administered 2017-11-29: 1000 mg via ORAL

## 2017-11-29 NOTE — ED Triage Notes (Signed)
Pain in her legs. Her family states she seems confused to them.

## 2017-11-29 NOTE — ED Notes (Signed)
Pt on auto VS and continuous pulse ox 

## 2017-11-29 NOTE — Discharge Instructions (Signed)
You were evaluated in the emergency department for pain in your shoulders knees and hips.  With your lab work did not show any obvious causes of the symptoms.  You were improved after some pain medication but we do not feel it safe to prescribe any further pain medicine.  We are putting you on some steroids to see if that would help with the inflammation.  I have let your primary care doctor' s 24 assistant know about what is happening now so they will be available if your symptoms worsen.

## 2017-11-29 NOTE — ED Notes (Signed)
Called Dr. Talbert Forest Corrington

## 2017-11-29 NOTE — ED Notes (Signed)
ED Provider at bedside. 

## 2017-11-29 NOTE — ED Notes (Signed)
MD at bedside during pt's transfer from bed to wheelchair.  Very difficult transfer observed by MD.

## 2017-11-29 NOTE — Progress Notes (Signed)
I have been asked to see the patient with known severe hip arthritis who is have all over body pain and perhaps worsening pain in R hip and leg.  I will see her in the morning with full consult note to follow evaluation.   Dorna Leitz

## 2017-11-29 NOTE — ED Provider Notes (Signed)
[place] EMERGENCY DEPARTMENT Provider Note   CSN: [id number] Arrival date & time: 11/29/17  1146     History   Chief Complaint Chief Complaint  Patient presents with  . Leg Pain    HPI WALKER PADDACK is a 84 y.o. female.  84 year old female who lives with family but is usually ambulatory and very active, here with 3 days of increased arthralgias.  She states everything hurts hips knees shoulders.  She has a history of knee replacements and left hip replacement and is planning on getting a right hip replacement at some point.  She is also had many remote fractures.  She had a fall in January but denies any recent falls.  History is via the patient and her daughter-in-law states she always is complaining of some amount of pain but is never limited her in getting around until this episode.  Patient denies any change in her medications states the hydrocodone does nothing for her.  She denies any recent illness no fevers cough chest pain shortness of breath abdominal pain vomiting diarrhea or urinary symptoms.  She rates her pain as severe but will not localize for me.  The history is provided by the patient and a relative.  Leg Pain   This is a chronic problem. The problem occurs constantly. The problem has not changed since onset.The pain is present in the left shoulder, right shoulder, right hip, left hip, left knee and right knee. The quality of the pain is described as sharp. The pain is at a severity of 10/10. Associated symptoms include limited range of motion and stiffness. The symptoms are aggravated by activity. She has tried OTC pain medications for the symptoms. The treatment provided no relief. There has been a history of trauma (remote).    Past Medical History:  Diagnosis Date  . Diabetes mellitus   . 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 Prosthetic joint infection ([place])   . Renal failure 05/27/2012  . HTN  (hypertension) 10/17/2011  . 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 CHOLECYSTECTOMY    . IRRIGATION AND DEBRIDEMENT KNEE  09/12/2011   Procedure: IRRIGATION AND DEBRIDEMENT KNEE;  Surgeon: Kerin Salen;  Location: [place];  Service: Orthopedics;  Laterality: Right;  I&D RIGHT TKA REVISE BEARINGS/MBT. Poly exchange  . REPLACEMENT TOTAL KNEE    . TOTAL HIP ARTHROPLASTY      OB History    No data available       Home Medications    Prior to Admission medications   Medication Sig Start Date End Date Taking? Authorizing Provider  albuterol (PROVENTIL HFA;VENTOLIN HFA) 108 (90 BASE) MCG/ACT inhaler Inhale 2 puffs into the lungs every 6 (six) hours as needed. For shortness of breath     [provider]  ALPRAZolam (XANAX) 0.5 MG tablet Take 0.5 mg by mouth at bedtime as needed. For anxiety     [provider]  amLODipine (NORVASC) 10 MG tablet Take 10 mg by mouth daily.      [provider]  atenolol (TENORMIN) 25 MG tablet Take 1 tablet (25 mg total) by mouth daily. Patient taking differently: Take by mouth daily.  09/16/11 07/02/16  Roselee Nova, PA-C  atenolol (TENORMIN) 50 MG tablet Take 50 mg by mouth daily.      [provider]  BESIVANCE 0.6 % SUSP  09/04/12   [provider]  CELEBREX 200 MG capsule  01/03/12   [provider]  cloNIDine (CATAPRES) 0.1 MG tablet Take 1 tablet (0.1 mg total) by mouth 2 (two) times daily. Patient taking differently: Take 0.2 mg by mouth 2 (two) times daily.  09/16/11   Roselee Nova, PA-C  cyclobenzaprine (FLEXERIL) 5 MG tablet Take 5 mg by mouth 3 (three) times daily as needed. For pain     [provider]  DUREZOL 0.05 % EMUL  09/04/12   [provider]  esomeprazole (NEXIUM) 40 MG capsule Take 40 mg by mouth daily before breakfast.      [provider]  fluticasone (FLOVENT HFA)  110 MCG/ACT inhaler Inhale 1 puff into the lungs 2 (two) times daily.      [provider]  furosemide (LASIX) 80 MG tablet  01/18/12   [provider]  gabapentin (NEURONTIN) 300 MG capsule Take 300 mg by mouth 2 (two) times daily.      [provider]  HYDROcodone-acetaminophen (NORCO) 10-325 MG per tablet Take 1 tablet by mouth every 6 (six) hours as needed. For pain     [provider]  oxyCODONE-acetaminophen (PERCOCET) 5-325 MG tablet Take 1-2 tablets by mouth every 6 (six) hours as needed. 07/02/16   Veryl Speak, MD  PATADAY 0.2 % SOLN  07/02/12   [provider]  predniSONE (DELTASONE) 10 MG tablet Take 2 tablets (20 mg total) by mouth 2 (two) times daily. 07/02/16   Veryl Speak, MD  zolpidem Lorrin Mais) 5 MG tablet  01/20/12   [provider]    Family History History reviewed. No pertinent family history.  Social History Social History   Tobacco Use  . Smoking status: Never Smoker  . Smokeless tobacco: Never Used  Substance Use Topics  . Alcohol use: No  . Drug use: No     Allergies   Hydrochlorothiazide; Lipitor [atorvastatin calcium]; Thiazide-type diuretics; and Zocor [simvastatin]   Review of Systems Review of Systems  Constitutional: Negative for chills and fever.  HENT: Negative for ear pain and sore throat.   Eyes: Negative for pain and redness.  Respiratory: Negative for cough and shortness of breath.   Cardiovascular: Negative for chest pain and palpitations.  Gastrointestinal: Negative for abdominal pain, diarrhea, nausea and vomiting.  Genitourinary: Negative for dysuria and frequency.  Musculoskeletal: Positive for arthralgias, gait problem and stiffness.  Skin: Negative for rash.  Neurological: Negative for dizziness, speech difficulty and headaches.     Physical Exam Updated Vital Signs BP (!) 176/74   Pulse 86   Temp 98.3 F (36.8 C) (Oral)   Resp 18   Wt 77.1 kg (170 lb)   SpO2 100%   BMI  30.11 kg/m   Physical Exam  Constitutional: She appears well-developed and well-nourished.  HENT:  Head: Normocephalic and atraumatic.  Eyes: Conjunctivae are normal.  Neck: Neck supple.  Cardiovascular: Normal rate and regular rhythm.  Pulmonary/Chest: Effort normal and breath sounds normal.  Abdominal: Soft. Bowel sounds are normal.  Musculoskeletal: She exhibits tenderness (tender rom of all large joints, no overlying errythema or warmth. ).  Neurological: She is alert. GCS eye subscore is 4. GCS verbal subscore is 5. GCS motor subscore is 6.  Skin: Skin is warm and dry. Capillary refill takes less than 2 seconds. No rash noted.  Psychiatric: She has a normal mood and affect.     ED Treatments / Results  Labs (  all labs ordered are listed, but only abnormal results are displayed) Labs Reviewed  CBC WITH DIFFERENTIAL/PLATELET - Abnormal; Notable for the following components:      Result Value   WBC 12.6 (*)    Hemoglobin 11.7 (*)    HCT 35.5 (*)    Neutro Abs 10.8 (*)    All other components within normal limits  BASIC METABOLIC PANEL - Abnormal; Notable for the following components:   Glucose, Bld 168 (*)    BUN 32 (*)    Creatinine, Ser 1.33 (*)    GFR calc non Af Amer 36 (*)    GFR calc Af Amer 42 (*)    All other components within normal limits  SEDIMENTATION RATE - Abnormal; Notable for the following components:   Sed Rate 36 (*)    All other components within normal limits  C-REACTIVE PROTEIN - Abnormal; Notable for the following components:   CRP 9.2 (*)    All other components within normal limits  TROPONIN I  URINALYSIS, ROUTINE W REFLEX MICROSCOPIC    EKG  EKG Interpretation  Date/Time:  Friday November 29 2017 12:32:11 EST Ventricular Rate:  93 PR Interval:    QRS Duration: 157 QT Interval:  366 QTC Calculation: 456 R Axis:   86 Text Interpretation:  Sinus rhythm Ventricular premature complex Right bundle branch block similar to prior 12/12 Confirmed  by Aletta Edouard [phone]) on 11/29/2017 12:44:59 PM       Radiology Dg Pelvis 1-2 Views  Result Date: 11/29/2017 CLINICAL DATA:  RIGHT hip and femoral pain, fell 1 month ago EXAM: PELVIS - 1-2 VIEW COMPARISON:  10/09/2010 FINDINGS: Osseous demineralization. IM nail with proximal locking screws in RIGHT femur. Prior LEFT hip replacement. Advanced degenerative changes of the RIGHT hip joint with joint space narrowing, bone-on-bone appearance, minimal sclerosis/spur formation, and subchondral cyst formation. Additional degenerative changes at visualized lower lumbar spine and pubic symphysis. No definite acute fracture, dislocation, or bone destruction. IMPRESSION: Prior LEFT hip arthroplasty and RIGHT femoral ORIF. Osseous demineralization with advanced degenerative changes of RIGHT hip joint. Electronically Signed   By: Lavonia Dana M.D.   On: 11/29/2017 16:57   Dg Femur, Min 2 Views Right  Result Date: 11/29/2017 CLINICAL DATA:  RIGHT hip and femur pain post fall 1 month ago EXAM: RIGHT FEMUR 2 VIEWS COMPARISON:  07/02/2016 FINDINGS: IM nail with proximal and distal locking screws identified in the RIGHT femur across a healed oblique mid to distal RIGHT diaphyseal fracture. RIGHT knee prosthesis noted. Advanced degenerative changes of RIGHT hip joint. Hardware appears intact without surrounding lucency. No acute fracture, dislocation, or bone destruction. IMPRESSION: Prior RIGHT femoral nailing with an old healed oblique mid to distal RIGHT femoral diaphyseal fracture. Advanced degenerative changes RIGHT hip joint. No acute abnormalities. Electronically Signed   By: Lavonia Dana M.D.   On: 11/29/2017 16:59    Procedures Procedures (including critical care time)  Medications Ordered in ED Medications  morphine 4 MG/ML injection 4 mg (4 mg Intravenous Given 11/29/17 1224)  sodium chloride 0.9 % bolus 500 mL (0 mLs Intravenous Stopped 11/29/17 1251)  morphine 4 MG/ML injection 4 mg (4 mg Intravenous Given  11/29/17 1306)  methylPREDNISolone sodium succinate (SOLU-MEDROL) 125 mg/2 mL injection 60 mg (60 mg Intravenous Given 11/29/17 1506)     Initial Impression / Assessment and Plan / ED Course  I have reviewed the triage vital signs and the nursing notes.  Pertinent labs & imaging results that were available during my  care of the patient were reviewed by me and considered in my medical decision making (see chart for details).  Clinical Course as of Nov 30 1905  Fri Nov 29, 2017  1407 Patient needed 2 doses of pain medicine but is much more comfortable.  She is complaining now of pain in her right shoulder and her right knee only but those areas show no obvious signs of infection.  Her lab work does show a slightly elevated white count of 12.6 and an elevated sed rate of 36.  CRP is pending.  Her BMP is about baseline  [MB]  1445 Cussed the NP covering the office.  She along with me is uncomfortable in sending the patient out with higher doses of narcotics and she is also on muscle relaxants and benzos.  She should be taking gabapentin.  We agreed that may be getting her on some steroids on a quick taper because she already has an [facility] follow-up next week.  I will review this with family  [MB]  1601 Reevaluated patient.  She was resting comfortably in bed but trying to get her up to ambulate her to get her in the wheelchair she is really struggling.  Not sure that she would be able to be cared for at home so she will need admission.  Seems to be isolated now to the right lower extremity giving her the most trouble so we will get some imaging of that and will talk to the hospitalist team regarding admission.  [MB]  1602 She was to be seeing Dr. Mayer [facility] orthopedics next week regarding potential hip replacement.  He is through [place].  [MB]  1628 Discussed with Trisha Mangle on-call PA Gaspar Skeeters.  He will put the patient on the list and they will be able to see the patient tomorrow.  [MB]    66 D/w Dr Erlinda Hong hospitalist who accept for transfer. [facility]  [MB]    Clinical Course User Index [MB] Hayden Rasmussen, MD      Final Clinical Impressions(s) / ED Diagnoses   Final diagnoses:  Chronic arthralgias of knees and hips    ED Discharge Orders    None       Hayden Rasmussen, MD 11/30/17 1909

## 2017-11-29 NOTE — Progress Notes (Signed)
[name], [name] Female, 84 y.o., 1932-01-19  Right hip pain, not able to ambulate, [facility] will see in am. Vital stable, mild leukocytosis, no fever.  Med surg obs.  Please call [place] admission/flow manager upon patient's arrival.

## 2017-11-29 NOTE — Progress Notes (Signed)
Patient received via ambulance from [facility] ED.  Complaining of pain, skin warm and dry, alert and oriented.  Family at bedside.  Oriented to room and callbell.  Given sandwich, jello and drink per her request.  Neuro and NV checks WNL.

## 2017-11-29 NOTE — H&P (Addendum)
History and Physical    [name] TGG:[id number] DOB: 1932/06/05 DOA: 11/29/2017  PCP: Curly Rim, MD  Patient coming from: Home.  Chief Complaint: Right hip pain.  HPI: [name] is a 84 y.o. female with history of hypertension, chronic kidney disease presents to the ER admits in [facility] with increasing pain in the right hip.  Patient states he has been having chronic pain and is scheduled to follow-up with Dr. Mayer Camel, orthopedic surgeon for possible surgery.  Over the last 24 hours patient's pain acutely worsened with difficulty walking walking few steps.  Denies any fall.  ED Course: In the ER patient was given pain medications and steroids and x-rays done show features consistent with right-sided osteoarthritis.  On-call orthopedic surgeon was notified and patient admitted for pain relief and further management per the time of my exam patient is not in distress.  Has pain on moving right hip.  Review of Systems: As per HPI, rest all negative.   Past Medical History:  Diagnosis Date  . Diabetes mellitus   . Hypertension   . Prosthetic joint infection [facility])     Past Surgical History:  Procedure Laterality Date  . CHOLECYSTECTOMY    . IRRIGATION AND DEBRIDEMENT KNEE  09/12/2011   Procedure: IRRIGATION AND DEBRIDEMENT KNEE;  Surgeon: Kerin Salen;  Location: [place];  Service: Orthopedics;  Laterality: Right;  I&D RIGHT TKA REVISE BEARINGS/MBT. Poly exchange  . REPLACEMENT TOTAL KNEE    . TOTAL HIP ARTHROPLASTY       reports that  has never smoked. she has never used smokeless tobacco. She reports that she does not drink alcohol or use drugs.  Allergies  Allergen Reactions  . Codeine Nausea Only  . Hydrochlorothiazide Other (See Comments)    unknown  . Lipitor [Atorvastatin Calcium] Other (See Comments)    unknown  . Lisinopril     angioedema  . Thiazide-Type Diuretics Other (See Comments)    unknown  . Zocor [Simvastatin] Swelling    Family History    Problem Relation Age of Onset  . Diabetes Mellitus II Daughter     Prior to Admission medications   Medication Sig Start Date End Date Taking? Authorizing Provider  albuterol (PROVENTIL HFA;VENTOLIN HFA) 108 (90 BASE) MCG/ACT inhaler Inhale 2 puffs into the lungs every 6 (six) hours as needed. For shortness of breath     [provider]  ALPRAZolam (XANAX) 0.5 MG tablet Take 0.5 mg by mouth at bedtime as needed. For anxiety     [provider]  amLODipine (NORVASC) 10 MG tablet Take 10 mg by mouth daily.      [provider]  atenolol (TENORMIN) 25 MG tablet Take 1 tablet (25 mg total) by mouth daily. Patient taking differently: Take by mouth daily.  09/16/11 07/02/16  Roselee Nova, PA-C  atenolol (TENORMIN) 50 MG tablet Take 50 mg by mouth daily.      [provider]  BESIVANCE 0.6 % SUSP  09/04/12   [provider]  CELEBREX 200 MG capsule  01/03/12   [provider]  cloNIDine (CATAPRES) 0.1 MG tablet Take 1 tablet (0.1 mg total) by mouth 2 (two) times daily. Patient taking differently: Take 0.2 mg by mouth 2 (two) times daily.  09/16/11   Roselee Nova, PA-C  cyclobenzaprine (FLEXERIL) 5 MG tablet Take 5 mg by mouth 3 (three) times daily as needed. For pain     [provider]  DUREZOL 0.05 % EMUL  09/04/12   [provider]  esomeprazole (NEXIUM) 40 MG capsule Take 40 mg by mouth daily before breakfast.      [provider]  fluticasone (FLOVENT HFA) 110 MCG/ACT inhaler Inhale 1 puff into the lungs 2 (two) times daily.      [provider]  furosemide (LASIX) 80 MG tablet  01/18/12   [provider]  gabapentin (NEURONTIN) 300 MG capsule Take 300 mg by mouth 2 (two) times daily.      [provider]  HYDROcodone-acetaminophen (NORCO) 10-325 MG per tablet Take 1 tablet by mouth every 6 (six) hours as needed. For pain     [provider]  oxyCODONE-acetaminophen  (PERCOCET) 5-325 MG tablet Take 1-2 tablets by mouth every 6 (six) hours as needed. 07/02/16   Veryl Speak, MD  PATADAY 0.2 % SOLN  07/02/12   [provider]  predniSONE (STERAPRED UNI-PAK 21 TAB) 10 MG (21) TBPK tablet Take by mouth daily. Take 6 tabs by mouth daily  for 2 days, then 5 tabs for 2 days, then 4 tabs for 2 days, then 3 tabs for 2 days, 2 tabs for 2 days, then 1 tab by mouth daily for 2 days 11/29/17   Hayden Rasmussen, MD  zolpidem [facility]) 5 MG tablet  01/20/12   [provider]    Physical Exam: Vitals:   11/29/17 1541 11/29/17 1850 11/29/17 1909 11/29/17 2050  BP: (!) 165/56  (!) 166/58 (!) 180/67  Pulse: 89  88 92  Resp: 16  16 16   Temp: 98.8 F (37.1 C) 98.8 F (37.1 C)  98.6 F (37 C)  TempSrc: Oral Oral  Oral  SpO2: 97%  95% 95%  Weight:          Constitutional: Moderately built and nourished. Vitals:   11/29/17 1541 11/29/17 1850 11/29/17 1909 11/29/17 2050  BP: (!) 165/56  (!) 166/58 (!) 180/67  Pulse: 89  88 92  Resp: 16  16 16   Temp: 98.8 F (37.1 C) 98.8 F (37.1 C)  98.6 F (37 C)  TempSrc: Oral Oral  Oral  SpO2: 97%  95% 95%  Weight:       Eyes: Anicteric no pallor. ENMT: No discharge from the ears eyes nose or mouth. Neck: No mass felt.  No neck rigidity. Respiratory: No rhonchi or crepitations. Cardiovascular: S1-S2 heard no murmurs appreciated. Abdomen: Soft nontender bowel sounds present. Musculoskeletal: Pain on moving right hip. Skin: No rash. Neurologic: Alert awake oriented to time place and person.  Moves all extremities. Psychiatric: Appears normal.  Normal affect.   Labs on Admission: I have personally reviewed following labs and imaging studies  CBC: Recent Labs  Lab 11/29/17 1214  WBC 12.6*  NEUTROABS 10.8*  HGB 11.7*  HCT 35.5*  MCV 91.7  PLT 242   Basic Metabolic Panel: Recent Labs  Lab 11/29/17 1214  NA 136  K 4.7  CL 103  CO2 23  GLUCOSE 168*  BUN 32*  CREATININE 1.33*  CALCIUM 9.0     GFR: Estimated Creatinine Clearance: 31.5 mL/min (A) (by C-G formula based on SCr of 1.33 mg/dL (H)). Liver Function Tests: No results for input(s): AST, ALT, ALKPHOS, BILITOT, PROT, ALBUMIN in the last 168 hours. No results for input(s): LIPASE, AMYLASE in the last 168 hours. No results for input(s): AMMONIA in the last 168 hours. Coagulation Profile: No results for input(s): INR, PROTIME in the last 168 hours. Cardiac Enzymes: Recent Labs  Lab 11/29/17 1214  TROPONINI <0.03   BNP (last 3 results) No results for input(s): PROBNP in the last 8760 hours. HbA1C: No results for input(s): HGBA1C in the last 72 hours. CBG: No results for input(s): GLUCAP in the last 168 hours. Lipid Profile: No results for input(s): CHOL, HDL, LDLCALC, TRIG, CHOLHDL, LDLDIRECT in the last 72 hours. Thyroid Function Tests: No results for input(s): TSH, T4TOTAL, FREET4, T3FREE, THYROIDAB in the last 72 hours. Anemia Panel: No results for input(s): VITAMINB12, FOLATE, FERRITIN, TIBC, IRON, RETICCTPCT in the last 72 hours. Urine analysis:    Component Value Date/Time   COLORURINE YELLOW 11/29/2017 [place] 11/29/2017 1252   LABSPEC 1.020 11/29/2017 1252   PHURINE 6.0 11/29/2017 1252   GLUCOSEU NEGATIVE 11/29/2017 1252   HGBUR NEGATIVE 11/29/2017 1252   BILIRUBINUR NEGATIVE 11/29/2017 1252   KETONESUR NEGATIVE 11/29/2017 1252   PROTEINUR NEGATIVE 11/29/2017 1252   UROBILINOGEN 0.2 09/10/2011 2155   NITRITE NEGATIVE 11/29/2017 1252   LEUKOCYTESUR NEGATIVE 11/29/2017 1252   Sepsis Labs: @LABRCNTIP (procalcitonin:4,lacticidven:4) )No results found for this or any previous visit (from the past 240 hour(s)).   Radiological Exams on Admission: Dg Pelvis 1-2 Views  Result Date: 11/29/2017 CLINICAL DATA:  RIGHT hip and femoral pain, fell 1 month ago EXAM: PELVIS - 1-2 VIEW COMPARISON:  10/09/2010 FINDINGS: Osseous demineralization. IM nail with proximal locking screws in RIGHT femur.  Prior LEFT hip replacement. Advanced degenerative changes of the RIGHT hip joint with joint space narrowing, bone-on-bone appearance, minimal sclerosis/spur formation, and subchondral cyst formation. Additional degenerative changes at visualized lower lumbar spine and pubic symphysis. No definite acute fracture, dislocation, or bone destruction. IMPRESSION: Prior LEFT hip arthroplasty and RIGHT femoral ORIF. Osseous demineralization with advanced degenerative changes of RIGHT hip joint. Electronically Signed   By: Lavonia Dana M.D.   On: 11/29/2017 16:57   Dg Femur, Min 2 Views Right  Result Date: 11/29/2017 CLINICAL DATA:  RIGHT hip and femur pain post fall 1 month ago EXAM: RIGHT FEMUR 2 VIEWS COMPARISON:  07/02/2016 FINDINGS: IM nail with proximal and distal locking screws identified in the RIGHT femur across a healed oblique mid to distal RIGHT diaphyseal fracture. RIGHT knee prosthesis noted. Advanced degenerative changes of RIGHT hip joint. Hardware appears intact without surrounding lucency. No acute fracture, dislocation, or bone destruction. IMPRESSION: Prior RIGHT femoral nailing with an old healed oblique mid to distal RIGHT femoral diaphyseal fracture. Advanced degenerative changes RIGHT hip joint. No acute abnormalities. Electronically Signed   By: Lavonia Dana M.D.   On: 11/29/2017 16:59    EKG: Independently reviewed.  Normal sinus rhythm with PVCs.  RBBB.  Assessment/Plan Principal Problem:   Right hip pain Active Problems:   HTN (hypertension)   CKD (chronic kidney disease) stage 2, GFR 60-89 ml/min    1. Right hip pain likely from severe osteoarthrosis -we will keep patient on morphine and Robaxin for now await orthopedic surgery recommendations.  Patient's CRP levels are elevated not sure if patient will undergo any procedure until then patient will be on SCDs and will hold off Lovenox for DVT.  Physical therapy consult. 2. Hypertension uncontrolled could be from pain.  On  amlodipine and clonidine and Tenormin which will be continued.  As needed IV hydralazine for systolic blood pressure more than 160. 3. Chronic kidney disease stage IV -creatinine appears to be at baseline. 4. Normocytic normochromic anemia appears to be chronic.  Likely from renal disease.  Follow CBC. 5. History of anxiety on Xanax. 6. Chronic pain  on gabapentin and hydrocodone.   DVT prophylaxis: SCDs. Code Status: Full code. Family Communication: Patient's daughter-in-law. Disposition Plan: To be determined. Consults called: Orthopedics. Admission status: Observation.   Rise Patience MD [facility] Pager [phone].  If 7PM-7AM, please contact night-coverage [email or web address] Password [facility]  11/29/2017, 10:29 PM

## 2017-11-29 NOTE — ED Notes (Signed)
Seven rings with patient and family has a hard time taking off the rings.  Upper and lower dentures with the patient.

## 2017-11-30 DIAGNOSIS — F419 Anxiety disorder, unspecified: Secondary | ICD-10-CM

## 2017-11-30 DIAGNOSIS — I10 Essential (primary) hypertension: Secondary | ICD-10-CM | POA: Diagnosis not present

## 2017-11-30 DIAGNOSIS — G894 Chronic pain syndrome: Secondary | ICD-10-CM

## 2017-11-30 DIAGNOSIS — M25551 Pain in right hip: Secondary | ICD-10-CM | POA: Diagnosis not present

## 2017-11-30 DIAGNOSIS — N182 Chronic kidney disease, stage 2 (mild): Secondary | ICD-10-CM | POA: Diagnosis not present

## 2017-11-30 LAB — CBC
HEMATOCRIT: 33.6 % — AB (ref 36.0–46.0)
HEMOGLOBIN: 11 g/dL — AB (ref 12.0–15.0)
MCH: 30.1 pg (ref 26.0–34.0)
MCHC: 32.7 g/dL (ref 30.0–36.0)
MCV: 91.8 fL (ref 78.0–100.0)
Platelets: 246 10*3/uL (ref 150–400)
RBC: 3.66 MIL/uL — ABNORMAL LOW (ref 3.87–5.11)
RDW: 12.8 % (ref 11.5–15.5)
WBC: 8.3 10*3/uL (ref 4.0–10.5)

## 2017-11-30 LAB — BASIC METABOLIC PANEL
ANION GAP: 8 (ref 5–15)
BUN: 25 mg/dL — ABNORMAL HIGH (ref 6–20)
CO2: 25 mmol/L (ref 22–32)
Calcium: 9.3 mg/dL (ref 8.9–10.3)
Chloride: 109 mmol/L (ref 101–111)
Creatinine, Ser: 0.8 mg/dL (ref 0.44–1.00)
GFR calc Af Amer: >60 mL/min (ref >60)
GLUCOSE: 144 mg/dL — AB (ref 65–99)
POTASSIUM: 4.5 mmol/L (ref 3.5–5.1)
Sodium: 142 mmol/L (ref 135–145)

## 2017-11-30 LAB — MAGNESIUM: Magnesium: 2.1 mg/dL (ref 1.7–2.4)

## 2017-11-30 MED ORDER — MORPHINE SULFATE (PF) 2 MG/ML IV SOLN
1.0000 mg | INTRAVENOUS | Status: DC | PRN
  Administered 2017-11-30: 2 mg via INTRAVENOUS

## 2017-11-30 MED ORDER — MORPHINE SULFATE 15 MG PO TABS
15.0000 mg | ORAL_TABLET | ORAL | Status: DC | PRN
Start: 2017-11-30 — End: 2017-12-01
  Administered 2017-11-30 – 2017-12-01 (×5): 15 mg via ORAL
  Filled 2017-11-30 (×5): qty 1

## 2017-11-30 MED ORDER — GABAPENTIN 300 MG PO CAPS
300.0000 mg | ORAL_CAPSULE | Freq: Three times a day (TID) | ORAL | Status: DC
  Administered 2017-11-30 – 2017-12-01 (×5): 300 mg via ORAL
  Filled 2017-11-30 (×4): qty 1

## 2017-11-30 MED ORDER — ENOXAPARIN SODIUM 30 MG/0.3ML ~~LOC~~ SOLN
30.0000 mg | SUBCUTANEOUS | Status: DC
  Administered 2017-11-30: 30 mg via SUBCUTANEOUS
  Filled 2017-11-30: qty 0.3

## 2017-11-30 MED ORDER — PREDNISONE 20 MG PO TABS
60.0000 mg | ORAL_TABLET | Freq: Every day | ORAL | Status: DC
  Administered 2017-12-01: 60 mg via ORAL
  Filled 2017-11-30: qty 3

## 2017-11-30 MED ORDER — ACETAMINOPHEN 500 MG PO TABS
500.0000 mg | ORAL_TABLET | Freq: Three times a day (TID) | ORAL | Status: DC
  Administered 2017-11-30 – 2017-12-01 (×5): 500 mg via ORAL
  Filled 2017-11-30 (×4): qty 1

## 2017-11-30 MED ORDER — ATENOLOL 50 MG PO TABS
75.0000 mg | ORAL_TABLET | Freq: Every day | ORAL | Status: DC
  Administered 2017-11-30 – 2017-12-01 (×2): 75 mg via ORAL
  Filled 2017-11-30: qty 1

## 2017-11-30 NOTE — Evaluation (Signed)
Physical Therapy Evaluation Patient Details Name: [name] MRN: [id number] DOB: [birth year] Today's Date: 11/30/2017   History of Present Illness  [name] is a 84 y.o. female.  84 year old female who lives with family but is usually ambulatory and very active, here with 3 days of increased arthralgias.  She states everything hurts hips knees shoulders.  She has a history of knee replacements and left hip replacement and is planning on getting a right hip replacement at some point.  She is also had many remote fractures.  She had a fall in January but denies any recent falls  Clinical Impression  Pt presents with decreased balance and mobility affecting her independence. Pt has general deconditioning, decreased safety awareness and decreased balance. Family reports she will have supervision at home for d/c. Pt is currently min guard assist with gait. Pt needs verbal cues for walker safety and to slow down. Pt was able to get in and out of bed with no assistance and pt reports R LE strength has improved. Recommend d/c home with family providing 24 hour supervision. Reinforced to daughter-in-law the need for RW and assistance with mobility. She verbally acknowledged and agreed with recommendations. HHPT services to reinforce safety and continue a strengthening program and balance activities. Pt will benefit from continued skilled PT services until medically d/c from hospital.    Follow Up Recommendations Home health PT    Equipment Recommendations  None recommended by PT    Recommendations for Other Services       Precautions / Restrictions Precautions Precautions: Fall Restrictions Weight Bearing Restrictions: No      Mobility  Bed Mobility Overal bed mobility: Modified Independent                Transfers Overall transfer level: Needs assistance Equipment used: Rolling walker (2 wheeled) Transfers: Stand Pivot Transfers   Stand pivot transfers: Min guard        General transfer comment: cues for safety and technique  Ambulation/Gait Ambulation/Gait assistance: Min guard Ambulation Distance (Feet): 100 Feet Assistive device: Rolling walker (2 wheeled) Gait Pattern/deviations: Step-to pattern;Decreased step length - left;Trunk flexed Gait velocity: too fast for safety concerns   General Gait Details: decreased safety with gait, walks to quickly and has some difficulty with left foot clearance possibly creating a tripping hazard. cues to slow down and stay inside the RW.  Stairs            Wheelchair Mobility    Modified Rankin (Stroke Patients Only)       Balance Overall balance assessment: Needs assistance Sitting-balance support: No upper extremity supported;Feet supported Sitting balance-Leahy Scale: Good     Standing balance support: Bilateral upper extremity supported Standing balance-Leahy Scale: Fair                               Pertinent Vitals/Pain Pain Assessment: 0-10 Pain Score: 4  Pain Location: R hip Pain Descriptors / Indicators: Discomfort Pain Intervention(s): Limited activity within patient's tolerance;Repositioned;Monitored during session    Home Living Family/patient expects to be discharged to:: Private residence Living Arrangements: Other relatives(grandson) Available Help at Discharge: Family;Available 24 hours/day Type of Home: Mobile home Home Access: Stairs to enter Entrance Stairs-Rails: Left Entrance Stairs-Number of Steps: 4 Home Layout: One level Home Equipment: Walker - 2 wheels;Bedside commode      Prior Function Level of Independence: Independent with assistive device(s)  Hand Dominance        Extremity/Trunk Assessment   Upper Extremity Assessment Upper Extremity Assessment: Defer to OT evaluation    Lower Extremity Assessment Lower Extremity Assessment: Generalized weakness       Communication   Communication: No difficulties   Cognition Arousal/Alertness: Awake/alert Behavior During Therapy: WFL for tasks assessed/performed Overall Cognitive Status: Within Functional Limits for tasks assessed                                        General Comments General comments (skin integrity, edema, etc.): Assist pt to the toilet. Pt attempted to stand with both hands on the RW and was very unsafe. Instructed pt to ask for assistance prior to standing which she did not do. Pt is a high fall risk and will need close supervision with mobility. Pt's daughter-in -law was presents and verified she will have 24 hour supervision. Reinforced she will need to use the RW for improved safety with mobility/    Exercises     Assessment/Plan    PT Assessment Patient needs continued PT services  PT Problem List Decreased strength;Decreased mobility;Decreased safety awareness;Decreased range of motion;Decreased knowledge of precautions;Decreased activity tolerance;Decreased balance       PT Treatment Interventions DME instruction;Therapeutic activities;Gait training;Therapeutic exercise;Patient/family education;Balance training;Stair training;Functional mobility training    PT Goals (Current goals can be found in the Care Plan section)  Acute Rehab PT Goals Patient Stated Goal: To go home PT Goal Formulation: With patient Time For Goal Achievement: 12/07/17 Potential to Achieve Goals: Good    Frequency Min 3X/week   Barriers to discharge        Co-evaluation               AM-PAC PT "6 Clicks" Daily Activity  Outcome Measure Difficulty turning over in bed (including adjusting bedclothes, sheets and blankets)?: A Little Difficulty moving from lying on back to sitting on the side of the bed? : A Little Difficulty sitting down on and standing up from a chair with arms (e.g., wheelchair, bedside commode, etc,.)?: A Little Help needed moving to and from a bed to chair (including a wheelchair)?: A  Little Help needed walking in hospital room?: A Little Help needed climbing 3-5 steps with a railing? : A Little 6 Click Score: 18    End of Session Equipment Utilized During Treatment: Gait belt Activity Tolerance: Patient tolerated treatment well Patient left: in bed;with call bell/phone within reach;with family/visitor present Nurse Communication: Mobility status PT Visit Diagnosis: Muscle weakness (generalized) (M62.81);Difficulty in walking, not elsewhere classified (R26.2)    Time: 4158-3094 PT Time Calculation (min) (ACUTE ONLY): 35 min   Charges:   PT Evaluation $PT Eval Moderate Complexity: 1 Mod PT Treatments $Gait Training: 8-22 mins   PT G Codes:        Theodoro Grist, PT  Lelon Mast 11/30/2017, 12:50 PM

## 2017-11-30 NOTE — Progress Notes (Signed)
PROGRESS NOTE    [name]  WUJ:[id number] DOB: 04/01/1932 DOA: 11/29/2017 PCP: Curly Rim, MD   Brief Narrative:  [name] is a 84 y.o. female with history of hypertension, chronic kidney disease presents to the ER admits in [facility] with increasing pain in the right hip.  Patient states he has been having chronic pain and is scheduled to follow-up with Dr. Mayer Camel, orthopedic surgeon for possible surgery.  Over the last 24 hours patient's pain acutely worsened with difficulty walking walking few steps.  Denies any fall.  ED Course: In the ER patient was given pain medications and steroids and x-rays done show features consistent with right-sided osteoarthritis.  On-call orthopedic surgeon was notified and patient admitted for pain relief and further management per the time of my exam patient is not in distress.  Has pain on moving right hip.      Assessment & Plan:   Principal Problem:   Right hip pain Active Problems:   HTN (hypertension)   CKD (chronic kidney disease) stage 2, GFR 60-89 ml/min  #1 right hip pain likely secondary to severe osteoarthritis She has been assessed by orthopedics who feel patient does not have any infection at this time and in no need of surgical intervention.  Continue current pain regimen with MSIR and IV morphine as needed.  Increase Neurontin to 3 times daily.  Continue Robaxin.  Placed on a short course of oral prednisone 60 mg daily times 5 days.  PT/OT.  Patient will need outpatient follow-up with orthopedics.  2.  Hypertension Continue home regimen of Norvasc, atenolol, clonidine.  3.  Chronic kidney disease stage II Stable.  4.  Chronic anemia Stable.  Follow H&H.  5.  Anxiety Xanax as needed.  6.  Chronic pain syndrome We will increase patient's Neurontin to 3 times daily secondary to problem #1.  MSIR as needed.  Follow.    DVT prophylaxis: Lovenox Code Status: Full Family Communication: Updated patient.  No  family at bedside. Disposition Plan: Home with home health once pain is controlled.   Consultants:   Orthopedics: Dr. Berenice Primas 11/30/2017    Procedures:   Plain films of the right femur 11/29/2017  Antimicrobials:   None   Subjective: Patient complaining of right lower extremity pain and discomfort.  No nausea or emesis.  No chest pain or shortness of breath.  Objective: Vitals:   11/29/17 2050 11/30/17 0604 11/30/17 1029 11/30/17 1452  BP: (!) 180/67 (!) 176/90  (!) 155/51  Pulse: 92 85  80  Resp: 16 18  16   Temp: 98.6 F (37 C) 97.6 F (36.4 C)  98.6 F (37 C)  TempSrc: Oral Oral  Oral  SpO2: 95% 96% 95% 94%  Weight: 70.1 kg (154 lb 9 oz)       Intake/Output Summary (Last 24 hours) at 11/30/2017 1632 Last data filed at 11/30/2017 0200 Gross per 24 hour  Intake 55 ml  Output -  Net 55 ml   Filed Weights   11/29/17 1150 11/29/17 2050  Weight: 77.1 kg (170 lb) 70.1 kg (154 lb 9 oz)    Examination:  General exam: Mild distress Respiratory system: Clear to auscultation. Respiratory effort normal. Cardiovascular system: S1 & S2 heard, RRR. No JVD, murmurs, rubs, gallops or clicks. No pedal edema. Gastrointestinal system: Abdomen is nondistended, soft and nontender. No organomegaly or masses felt. Normal bowel sounds heard. Central nervous system: Alert and oriented. No focal neurological deficits. Extremities: Right hip tender to  palpation.  Symmetric 5 x 5 power. Skin: No rashes, lesions or ulcers Psychiatry: Judgement and insight appear normal. Mood & affect appropriate.     Data Reviewed: I have personally reviewed following labs and imaging studies  CBC: Recent Labs  Lab 11/29/17 1214 11/30/17 0530  WBC 12.6* 8.3  NEUTROABS 10.8*  --   HGB 11.7* 11.0*  HCT 35.5* 33.6*  MCV 91.7 91.8  PLT 252 867   Basic Metabolic Panel: Recent Labs  Lab 11/29/17 1214 11/30/17 0530  NA 136 142  K 4.7 4.5  CL 103 109  CO2 23 25  GLUCOSE 168* 144*  BUN 32* 25*    CREATININE 1.33* 0.80  CALCIUM 9.0 9.3  MG  --  2.1   GFR: Estimated Creatinine Clearance: 50 mL/min (by C-G formula based on SCr of 0.8 mg/dL). Liver Function Tests: No results for input(s): AST, ALT, ALKPHOS, BILITOT, PROT, ALBUMIN in the last 168 hours. No results for input(s): LIPASE, AMYLASE in the last 168 hours. No results for input(s): AMMONIA in the last 168 hours. Coagulation Profile: No results for input(s): INR, PROTIME in the last 168 hours. Cardiac Enzymes: Recent Labs  Lab 11/29/17 1214  TROPONINI <0.03   BNP (last 3 results) No results for input(s): PROBNP in the last 8760 hours. HbA1C: No results for input(s): HGBA1C in the last 72 hours. CBG: No results for input(s): GLUCAP in the last 168 hours. Lipid Profile: No results for input(s): CHOL, HDL, LDLCALC, TRIG, CHOLHDL, LDLDIRECT in the last 72 hours. Thyroid Function Tests: No results for input(s): TSH, T4TOTAL, FREET4, T3FREE, THYROIDAB in the last 72 hours. Anemia Panel: No results for input(s): VITAMINB12, FOLATE, FERRITIN, TIBC, IRON, RETICCTPCT in the last 72 hours. Sepsis Labs: No results for input(s): PROCALCITON, LATICACIDVEN in the last 168 hours.  No results found for this or any previous visit (from the past 240 hour(s)).       Radiology Studies: Dg Pelvis 1-2 Views  Result Date: 11/29/2017 CLINICAL DATA:  RIGHT hip and femoral pain, fell 1 month ago EXAM: PELVIS - 1-2 VIEW COMPARISON:  10/09/2010 FINDINGS: Osseous demineralization. IM nail with proximal locking screws in RIGHT femur. Prior LEFT hip replacement. Advanced degenerative changes of the RIGHT hip joint with joint space narrowing, bone-on-bone appearance, minimal sclerosis/spur formation, and subchondral cyst formation. Additional degenerative changes at visualized lower lumbar spine and pubic symphysis. No definite acute fracture, dislocation, or bone destruction. IMPRESSION: Prior LEFT hip arthroplasty and RIGHT femoral ORIF.  Osseous demineralization with advanced degenerative changes of RIGHT hip joint. Electronically Signed   By: Lavonia Dana M.D.   On: 11/29/2017 16:57   Dg Femur, Min 2 Views Right  Result Date: 11/29/2017 CLINICAL DATA:  RIGHT hip and femur pain post fall 1 month ago EXAM: RIGHT FEMUR 2 VIEWS COMPARISON:  07/02/2016 FINDINGS: IM nail with proximal and distal locking screws identified in the RIGHT femur across a healed oblique mid to distal RIGHT diaphyseal fracture. RIGHT knee prosthesis noted. Advanced degenerative changes of RIGHT hip joint. Hardware appears intact without surrounding lucency. No acute fracture, dislocation, or bone destruction. IMPRESSION: Prior RIGHT femoral nailing with an old healed oblique mid to distal RIGHT femoral diaphyseal fracture. Advanced degenerative changes RIGHT hip joint. No acute abnormalities. Electronically Signed   By: Lavonia Dana M.D.   On: 11/29/2017 16:59        Scheduled Meds: . acetaminophen  500 mg Oral TID  . amLODipine  10 mg Oral Daily  . atenolol  75  mg Oral Daily  . budesonide  0.25 mg Nebulization BID  . cloNIDine  0.2 mg Oral BID  . gabapentin  300 mg Oral TID  . pantoprazole  40 mg Oral Daily  . predniSONE  60 mg Oral QAC breakfast   Continuous Infusions: . methocarbamol (ROBAXIN)  IV Stopped (11/30/17 0112)     LOS: 0 days    Time spent: 35 mins    Irine Seal, MD [facility] Pager [phone]  If 7PM-7AM, please contact night-coverage [email or web address] Password [facility] 11/30/2017, 4:32 PM

## 2017-11-30 NOTE — Consult Note (Signed)
Reason for Consult: All over body pain specifically located to the right leg and hip Referring Physician: Hospitalist  [name] is an 84 y.o. female.  HPI: Patient is an 84 year old female with long history of known significant arthritis in the right hip who was evaluated yesterday and noted to have significant pain in all of her extremities and joints.  After initial treatment the seem to improve everywhere except for her right hip and knee.  The patient states that the pain is predominantly radiating all the way down her leg.  She states that her hip pain was really minimally changed with her intra-articular hip injection.  She is not felt sick or feverish at home.  She was seen in the emergency room and ultimately given some steroids and has been feeling some better.  We are evaluating her specifically for her right hip and knee pain and concerns for any possibility of infection.  Past Medical History:  Diagnosis Date  . Diabetes mellitus   . Hypertension   . Prosthetic joint infection [facility])     Past Surgical History:  Procedure Laterality Date  . CHOLECYSTECTOMY    . IRRIGATION AND DEBRIDEMENT KNEE  09/12/2011   Procedure: IRRIGATION AND DEBRIDEMENT KNEE;  Surgeon: Kerin Salen;  Location: [place];  Service: Orthopedics;  Laterality: Right;  I&D RIGHT TKA REVISE BEARINGS/MBT. Poly exchange  . REPLACEMENT TOTAL KNEE    . TOTAL HIP ARTHROPLASTY      Family History  Problem Relation Age of Onset  . Diabetes Mellitus II Daughter     Social History:  reports that  has never smoked. she has never used smokeless tobacco. She reports that she does not drink alcohol or use drugs.  Allergies:  Allergies  Allergen Reactions  . Codeine Nausea Only  . Hydrochlorothiazide Other (See Comments)    unknown  . Lipitor [Atorvastatin Calcium] Other (See Comments)    unknown  . Lisinopril     angioedema  . Thiazide-Type Diuretics Other (See Comments)    unknown  . Zocor [Simvastatin]  Swelling    Medications: I have reviewed the patient's current medications.  Results for orders placed or performed during the hospital encounter of 11/29/17 (from the past 48 hour(s))  CBC with Differential     Status: Abnormal   Collection Time: 11/29/17 12:14 PM  Result Value Ref Range   WBC 12.6 (H) 4.0 - 10.5 K/uL   RBC 3.87 3.87 - 5.11 MIL/uL   Hemoglobin 11.7 (L) 12.0 - 15.0 g/dL   HCT 35.5 (L) 36.0 - 46.0 %   MCV 91.7 78.0 - 100.0 fL   MCH 30.2 26.0 - 34.0 pg   MCHC 33.0 30.0 - 36.0 g/dL   RDW 12.7 11.5 - 15.5 %   Platelets 252 150 - 400 K/uL   Neutrophils Relative % 86 %   Neutro Abs 10.8 (H) 1.7 - 7.7 K/uL   Lymphocytes Relative 9 %   Lymphs Abs 1.1 0.7 - 4.0 K/uL   Monocytes Relative 5 %   Monocytes Absolute 0.7 0.1 - 1.0 K/uL   Eosinophils Relative 0 %   Eosinophils Absolute 0.1 0.0 - 0.7 K/uL   Basophils Relative 0 %   Basophils Absolute 0.0 0.0 - 0.1 K/uL    Comment: Performed at [facility], [address] [place], [place] [address]  Basic metabolic panel     Status: Abnormal   Collection Time: 11/29/17 12:14 PM  Result Value Ref Range  Sodium 136 135 - 145 mmol/L   Potassium 4.7 3.5 - 5.1 mmol/L   Chloride 103 101 - 111 mmol/L   CO2 23 22 - 32 mmol/L   Glucose, Bld 168 (H) 65 - 99 mg/dL   BUN 32 (H) 6 - 20 mg/dL   Creatinine, Ser 1.33 (H) 0.44 - 1.00 mg/dL   Calcium 9.0 8.9 - 10.3 mg/dL   GFR calc non Af Amer 36 (L) >60 mL/min   GFR calc Af Amer 42 (L) >60 mL/min    Comment: (NOTE) The eGFR has been calculated using the CKD EPI equation. This calculation has not been validated in all clinical situations. eGFR's persistently <60 mL/min signify possible Chronic Kidney Disease.    Anion gap 10 5 - 15    Comment: Performed at [facility], [address] [place], [place] [address]  Troponin I     Status: None   Collection Time: 11/29/17 12:14 PM  Result Value Ref Range   Troponin I <0.03 <0.03 ng/mL    Comment: Performed at  [facility], [address] [place], [place] [address]  Sedimentation rate     Status: Abnormal   Collection Time: 11/29/17 12:14 PM  Result Value Ref Range   Sed Rate 36 (H) 0 - 22 mm/hr    Comment: Performed at [facility], [address] [place], [place] [address]  C-reactive protein     Status: Abnormal   Collection Time: 11/29/17 12:14 PM  Result Value Ref Range   CRP 9.2 (H) <1.0 mg/dL    Comment: Performed at [facility], [place] [address], [place], [place] [address]  Urinalysis, Routine w reflex microscopic     Status: None   Collection Time: 11/29/17 12:52 PM  Result Value Ref Range   Color, Urine YELLOW YELLOW   APPearance CLEAR CLEAR   Specific Gravity, Urine 1.020 1.005 - 1.030   pH 6.0 5.0 - 8.0   Glucose, UA NEGATIVE NEGATIVE mg/dL   Hgb urine dipstick NEGATIVE NEGATIVE   Bilirubin Urine NEGATIVE NEGATIVE   Ketones, ur NEGATIVE NEGATIVE mg/dL   Protein, ur NEGATIVE NEGATIVE mg/dL   Nitrite NEGATIVE NEGATIVE   Leukocytes, UA NEGATIVE NEGATIVE    Comment: Microscopic not done on urines with negative protein, blood, leukocytes, nitrite, or glucose < 500 mg/dL. Performed at [facility], [address] [place], [place] [address]   Basic metabolic panel     Status: Abnormal   Collection Time: 11/30/17  5:30 AM  Result Value Ref Range   Sodium 142 135 - 145 mmol/L   Potassium 4.5 3.5 - 5.1 mmol/L   Chloride 109 101 - 111 mmol/L   CO2 25 22 - 32 mmol/L   Glucose, Bld 144 (H) 65 - 99 mg/dL   BUN 25 (H) 6 - 20 mg/dL   Creatinine, Ser 0.80 0.44 - 1.00 mg/dL   Calcium 9.3 8.9 - 10.3 mg/dL   GFR calc non Af Amer >60 >60 mL/min   GFR calc Af Amer >60 >60 mL/min    Comment: (NOTE) The eGFR has been calculated using the CKD EPI equation. This calculation has not been validated in all clinical situations. eGFR's persistently <60 mL/min signify possible Chronic Kidney Disease.    Anion gap 8 5 - 15    Comment: Performed at [facility], [place] [address], [place], [place] [address]  CBC     Status: Abnormal  Collection Time: 11/30/17  5:30 AM  Result Value Ref Range   WBC 8.3 4.0 - 10.5 K/uL   RBC 3.66 (L) 3.87 - 5.11 MIL/uL   Hemoglobin 11.0 (L) 12.0 - 15.0 g/dL   HCT 33.6 (L) 36.0 - 46.0 %   MCV 91.8 78.0 - 100.0 fL   MCH 30.1 26.0 - 34.0 pg   MCHC 32.7 30.0 - 36.0 g/dL   RDW 12.8 11.5 - 15.5 %   Platelets 246 150 - 400 K/uL    Comment: Performed at [facility], [place] [address], [place], [place] [address]  Magnesium     Status: None   Collection Time: 11/30/17  5:30 AM  Result Value Ref Range   Magnesium 2.1 1.7 - 2.4 mg/dL    Comment: Performed at [facility], [place] [address] [place], [place] [address]    Dg Pelvis 1-2 Views  Result Date: 11/29/2017 CLINICAL DATA:  RIGHT hip and femoral pain, fell 1 month ago EXAM: PELVIS - 1-2 VIEW COMPARISON:  10/09/2010 FINDINGS: Osseous demineralization. IM nail with proximal locking screws in RIGHT femur. Prior LEFT hip replacement. Advanced degenerative changes of the RIGHT hip joint with joint space narrowing, bone-on-bone appearance, minimal sclerosis/spur formation, and subchondral cyst formation. Additional degenerative changes at visualized lower lumbar spine and pubic symphysis. No definite acute fracture, dislocation, or bone destruction. IMPRESSION: Prior LEFT hip arthroplasty and RIGHT femoral ORIF. Osseous demineralization with advanced degenerative changes of RIGHT hip joint. Electronically Signed   By: Lavonia Dana M.D.   On: 11/29/2017 16:57   Dg Femur, Min 2 Views Right  Result Date: 11/29/2017 CLINICAL DATA:  RIGHT hip and femur pain post fall 1 month ago EXAM: RIGHT FEMUR 2 VIEWS COMPARISON:  07/02/2016 FINDINGS: IM nail with proximal and distal locking screws identified in the RIGHT femur across a healed oblique mid to distal RIGHT diaphyseal fracture. RIGHT knee prosthesis noted. Advanced degenerative  changes of RIGHT hip joint. Hardware appears intact without surrounding lucency. No acute fracture, dislocation, or bone destruction. IMPRESSION: Prior RIGHT femoral nailing with an old healed oblique mid to distal RIGHT femoral diaphyseal fracture. Advanced degenerative changes RIGHT hip joint. No acute abnormalities. Electronically Signed   By: Lavonia Dana M.D.   On: 11/29/2017 16:59    ROS  ROS: I have reviewed the patient's review of systems thoroughly and there are no positive responses as relates to the HPI. Blood pressure (!) 176/90, pulse 85, temperature 97.6 F (36.4 C), temperature source Oral, resp. rate 18, weight 70.1 kg (154 lb 9 oz), SpO2 96 %. Physical Exam Well-developed well-nourished patient in no acute distress. Alert and oriented x3 HEENT:within normal limits Cardiac: Regular rate and rhythm Pulmonary: Lungs clear to auscultation Abdomen: Soft and nontender.  Normal active bowel sounds  Musculoskeletal: Right hip: Limited pain with flexion and extension.  There is some pain with internal rotation in particular extremes of internal rotation.  There is no soft tissue swelling or erythema.  Right knee has well-healed wound.  There is no effusion.  There is no soft tissue swelling or erythema.  There is no instability.  She has mild straight leg raise positive on the right side. Assessment/Plan: 84 year old female with significant pain complaints who was admitted to the hospital because they were not able to get her pain under control in the emergency room yesterday.  We were consulted for evaluation and concerns of possibility of infection or other issues in her right lower extremity.//At this point  I am fairly confident that she does not have any infectious etiology for her pain.  In terms of her right lower extremity issues I think this could be some sciatica and I think continued treatment with steroids would be appropriate.  There are no restrictions to her sitting, bed  mobility, or walking.  When she is good enough to ambulate I think she would be fine to be discharged and follow-up in our office for consideration of removal of her right femoral rod and right hip replacement.  We will follow her while in hospital but do not think that we would do an operation on her at this point unless she is unable to mobilize and be discharged.  Alta Corning 11/30/2017, 8:26 AM

## 2017-12-01 DIAGNOSIS — N182 Chronic kidney disease, stage 2 (mild): Secondary | ICD-10-CM | POA: Diagnosis not present

## 2017-12-01 DIAGNOSIS — G894 Chronic pain syndrome: Secondary | ICD-10-CM | POA: Diagnosis not present

## 2017-12-01 DIAGNOSIS — F419 Anxiety disorder, unspecified: Secondary | ICD-10-CM | POA: Diagnosis not present

## 2017-12-01 DIAGNOSIS — M25551 Pain in right hip: Secondary | ICD-10-CM | POA: Diagnosis not present

## 2017-12-01 LAB — BASIC METABOLIC PANEL
ANION GAP: 9 (ref 5–15)
BUN: 23 mg/dL — ABNORMAL HIGH (ref 6–20)
CO2: 26 mmol/L (ref 22–32)
Calcium: 9.3 mg/dL (ref 8.9–10.3)
Chloride: 108 mmol/L (ref 101–111)
Creatinine, Ser: 0.83 mg/dL (ref 0.44–1.00)
GFR calc non Af Amer: >60 mL/min (ref >60)
Glucose, Bld: 113 mg/dL — ABNORMAL HIGH (ref 65–99)
POTASSIUM: 4 mmol/L (ref 3.5–5.1)
SODIUM: 143 mmol/L (ref 135–145)

## 2017-12-01 LAB — CBC
HCT: 33.3 % — ABNORMAL LOW (ref 36.0–46.0)
Hemoglobin: 10.7 g/dL — ABNORMAL LOW (ref 12.0–15.0)
MCH: 29.7 pg (ref 26.0–34.0)
MCHC: 32.1 g/dL (ref 30.0–36.0)
MCV: 92.5 fL (ref 78.0–100.0)
Platelets: 240 10*3/uL (ref 150–400)
RBC: 3.6 MIL/uL — ABNORMAL LOW (ref 3.87–5.11)
RDW: 13.1 % (ref 11.5–15.5)
WBC: 6.8 10*3/uL (ref 4.0–10.5)

## 2017-12-01 MED ORDER — GABAPENTIN 300 MG PO CAPS: 300.0000 mg | ORAL_CAPSULE | Freq: Three times a day (TID) | ORAL | 1 refills | Status: AC

## 2017-12-01 MED ORDER — MORPHINE SULFATE 15 MG PO TABS: 15.0000 mg | ORAL_TABLET | ORAL | 0 refills | Status: DC | PRN

## 2017-12-01 MED ORDER — PREDNISONE 20 MG PO TABS: 60.0000 mg | ORAL_TABLET | Freq: Every day | ORAL | 0 refills | Status: AC

## 2017-12-01 MED ORDER — ACETAMINOPHEN 500 MG PO TABS: 500.0000 mg | ORAL_TABLET | Freq: Three times a day (TID) | ORAL | 0 refills | Status: DC

## 2017-12-01 MED ORDER — ATENOLOL 50 MG PO TABS: 75.0000 mg | ORAL_TABLET | Freq: Every day | ORAL | 0 refills | Status: DC

## 2017-12-01 MED ORDER — FUROSEMIDE 40 MG PO TABS: 40.0000 mg | ORAL_TABLET | Freq: Every day | ORAL | Status: DC

## 2017-12-01 MED ORDER — CLONIDINE HCL 0.2 MG PO TABS: 0.2000 mg | ORAL_TABLET | Freq: Two times a day (BID) | ORAL | 1 refills | Status: DC

## 2017-12-01 NOTE — Care Management Note (Signed)
Case Management Note  Patient Details  Name: [name] MRN: [id number] Date of Birth: 01/18/1932  Subjective/Objective:      Falls              Action/Plan:  NCM spoke to pt Offered choice for HH/list provided. And states she had [facility] in the past. [facility] with new referral. She has RW and bedside commode at home. Lives at home with her grandsons.    Expected Discharge Date:  12/01/17               Expected Discharge Plan:  [place]  In-House Referral:  NA  Discharge planning Services  CM Consult  Post Acute Care Choice:  Home Health Choice offered to:  Patient  DME Arranged:  N/A DME Agency:  [facility], NA  HH Arranged:  PT [place] Agency:  [place]  Status of Service:  Completed, signed off  If discussed at [place] of Stay Meetings, dates discussed:    Additional Comments:  Erenest Rasher, RN 12/01/2017, 3:28 PM

## 2017-12-01 NOTE — Discharge Summary (Signed)
Physician Discharge Summary  [name] UEA:[id number] DOB: 1932/09/18 DOA: 11/29/2017  PCP: Curly Rim, MD  Admit date: 11/29/2017 Discharge date: 12/01/2017  Time spent: 50 minutes  Recommendations for Outpatient Follow-up:  1. Follow-up with Dr. Mayer Camel, orthopedics as scheduled. 2. Follow-up with Corrington, Kip A, MD in 2 weeks.  On follow-up patient's blood pressure need to be reassessed as patient's antihypertensive medications were adjusted during the hospitalization.   Discharge Diagnoses:  Principal Problem:   Right hip pain Active Problems:   HTN (hypertension)   CKD (chronic kidney disease) stage 2, GFR 60-89 ml/min   Chronic pain syndrome   Anxiety   Discharge Condition: Stable and improved.  Diet recommendation: Regular  Filed Weights   11/29/17 1150 11/29/17 2050  Weight: 77.1 kg (170 lb) 70.1 kg (154 lb 9 oz)    History of present illness:  Per Dr. Guadlupe Spanish [name] is a 84 y.o. female with history of hypertension, chronic kidney disease presented to the ER admits in [facility] with increasing pain in the right hip.  Patient stated he had been having chronic pain and is scheduled to follow-up with Dr. Mayer Camel, orthopedic surgeon for possible surgery.  Over the last 24 hours patient's pain acutely worsened with difficulty walking walking few steps.  Denied any fall.  ED Course: In the ER patient was given pain medications and steroids and x-rays done showed features consistent with right-sided osteoarthritis.  On-call orthopedic surgeon was notified and patient admitted for pain relief and further management per the time of exam patient was not in distress.  Her pain on moving right hip.    Hospital Course:  #1 right hip pain likely secondary to severe osteoarthritis Patient was admitted with increasing right hip pain.  Plain films obtained are consistent with a right sided osteoarthritis.  Patient was admitted for pain control.  Orthopedic  consultation was obtained.  Patient was assessed by orthopedics who felt patient did not have any infection at this time and in no need of surgical intervention.  Patient was subsequently placed on Tylenol 500 mg 3 times daily.  Patient's home dose of Neurontin was increased to 3 times daily.  MSIR was ordered for breakthrough pain as well as IV morphine for severe pain.  Patient improved clinically.  Prednisone 60 mg daily was started times 5 days.  Patient was seen by PT OT  who recommended home health.  Since pain was improved on this regimen.  Patient will be discharged in stable and improved condition and is to follow-up with PCP and orthopedics in the outpatient setting.    2.  Hypertension Patient maintained on home regimen of Norvasc.  Atenolol dose was increased to 75 mg daily and clonidine increased to 0.2 twice daily for better blood pressure control.  Patient's Lasix were held during the hospitalization will be resumed on discharge.  Outpatient follow-up.    3.  Chronic kidney disease stage II Stable.  4.  Chronic anemia Hemoglobin remained stable throughout the hospitalization.    5.  Anxiety Xanax as needed.  6.  Chronic pain syndrome Patient's Neurontin was increased to 3 times daily secondary to problem #1.  Patient was also placed on MSIR as needed secondary to problem #1.  Outpatient follow-up with PCP.      Procedures:  Plain films of the right femur 11/29/2017      Consultations:  Orthopedics: Dr. Berenice Primas 11/30/2017      Discharge Exam: Vitals:   12/01/17 1010 12/01/17  1313  BP:  (!) 152/55  Pulse:  70  Resp:  16  Temp:  97.8 F (36.6 C)  SpO2: 96% 98%    General: NAD Cardiovascular: RRR Respiratory: CTAB  Discharge Instructions   Discharge Instructions    Diet general   Complete by:  As directed    Increase activity slowly   Complete by:  As directed      Allergies as of 12/01/2017      Reactions   Codeine Nausea Only    Hydrochlorothiazide Other (See Comments)   unknown   Lipitor [atorvastatin Calcium] Other (See Comments)   unknown   Lisinopril    angioedema   Thiazide-type Diuretics Other (See Comments)   unknown   Zocor [simvastatin] Swelling      Medication List    STOP taking these medications   cyclobenzaprine 5 MG tablet Commonly known as:  FLEXERIL   HYDROcodone-acetaminophen 10-325 MG tablet Commonly known as:  NORCO   oxyCODONE-acetaminophen 5-325 MG tablet Commonly known as:  PERCOCET     TAKE these medications   acetaminophen 500 MG tablet Commonly known as:  TYLENOL Take 1 tablet (500 mg total) by mouth 3 (three) times daily.   albuterol 108 (90 Base) MCG/ACT inhaler Commonly known as:  PROVENTIL HFA;VENTOLIN HFA Inhale 2 puffs into the lungs every 6 (six) hours as needed. For shortness of breath   ALPRAZolam 0.5 MG tablet Commonly known as:  XANAX Take 0.5 mg by mouth at bedtime as needed. For anxiety   amLODipine 10 MG tablet Commonly known as:  NORVASC Take 10 mg by mouth daily.   atenolol 50 MG tablet Commonly known as:  TENORMIN Take 1.5 tablets (75 mg total) by mouth daily. What changed:    how much to take  Another medication with the same name was removed. Continue taking this medication, and follow the directions you see here.   BESIVANCE 0.6 % Susp Generic drug:  Besifloxacin HCl   CELEBREX 200 MG capsule Generic drug:  celecoxib Take 200 mg by mouth 2 (two) times daily.   cloNIDine 0.2 MG tablet Commonly known as:  CATAPRES Take 1 tablet (0.2 mg total) by mouth 2 (two) times daily. What changed:    medication strength  how much to take  Another medication with the same name was removed. Continue taking this medication, and follow the directions you see here.   DUREZOL 0.05 % Emul Generic drug:  Difluprednate   esomeprazole 40 MG capsule Commonly known as:  NEXIUM Take 40 mg by mouth daily before breakfast.   fluticasone 110 MCG/ACT  inhaler Commonly known as:  FLOVENT HFA Inhale 1 puff into the lungs 2 (two) times daily.   furosemide 40 MG tablet Commonly known as:  LASIX Take 1 tablet (40 mg total) by mouth daily. What changed:    medication strength  how much to take   gabapentin 300 MG capsule Commonly known as:  NEURONTIN Take 1 capsule (300 mg total) by mouth 3 (three) times daily. What changed:  when to take this   morphine 15 MG tablet Commonly known as:  MSIR Take 1 tablet (15 mg total) by mouth every 4 (four) hours as needed for moderate pain.   PATADAY 0.2 % Soln Generic drug:  Olopatadine HCl   predniSONE 20 MG tablet Commonly known as:  DELTASONE Take 3 tablets (60 mg total) by mouth daily before breakfast for 5 days. Start taking on:  12/02/2017 What changed:    medication strength  how much to take  when to take this   zolpidem 5 MG tablet Commonly known as:  AMBIEN      Allergies  Allergen Reactions  . Codeine Nausea Only  . Hydrochlorothiazide Other (See Comments)    unknown  . Lipitor [Atorvastatin Calcium] Other (See Comments)    unknown  . Lisinopril     angioedema  . Thiazide-Type Diuretics Other (See Comments)    unknown  . Zocor [Simvastatin] Swelling   Follow-up Information    Corrington, Kip A, MD. Schedule an appointment as soon as possible for a visit in 2 week(s).   Specialty:  Family Medicine Why:  For recheck of your symptoms Contact information: [place] [address] [facility] Follow up.   Specialty:  [place] Why:  Home Health Physical Therapy, aide and Social Worker-agency will call to arrange initial visit Contact information: [address] [place] [address] [phone]        Frederik Pear, MD Follow up.   Specialty:  Orthopedic Surgery Why:  f/u as scheduled. Contact information: [place] [address] [phone]             The results of significant diagnostics from this hospitalization (including imaging, microbiology, ancillary and laboratory) are listed below for reference.    Significant Diagnostic Studies: Dg Pelvis 1-2 Views  Result Date: 11/29/2017 CLINICAL DATA:  RIGHT hip and femoral pain, fell 1 month ago EXAM: PELVIS - 1-2 VIEW COMPARISON:  10/09/2010 FINDINGS: Osseous demineralization. IM nail with proximal locking screws in RIGHT femur. Prior LEFT hip replacement. Advanced degenerative changes of the RIGHT hip joint with joint space narrowing, bone-on-bone appearance, minimal sclerosis/spur formation, and subchondral cyst formation. Additional degenerative changes at visualized lower lumbar spine and pubic symphysis. No definite acute fracture, dislocation, or bone destruction. IMPRESSION: Prior LEFT hip arthroplasty and RIGHT femoral ORIF. Osseous demineralization with advanced degenerative changes of RIGHT hip joint. Electronically Signed   By: Lavonia Dana M.D.   On: 11/29/2017 16:57   Dg Femur, Min 2 Views Right  Result Date: 11/29/2017 CLINICAL DATA:  RIGHT hip and femur pain post fall 1 month ago EXAM: RIGHT FEMUR 2 VIEWS COMPARISON:  07/02/2016 FINDINGS: IM nail with proximal and distal locking screws identified in the RIGHT femur across a healed oblique mid to distal RIGHT diaphyseal fracture. RIGHT knee prosthesis noted. Advanced degenerative changes of RIGHT hip joint. Hardware appears intact without surrounding lucency. No acute fracture, dislocation, or bone destruction. IMPRESSION: Prior RIGHT femoral nailing with an old healed oblique mid to distal RIGHT femoral diaphyseal fracture. Advanced degenerative changes RIGHT hip joint. No acute abnormalities. Electronically Signed   By: Lavonia Dana M.D.   On: 11/29/2017 16:59    Microbiology: No results found for this or any previous visit (from the past 240 hour(s)).   Labs: Basic Metabolic Panel: Recent Labs  Lab 11/29/17 1214  11/30/17 0530 12/01/17 0545  NA 136 142 143  K 4.7 4.5 4.0  CL 103 109 108  CO2 23 25 26   GLUCOSE 168* 144* 113*  BUN 32* 25* 23*  CREATININE 1.33* 0.80 0.83  CALCIUM 9.0 9.3 9.3  MG  --  2.1  --    Liver Function Tests: No results for input(s): AST, ALT, ALKPHOS, BILITOT, PROT, ALBUMIN in the last 168 hours. No results for input(s): LIPASE, AMYLASE in the last 168 hours. No results for  input(s): AMMONIA in the last 168 hours. CBC: Recent Labs  Lab 11/29/17 1214 11/30/17 0530 12/01/17 0545  WBC 12.6* 8.3 6.8  NEUTROABS 10.8*  --   --   HGB 11.7* 11.0* 10.7*  HCT 35.5* 33.6* 33.3*  MCV 91.7 91.8 92.5  PLT 252 246 240   Cardiac Enzymes: Recent Labs  Lab 11/29/17 1214  TROPONINI <0.03   BNP: BNP (last 3 results) No results for input(s): BNP in the last 8760 hours.  ProBNP (last 3 results) No results for input(s): PROBNP in the last 8760 hours.  CBG: No results for input(s): GLUCAP in the last 168 hours.     Signed:  Irine Seal MD.  [facility] 12/01/2017, 4:27 PM

## 2017-12-01 NOTE — Care Management Obs Status (Signed)
[place] NOTIFICATION   Patient Details  Name: [name] MRN: [id number] Date of Birth: 01-Dec-1931   Medicare Observation Status Notification Given:  Yes    Erenest Rasher, RN 12/01/2017, 3:29 PM

## 2017-12-30 ENCOUNTER — Other Ambulatory Visit: Payer: Self-pay | Admitting: Orthopedic Surgery

## 2017-12-30 ENCOUNTER — Inpatient Hospital Stay (HOSPITAL_COMMUNITY)
Admission: RE | Admit: 2017-12-30 | Discharge: 2017-12-30 | Disposition: A | Discharge status: Home / Self Care | Payer: Medicare Other | Source: Ambulatory Visit

## 2018-01-08 NOTE — Pre-Procedure Instructions (Signed)
[name]  01/08/2018      [id number] [place], [place] - [address] [place] [address] Phone: [phone] Fax: [place] [facility] [place], [place] [address] Phone: [phone] Fax: [phone]    Your procedure is scheduled on Mon., January 20, 2018  Report to [facility] Admitting Entrance "A" at 7:45AM  Call this number if you have problems the morning of surgery:  [phone]   Remember:  Do not eat food or drink liquids after midnight.  Take these medicines the morning of surgery with A SIP OF WATER: Acetaminophen (TYLENOL), AmLODipine (NORVASC), Atenolol (TENORMIN), CloNIDine (CATAPRES), Esomeprazole (NEXIUM), Gabapentin (NEURONTIN), and Fluticasone (FLOVENT HFA). If needed Morphine (MSIR) for pain and Albuterol Inhaler for cough or wheezing (Bring with you the day of surgery).  7 days before surgery (4/22), stop taking all Aspirins, Vitamins, Fish oils, and Herbal medications. Also stop all NSAIDS i.e. Advil, Ibuprofen, Motrin, Aleve, Anaprox, Naproxen, BC and Goody Powders.  How to Manage Your Diabetes Before and After Surgery  Why is it important to control my blood sugar before and after surgery? . Improving blood sugar levels before and after surgery helps healing and can limit problems. . A way of improving blood sugar control is eating a healthy diet by: o  Eating less sugar and carbohydrates o  Increasing activity/exercise o  Talking with your doctor about reaching your blood sugar goals . High blood sugars (greater than 180 mg/dL) can raise your risk of infections and slow your recovery, so you will need to focus on controlling your diabetes during the weeks before surgery. . Make sure that the doctor who takes care of your diabetes knows about your planned surgery including the date and  location.  How do I manage my blood sugar before surgery? . Check your blood sugar at least 4 times a day, starting 2 days before surgery, to make sure that the level is not too high or low. o Check your blood sugar the morning of your surgery when you wake up and every 2 hours until you get to the Short Stay unit. . If your blood sugar is less than 70 mg/dL, you will need to treat for low blood sugar: o Do not take insulin. o Treat a low blood sugar (less than 70 mg/dL) with  cup of clear juice (cranberry or apple), 4 glucose tablets, OR glucose gel. Recheck blood sugar in 15 minutes after treatment (to make sure it is greater than 70 mg/dL). If your blood sugar is not greater than 70 mg/dL on recheck, call [phone] o  for further instructions. . Report your blood sugar to the short stay nurse when you get to Short Stay.  . If you are admitted to the hospital after surgery: o Your blood sugar will be checked by the staff and you will probably be given insulin after surgery (instead of oral diabetes medicines) to make sure you have good blood sugar levels. o The goal for blood sugar control after surgery is 80-180 mg/dL.  WHAT DO I DO ABOUT MY DIABETES MEDICATION?  Marland Kitchen Do not take oral diabetes medicines (pills) the morning of surgery.  . The day of surgery, do not take other diabetes injectables, including Byetta (exenatide), Bydureon (exenatide ER), Victoza (liraglutide), or Trulicity (dulaglutide).  . If your CBG is  greater than 220 mg/dL, call us at [phone]   Do not wear jewelry, make-up or nail polish.  Do not wear lotions, powders, perfumes, or deodorant.  Do not shave 48 hours prior to surgery.   Do not bring valuables to the hospital.  [facility] is not responsible for any belongings or valuables.  Contacts, dentures or bridgework may not be worn into surgery.  Leave your suitcase in the car.  After surgery it may be brought to your room.  For patients admitted to the  hospital, discharge time will be determined by your treatment team.  Patients discharged the day of surgery will not be allowed to drive home.   Special instructions:   [place]- Preparing For Surgery  Before surgery, you can play an important role. Because skin is not sterile, your skin needs to be as free of germs as possible. You can reduce the number of germs on your skin by washing with CHG (chlorahexidine gluconate) Soap before surgery.  CHG is an antiseptic cleaner which kills germs and bonds with the skin to continue killing germs even after washing.  Please do not use if you have an allergy to CHG or antibacterial soaps. If your skin becomes reddened/irritated stop using the CHG.  Do not shave (including legs and underarms) for at least 48 hours prior to first CHG shower. It is OK to shave your face.  Please follow these instructions carefully.   1. Shower the NIGHT BEFORE SURGERY and the MORNING OF SURGERY with CHG.   2. If you chose to wash your hair, wash your hair first as usual with your normal shampoo.  3. After you shampoo, rinse your hair and body thoroughly to remove the shampoo.  4. Use CHG as you would any other liquid soap. You can apply CHG directly to the skin and wash gently with a scrungie or a clean washcloth.   5. Apply the CHG Soap to your body ONLY FROM THE NECK DOWN.  Do not use on open wounds or open sores. Avoid contact with your eyes, ears, mouth and genitals (private parts). Wash Face and genitals (private parts)  with your normal soap.  6. Wash thoroughly, paying special attention to the area where your surgery will be performed.  7. Thoroughly rinse your body with warm water from the neck down.  8. DO NOT shower/wash with your normal soap after using and rinsing off the CHG Soap.  9. Pat yourself dry with a CLEAN TOWEL.  10. Wear CLEAN PAJAMAS to bed the night before surgery, wear comfortable clothes the morning of surgery  11. Place CLEAN  SHEETS on your bed the night of your first shower and DO NOT SLEEP WITH PETS.  Day of Surgery: Do not apply any deodorants/lotions. Please wear clean clothes to the hospital/surgery center.    Please read over the following fact sheets that you were given. Pain Booklet, Coughing and Deep Breathing, Total Joint Packet, MRSA Information and Surgical Site Infection Prevention

## 2018-01-09 ENCOUNTER — Encounter (HOSPITAL_COMMUNITY)
Admission: RE | Admit: 2018-01-09 | Discharge: 2018-01-09 | Disposition: A | Discharge status: Home / Self Care | Payer: Medicare Other | Source: Ambulatory Visit | Attending: Orthopedic Surgery | Admitting: Orthopedic Surgery

## 2018-01-09 ENCOUNTER — Other Ambulatory Visit: Payer: Self-pay

## 2018-01-09 ENCOUNTER — Ambulatory Visit (HOSPITAL_COMMUNITY)
Admission: RE | Admit: 2018-01-09 | Discharge: 2018-01-09 | Disposition: A | Discharge status: Home / Self Care | Payer: Medicare Other | Source: Ambulatory Visit | Attending: Orthopedic Surgery | Admitting: Orthopedic Surgery

## 2018-01-09 ENCOUNTER — Encounter (HOSPITAL_COMMUNITY): Payer: Self-pay

## 2018-01-09 DIAGNOSIS — Z01812 Encounter for preprocedural laboratory examination: Secondary | ICD-10-CM | POA: Diagnosis present

## 2018-01-09 DIAGNOSIS — Z01818 Encounter for other preprocedural examination: Secondary | ICD-10-CM | POA: Diagnosis present

## 2018-01-09 DIAGNOSIS — M1611 Unilateral primary osteoarthritis, right hip: Secondary | ICD-10-CM | POA: Diagnosis not present

## 2018-01-09 HISTORY — DX: Unspecified osteoarthritis, unspecified site: M19.90

## 2018-01-09 HISTORY — DX: Major depressive disorder, single episode, unspecified: F32.9

## 2018-01-09 HISTORY — DX: Anxiety disorder, unspecified: F41.9

## 2018-01-09 HISTORY — DX: Prediabetes: R73.03

## 2018-01-09 HISTORY — DX: Gastro-esophageal reflux disease without esophagitis: K21.9

## 2018-01-09 HISTORY — DX: Depression, unspecified: F32.A

## 2018-01-09 HISTORY — DX: Unspecified asthma, uncomplicated: J45.909

## 2018-01-09 HISTORY — DX: Malignant (primary) neoplasm, unspecified: C80.1

## 2018-01-09 LAB — TYPE AND SCREEN
ABO/RH(D): A POS
Antibody Screen: NEGATIVE

## 2018-01-09 LAB — BASIC METABOLIC PANEL
Anion gap: 9 (ref 5–15)
BUN: 28 mg/dL — AB (ref 6–20)
CO2: 22 mmol/L (ref 22–32)
CREATININE: 1.25 mg/dL — AB (ref 0.44–1.00)
Calcium: 9.2 mg/dL (ref 8.9–10.3)
Chloride: 107 mmol/L (ref 101–111)
GFR calc Af Amer: 44 mL/min — ABNORMAL LOW (ref >60)
GFR, EST NON AFRICAN AMERICAN: 38 mL/min — AB (ref >60)
GLUCOSE: 109 mg/dL — AB (ref 65–99)
Potassium: 5.6 mmol/L — ABNORMAL HIGH (ref 3.5–5.1)
SODIUM: 138 mmol/L (ref 135–145)

## 2018-01-09 LAB — URINALYSIS, ROUTINE W REFLEX MICROSCOPIC
Bilirubin Urine: NEGATIVE
Glucose, UA: NEGATIVE mg/dL
Hgb urine dipstick: NEGATIVE
Ketones, ur: NEGATIVE mg/dL
Leukocytes, UA: NEGATIVE
NITRITE: NEGATIVE
PH: 5 (ref 5.0–8.0)
Protein, ur: NEGATIVE mg/dL
SPECIFIC GRAVITY, URINE: 1.01 (ref 1.005–1.030)

## 2018-01-09 LAB — CBC WITH DIFFERENTIAL/PLATELET
BASOS ABS: 0 10*3/uL (ref 0.0–0.1)
Basophils Relative: 0 %
EOS ABS: 0.2 10*3/uL (ref 0.0–0.7)
EOS PCT: 2 %
HCT: 38.4 % (ref 36.0–46.0)
Hemoglobin: 12 g/dL (ref 12.0–15.0)
LYMPHS ABS: 2.4 10*3/uL (ref 0.7–4.0)
LYMPHS PCT: 24 %
MCH: 29.1 pg (ref 26.0–34.0)
MCHC: 31.3 g/dL (ref 30.0–36.0)
MCV: 93.2 fL (ref 78.0–100.0)
MONO ABS: 0.5 10*3/uL (ref 0.1–1.0)
Monocytes Relative: 5 %
Neutro Abs: 7 10*3/uL (ref 1.7–7.7)
Neutrophils Relative %: 69 %
PLATELETS: 233 10*3/uL (ref 150–400)
RBC: 4.12 MIL/uL (ref 3.87–5.11)
RDW: 14 % (ref 11.5–15.5)
WBC: 10 10*3/uL (ref 4.0–10.5)

## 2018-01-09 LAB — APTT: APTT: 25 s (ref 24–36)

## 2018-01-09 LAB — PROTIME-INR
INR: 0.94
PROTHROMBIN TIME: 12.4 s (ref 11.4–15.2)

## 2018-01-09 LAB — SURGICAL PCR SCREEN
MRSA, PCR: NEGATIVE
STAPHYLOCOCCUS AUREUS: NEGATIVE

## 2018-01-09 NOTE — Pre-Procedure Instructions (Signed)
[name]  01/09/2018      [id number] [place], [place] - [address] [place] [address] Phone: [phone] Fax: [place] [facility] [place], [place] [address] Phone: [phone] Fax: [phone]    Your procedure is scheduled on Mon., January 20, 2018  Report to [facility] Admitting Entrance "A" at 7:45AM  Call this number if you have problems the morning of surgery:  [phone]   Remember:  Do not eat food or drink liquids after midnight.  Take these medicines the morning of surgery with A SIP OF WATER: Acetaminophen (TYLENOL), AmLODipine (NORVASC), Atenolol (TENORMIN), CloNIDine (CATAPRES), Esomeprazole (NEXIUM), Gabapentin (NEURONTIN), and Fluticasone (FLOVENT HFA). If needed Morphine (MSIR) for pain and Albuterol Inhaler for cough or wheezing (Bring with you the day of surgery).  7 days before surgery (4/22), stop taking all Aspirins, Vitamins, Fish oils, and Herbal medications. Also stop all NSAIDS i.e. CELEBREX,  Advil, Ibuprofen, Motrin, Aleve, Anaprox, Naproxen, BC and Goody Powders.  How to Manage Your Diabetes Before and After Surgery  Why is it important to control my blood sugar before and after surgery? . Improving blood sugar levels before and after surgery helps healing and can limit problems. . A way of improving blood sugar control is eating a healthy diet by: o  Eating less sugar and carbohydrates o  Increasing activity/exercise o  Talking with your doctor about reaching your blood sugar goals . High blood sugars (greater than 180 mg/dL) can raise your risk of infections and slow your recovery, so you will need to focus on controlling your diabetes during the weeks before surgery. . Make sure that the doctor who takes care of your diabetes knows about your planned surgery including the date  and location.  How do I manage my blood sugar before surgery? . Check your blood sugar at least 4 times a day, starting 2 days before surgery, to make sure that the level is not too high or low. o Check your blood sugar the morning of your surgery when you wake up and every 2 hours until you get to the Short Stay unit. . If your blood sugar is less than 70 mg/dL, you will need to treat for low blood sugar: o Do not take insulin. o Treat a low blood sugar (less than 70 mg/dL) with  cup of clear juice (cranberry or apple), 4 glucose tablets, OR glucose gel. Recheck blood sugar in 15 minutes after treatment (to make sure it is greater than 70 mg/dL). If your blood sugar is not greater than 70 mg/dL on recheck, call [phone] o  for further instructions. . Report your blood sugar to the short stay nurse when you get to Short Stay.  . If you are admitted to the hospital after surgery: o Your blood sugar will be checked by the staff and you will probably be given insulin after surgery (instead of oral diabetes medicines) to make sure you have good blood sugar levels. o The goal for blood sugar control after surgery is 80-180 mg/dL.  WHAT DO I DO ABOUT MY DIABETES MEDICATION?  Marland Kitchen Do not take oral diabetes medicines (pills) the morning of surgery.  . The day of surgery, do not take other diabetes injectables, including Byetta (exenatide), Bydureon (exenatide ER), Victoza (liraglutide), or Trulicity (dulaglutide).  . If your  CBG is greater than 220 mg/dL, call us at [phone]   Do not wear jewelry, make-up or nail polish.  Do not wear lotions, powders, perfumes, or deodorant.  Do not shave 48 hours prior to surgery.   Do not bring valuables to the hospital.  [facility] is not responsible for any belongings or valuables.  Contacts, dentures or bridgework may not be worn into surgery.  Leave your suitcase in the car.  After surgery it may be brought to your room.  For patients admitted to  the hospital, discharge time will be determined by your treatment team.  Patients discharged the day of surgery will not be allowed to drive home.   Special instructions:   [place]- Preparing For Surgery  Before surgery, you can play an important role. Because skin is not sterile, your skin needs to be as free of germs as possible. You can reduce the number of germs on your skin by washing with CHG (chlorahexidine gluconate) Soap before surgery.  CHG is an antiseptic cleaner which kills germs and bonds with the skin to continue killing germs even after washing.  Please do not use if you have an allergy to CHG or antibacterial soaps. If your skin becomes reddened/irritated stop using the CHG.  Do not shave (including legs and underarms) for at least 48 hours prior to first CHG shower. It is OK to shave your face.  Please follow these instructions carefully.   1. Shower the NIGHT BEFORE SURGERY and the MORNING OF SURGERY with CHG.   2. If you chose to wash your hair, wash your hair first as usual with your normal shampoo.  3. After you shampoo, rinse your hair and body thoroughly to remove the shampoo.  4. Use CHG as you would any other liquid soap. You can apply CHG directly to the skin and wash gently with a scrungie or a clean washcloth.   5. Apply the CHG Soap to your body ONLY FROM THE NECK DOWN.  Do not use on open wounds or open sores. Avoid contact with your eyes, ears, mouth and genitals (private parts). Wash Face and genitals (private parts)  with your normal soap.  6. Wash thoroughly, paying special attention to the area where your surgery will be performed.  7. Thoroughly rinse your body with warm water from the neck down.  8. DO NOT shower/wash with your normal soap after using and rinsing off the CHG Soap.  9. Pat yourself dry with a CLEAN TOWEL.  10. Wear CLEAN PAJAMAS to bed the night before surgery, wear comfortable clothes the morning of surgery  11. Place CLEAN  SHEETS on your bed the night of your first shower and DO NOT SLEEP WITH PETS.  Day of Surgery: Do not apply any deodorants/lotions. Please wear clean clothes to the hospital/surgery center.    Please read over the following fact sheets that you were given. Pain Booklet, Coughing and Deep Breathing, Total Joint Packet, MRSA Information and Surgical Site Infection Prevention

## 2018-01-13 NOTE — Progress Notes (Signed)
Anesthesia Chart Review:   Case:  [id number] Date/Time:  01/20/18 0933   Procedure:  TOTAL HIP ARTHROPLASTY ANTERIOR APPROACH WITH REMOVAL OF IM NAIL (Right )   Anesthesia type:  Spinal   Pre-op diagnosis:  RIGHT HIP OSTEOARTHRITIS   Location:  [facility] OR ROOM 04 / [facility]   Surgeon:  Frederik Pear, MD      DISCUSSION: Pt is an 84 year old female.  Hospitalized 3/8-3/10/19 for hip pain/pain control.  K was normal during that inpatient stay.  K 5.6 at preadmission testing, which is consistent with prior results at PCPs office. I do not see a medication cause of hyperkalemia. Will recheck K day of surgery.    VS: BP (!) 155/54   Pulse 61   Temp 36.5 C   Resp 20   Ht 5\' 2"  (1.575 m)   Wt 171 lb (77.6 kg)   SpO2 98%   BMI 31.28 kg/m   PROVIDERS: Corrington, Kip A, MD   LABS: K 5.6. Prior results in care everywhere range 5.1-5.1 over last 3 years. During recent admission 3/8-3/10/19 K ranged 4.0-4.7. Will repeat day of surgery.   (all labs ordered are listed, but only abnormal results are displayed)  Labs Reviewed  BASIC METABOLIC PANEL - Abnormal; Notable for the following components:      Result Value   Potassium 5.6 (*)    Glucose, Bld 109 (*)    BUN 28 (*)    Creatinine, Ser 1.25 (*)    GFR calc non Af Amer 38 (*)    GFR calc Af Amer 44 (*)    All other components within normal limits  SURGICAL PCR SCREEN  APTT  CBC WITH DIFFERENTIAL/PLATELET  PROTIME-INR  URINALYSIS, ROUTINE W REFLEX MICROSCOPIC  TYPE AND SCREEN     IMAGES:  CXR 01/09/18: No active cardiopulmonary disease.   EKG 11/29/17: Sinus rhythm. Ventricular premature complex    Past Medical History:  Diagnosis Date  . Anxiety   . Arthritis   . Asthma   . Cancer ([place])    SKIN CA OF FACE  . Depression   . GERD (gastroesophageal reflux disease)   . Hypertension   . Pre-diabetes   . Prosthetic joint infection [facility])     Past Surgical History:  Procedure Laterality Date  .  ARM FRACTURE     LEFT  .  CHOLECYSTECTOMY    . IRRIGATION AND DEBRIDEMENT KNEE  09/12/2011   Procedure: IRRIGATION AND DEBRIDEMENT KNEE;  Surgeon: Kerin Salen;  Location: [facility];  Service: Orthopedics;  Laterality: Right;  I&D RIGHT TKA REVISE BEARINGS/MBT. Poly exchange  . REPLACEMENT TOTAL KNEE    . TOTAL HIP ARTHROPLASTY      MEDICATIONS: . acetaminophen (TYLENOL) 500 MG tablet  . albuterol (PROVENTIL HFA;VENTOLIN HFA) 108 (90 BASE) MCG/ACT inhaler  . ALPRAZolam (XANAX) 0.5 MG tablet  . amLODipine (NORVASC) 10 MG tablet  . atenolol (TENORMIN) 50 MG tablet  . BESIVANCE 0.6 % SUSP  . CELEBREX 200 MG capsule  . cloNIDine (CATAPRES) 0.2 MG tablet  . DUREZOL 0.05 % EMUL  . esomeprazole (NEXIUM) 40 MG capsule  . fluticasone (FLOVENT HFA) 110 MCG/ACT inhaler  . furosemide (LASIX) 40 MG tablet  . gabapentin (NEURONTIN) 300 MG capsule  . morphine (MSIR) 15 MG tablet  . PATADAY 0.2 % SOLN  . zolpidem (AMBIEN) 5 MG tablet   No current facility-administered medications for this encounter.     If labs acceptable day of surgery, I anticipate pt  can proceed with surgery as scheduled.  Willeen Cass, FNP-BC [facility] Short Stay Surgical Center/Anesthesiology Phone: [phone] 01/13/2018 12:18 PM

## 2018-01-17 MED ORDER — CEFAZOLIN SODIUM-DEXTROSE 2-4 GM/100ML-% IV SOLN
2.0000 g | INTRAVENOUS | Status: AC
  Administered 2018-01-20: 2 g via INTRAVENOUS
  Filled 2018-01-17: qty 100

## 2018-01-17 MED ORDER — TRANEXAMIC ACID 1000 MG/10ML IV SOLN
2000.0000 mg | INTRAVENOUS | Status: AC
  Administered 2018-01-20: 2000 mg via TOPICAL
  Filled 2018-01-17: qty 20

## 2018-01-18 DIAGNOSIS — M1711 Unilateral primary osteoarthritis, right knee: Secondary | ICD-10-CM | POA: Diagnosis present

## 2018-01-18 NOTE — H&P (Signed)
TOTAL HIP ADMISSION H&P  Patient is admitted for right total hip arthroplasty.  Subjective:  Chief Complaint: right hip pain  HPI: [name], 84 y.o. female, has a history of pain and functional disability in the right hip(s) due to trauma and arthritis and patient has failed non-surgical conservative treatments for greater than 12 weeks to include NSAID's and/or analgesics, corticosteriod injections, use of assistive devices, weight reduction as appropriate and activity modification.  Onset of symptoms was gradual starting 3 years ago with gradually worsening course since that time.The patient noted prior procedures of the hip to include femoral nail on the right hip(s).  Patient currently rates pain in the right hip at 10 out of 10 with activity. Patient has night pain, worsening of pain with activity and weight bearing, trendelenberg gait, pain that interfers with activities of daily living and pain with passive range of motion. Patient has evidence of subchondral sclerosis and joint space narrowing by imaging studies. This condition presents safety issues increasing the risk of falls. This patient has had mid femur fracture with IM nail.  There is no current active infection.  Patient Active Problem List   Diagnosis Date Noted  . Chronic pain syndrome   . Anxiety   . Right hip pain 11/29/2017  . CKD (chronic kidney disease) stage 2, GFR 60-89 ml/min 11/29/2017  . Prosthetic joint infection ([place])   . Renal failure 05/27/2012  . HTN (hypertension) 10/17/2011  . Infected prosthetic knee joint ([place]) 09/10/2011    Class: Acute   Past Medical History:  Diagnosis Date  . Anxiety   . Arthritis   . Asthma   . Cancer ([place])    SKIN CA OF FACE  . Depression   . GERD (gastroesophageal reflux disease)   . Hypertension   . Pre-diabetes   . Prosthetic joint infection [facility])     Past Surgical History:  Procedure Laterality Date  .  ARM FRACTURE     LEFT  . CHOLECYSTECTOMY    .  IRRIGATION AND DEBRIDEMENT KNEE  09/12/2011   Procedure: IRRIGATION AND DEBRIDEMENT KNEE;  Surgeon: Kerin Salen;  Location: [facility];  Service: Orthopedics;  Laterality: Right;  I&D RIGHT TKA REVISE BEARINGS/MBT. Poly exchange  . REPLACEMENT TOTAL KNEE    . TOTAL HIP ARTHROPLASTY      Current Facility-Administered Medications  Medication Dose Route Frequency Provider Last Rate Last Dose  . [START ON 01/20/2018] ceFAZolin (ANCEF) IVPB 2g/100 mL premix  2 g Intravenous To Joellyn Haff, MD      . Derrill Memo ON 01/20/2018] tranexamic acid (CYKLOKAPRON) 2,000 mg in sodium chloride 0.9 % 50 mL Topical Application  3,235 mg Topical To OR Frederik Pear, MD       Current Outpatient Medications  Medication Sig Dispense Refill Last Dose  . acetaminophen (TYLENOL) 500 MG tablet Take 1 tablet (500 mg total) by mouth 3 (three) times daily. 30 tablet 0   . albuterol (PROVENTIL HFA;VENTOLIN HFA) 108 (90 BASE) MCG/ACT inhaler Inhale 2 puffs into the lungs every 6 (six) hours as needed. For shortness of breath    Past Week at Unknown time  . ALPRAZolam (XANAX) 0.5 MG tablet Take 0.5 mg by mouth at bedtime as needed. For anxiety    11/29/2017 at Unknown time  . amLODipine (NORVASC) 10 MG tablet Take 10 mg by mouth daily.     11/29/2017 at Unknown time  . atenolol (TENORMIN) 50 MG tablet Take 1.5 tablets (75 mg total) by mouth daily.  60 tablet 0   . BESIVANCE 0.6 % SUSP    Not Taking at Unknown time  . CELEBREX 200 MG capsule Take 200 mg by mouth 2 (two) times daily.    11/29/2017 at Unknown time  . cloNIDine (CATAPRES) 0.2 MG tablet Take 1 tablet (0.2 mg total) by mouth 2 (two) times daily. 60 tablet 1   . DUREZOL 0.05 % EMUL    Not Taking at Unknown time  . esomeprazole (NEXIUM) 40 MG capsule Take 40 mg by mouth daily before breakfast.     11/29/2017 at Unknown time  . fluticasone (FLOVENT HFA) 110 MCG/ACT inhaler Inhale 1 puff into the lungs 2 (two) times daily.     11/29/2017 at Unknown time  . furosemide (LASIX) 40 MG  tablet Take 1 tablet (40 mg total) by mouth daily.     Marland Kitchen gabapentin (NEURONTIN) 300 MG capsule Take 1 capsule (300 mg total) by mouth 3 (three) times daily. 90 capsule 1   . morphine (MSIR) 15 MG tablet Take 1 tablet (15 mg total) by mouth every 4 (four) hours as needed for moderate pain. 30 tablet 0   . PATADAY 0.2 % SOLN    Not Taking at Unknown time  . zolpidem (AMBIEN) 5 MG tablet    Past Week at Unknown time   Allergies  Allergen Reactions  . Lisinopril Swelling    angioedema  . Hydrochlorothiazide Other (See Comments)    UNSPECIFIED REACTION   . Lipitor [Atorvastatin Calcium] Other (See Comments)    UNSPECIFIED REACTION   . Thiazide-Type Diuretics Other (See Comments)    UNSPECIFIED REACTION   . Zocor [Simvastatin] Swelling    SWELLING REACTION UNSPECIFIED   . Codeine Nausea Only    Social History   Tobacco Use  . Smoking status: Never Smoker  . Smokeless tobacco: Never Used  Substance Use Topics  . Alcohol use: No    Family History  Problem Relation Age of Onset  . Diabetes Mellitus II Daughter      Review of Systems  Constitutional: Positive for diaphoresis and malaise/fatigue.  HENT: Negative.   Respiratory: Positive for shortness of breath.   Cardiovascular: Positive for chest pain and leg swelling.       Htn  Gastrointestinal: Positive for abdominal pain, constipation, nausea and vomiting.       Poor appetite  Genitourinary: Negative.   Musculoskeletal: Positive for joint pain and myalgias.  Neurological: Positive for weakness.  Endo/Heme/Allergies: Bruises/bleeds easily.  Psychiatric/Behavioral: Positive for depression and memory loss.    Objective:  Physical Exam  Constitutional: She is oriented to person, place, and time. She appears well-developed and well-nourished.  HENT:  Head: Normocephalic and atraumatic.  Eyes: Pupils are equal, round, and reactive to light.  Neck: Normal range of motion. Neck supple.  Cardiovascular: Intact distal pulses.   Respiratory: Effort normal.  Musculoskeletal:  the patient has obvious irritability with any motion of the right hip.  Pain with palpation over the right groin.  Pain with he will bump.  Minimal pain in the right knee.  She has severe stiffness in the hip and can only internally rotate to proximally 5.  Her calves are soft and nontender.  She is neurovascularly intact distally.  Neurological: She is alert and oriented to person, place, and time.  Skin: Skin is warm and dry.  Psychiatric: She has a normal mood and affect. Her behavior is normal. Judgment and thought content normal.    Vital signs in last 24  hours:    Labs:   Estimated body mass index is 31.28 kg/m as calculated from the following:   Height as of 01/09/18: 5\' 2"  (1.575 m).   Weight as of 01/09/18: 77.6 kg (171 lb).   Imaging Review Plain radiographs demonstrate Multiple views of the right hip femur and knee are reviewed in office today.  This shows end-stage arthritis bone-on-bone with subchondral cyst formation of the right hip.  Patient does have a [employer] nail that has 2 proximal and 2 distal locking screws.  Preoperative templating of the joint replacement has been completed, documented, and submitted to the Operating Room personnel in order to optimize intra-operative equipment management.    Assessment/Plan:  End-stage arthritis right hip in a patient with prior history of femur fracture with IM nail  The patient history, physical examination, clinical judgement of the provider and imaging studies are consistent with end stage degenerative joint disease of the right hip(s) and total hip arthroplasty is deemed medically necessary. The treatment options including medical management, injection therapy, arthroscopy and arthroplasty were discussed at length. The risks and benefits of total hip arthroplasty were presented and reviewed. The risks due to aseptic loosening, infection, stiffness, dislocation/subluxation,   thromboembolic complications and other imponderables were discussed.  The patient acknowledged the explanation, agreed to proceed with the plan and consent was signed. Patient is being admitted for inpatient treatment for surgery, pain control, PT, OT, prophylactic antibiotics, VTE prophylaxis, progressive ambulation and ADL's and discharge planning.The patient is planning to be discharged home with home health services.

## 2018-01-20 ENCOUNTER — Inpatient Hospital Stay (HOSPITAL_COMMUNITY): Payer: Medicare Other

## 2018-01-20 ENCOUNTER — Inpatient Hospital Stay (HOSPITAL_COMMUNITY)
Admission: RE | Admit: 2018-01-20 | Discharge: 2018-01-22 | DRG: 470 | Disposition: A | Discharge status: Home Health | Payer: Medicare Other | Source: Ambulatory Visit | Attending: Orthopedic Surgery | Admitting: Orthopedic Surgery

## 2018-01-20 ENCOUNTER — Encounter (HOSPITAL_COMMUNITY): Payer: Self-pay | Admitting: *Deleted

## 2018-01-20 ENCOUNTER — Inpatient Hospital Stay (HOSPITAL_COMMUNITY): Payer: Medicare Other | Admitting: Vascular Surgery

## 2018-01-20 ENCOUNTER — Inpatient Hospital Stay (HOSPITAL_COMMUNITY): Payer: Medicare Other | Admitting: Emergency Medicine

## 2018-01-20 ENCOUNTER — Encounter (HOSPITAL_COMMUNITY)
Admission: RE | Disposition: A | Discharge status: Home Health | Payer: Self-pay | Source: Ambulatory Visit | Attending: Orthopedic Surgery

## 2018-01-20 DIAGNOSIS — Z96652 Presence of left artificial knee joint: Secondary | ICD-10-CM | POA: Diagnosis present

## 2018-01-20 DIAGNOSIS — D62 Acute posthemorrhagic anemia: Secondary | ICD-10-CM | POA: Diagnosis not present

## 2018-01-20 DIAGNOSIS — G894 Chronic pain syndrome: Secondary | ICD-10-CM | POA: Diagnosis present

## 2018-01-20 DIAGNOSIS — F329 Major depressive disorder, single episode, unspecified: Secondary | ICD-10-CM | POA: Diagnosis present

## 2018-01-20 DIAGNOSIS — Z85828 Personal history of other malignant neoplasm of skin: Secondary | ICD-10-CM | POA: Diagnosis not present

## 2018-01-20 DIAGNOSIS — N182 Chronic kidney disease, stage 2 (mild): Secondary | ICD-10-CM | POA: Diagnosis present

## 2018-01-20 DIAGNOSIS — I129 Hypertensive chronic kidney disease with stage 1 through stage 4 chronic kidney disease, or unspecified chronic kidney disease: Secondary | ICD-10-CM | POA: Diagnosis present

## 2018-01-20 DIAGNOSIS — M1611 Unilateral primary osteoarthritis, right hip: Principal | ICD-10-CM | POA: Diagnosis present

## 2018-01-20 DIAGNOSIS — Z79899 Other long term (current) drug therapy: Secondary | ICD-10-CM | POA: Diagnosis not present

## 2018-01-20 DIAGNOSIS — F419 Anxiety disorder, unspecified: Secondary | ICD-10-CM | POA: Diagnosis present

## 2018-01-20 DIAGNOSIS — K219 Gastro-esophageal reflux disease without esophagitis: Secondary | ICD-10-CM | POA: Diagnosis present

## 2018-01-20 DIAGNOSIS — R7303 Prediabetes: Secondary | ICD-10-CM | POA: Diagnosis present

## 2018-01-20 DIAGNOSIS — Z7982 Long term (current) use of aspirin: Secondary | ICD-10-CM

## 2018-01-20 DIAGNOSIS — M1711 Unilateral primary osteoarthritis, right knee: Secondary | ICD-10-CM

## 2018-01-20 DIAGNOSIS — J45909 Unspecified asthma, uncomplicated: Secondary | ICD-10-CM | POA: Diagnosis present

## 2018-01-20 DIAGNOSIS — Z96641 Presence of right artificial hip joint: Secondary | ICD-10-CM

## 2018-01-20 HISTORY — PX: TOTAL HIP ARTHROPLASTY: SHX124

## 2018-01-20 LAB — POCT I-STAT 4, (NA,K, GLUC, HGB,HCT)
GLUCOSE: 91 mg/dL (ref 65–99)
HCT: 34 % — ABNORMAL LOW (ref 36.0–46.0)
Hemoglobin: 11.6 g/dL — ABNORMAL LOW (ref 12.0–15.0)
POTASSIUM: 4.8 mmol/L (ref 3.5–5.1)
Sodium: 140 mmol/L (ref 135–145)

## 2018-01-20 SURGERY — ARTHROPLASTY, HIP, TOTAL,POSTERIOR APPROACH
Anesthesia: Spinal | Site: Hip | Laterality: Right

## 2018-01-20 MED ORDER — DIPHENHYDRAMINE HCL 12.5 MG/5ML PO ELIX: 12.5000 mg | ORAL_SOLUTION | ORAL | Status: DC | PRN

## 2018-01-20 MED ORDER — AMLODIPINE BESYLATE 10 MG PO TABS
10.0000 mg | ORAL_TABLET | Freq: Every day | ORAL | Status: DC
  Administered 2018-01-21: 10 mg via ORAL
  Filled 2018-01-20 (×2): qty 1

## 2018-01-20 MED ORDER — METOCLOPRAMIDE HCL 5 MG/ML IJ SOLN: 5.0000 mg | Freq: Three times a day (TID) | INTRAMUSCULAR | Status: DC | PRN

## 2018-01-20 MED ORDER — BUPIVACAINE-EPINEPHRINE 0.25% -1:200000 IJ SOLN
INTRAMUSCULAR | Status: AC
  Filled 2018-01-20: qty 1

## 2018-01-20 MED ORDER — HYDROCODONE-ACETAMINOPHEN 5-325 MG PO TABS
1.0000 | ORAL_TABLET | ORAL | Status: DC | PRN
  Administered 2018-01-21: 2 via ORAL
  Filled 2018-01-20: qty 2

## 2018-01-20 MED ORDER — ONDANSETRON HCL 4 MG/2ML IJ SOLN
INTRAMUSCULAR | Status: DC | PRN
  Administered 2018-01-20: 4 mg via INTRAVENOUS

## 2018-01-20 MED ORDER — TIZANIDINE HCL 2 MG PO TABS: 2.0000 mg | ORAL_TABLET | Freq: Four times a day (QID) | ORAL | 0 refills | Status: DC | PRN

## 2018-01-20 MED ORDER — METHOCARBAMOL 500 MG PO TABS
ORAL_TABLET | ORAL | Status: AC
  Filled 2018-01-20: qty 1

## 2018-01-20 MED ORDER — TRANEXAMIC ACID 1000 MG/10ML IV SOLN
1000.0000 mg | Freq: Once | INTRAVENOUS | Status: AC
  Administered 2018-01-20: 1000 mg via INTRAVENOUS
  Filled 2018-01-20: qty 10

## 2018-01-20 MED ORDER — MEPERIDINE HCL 50 MG/ML IJ SOLN: 6.2500 mg | INTRAMUSCULAR | Status: DC | PRN

## 2018-01-20 MED ORDER — METOCLOPRAMIDE HCL 5 MG/ML IJ SOLN: 10.0000 mg | Freq: Once | INTRAMUSCULAR | Status: DC | PRN

## 2018-01-20 MED ORDER — ASPIRIN EC 325 MG PO TBEC: 325.0000 mg | DELAYED_RELEASE_TABLET | Freq: Two times a day (BID) | ORAL | 0 refills | Status: DC

## 2018-01-20 MED ORDER — DOCUSATE SODIUM 100 MG PO CAPS
100.0000 mg | ORAL_CAPSULE | Freq: Two times a day (BID) | ORAL | Status: DC
  Administered 2018-01-20 – 2018-01-22 (×4): 100 mg via ORAL
  Filled 2018-01-20 (×4): qty 1

## 2018-01-20 MED ORDER — FENTANYL CITRATE (PF) 100 MCG/2ML IJ SOLN
25.0000 ug | INTRAMUSCULAR | Status: DC | PRN
  Administered 2018-01-20 (×2): 25 ug via INTRAVENOUS
  Administered 2018-01-20: 50 ug via INTRAVENOUS

## 2018-01-20 MED ORDER — BISACODYL 5 MG PO TBEC: 5.0000 mg | DELAYED_RELEASE_TABLET | Freq: Every day | ORAL | Status: DC | PRN

## 2018-01-20 MED ORDER — BUPIVACAINE LIPOSOME 1.3 % IJ SUSP
INTRAMUSCULAR | Status: DC | PRN
  Administered 2018-01-20: 20 mL

## 2018-01-20 MED ORDER — BUPIVACAINE-EPINEPHRINE (PF) 0.5% -1:200000 IJ SOLN
INTRAMUSCULAR | Status: DC | PRN
  Administered 2018-01-20: 50 mL

## 2018-01-20 MED ORDER — ALUM & MAG HYDROXIDE-SIMETH 200-200-20 MG/5ML PO SUSP: 30.0000 mL | ORAL | Status: DC | PRN

## 2018-01-20 MED ORDER — SODIUM CHLORIDE 0.9 % IR SOLN
Status: DC | PRN
  Administered 2018-01-20: 3000 mL

## 2018-01-20 MED ORDER — KCL IN DEXTROSE-NACL 20-5-0.45 MEQ/L-%-% IV SOLN
INTRAVENOUS | Status: DC
  Administered 2018-01-20: 17:00:00 via INTRAVENOUS
  Filled 2018-01-20: qty 1000

## 2018-01-20 MED ORDER — ALBUTEROL SULFATE (2.5 MG/3ML) 0.083% IN NEBU: 2.5000 mg | INHALATION_SOLUTION | Freq: Four times a day (QID) | RESPIRATORY_TRACT | Status: DC | PRN

## 2018-01-20 MED ORDER — BUDESONIDE 0.25 MG/2ML IN SUSP
0.2500 mg | Freq: Two times a day (BID) | RESPIRATORY_TRACT | Status: DC
  Administered 2018-01-20 – 2018-01-22 (×4): 0.25 mg via RESPIRATORY_TRACT
  Filled 2018-01-20 (×5): qty 2

## 2018-01-20 MED ORDER — BUPIVACAINE IN DEXTROSE 0.75-8.25 % IT SOLN
INTRATHECAL | Status: DC | PRN
  Administered 2018-01-20: 1.5 mL via INTRATHECAL

## 2018-01-20 MED ORDER — ACETAMINOPHEN 325 MG PO TABS
325.0000 mg | ORAL_TABLET | Freq: Four times a day (QID) | ORAL | Status: DC | PRN
  Administered 2018-01-22: 650 mg via ORAL
  Filled 2018-01-20: qty 2

## 2018-01-20 MED ORDER — MORPHINE SULFATE 15 MG PO TABS
15.0000 mg | ORAL_TABLET | ORAL | Status: DC | PRN
  Administered 2018-01-20 – 2018-01-21 (×2): 15 mg via ORAL
  Filled 2018-01-20 (×2): qty 1

## 2018-01-20 MED ORDER — LACTATED RINGERS IV SOLN
INTRAVENOUS | Status: DC
  Administered 2018-01-20 (×2): via INTRAVENOUS

## 2018-01-20 MED ORDER — MENTHOL 3 MG MT LOZG: 1.0000 | LOZENGE | OROMUCOSAL | Status: DC | PRN

## 2018-01-20 MED ORDER — ONDANSETRON HCL 4 MG PO TABS: 4.0000 mg | ORAL_TABLET | Freq: Four times a day (QID) | ORAL | Status: DC | PRN

## 2018-01-20 MED ORDER — FENTANYL CITRATE (PF) 100 MCG/2ML IJ SOLN
INTRAMUSCULAR | Status: AC
  Administered 2018-01-20: 50 ug via INTRAVENOUS
  Filled 2018-01-20: qty 2

## 2018-01-20 MED ORDER — METHOCARBAMOL 500 MG PO TABS
500.0000 mg | ORAL_TABLET | Freq: Four times a day (QID) | ORAL | Status: DC | PRN
  Administered 2018-01-20 – 2018-01-21 (×3): 500 mg via ORAL
  Filled 2018-01-20 (×2): qty 1

## 2018-01-20 MED ORDER — PANTOPRAZOLE SODIUM 40 MG PO TBEC
80.0000 mg | DELAYED_RELEASE_TABLET | Freq: Every day | ORAL | Status: DC
  Administered 2018-01-21 – 2018-01-22 (×2): 80 mg via ORAL
  Filled 2018-01-20 (×2): qty 2

## 2018-01-20 MED ORDER — ONDANSETRON HCL 4 MG/2ML IJ SOLN: 4.0000 mg | Freq: Four times a day (QID) | INTRAMUSCULAR | Status: DC | PRN

## 2018-01-20 MED ORDER — CHLORHEXIDINE GLUCONATE 4 % EX LIQD: 60.0000 mL | Freq: Once | CUTANEOUS | Status: DC

## 2018-01-20 MED ORDER — FUROSEMIDE 40 MG PO TABS
40.0000 mg | ORAL_TABLET | Freq: Every day | ORAL | Status: DC
  Administered 2018-01-20 – 2018-01-22 (×3): 40 mg via ORAL
  Filled 2018-01-20 (×3): qty 1

## 2018-01-20 MED ORDER — METHOCARBAMOL 1000 MG/10ML IJ SOLN
500.0000 mg | Freq: Four times a day (QID) | INTRAVENOUS | Status: DC | PRN
  Filled 2018-01-20: qty 5

## 2018-01-20 MED ORDER — ALPRAZOLAM 0.5 MG PO TABS
0.5000 mg | ORAL_TABLET | Freq: Every evening | ORAL | Status: DC | PRN
  Administered 2018-01-20: 0.5 mg via ORAL
  Filled 2018-01-20: qty 1

## 2018-01-20 MED ORDER — SODIUM CHLORIDE 0.9 % IJ SOLN
INTRAMUSCULAR | Status: DC | PRN
Start: 2018-01-20 — End: 2018-01-20
  Administered 2018-01-20: 50 mL

## 2018-01-20 MED ORDER — ACETAMINOPHEN 500 MG PO TABS
500.0000 mg | ORAL_TABLET | Freq: Four times a day (QID) | ORAL | Status: AC
Start: 2018-01-20 — End: 2018-01-21
  Administered 2018-01-20 – 2018-01-21 (×2): 500 mg via ORAL
  Filled 2018-01-20 (×5): qty 1

## 2018-01-20 MED ORDER — MORPHINE SULFATE (PF) 2 MG/ML IV SOLN
0.5000 mg | INTRAVENOUS | Status: DC | PRN
  Administered 2018-01-20: 1 mg via INTRAVENOUS
  Filled 2018-01-20: qty 1

## 2018-01-20 MED ORDER — FLEET ENEMA 7-19 GM/118ML RE ENEM: 1.0000 | ENEMA | Freq: Once | RECTAL | Status: DC | PRN

## 2018-01-20 MED ORDER — PROPOFOL 500 MG/50ML IV EMUL
INTRAVENOUS | Status: DC | PRN
  Administered 2018-01-20: 50 ug/kg/min via INTRAVENOUS

## 2018-01-20 MED ORDER — LACTATED RINGERS IV SOLN: INTRAVENOUS | Status: DC

## 2018-01-20 MED ORDER — CLONIDINE HCL 0.2 MG PO TABS
0.2000 mg | ORAL_TABLET | Freq: Two times a day (BID) | ORAL | Status: DC
  Administered 2018-01-20 – 2018-01-21 (×3): 0.2 mg via ORAL
  Filled 2018-01-20 (×4): qty 1

## 2018-01-20 MED ORDER — PHENOL 1.4 % MT LIQD: 1.0000 | OROMUCOSAL | Status: DC | PRN

## 2018-01-20 MED ORDER — HYDROCODONE-ACETAMINOPHEN 7.5-325 MG PO TABS
1.0000 | ORAL_TABLET | ORAL | Status: DC | PRN
  Administered 2018-01-20 – 2018-01-22 (×6): 2 via ORAL
  Filled 2018-01-20 (×5): qty 2

## 2018-01-20 MED ORDER — DEXAMETHASONE SODIUM PHOSPHATE 10 MG/ML IJ SOLN
10.0000 mg | Freq: Once | INTRAMUSCULAR | Status: AC
  Administered 2018-01-21: 10 mg via INTRAVENOUS
  Filled 2018-01-20: qty 1

## 2018-01-20 MED ORDER — POLYETHYLENE GLYCOL 3350 17 G PO PACK: 17.0000 g | PACK | Freq: Every day | ORAL | Status: DC | PRN

## 2018-01-20 MED ORDER — 0.9 % SODIUM CHLORIDE (POUR BTL) OPTIME
TOPICAL | Status: DC | PRN
  Administered 2018-01-20: 1000 mL

## 2018-01-20 MED ORDER — HYDROCODONE-ACETAMINOPHEN 7.5-325 MG PO TABS
ORAL_TABLET | ORAL | Status: AC
  Filled 2018-01-20: qty 2

## 2018-01-20 MED ORDER — CELECOXIB 200 MG PO CAPS
200.0000 mg | ORAL_CAPSULE | Freq: Two times a day (BID) | ORAL | Status: DC
  Administered 2018-01-20 – 2018-01-22 (×4): 200 mg via ORAL
  Filled 2018-01-20 (×4): qty 1

## 2018-01-20 MED ORDER — GABAPENTIN 300 MG PO CAPS
300.0000 mg | ORAL_CAPSULE | Freq: Three times a day (TID) | ORAL | Status: DC
  Administered 2018-01-20 – 2018-01-22 (×7): 300 mg via ORAL
  Filled 2018-01-20 (×7): qty 1

## 2018-01-20 MED ORDER — ATENOLOL 50 MG PO TABS
75.0000 mg | ORAL_TABLET | Freq: Every day | ORAL | Status: DC
  Administered 2018-01-21: 75 mg via ORAL
  Filled 2018-01-20 (×2): qty 1

## 2018-01-20 MED ORDER — ASPIRIN EC 325 MG PO TBEC
325.0000 mg | DELAYED_RELEASE_TABLET | Freq: Every day | ORAL | Status: DC
  Administered 2018-01-21 – 2018-01-22 (×2): 325 mg via ORAL
  Filled 2018-01-20 (×2): qty 1

## 2018-01-20 MED ORDER — TRANEXAMIC ACID 1000 MG/10ML IV SOLN
1000.0000 mg | INTRAVENOUS | Status: AC
  Administered 2018-01-20: 1000 mg via INTRAVENOUS
  Filled 2018-01-20: qty 1100

## 2018-01-20 MED ORDER — METOCLOPRAMIDE HCL 5 MG PO TABS: 5.0000 mg | ORAL_TABLET | Freq: Three times a day (TID) | ORAL | Status: DC | PRN

## 2018-01-20 MED ORDER — BUPIVACAINE LIPOSOME 1.3 % IJ SUSP
20.0000 mL | Freq: Once | INTRAMUSCULAR | Status: DC
  Filled 2018-01-20: qty 20

## 2018-01-20 SURGICAL SUPPLY — 46 items
BAG DECANTER FOR FLEXI CONT (MISCELLANEOUS) ×3 IMPLANT
BLADE SAW SGTL 18X1.27X75 (BLADE) ×3 IMPLANT
CAPT HIP TOTAL 2 ×2 IMPLANT
COVER SURGICAL LIGHT HANDLE (MISCELLANEOUS) ×3 IMPLANT
DRAPE C-ARM 42X72 X-RAY (DRAPES) ×3 IMPLANT
DRAPE ORTHO SPLIT 77X108 STRL (DRAPES) ×6
DRAPE SURG ORHT 6 SPLT 77X108 (DRAPES) ×2 IMPLANT
DRAPE U-SHAPE 47X51 STRL (DRAPES) ×6 IMPLANT
DRSG AQUACEL AG ADV 3.5X 4 (GAUZE/BANDAGES/DRESSINGS) ×2 IMPLANT
DRSG AQUACEL AG ADV 3.5X10 (GAUZE/BANDAGES/DRESSINGS) ×3 IMPLANT
DURAPREP 26ML APPLICATOR (WOUND CARE) ×3 IMPLANT
ELECT BLADE 4.0 EZ CLEAN MEGAD (MISCELLANEOUS) ×3
ELECT REM PT RETURN 9FT ADLT (ELECTROSURGICAL) ×3
ELECTRODE BLDE 4.0 EZ CLN MEGD (MISCELLANEOUS) ×2 IMPLANT
ELECTRODE REM PT RTRN 9FT ADLT (ELECTROSURGICAL) ×2 IMPLANT
GLOVE BIO SURGEON STRL SZ7.5 (GLOVE) ×5 IMPLANT
GLOVE BIO SURGEON STRL SZ8.5 (GLOVE) ×3 IMPLANT
GLOVE BIOGEL PI IND STRL 8 (GLOVE) ×3 IMPLANT
GLOVE BIOGEL PI IND STRL 9 (GLOVE) ×2 IMPLANT
GLOVE BIOGEL PI INDICATOR 8 (GLOVE) ×2
GLOVE BIOGEL PI INDICATOR 9 (GLOVE) ×1
GOWN STRL REUS W/ TWL LRG LVL3 (GOWN DISPOSABLE) ×2 IMPLANT
GOWN STRL REUS W/ TWL XL LVL3 (GOWN DISPOSABLE) ×4 IMPLANT
GOWN STRL REUS W/TWL LRG LVL3 (GOWN DISPOSABLE) ×3
GOWN STRL REUS W/TWL XL LVL3 (GOWN DISPOSABLE) ×6
GUIDEWIRE GAMMA (WIRE) ×2 IMPLANT
HANDPIECE INTERPULSE COAX TIP (DISPOSABLE) ×3
HOOD PEEL AWAY FACE SHEILD DIS (HOOD) ×2 IMPLANT
HOOD PEEL AWAY FLYTE STAYCOOL (MISCELLANEOUS) ×2 IMPLANT
KIT BASIN OR (CUSTOM PROCEDURE TRAY) ×3 IMPLANT
KIT TURNOVER KIT B (KITS) ×3 IMPLANT
MANIFOLD NEPTUNE II (INSTRUMENTS) ×3 IMPLANT
NEEDLE HYPO 22GX1.5 SAFETY (NEEDLE) ×6 IMPLANT
NS IRRIG 1000ML POUR BTL (IV SOLUTION) ×3 IMPLANT
PACK TOTAL JOINT (CUSTOM PROCEDURE TRAY) ×3 IMPLANT
PAD ARMBOARD 7.5X6 YLW CONV (MISCELLANEOUS) ×6 IMPLANT
PASSER SUT SWANSON 36MM LOOP (INSTRUMENTS) ×2 IMPLANT
SET HNDPC FAN SPRY TIP SCT (DISPOSABLE) ×1 IMPLANT
SUCTION FRAZIER HANDLE 10FR (MISCELLANEOUS) ×1
SUCTION TUBE FRAZIER 10FR DISP (MISCELLANEOUS) ×1 IMPLANT
SUT ETHIBOND 2 V 37 (SUTURE) ×2 IMPLANT
SUT VIC AB 3-0 CT1 27 (SUTURE) ×3
SUT VIC AB 3-0 CT1 TAPERPNT 27 (SUTURE) ×1 IMPLANT
SYR CONTROL 10ML LL (SYRINGE) ×6 IMPLANT
TOWEL OR 17X26 10 PK STRL BLUE (TOWEL DISPOSABLE) ×3 IMPLANT
TRAY CATH 16FR W/PLASTIC CATH (SET/KITS/TRAYS/PACK) IMPLANT

## 2018-01-20 NOTE — Discharge Instructions (Signed)

## 2018-01-20 NOTE — Evaluation (Signed)
Physical Therapy Evaluation Patient Details Name: [name] MRN: [id number] DOB: [birth year] Today's Date: 01/20/2018   History of Present Illness  84 Y.O. Female with Hx of anxiety, HTN, and CKD. S/p right total hip arthroplasty via posterior approach.  Clinical Impression  Patient is s/p right total hip replacement (posterior approach) resulting in functional limitations due to the deficits listed below (see PT Problem List). Educated on precautions, exercises and progression of plan of care while under acute hospital care. Pt plans to go home with family support and will have to navigate several steps to enter her home with a rail on the left hand side. Tolerated ambulation post op day #0 up to 75 feet with min guard assist for safety and min assist for bed and transfer mobility. Anticipate quick progression. Patient will benefit from skilled PT to increase their independence and safety with mobility to allow discharge to the venue listed below.       Follow Up Recommendations Follow surgeon's recommendation for DC plan and follow-up therapies;Supervision for mobility/OOB    Equipment Recommendations  Rolling walker with 5" wheels;3in1 (PT)    Recommendations for Other Services       Precautions / Restrictions Precautions Precautions: Posterior Hip Precaution Booklet Issued: Yes (comment) Precaution Comments: Educated, reviewed Restrictions Weight Bearing Restrictions: Yes RLE Weight Bearing: Weight bearing as tolerated      Mobility  Bed Mobility Overal bed mobility: Needs Assistance Bed Mobility: Supine to Sit     Supine to sit: Min assist;HOB elevated     General bed mobility comments: Min assist for guidance with RLE and to maintain precautions. Good UE strength to straighten herself out and scoot to EOB.  Transfers Overall transfer level: Needs assistance Equipment used: Rolling walker (2 wheeled) Transfers: Sit to/from Stand Sit to Stand: Min assist          General transfer comment: Min assist for boost from bed. Cues to prevent forward flexion of hips past 90 degrees. VC for hand placement and technique.  Ambulation/Gait Ambulation/Gait assistance: Min guard Ambulation Distance (Feet): 75 Feet Assistive device: Rolling walker (2 wheeled) Gait Pattern/deviations: Step-to pattern;Decreased step length - left;Decreased stance time - right;Decreased weight shift to right;Antalgic;Narrow base of support Gait velocity: decreased Gait velocity interpretation: <1.31 ft/sec, indicative of household ambulator General Gait Details: Moderatly antalgic gait pattern without buckling. Educated on safe DME use and sequencing with walker. Min guard for safety throughout ambulatory distance. LLE internally rotated  Stairs            Wheelchair Mobility    Modified Rankin (Stroke Patients Only)       Balance Overall balance assessment: No apparent balance deficits (not formally assessed)                                           Pertinent Vitals/Pain Pain Assessment: 0-10 Pain Score: 10-Worst pain ever Pain Location: Rt hip Pain Descriptors / Indicators: Aching Pain Intervention(s): Monitored during session;Premedicated before session;Limited activity within patient's tolerance;Repositioned    Home Living Family/patient expects to be discharged to:: Private residence Living Arrangements: Other relatives;Children Available Help at Discharge: Family;Available 24 hours/day Type of Home: Mobile home Home Access: Stairs to enter Entrance Stairs-Rails: Left Entrance Stairs-Number of Steps: 4 Home Layout: One level Home Equipment: None(Reports old walker is falling apart.)      Prior Function Level of Independence: Independent with  assistive device(s)         Comments: Using walker for mobility.     Hand Dominance   Dominant Hand: Right    Extremity/Trunk Assessment   Upper Extremity Assessment Upper  Extremity Assessment: Defer to OT evaluation    Lower Extremity Assessment Lower Extremity Assessment: RLE deficits/detail RLE Deficits / Details: Limited assessment due to hip precautions however grossly weak and guarded RLE: Unable to fully assess due to immobilization;Unable to fully assess due to pain RLE Sensation: WNL RLE Coordination: WNL    Cervical / Trunk Assessment Cervical / Trunk Assessment: Normal  Communication   Communication: No difficulties  Cognition Arousal/Alertness: Awake/alert Behavior During Therapy: WFL for tasks assessed/performed Overall Cognitive Status: Within Functional Limits for tasks assessed                                        General Comments General comments (skin integrity, edema, etc.): Educated pt and family on precautions and positioning    Exercises Total Joint Exercises Ankle Circles/Pumps: AROM;Both;20 reps;Seated Long Arc Quad: Strengthening;Right;10 reps;Seated   Assessment/Plan    PT Assessment Patient needs continued PT services  PT Problem List Decreased strength;Decreased range of motion;Decreased activity tolerance;Decreased balance;Decreased mobility;Decreased knowledge of use of DME;Decreased knowledge of precautions;Pain       PT Treatment Interventions DME instruction;Gait training;Stair training;Functional mobility training;Therapeutic activities;Therapeutic exercise;Balance training;Neuromuscular re-education;Patient/family education;Modalities    PT Goals (Current goals can be found in the Care Plan section)  Acute Rehab PT Goals Patient Stated Goal: Go home soon PT Goal Formulation: With patient Time For Goal Achievement: 01/27/18 Potential to Achieve Goals: Good    Frequency 7X/week   Barriers to discharge        Co-evaluation               AM-PAC PT "6 Clicks" Daily Activity  Outcome Measure Difficulty turning over in bed (including adjusting bedclothes, sheets and blankets)?: A  Little Difficulty moving from lying on back to sitting on the side of the bed? : A Little Difficulty sitting down on and standing up from a chair with arms (e.g., wheelchair, bedside commode, etc,.)?: A Little Help needed moving to and from a bed to chair (including a wheelchair)?: A Little Help needed walking in hospital room?: A Little Help needed climbing 3-5 steps with a railing? : A Lot 6 Click Score: 17    End of Session Equipment Utilized During Treatment: Gait belt Activity Tolerance: Patient tolerated treatment well Patient left: in chair;with call bell/phone within reach;with chair alarm set;with family/visitor present;with SCD's reapplied Nurse Communication: Mobility status;Precautions PT Visit Diagnosis: Difficulty in walking, not elsewhere classified (R26.2);Other abnormalities of gait and mobility (R26.89)    Time: 1610-9604 PT Time Calculation (min) (ACUTE ONLY): 38 min   Charges:   PT Evaluation $PT Eval Moderate Complexity: 1 Mod PT Treatments $Gait Training: 8-22 mins $Therapeutic Activity: 8-22 mins   PT G Codes:        Elayne Snare, PT, DPT  Ellouise Newer 01/20/2018, 6:20 PM

## 2018-01-20 NOTE — Anesthesia Preprocedure Evaluation (Signed)
Anesthesia Evaluation  Patient identified by MRN, date of birth, ID band Patient awake    Reviewed: Allergy & Precautions, NPO status , Patient's Chart, lab work & pertinent test results  Airway Mallampati: II  TM Distance: >3 FB Neck ROM: Full    Dental no notable dental hx. (+) Edentulous Upper, Edentulous Lower   Pulmonary    Pulmonary exam normal breath sounds clear to auscultation       Cardiovascular hypertension, Pt. on medications negative cardio ROS Normal cardiovascular exam Rhythm:Regular Rate:Normal     Neuro/Psych Anxiety Depression negative neurological ROS     GI/Hepatic negative GI ROS, Neg liver ROS, GERD  Controlled,  Endo/Other  negative endocrine ROS  Renal/GU negative Renal ROS  negative genitourinary   Musculoskeletal negative musculoskeletal ROS (+)   Abdominal   Peds negative pediatric ROS (+)  Hematology negative hematology ROS (+)   Anesthesia Other Findings   Reproductive/Obstetrics negative OB ROS                             Anesthesia Physical Anesthesia Plan  ASA: II  Anesthesia Plan: Spinal   Post-op Pain Management:    Induction:   PONV Risk Score and Plan: 2 and Ondansetron  Airway Management Planned: Simple Face Mask  Additional Equipment:   Intra-op Plan:   Post-operative Plan:   Informed Consent: I have reviewed the patients History and Physical, chart, labs and discussed the procedure including the risks, benefits and alternatives for the proposed anesthesia with the patient or authorized representative who has indicated his/her understanding and acceptance.   Dental advisory given  Plan Discussed with: CRNA  Anesthesia Plan Comments: (NPO: coffee with breakfast mix at 0530.  SAB)        Anesthesia Quick Evaluation

## 2018-01-20 NOTE — Anesthesia Postprocedure Evaluation (Signed)
Anesthesia Post Note  Patient: [name]  Procedure(s) Performed: TOTAL HIP ARTHROPLASTY WITH REMOVAL OF INTRAMEDULLARY NAIL (Right Hip)     Patient location during evaluation: PACU Anesthesia Type: Spinal Level of consciousness: awake and alert Pain management: pain level controlled Vital Signs Assessment: post-procedure vital signs reviewed and stable Respiratory status: spontaneous breathing, nonlabored ventilation, respiratory function stable and patient connected to nasal cannula oxygen Cardiovascular status: blood pressure returned to baseline and stable Postop Assessment: no apparent nausea or vomiting and spinal receding Anesthetic complications: no    Last Vitals:  Vitals:   01/20/18 1410 01/20/18 1415  BP: (!) 189/55   Pulse: 67 66  Resp: 13 14  Temp:    SpO2: 100% 100%    Last Pain:  Vitals:   01/20/18 1415  TempSrc:   PainSc: 10-Worst pain ever                 Montez Hageman

## 2018-01-20 NOTE — Anesthesia Procedure Notes (Signed)
Spinal  Patient location during procedure: OR Staffing Anesthesiologist: , , MD Performed: anesthesiologist  Preanesthetic Checklist Completed: patient identified, site marked, surgical consent, pre-op evaluation, timeout performed, IV checked, risks and benefits discussed and monitors and equipment checked Spinal Block Patient position: sitting Prep: Betadine Patient monitoring: heart rate, continuous pulse ox and blood pressure Approach: right paramedian Location: L3-4 Injection technique: single-shot Needle Needle type: Spinocan  Needle gauge: 22 G Needle length: 9 cm Additional Notes Expiration date of kit checked and confirmed. Patient tolerated procedure well, without complications.       

## 2018-01-20 NOTE — Op Note (Signed)
PATIENT ID:      [name]  MRN:     [id number] DOB/AGE:    Apr 26, 1932 / 84 y.o.       OPERATIVE REPORT    DATE OF PROCEDURE:  01/20/2018       PREOPERATIVE DIAGNOSIS:  RIGHT HIP OSTEOARTHRITIS                                                       Estimated body mass index is 31.28 kg/m as calculated from the following:   Height as of this encounter: 5\' 2"  (1.575 m).   Weight as of this encounter: 171 lb (77.6 kg).     POSTOPERATIVE DIAGNOSIS:  RIGHT HIP OSTEOARTHRITIS                                                           PROCEDURE: Removal of Stryker T2 femoral nail and locking screws, R total hip arthroplasty using a 52 mm DePuy Pinnacle  Cup, [employer], 0-degree polyethylene liner index superior  and posterior, a +0 36 mm ceramic head, a [id number] SROM Stem, 18Dsm Sleeve  SURGEON: Kerin Salen    ASSISTANT:   Kerry Hough. Barton Dubois  (present throughout entire procedure and necessary for timely completion of the procedure)  ANESTHESIA: Spinal  BLOOD LOSS: 400cc FLUID REPLACEMENT: 1600 crystalloid Tranexamic Acid: 1gm iv 2gm topical DRAINS: None COMPLICATIONS: None    INDICATIONS FOR PROCEDURE:Patient with end-stage arthritis of the R hip.  X-rays show bone-on-bone arthritic changes, peri chondral cyts.  There is also retained antegrade piriformis femoral nail placed 10 years ago by Dr. Ginette Pitman Stryker T2 10 mm x 36 cm with top and bottom locking screws.  Despite conservative measures with observation, anti-inflammatory medicine, narcotics, use of a cane, has severe unremitting pain and can ambulate only 1 blocks before resting.  Patient desires elective right total hip arthroplasty to decrease pain and increase function. The risks, benefits, and alternatives were discussed at length including but not limited to the risks of infection, bleeding, nerve injury, stiffness, blood clots, the need for revision surgery, cardiopulmonary complications, among others, and  they were willing to proceed.Benefits have been discussed. Questions answered.     PROCEDURE IN DETAIL: The patient was identified by armband,  received preoperative IV antibiotics in the holding area at [facility], taken to the operating room , appropriate anesthetic monitors  were attached and general endotracheal anesthesia induced. Foley catheter was inserted. Patient was rolled into the L lateral decubitus position and fixed there with a Stulberg Mark II pelvic clamp and the R lower extremity was then prepped and draped  in the usual sterile fashion from the ankle to the hemipelvis. A time-out  procedure was performed. The skin along the lateral hip and thigh  infiltrated with 10 mL of 0.5% Marcaine and epinephrine solution.  We began the procedure by re-creating the distal stab wounds for the distal locking screws which were then palpated with the tip of a hemostat and removed with a Stryker T2 femoral nail locking screwdriver.  We  then made a posterolateral approach to the hip. With  a #10 blade, 15 cm  incision through skin and subcutaneous tissue down to the level of the  IT band. Small bleeders were identified and cauterized. IT band cut in  line with skin incision exposing the greater trochanter. A Cobra retractor was placed between the gluteus minimus and the superior hip joint capsule, and a spiked Cobra between the quadratus femoris and the inferior hip joint capsule. This isolated the short  external rotators and piriformis tendons. These were tagged with a #2 Ethibond  suture and cut off their insertion on the intertrochanteric crest. The posterior  capsule was then developed into an acetabular-based flap from Posterior Superior off of the acetabulum out over the femoral neck and back posterior inferior to the acetabular rim. This flap was tagged with two #2 Ethibond sutures and retracted protecting the sciatic nerve. This exposed the arthritic femoral head and osteophytes.  The hip was then flexed and internally rotated, dislocating the femoral head and a standard neck cut performed 1 fingerbreadth above the lesser trochanter.  A spiked Cobra was placed in the cotyloid notch and a Hohmann retractor was then used to lever the femur anteriorly off of the anterior pelvic column. A posterior-inferior wing retractor was placed at the junction of the acetabulum and the ischium completing the acetabular exposure.We then removed the peripheral osteophytes and labrum from the acetabulum. We then reamed the acetabulum up to 53 mm with basket reamers obtaining good coverage in all quadrants, irrigated out with normal  saline solution and hammered into place a 54 mm pinnacle cup in 45  degrees of abduction and about 20 degrees of anteversion. More  peripheral osteophytes removed, the apex hole eliminator was placed, and a 0-degree liner placed . The hip was then flexed and internally rotated exposing the  proximal femur, which was entered with the box cutting chisel, the initiating reamer followed by axial reaming up to 13.16mm full depth, and 14 partial depth. We then conically reamed up to 18D. And milled the calcar to 18Dsm. We then placed the trial sleeve, and a trial stem. A trial reduction was then  performed with a +0  36-mm ball on the standard neck and  excellent stability was noted with at 90 of flexion with 75 of  internal rotation and then full extension withexternal rotation. The hip  could not be dislocated in full extension. The knee could easily flex  to about 140 degrees. We also stretched the abductors at this point,  because of the preexisting adductor contractures. All trial components  were then removed. The real sleeve was then hammered into place, through the sleeve we reamed with a 13.5 reamer to ensure that the stem did not become incarcerated. The stem itself was then inserted in 10deg in relation to the calcar.  At this point, a + 0 36-mm ceramic head was   hammered on the stem. The hip was reduced. We checked our stability  one more time and found to be excellent. The wound was once again  thoroughly irrigated out with normal saline solution pulse lavage. The  capsular flap and short external rotators were repaired back to the  intertrochanteric crest through drill holes with a #2 Ethibond suture.  The IT band was closed with running 1 Vicryl suture. The subcutaneous  tissue with 0 and 2-0 undyed Vicryl suture and the skin with running  3-0 Vivryl SQ suture. Aquacil dessing was applied. The patient was then unclamped, rolled supine, awaken extubated and taken to recovery room  without difficulty in stable condition.   Kerin Salen 01/20/2018, 11:38 AM

## 2018-01-20 NOTE — Plan of Care (Signed)
Physical Therapy goals established for safe discharge to home setting.

## 2018-01-20 NOTE — Transfer of Care (Signed)
Immediate Anesthesia Transfer of Care Note  Patient: [name]  Procedure(s) Performed: TOTAL HIP ARTHROPLASTY WITH REMOVAL OF INTRAMEDULLARY NAIL (Right Hip)  Patient Location: PACU  Anesthesia Type:MAC and Spinal  Level of Consciousness: patient cooperative and responds to stimulation  Airway & Oxygen Therapy: Patient Spontanous Breathing  Post-op Assessment: Report given to RN and Post -op Vital signs reviewed and stable  Post vital signs: Reviewed and stable  Last Vitals:  Vitals Value Taken Time  BP 164/56 01/20/2018 12:17 PM  Temp    Pulse 51 01/20/2018 12:17 PM  Resp 14 01/20/2018 12:17 PM  SpO2 99 % 01/20/2018 12:17 PM  Vitals shown include unvalidated device data.  Last Pain:  Vitals:   01/20/18 0750  TempSrc: Oral  PainSc:       Patients Stated Pain Goal: 3 (71/06/26 9485)  Complications: No apparent anesthesia complications

## 2018-01-20 NOTE — Interval H&P Note (Signed)
History and Physical Interval Note:  01/20/2018 7:27 AM  [name]  has presented today for surgery, with the diagnosis of RIGHT HIP OSTEOARTHRITIS  The various methods of treatment have been discussed with the patient and family. After consideration of risks, benefits and other options for treatment, the patient has consented to  Procedure(s): TOTAL HIP ARTHROPLASTY ANTERIOR APPROACH WITH REMOVAL OF IM NAIL (Right) as a surgical intervention .  The patient's history has been reviewed, patient examined, no change in status, stable for surgery.  I have reviewed the patient's chart and labs.  Questions were answered to the patient's satisfaction.     Kerin Salen

## 2018-01-21 ENCOUNTER — Encounter (HOSPITAL_COMMUNITY): Payer: Self-pay | Admitting: Orthopedic Surgery

## 2018-01-21 LAB — BASIC METABOLIC PANEL
Anion gap: 7 (ref 5–15)
BUN: 22 mg/dL — ABNORMAL HIGH (ref 6–20)
CALCIUM: 8.9 mg/dL (ref 8.9–10.3)
CO2: 29 mmol/L (ref 22–32)
Chloride: 105 mmol/L (ref 101–111)
Creatinine, Ser: 1.23 mg/dL — ABNORMAL HIGH (ref 0.44–1.00)
GFR calc Af Amer: 45 mL/min — ABNORMAL LOW (ref >60)
GFR, EST NON AFRICAN AMERICAN: 39 mL/min — AB (ref >60)
Glucose, Bld: 110 mg/dL — ABNORMAL HIGH (ref 65–99)
POTASSIUM: 5.3 mmol/L — AB (ref 3.5–5.1)
SODIUM: 141 mmol/L (ref 135–145)

## 2018-01-21 LAB — CBC
HCT: 31.8 % — ABNORMAL LOW (ref 36.0–46.0)
Hemoglobin: 10.3 g/dL — ABNORMAL LOW (ref 12.0–15.0)
MCH: 29.9 pg (ref 26.0–34.0)
MCHC: 32.4 g/dL (ref 30.0–36.0)
MCV: 92.4 fL (ref 78.0–100.0)
PLATELETS: 163 10*3/uL (ref 150–400)
RBC: 3.44 MIL/uL — AB (ref 3.87–5.11)
RDW: 14.1 % (ref 11.5–15.5)
WBC: 5.4 10*3/uL (ref 4.0–10.5)

## 2018-01-21 MED ORDER — DEXTROSE-NACL 5-0.45 % IV SOLN
INTRAVENOUS | Status: DC
  Administered 2018-01-21: 14:00:00 via INTRAVENOUS

## 2018-01-21 NOTE — Progress Notes (Signed)
Physical Therapy Treatment Patient Details Name: [name] MRN: [id number] DOB: [birth year] Today's Date: 01/21/2018    History of Present Illness 84 Y.O. Female with Hx of anxiety, HTN, and CKD. S/p right total hip arthroplasty via posterior approach.    PT Comments    Pt is a bit sore this AM, but able to progress gait down the hallway.  HEP and hip precaution education reinforced.  Pt is planning on going home with her son.  PT will continue to follow acutely.    Follow Up Recommendations  Follow surgeon's recommendation for DC plan and follow-up therapies;Supervision for mobility/OOB     Equipment Recommendations  Rolling walker with 5" wheels;3in1 (PT)    Recommendations for Other Services   NA     Precautions / Restrictions Precautions Precautions: Posterior Hip Precaution Booklet Issued: Yes (comment) Precaution Comments: Educated, reviewed, handout given Restrictions Weight Bearing Restrictions: Yes RLE Weight Bearing: Weight bearing as tolerated    Mobility  Bed Mobility Overal bed mobility: Needs Assistance Bed Mobility: Supine to Sit     Supine to sit: Min assist     General bed mobility comments: Min assist to help progress right leg over EOB.   Transfers Overall transfer level: Needs assistance Equipment used: Rolling walker (2 wheeled) Transfers: Sit to/from Stand Sit to Stand: Min assist         General transfer comment: Min assist to support trunk and stabilize RW during transitions.  Verbal cues for safe hand placement.   Ambulation/Gait Ambulation/Gait assistance: Min guard Ambulation Distance (Feet): 130 Feet Assistive device: Rolling walker (2 wheeled) Gait Pattern/deviations: Step-through pattern;Antalgic     General Gait Details: Pt with moderately antalgic gait pattern, verbal cues for safe RW use and upright posture.          Balance Overall balance assessment: Needs assistance Sitting-balance support: Feet supported;No  upper extremity supported Sitting balance-Leahy Scale: Good     Standing balance support: Bilateral upper extremity supported Standing balance-Leahy Scale: Fair                              Cognition Arousal/Alertness: Awake/alert Behavior During Therapy: WFL for tasks assessed/performed Overall Cognitive Status: Within Functional Limits for tasks assessed                                        Exercises Total Joint Exercises Ankle Circles/Pumps: AROM;Both;20 reps Quad Sets: AROM;Right;10 reps Heel Slides: AAROM;Right;10 reps Hip ABduction/ADduction: AAROM;Right;10 reps        Pertinent Vitals/Pain Pain Assessment: 0-10 Pain Score: 8  Pain Location: Rt hip Pain Descriptors / Indicators: Aching Pain Intervention(s): Limited activity within patient's tolerance;Monitored during session;Repositioned;Patient requesting pain meds-RN notified           PT Goals (current goals can now be found in the care plan section) Acute Rehab PT Goals Patient Stated Goal: Go home soon Progress towards PT goals: Progressing toward goals    Frequency    7X/week      PT Plan Current plan remains appropriate       AM-PAC PT "6 Clicks" Daily Activity  Outcome Measure  Difficulty turning over in bed (including adjusting bedclothes, sheets and blankets)?: A Little Difficulty moving from lying on back to sitting on the side of the bed? : A Little Difficulty sitting down on and standing up  from a chair with arms (e.g., wheelchair, bedside commode, etc,.)?: A Little Help needed moving to and from a bed to chair (including a wheelchair)?: A Little Help needed walking in hospital room?: A Little Help needed climbing 3-5 steps with a railing? : A Little 6 Click Score: 18    End of Session   Activity Tolerance: Patient limited by pain Patient left: in chair;with call bell/phone within reach;with chair alarm set Nurse Communication: Patient requests pain  meds PT Visit Diagnosis: Difficulty in walking, not elsewhere classified (R26.2);Other abnormalities of gait and mobility (R26.89)     Time: 1040-1059 PT Time Calculation (min) (ACUTE ONLY): 19 min  Charges:  $Gait Training: 8-22 mins        Fadel Clason B. Alonzo Loving, PT, DPT [phone]         01/21/2018, 11:35 AM

## 2018-01-21 NOTE — Progress Notes (Signed)
Physical Therapy Treatment Patient Details Name: [name] MRN: [id number] DOB: 06-10-1932 Today's Date: 01/21/2018    History of Present Illness 84 y.o. female with Hx of anxiety, HTN, and CKD. S/p right total hip arthroplasty via posterior approach.    PT Comments    Pt is POD #1 and this is her second session.  She was able to practice stairs simulating home entry, walk into the hallway with RW, and further review her HEP.  She will need to practice stairs and standing exercises in AM prior to d/c.  Follow Up Recommendations  Follow surgeon's recommendation for DC plan and follow-up therapies;Supervision for mobility/OOB     Equipment Recommendations  Rolling walker with 5" wheels;3in1 (PT)    Recommendations for Other Services   NA     Precautions / Restrictions Precautions Precautions: Posterior Hip Precaution Booklet Issued: Yes (comment) Precaution Comments: Educated, reviewed, handout given Restrictions RLE Weight Bearing: Weight bearing as tolerated    Mobility   Transfers Overall transfer level: Needs assistance Equipment used: Rolling walker (2 wheeled) Transfers: Sit to/from Stand Sit to Stand: Min guard         General transfer comment: Min guard assist for safety and balance during transitions.   Ambulation/Gait Ambulation/Gait assistance: Min guard Ambulation Distance (Feet): 130 Feet Assistive device: Rolling walker (2 wheeled) Gait Pattern/deviations: Step-through pattern;Antalgic     General Gait Details: Pt with moderately antalgic gait pattern, verbal cues for safe RW use and upright posture. Better speed and mildly less antalgia during PM session.    Stairs Stairs: Yes Stairs assistance: Supervision Stair Management: Two rails;Step to pattern;Forwards Number of Stairs: 5(x2) General stair comments: Pt able to demonstrate stairs and reinforced correct LE sequencing.           Balance Overall balance assessment: Needs  assistance Sitting-balance support: Feet supported;No upper extremity supported Sitting balance-Leahy Scale: Good     Standing balance support: Bilateral upper extremity supported Standing balance-Leahy Scale: Fair                              Cognition Arousal/Alertness: Awake/alert Behavior During Therapy: WFL for tasks assessed/performed Overall Cognitive Status: Within Functional Limits for tasks assessed                                        Exercises Total Joint Exercises Ankle Circles/Pumps: AROM;Both;20 reps  Short Arc Quad: AROM;Right;10 reps  Hip ABduction/ADduction: AAROM;Right;10 reps Long Arc Quad: AROM;Right;10 reps        Pertinent Vitals/Pain Pain Assessment: 0-10 Pain Score: 6  Pain Location: Rt hip Pain Descriptors / Indicators: Aching Pain Intervention(s): Limited activity within patient's tolerance;Monitored during session;Repositioned;Premedicated before session           PT Goals (current goals can now be found in the care plan section) Acute Rehab PT Goals Patient Stated Goal: Go home soon Progress towards PT goals: Progressing toward goals    Frequency    7X/week      PT Plan Current plan remains appropriate       AM-PAC PT "6 Clicks" Daily Activity  Outcome Measure  Difficulty turning over in bed (including adjusting bedclothes, sheets and blankets)?: A Little Difficulty moving from lying on back to sitting on the side of the bed? : A Little Difficulty sitting down on and standing up from a  chair with arms (e.g., wheelchair, bedside commode, etc,.)?: A Little Help needed moving to and from a bed to chair (including a wheelchair)?: A Little Help needed walking in hospital room?: A Little Help needed climbing 3-5 steps with a railing? : A Little 6 Click Score: 18    End of Session   Activity Tolerance: Patient limited by pain Patient left: in chair;with call bell/phone within reach;with chair alarm  set Nurse Communication: Patient requests pain meds PT Visit Diagnosis: Difficulty in walking, not elsewhere classified (R26.2);Other abnormalities of gait and mobility (R26.89)     Time: 6122-4497 PT Time Calculation (min) (ACUTE ONLY): 19 min  Charges:  $Gait Training: 8-22 mins          Cali Hope B. [place], [place], DPT [phone]            01/21/2018, 2:58 PM

## 2018-01-21 NOTE — Progress Notes (Signed)
PATIENT ID: [name]  MRN: [id number]  DOB/AGE:  1932/09/17 / 84 y.o.  1 Day Post-Op Procedure(s) (LRB): TOTAL HIP ARTHROPLASTY WITH REMOVAL OF INTRAMEDULLARY NAIL (Right)    PROGRESS NOTE Subjective: Patient is alert, oriented, no Nausea, no Vomiting, yes passing gas, . Taking PO well. Denies SOB, Chest or Calf Pain. Using Incentive Spirometer, PAS in place. Ambulate WBAT, 27' Patient reports pain as  2/10  .    Objective: Vital signs in last 24 hours: Vitals:   01/20/18 2009 01/20/18 2100 01/21/18 0053 01/21/18 0536  BP:  (Abnormal) 169/58 (Abnormal) 171/54 (Abnormal) 156/51  Pulse:  73 69 70  Resp:  18 16 18   Temp:  98.3 F (36.8 C) 97.9 F (36.6 C) 98.4 F (36.9 C)  TempSrc:  Oral Oral Oral  SpO2: 97% 97% 98% 94%  Weight:      Height:          Intake/Output from previous day: I/O last 3 completed shifts: In: 1420.8 [P.O.:240; I.V.:1070.8; IV Piggyback:110] Out: 1600 [Urine:1600]   Intake/Output this shift: No intake/output data recorded.   LABORATORY DATA: Recent Labs    01/20/18 0735 01/21/18 0248  WBC  --  5.4  HGB 11.6* 10.3*  HCT 34.0* 31.8*  PLT  --  163  NA 140 141  K 4.8 5.3*  CL  --  105  CO2  --  29  BUN  --  22*  CREATININE  --  1.23*  GLUCOSE 91 110*  CALCIUM  --  8.9    Examination: Neurologically intact ABD soft Neurovascular intact Sensation intact distally Intact pulses distally Dorsiflexion/Plantar flexion intact Incision: dressing C/D/I No cellulitis present Compartment soft} XR AP&Lat of hip shows well placed\fixed THA  Assessment:   1 Day Post-Op Procedure(s) (LRB): TOTAL HIP ARTHROPLASTY WITH REMOVAL OF INTRAMEDULLARY NAIL (Right) ADDITIONAL DIAGNOSIS:  Expected Acute Blood Loss Anemia, Renal Insufficiency Chronic , HTN Plan: PT/OT WBAT, THA, post hip precautions  DVT Prophylaxis: SCDx72 hrs, ASA 325 mg BID x 2 weeks  DISCHARGE PLAN: Home, when passes PT  DISCHARGE NEEDS: HHPT, Walker and 3-in-1 comode seat

## 2018-01-22 ENCOUNTER — Other Ambulatory Visit: Payer: Self-pay

## 2018-01-22 ENCOUNTER — Encounter (HOSPITAL_COMMUNITY): Payer: Self-pay | Admitting: General Practice

## 2018-01-22 LAB — CBC
HEMATOCRIT: 29.9 % — AB (ref 36.0–46.0)
Hemoglobin: 9.6 g/dL — ABNORMAL LOW (ref 12.0–15.0)
MCH: 29.1 pg (ref 26.0–34.0)
MCHC: 32.1 g/dL (ref 30.0–36.0)
MCV: 90.6 fL (ref 78.0–100.0)
Platelets: 170 10*3/uL (ref 150–400)
RBC: 3.3 MIL/uL — ABNORMAL LOW (ref 3.87–5.11)
RDW: 13.8 % (ref 11.5–15.5)
WBC: 8 10*3/uL (ref 4.0–10.5)

## 2018-01-22 NOTE — Progress Notes (Signed)
Discharge papers given to patient with full understanding. IV  D/C Son picked up patient

## 2018-01-22 NOTE — Care Management Note (Addendum)
Case Management Note  Patient Details  Name: [name] MRN: [id number] Date of Birth: 02/26/1932  Subjective/Objective:      Right THA             Action/Plan: NCM spoke to pt and son at bedside. States she has HH with [facility] in the past.  RW and bedside commode delivered to room.  Expected Discharge Date:  01/22/18               Expected Discharge Plan:  [place]  In-House Referral:  NA  Discharge planning Services  CM Consult  Post Acute Care Choice:  Home Health Choice offered to:  Patient  DME Arranged:  Walker rolling DME Agency:  [employer]  HH Arranged:  PT HH Agency:  [facility] [employer]  Status of Service:  Completed, signed off  If discussed at [employer], dates discussed:    Additional Comments:  Erenest Rasher, RN 01/22/2018, 3:35 PM

## 2018-01-22 NOTE — Progress Notes (Signed)
Physical Therapy Treatment Patient Details Name: [name] MRN: [id number] DOB: April 13, 1932 Today's Date: 01/22/2018    History of Present Illness 84 y.o. female with Hx of anxiety, HTN, and CKD. S/p right total hip arthroplasty via posterior approach.    PT Comments    Pt is POD #2 and is moving well, min guard assist to supervision overall.  She was able to practice stairs again and preform her standing exercises.  She is physically ready for d/c home.  PT to follow acutely until d/c confirmed.      Follow Up Recommendations  Follow surgeon's recommendation for DC plan and follow-up therapies;Supervision for mobility/OOB     Equipment Recommendations  Rolling walker with 5" wheels;3in1 (PT)    Recommendations for Other Services   NA     Precautions / Restrictions Precautions Precautions: Posterior Hip Precaution Booklet Issued: Yes (comment) Precaution Comments: Educated, reviewed, handout given.  Functionally pt needs reminders that she cannot reach down to her feet in sitting.  Restrictions Weight Bearing Restrictions: Yes RLE Weight Bearing: Weight bearing as tolerated    Mobility  Bed Mobility Overal bed mobility: Modified Independent                Transfers Overall transfer level: Needs assistance Equipment used: Rolling walker (2 wheeled) Transfers: Sit to/from Stand Sit to Stand: Min guard         General transfer comment: Min guard assist for safety during transitions.   Ambulation/Gait Ambulation/Gait assistance: Min guard Ambulation Distance (Feet): 130 Feet Assistive device: Rolling walker (2 wheeled) Gait Pattern/deviations: Step-through pattern;Antalgic     General Gait Details: Minimally antalgic gait pattern, verbal cues for upright posture,    Stairs Stairs: Yes Stairs assistance: Supervision Stair Management: Two rails;Step to pattern;Forwards Number of Stairs: 5(x2) General stair comments: Pt able to go up and down with both  feet (as she forgets the correct sequencing) without difficulty.           Balance Overall balance assessment: Needs assistance Sitting-balance support: Feet supported;No upper extremity supported Sitting balance-Leahy Scale: Good     Standing balance support: Bilateral upper extremity supported;No upper extremity supported;Single extremity supported Standing balance-Leahy Scale: Fair                              Cognition Arousal/Alertness: Awake/alert Behavior During Therapy: WFL for tasks assessed/performed Overall Cognitive Status: Within Functional Limits for tasks assessed                                        Exercises Total Joint Exercises Hip ABduction/ADduction: AROM;Right;10 reps;Standing Knee Flexion: AROM;Right;10 reps;Standing Marching in Standing: AROM;Right;10 reps;Standing Standing Hip Extension: AROM;Right;10 reps;Standing        Pertinent Vitals/Pain Pain Assessment: Faces Faces Pain Scale: Hurts little more Pain Location: Rt hip Pain Descriptors / Indicators: Grimacing;Guarding Pain Intervention(s): Limited activity within patient's tolerance;Monitored during session;Repositioned           PT Goals (current goals can now be found in the care plan section) Acute Rehab PT Goals Patient Stated Goal: Go home soon Progress towards PT goals: Progressing toward goals    Frequency    7X/week      PT Plan Current plan remains appropriate       AM-PAC PT "6 Clicks" Daily Activity  Outcome Measure  Difficulty turning over  in bed (including adjusting bedclothes, sheets and blankets)?: None Difficulty moving from lying on back to sitting on the side of the bed? : None Difficulty sitting down on and standing up from a chair with arms (e.g., wheelchair, bedside commode, etc,.)?: None Help needed moving to and from a bed to chair (including a wheelchair)?: A Little Help needed walking in hospital room?: A Little Help  needed climbing 3-5 steps with a railing? : None 6 Click Score: 22    End of Session   Activity Tolerance: No increased pain Patient left: in chair;with call bell/phone within reach;with chair alarm set Nurse Communication: Mobility status PT Visit Diagnosis: Difficulty in walking, not elsewhere classified (R26.2);Other abnormalities of gait and mobility (R26.89)     Time: 5400-8676 PT Time Calculation (min) (ACUTE ONLY): 25 min  Charges:  $Gait Training: 8-22 mins $Therapeutic Exercise: 8-22 mins          Riana Tessmer B. [place], [place], DPT [phone]            01/22/2018, 11:21 AM

## 2018-01-22 NOTE — Progress Notes (Signed)
PATIENT ID: [name]  MRN: [id number]  DOB/AGE:  82/02/1932 / 84 y.o.  2 Days Post-Op Procedure(s) (LRB): TOTAL HIP ARTHROPLASTY WITH REMOVAL OF INTRAMEDULLARY NAIL (Right)    PROGRESS NOTE Subjective: Patient is alert, oriented, no Nausea, no Vomiting, yes passing gas, . Taking PO well. Denies SOB, Chest or Calf Pain. Using Incentive Spirometer, PAS in place. Ambulate WBAT with posterior hip precautions, pt walking 130 ft with therapy Patient reports pain as  moderate  .    Objective: Vital signs in last 24 hours: Vitals:   01/21/18 0923 01/21/18 1413 01/21/18 2025 01/22/18 0504  BP:  112/61 (!) 133/57 (!) 128/96  Pulse:  70 73 75  Resp:  17 16 16   Temp:  97.7 F (36.5 C) 97.9 F (36.6 C) 98 F (36.7 C)  TempSrc:  Oral Oral Oral  SpO2: 95% 100% 96% 96%  Weight:      Height:          Intake/Output from previous day: I/O last 3 completed shifts: In: 2124.2 [P.O.:1320; I.V.:804.2] Out: 1100 [Urine:1100]   Intake/Output this shift: No intake/output data recorded.   LABORATORY DATA: Recent Labs    01/20/18 0735 01/21/18 0248 01/22/18 0350  WBC  --  5.4 8.0  HGB 11.6* 10.3* 9.6*  HCT 34.0* 31.8* 29.9*  PLT  --  163 170  NA 140 141  --   K 4.8 5.3*  --   CL  --  105  --   CO2  --  29  --   BUN  --  22*  --   CREATININE  --  1.23*  --   GLUCOSE 91 110*  --   CALCIUM  --  8.9  --     Examination: Neurologically intact Neurovascular intact Sensation intact distally Intact pulses distally Dorsiflexion/Plantar flexion intact Incision: dressing C/D/I and scant drainage No cellulitis present Compartment soft} XR AP&Lat of hip shows well placed\fixed THA  Assessment:   2 Days Post-Op Procedure(s) (LRB): TOTAL HIP ARTHROPLASTY WITH REMOVAL OF INTRAMEDULLARY NAIL (Right) ADDITIONAL DIAGNOSIS:  Expected Acute Blood Loss Anemia, Renal Insufficiency Chronic   Plan: PT/OT WBAT, THA, posterior hip precautions  DVT Prophylaxis: SCDx72 hrs, ASA 325 mg BID x 2  weeks  DISCHARGE PLAN: Home  DISCHARGE NEEDS: HHPT, Walker and 3-in-1 comode seat

## 2018-01-22 NOTE — Discharge Summary (Signed)
Patient ID: [name] MRN: [id number] DOB/AGE: Aug 10, 1932 84 y.o.  Admit date: 01/20/2018 Discharge date: 01/22/2018  Admission Diagnoses:  Principal Problem:   Osteoarthritis of right knee Active Problems:   Primary osteoarthritis of right hip   Discharge Diagnoses:  Same  Past Medical History:  Diagnosis Date  . Anxiety   . Arthritis   . Asthma   . Cancer ([place])    SKIN CA OF FACE  . Depression   . GERD (gastroesophageal reflux disease)   . Hypertension   . Pre-diabetes   . Prosthetic joint infection ([place])     Surgeries: Procedure(s): TOTAL HIP ARTHROPLASTY WITH REMOVAL OF INTRAMEDULLARY NAIL on 01/20/2018   Consultants:   Discharged Condition: Improved  Hospital Course: [name] is an 84 y.o. female who was admitted 01/20/2018 for operative treatment ofOsteoarthritis of right knee. Patient has severe unremitting pain that affects sleep, daily activities, and work/hobbies. After pre-op clearance the patient was taken to the operating room on 01/20/2018 and underwent  Procedure(s): TOTAL HIP ARTHROPLASTY WITH REMOVAL OF INTRAMEDULLARY NAIL.    Patient was given perioperative antibiotics:  Anti-infectives (From admission, onward)   Start     Dose/Rate Route Frequency Ordered Stop   01/20/18 0830  ceFAZolin (ANCEF) IVPB 2g/100 mL premix     2 g 200 mL/hr over 30 Minutes Intravenous To ShortStay Surgical 01/17/18 1352 01/20/18 0940       Patient was given sequential compression devices, early ambulation, and chemoprophylaxis to prevent DVT.  Patient benefited maximally from hospital stay and there were no complications.    Recent vital signs:  Patient Vitals for the past 24 hrs:  BP Temp Temp src Pulse Resp SpO2  01/22/18 0504 (!) 128/96 98 F (36.7 C) Oral 75 16 96 %  01/21/18 2025 (!) 133/57 97.9 F (36.6 C) Oral 73 16 96 %  01/21/18 1413 112/61 97.7 F (36.5 C) Oral 70 17 100 %  01/21/18 0923 - - - - - 95 %     Recent laboratory studies:   Recent Labs    01/20/18 0735 01/21/18 0248 01/22/18 0350  WBC  --  5.4 8.0  HGB 11.6* 10.3* 9.6*  HCT 34.0* 31.8* 29.9*  PLT  --  163 170  NA 140 141  --   K 4.8 5.3*  --   CL  --  105  --   CO2  --  29  --   BUN  --  22*  --   CREATININE  --  1.23*  --   GLUCOSE 91 110*  --   CALCIUM  --  8.9  --      Discharge Medications:   Allergies as of 01/22/2018      Reactions   Lisinopril Swelling   angioedema   Hydrochlorothiazide Other (See Comments)   UNSPECIFIED REACTION    Lipitor [atorvastatin Calcium] Other (See Comments)   UNSPECIFIED REACTION    Thiazide-type Diuretics Other (See Comments)   UNSPECIFIED REACTION    Zocor [simvastatin] Swelling   SWELLING REACTION UNSPECIFIED    Codeine Nausea Only      Medication List    STOP taking these medications   acetaminophen 500 MG tablet Commonly known as:  TYLENOL     TAKE these medications   albuterol 108 (90 Base) MCG/ACT inhaler Commonly known as:  PROVENTIL HFA;VENTOLIN HFA Inhale 2 puffs into the lungs every 6 (six) hours as needed. For shortness of breath   ALPRAZolam 0.5 MG tablet Commonly known  as:  XANAX Take 0.5 mg by mouth at bedtime as needed. For anxiety   amLODipine 10 MG tablet Commonly known as:  NORVASC Take 10 mg by mouth daily.   aspirin EC 325 MG tablet Take 1 tablet (325 mg total) by mouth 2 (two) times daily.   atenolol 50 MG tablet Commonly known as:  TENORMIN Take 1.5 tablets (75 mg total) by mouth daily.   BESIVANCE 0.6 % Susp Generic drug:  Besifloxacin HCl   CELEBREX 200 MG capsule Generic drug:  celecoxib Take 200 mg by mouth 2 (two) times daily.   cloNIDine 0.2 MG tablet Commonly known as:  CATAPRES Take 1 tablet (0.2 mg total) by mouth 2 (two) times daily.   DUREZOL 0.05 % Emul Generic drug:  Difluprednate   esomeprazole 40 MG capsule Commonly known as:  NEXIUM Take 40 mg by mouth daily before breakfast.   fluticasone 110 MCG/ACT inhaler Commonly known as:   FLOVENT HFA Inhale 1 puff into the lungs 2 (two) times daily.   furosemide 40 MG tablet Commonly known as:  LASIX Take 1 tablet (40 mg total) by mouth daily.   gabapentin 300 MG capsule Commonly known as:  NEURONTIN Take 1 capsule (300 mg total) by mouth 3 (three) times daily.   morphine 15 MG tablet Commonly known as:  MSIR Take 1 tablet (15 mg total) by mouth every 4 (four) hours as needed for moderate pain.   PATADAY 0.2 % Soln Generic drug:  Olopatadine HCl   tiZANidine 2 MG tablet Commonly known as:  ZANAFLEX Take 1 tablet (2 mg total) by mouth every 6 (six) hours as needed.   zolpidem 5 MG tablet Commonly known as:  Licensed conveyancer  (From admission, onward)        Start     Ordered   01/20/18 1637  DME Walker rolling  Once    Question:  Patient needs a walker to treat with the following condition  Answer:  Status post right hip replacement   01/20/18 1636   01/20/18 1637  DME 3 n 1  Once     01/20/18 1636       Discharge Care Instructions  (From admission, onward)        Start     Ordered   01/22/18 0000  Weight bearing as tolerated     01/22/18 0829      Diagnostic Studies: Dg Chest 2 View  Result Date: 01/09/2018 CLINICAL DATA:  Preop chest x-ray prior to hip surgery 01/20/2018 EXAM: CHEST - 2 VIEW COMPARISON:  None. FINDINGS: There is no focal parenchymal opacity. There is no pleural effusion or pneumothorax. The heart and mediastinal contours are unremarkable. There is severe osteoarthritis of the right glenohumeral joint. There is loss of the normal right acromion humeral distance as can be seen with a chronic rotator cuff tear. IMPRESSION: No active cardiopulmonary disease. Electronically Signed   By: Kathreen Devoid   On: 01/09/2018 16:51   Dg Hip Port Unilat With Pelvis 1v Right  Result Date: 01/20/2018 CLINICAL DATA:  84 year old female post right hip arthroplasty. Initial encounter. EXAM: DG HIP (WITH OR WITHOUT  PELVIS) 1V PORT RIGHT COMPARISON:  11/29/2017. FINDINGS: Right femoral rod removed and placement of right total hip prosthesis which appears in satisfactory position without complication noted. Bony overgrowth distal to the femoral stem consistent with result of prior fracture and healing. Remote left hip replacement. IMPRESSION:  Right femoral rod removed and placement of right total hip prosthesis which appears in satisfactory position without complication noted. Bony overgrowth distal to the femoral stem consistent with result of prior fracture and healing. Electronically Signed   By: Genia Del M.D.   On: 01/20/2018 13:13    Disposition: Discharge disposition: 01-Home or Self Care       Discharge Instructions    Call MD / Call 911   Complete by:  As directed    If you experience chest pain or shortness of breath, CALL 911 and be transported to the hospital emergency room.  If you develope a fever above 101 F, pus (white drainage) or increased drainage or redness at the wound, or calf pain, call your surgeon's office.   Constipation Prevention   Complete by:  As directed    Drink plenty of fluids.  Prune juice may be helpful.  You may use a stool softener, such as Colace (over the counter) 100 mg twice a day.  Use MiraLax (over the counter) for constipation as needed.   Diet - low sodium heart healthy   Complete by:  As directed    Driving restrictions   Complete by:  As directed    No driving for 2 weeks   Follow the hip precautions as taught in Physical Therapy   Complete by:  As directed    Increase activity slowly as tolerated   Complete by:  As directed    Patient may shower   Complete by:  As directed    You may shower without a dressing once there is no drainage.  Do not wash over the wound.  If drainage remains, cover wound with plastic wrap and then shower.   Weight bearing as tolerated   Complete by:  As directed       Follow-up Information    Frederik Pear, MD In 2  weeks.   Specialty:  Orthopedic Surgery Contact information: [address] [place] [address] [phone]            Signed: Joanell Rising 01/22/2018, 8:29 AM

## 2018-08-13 ENCOUNTER — Other Ambulatory Visit: Payer: Self-pay | Admitting: Orthopedic Surgery

## 2018-08-13 DIAGNOSIS — M19011 Primary osteoarthritis, right shoulder: Secondary | ICD-10-CM

## 2018-08-18 ENCOUNTER — Ambulatory Visit
Admission: RE | Admit: 2018-08-18 | Discharge: 2018-08-18 | Disposition: A | Discharge status: Home / Self Care | Payer: Medicare Other | Source: Ambulatory Visit | Attending: Orthopedic Surgery | Admitting: Orthopedic Surgery

## 2018-08-18 DIAGNOSIS — M19011 Primary osteoarthritis, right shoulder: Secondary | ICD-10-CM

## 2018-09-18 ENCOUNTER — Other Ambulatory Visit: Payer: Self-pay | Admitting: Orthopedic Surgery

## 2018-09-29 NOTE — Pre-Procedure Instructions (Signed)
[name]  09/29/2018      [id number] - [place], [place] - [address] [place] [address] Phone: [phone] Fax: [facility] - [place], [place] [address] Phone: [phone] Fax: [phone]    Your procedure is scheduled on January 9th.  Report to [facility] at [place].M.  Call this number if you have problems the morning of surgery:  [phone]   Remember:  Do not eat or drink after midnight.    Take these medicines the morning of surgery with A SIP OF WATER albuterol (PROVENTIL HFA;VENTOLIN HFA) if needed. Bring Inhaler with you to surgery. amLODipine (NORVASC)  atenolol (TENORMIN) cloNIDine (CATAPRES) esomeprazole (NEXIUM)  fluticasone (FLOVENT HFA)  gabapentin (NEURONTIN) morphine (MSIR)  If needed tiZANidine (ZANAFLEX)  If needed  Follow your surgeon's instructions on when to stop Asprin.  If no instructions were given by your surgeon then you will need to call the office to get those instructions.    7 days prior to surgery STOP taking any Aspirin (unless otherwise instructed by your surgeon), Aleve, Naproxen, Ibuprofen, Motrin, Advil, Goody's, BC's, all herbal medications, fish oil, and all vitamins.     Do not wear jewelry, make-up or nail polish.  Do not wear lotions, powders, or perfumes, or deodorant.  Do not shave 48 hours prior to surgery.  Men may shave face and neck.  Do not bring valuables to the hospital.  [facility] is not responsible for any belongings or valuables.  Contacts, dentures or bridgework may not be worn into surgery.  Leave your suitcase in the car.  After surgery it may be brought to your room.  For patients admitted to the hospital, discharge time will be determined by your treatment team.  Patients discharged the day of surgery will not be allowed to  drive home.    [facility]- Preparing For Surgery  Before surgery, you can play an important role. Because skin is not sterile, your skin needs to be as free of germs as possible. You can reduce the number of germs on your skin by washing with CHG (chlorahexidine gluconate) Soap before surgery.  CHG is an antiseptic cleaner which kills germs and bonds with the skin to continue killing germs even after washing.    Oral Hygiene is also important to reduce your risk of infection.  Remember - BRUSH YOUR TEETH THE MORNING OF SURGERY WITH YOUR REGULAR TOOTHPASTE  Please do not use if you have an allergy to CHG or antibacterial soaps. If your skin becomes reddened/irritated stop using the CHG.  Do not shave (including legs and underarms) for at least 48 hours prior to first CHG shower. It is OK to shave your face.  Please follow these instructions carefully.   1. Shower the NIGHT BEFORE SURGERY and the MORNING OF SURGERY with CHG.   2. If you chose to wash your hair, wash your hair first as usual with your normal shampoo.  3. After you shampoo, rinse your hair and body thoroughly to remove the shampoo.  4. Use CHG as you would any other liquid soap. You can apply CHG directly to the skin and wash gently with a scrungie or a clean washcloth.   5. Apply the CHG Soap to your body ONLY FROM THE NECK DOWN.  Do not use on open  wounds or open sores. Avoid contact with your eyes, ears, mouth and genitals (private parts). Wash Face and genitals (private parts)  with your normal soap.  6. Wash thoroughly, paying special attention to the area where your surgery will be performed.  7. Thoroughly rinse your body with warm water from the neck down.  8. DO NOT shower/wash with your normal soap after using and rinsing off the CHG Soap.  9. Pat yourself dry with a CLEAN TOWEL.  10. Wear CLEAN PAJAMAS to bed the night before surgery, wear comfortable clothes the morning of surgery  11. Place CLEAN SHEETS on  your bed the night of your first shower and DO NOT SLEEP WITH PETS.    Day of Surgery:  Do not apply any deodorants/lotions.  Please wear clean clothes to the hospital/surgery center.   Remember to brush your teeth WITH YOUR REGULAR TOOTHPASTE.    Please read over the following fact sheets that you were given.

## 2018-09-30 ENCOUNTER — Other Ambulatory Visit: Payer: Self-pay

## 2018-09-30 ENCOUNTER — Encounter (HOSPITAL_COMMUNITY)
Admission: RE | Admit: 2018-09-30 | Discharge: 2018-09-30 | Disposition: A | Discharge status: Home / Self Care | Payer: Medicare Other | Source: Ambulatory Visit | Attending: Orthopedic Surgery | Admitting: Orthopedic Surgery

## 2018-09-30 ENCOUNTER — Encounter (HOSPITAL_COMMUNITY): Payer: Self-pay

## 2018-09-30 DIAGNOSIS — Z01812 Encounter for preprocedural laboratory examination: Secondary | ICD-10-CM

## 2018-09-30 HISTORY — DX: Calculus of gallbladder without cholecystitis without obstruction: K80.20

## 2018-09-30 LAB — CBC WITH DIFFERENTIAL/PLATELET
Abs Immature Granulocytes: 0.03 10*3/uL (ref 0.00–0.07)
Basophils Absolute: 0 10*3/uL (ref 0.0–0.1)
Basophils Relative: 0 %
Eosinophils Absolute: 0.4 10*3/uL (ref 0.0–0.5)
Eosinophils Relative: 4 %
HCT: 40.4 % (ref 36.0–46.0)
Hemoglobin: 12.6 g/dL (ref 12.0–15.0)
Immature Granulocytes: 0 %
Lymphocytes Relative: 18 %
Lymphs Abs: 1.9 10*3/uL (ref 0.7–4.0)
MCH: 30.4 pg (ref 26.0–34.0)
MCHC: 31.2 g/dL (ref 30.0–36.0)
MCV: 97.3 fL (ref 80.0–100.0)
Monocytes Absolute: 0.6 10*3/uL (ref 0.1–1.0)
Monocytes Relative: 6 %
Neutro Abs: 7.4 10*3/uL (ref 1.7–7.7)
Neutrophils Relative %: 72 %
Platelets: 246 10*3/uL (ref 150–400)
RBC: 4.15 MIL/uL (ref 3.87–5.11)
RDW: 15.4 % (ref 11.5–15.5)
WBC: 10.4 10*3/uL (ref 4.0–10.5)
nRBC: 0 % (ref 0.0–0.2)

## 2018-09-30 LAB — TYPE AND SCREEN
ABO/RH(D): A POS
Antibody Screen: NEGATIVE

## 2018-09-30 LAB — COMPREHENSIVE METABOLIC PANEL
ALT: 13 U/L (ref 0–44)
AST: 13 U/L — ABNORMAL LOW (ref 15–41)
Albumin: 3.9 g/dL (ref 3.5–5.0)
Alkaline Phosphatase: 50 U/L (ref 38–126)
Anion gap: 7 (ref 5–15)
BUN: 35 mg/dL — ABNORMAL HIGH (ref 8–23)
CO2: 22 mmol/L (ref 22–32)
Calcium: 9.2 mg/dL (ref 8.9–10.3)
Chloride: 111 mmol/L (ref 98–111)
Creatinine, Ser: 1.21 mg/dL — ABNORMAL HIGH (ref 0.44–1.00)
GFR calc Af Amer: 48 mL/min — ABNORMAL LOW (ref >60)
GFR calc non Af Amer: 41 mL/min — ABNORMAL LOW (ref >60)
Glucose, Bld: 102 mg/dL — ABNORMAL HIGH (ref 70–99)
Potassium: 4.6 mmol/L (ref 3.5–5.1)
Sodium: 140 mmol/L (ref 135–145)
TOTAL PROTEIN: 6.8 g/dL (ref 6.5–8.1)
Total Bilirubin: 0.5 mg/dL (ref 0.3–1.2)

## 2018-09-30 LAB — URINALYSIS, ROUTINE W REFLEX MICROSCOPIC
Bilirubin Urine: NEGATIVE
Glucose, UA: NEGATIVE mg/dL
Hgb urine dipstick: NEGATIVE
KETONES UR: NEGATIVE mg/dL
Nitrite: NEGATIVE
Protein, ur: NEGATIVE mg/dL
Specific Gravity, Urine: 1.009 (ref 1.005–1.030)
pH: 5 (ref 5.0–8.0)

## 2018-09-30 LAB — APTT: APTT: 28 s (ref 24–36)

## 2018-09-30 LAB — SURGICAL PCR SCREEN
MRSA, PCR: NEGATIVE
Staphylococcus aureus: POSITIVE — AB

## 2018-09-30 LAB — PROTIME-INR
INR: 0.97
PROTHROMBIN TIME: 12.8 s (ref 11.4–15.2)

## 2018-09-30 NOTE — Progress Notes (Signed)
PCP - Dr. [name]  Cardiologist - Denies  Chest x-ray - 01/09/18 (E)  EKG - 12/03/17 (E)  Stress Test - Denies  ECHO - Denies  Cardiac Cath - Denies  AICD- na PM- na LOOP- na  Sleep Study - Denies CPAP - None  LABS- 09/30/2018: CBC w/D, CMP, PT, PTT, T/S, PCR, UA  ASA- LD- 1/7   Anesthesia- No  Pt denies having chest pain, sob, or fever at this time. All instructions explained to the pt, with a verbal understanding of the material. Pt agrees to go over the instructions while at home for a better understanding. The opportunity to ask questions was provided.

## 2018-10-02 ENCOUNTER — Inpatient Hospital Stay (HOSPITAL_COMMUNITY): Payer: Medicare Other

## 2018-10-02 ENCOUNTER — Inpatient Hospital Stay (HOSPITAL_COMMUNITY)
Admission: RE | Admit: 2018-10-02 | Discharge: 2018-10-03 | DRG: 483 | Disposition: A | Discharge status: Home / Self Care | Payer: Medicare Other | Attending: Orthopedic Surgery | Admitting: Orthopedic Surgery

## 2018-10-02 ENCOUNTER — Encounter (HOSPITAL_COMMUNITY)
Admission: RE | Disposition: A | Discharge status: Home / Self Care | Payer: Self-pay | Source: Home / Self Care | Attending: Orthopedic Surgery

## 2018-10-02 ENCOUNTER — Inpatient Hospital Stay (HOSPITAL_COMMUNITY): Payer: Medicare Other | Admitting: Certified Registered Nurse Anesthetist

## 2018-10-02 ENCOUNTER — Encounter (HOSPITAL_COMMUNITY): Payer: Self-pay | Admitting: *Deleted

## 2018-10-02 ENCOUNTER — Other Ambulatory Visit: Payer: Self-pay

## 2018-10-02 DIAGNOSIS — Z96651 Presence of right artificial knee joint: Secondary | ICD-10-CM | POA: Diagnosis present

## 2018-10-02 DIAGNOSIS — F419 Anxiety disorder, unspecified: Secondary | ICD-10-CM | POA: Diagnosis present

## 2018-10-02 DIAGNOSIS — Z7951 Long term (current) use of inhaled steroids: Secondary | ICD-10-CM

## 2018-10-02 DIAGNOSIS — Z96641 Presence of right artificial hip joint: Secondary | ICD-10-CM | POA: Diagnosis present

## 2018-10-02 DIAGNOSIS — M75101 Unspecified rotator cuff tear or rupture of right shoulder, not specified as traumatic: Secondary | ICD-10-CM | POA: Diagnosis present

## 2018-10-02 DIAGNOSIS — R7303 Prediabetes: Secondary | ICD-10-CM | POA: Diagnosis present

## 2018-10-02 DIAGNOSIS — K219 Gastro-esophageal reflux disease without esophagitis: Secondary | ICD-10-CM | POA: Diagnosis present

## 2018-10-02 DIAGNOSIS — M19011 Primary osteoarthritis, right shoulder: Secondary | ICD-10-CM | POA: Diagnosis present

## 2018-10-02 DIAGNOSIS — F329 Major depressive disorder, single episode, unspecified: Secondary | ICD-10-CM | POA: Diagnosis present

## 2018-10-02 DIAGNOSIS — Z9889 Other specified postprocedural states: Secondary | ICD-10-CM

## 2018-10-02 DIAGNOSIS — J45909 Unspecified asthma, uncomplicated: Secondary | ICD-10-CM | POA: Diagnosis present

## 2018-10-02 DIAGNOSIS — I10 Essential (primary) hypertension: Secondary | ICD-10-CM | POA: Diagnosis present

## 2018-10-02 DIAGNOSIS — Z7982 Long term (current) use of aspirin: Secondary | ICD-10-CM | POA: Diagnosis not present

## 2018-10-02 DIAGNOSIS — Z79899 Other long term (current) drug therapy: Secondary | ICD-10-CM

## 2018-10-02 DIAGNOSIS — Z833 Family history of diabetes mellitus: Secondary | ICD-10-CM | POA: Diagnosis not present

## 2018-10-02 DIAGNOSIS — Z96611 Presence of right artificial shoulder joint: Secondary | ICD-10-CM

## 2018-10-02 HISTORY — PX: TOTAL SHOULDER ARTHROPLASTY: SHX126

## 2018-10-02 SURGERY — ARTHROPLASTY, SHOULDER, TOTAL
Anesthesia: General | Site: Shoulder | Laterality: Right

## 2018-10-02 MED ORDER — PROPOFOL 10 MG/ML IV BOLUS
INTRAVENOUS | Status: AC
  Filled 2018-10-02: qty 20

## 2018-10-02 MED ORDER — ACETAMINOPHEN 325 MG PO TABS: 325.0000 mg | ORAL_TABLET | Freq: Four times a day (QID) | ORAL | Status: DC | PRN

## 2018-10-02 MED ORDER — PHENYLEPHRINE 40 MCG/ML (10ML) SYRINGE FOR IV PUSH (FOR BLOOD PRESSURE SUPPORT)
PREFILLED_SYRINGE | INTRAVENOUS | Status: AC
  Filled 2018-10-02: qty 10

## 2018-10-02 MED ORDER — CEFAZOLIN SODIUM-DEXTROSE 1-4 GM/50ML-% IV SOLN
1.0000 g | Freq: Four times a day (QID) | INTRAVENOUS | Status: AC
  Administered 2018-10-02 (×2): 1 g via INTRAVENOUS
  Filled 2018-10-02 (×2): qty 50

## 2018-10-02 MED ORDER — BUPIVACAINE LIPOSOME 1.3 % IJ SUSP
INTRAMUSCULAR | Status: DC | PRN
  Administered 2018-10-02: 10 mL

## 2018-10-02 MED ORDER — ALUM & MAG HYDROXIDE-SIMETH 200-200-20 MG/5ML PO SUSP: 30.0000 mL | ORAL | Status: DC | PRN

## 2018-10-02 MED ORDER — FENTANYL CITRATE (PF) 100 MCG/2ML IJ SOLN: 25.0000 ug | INTRAMUSCULAR | Status: DC | PRN

## 2018-10-02 MED ORDER — AMLODIPINE BESYLATE 10 MG PO TABS
10.0000 mg | ORAL_TABLET | Freq: Every day | ORAL | Status: DC
  Administered 2018-10-03: 10 mg via ORAL
  Filled 2018-10-02: qty 1

## 2018-10-02 MED ORDER — LACTATED RINGERS IV SOLN
INTRAVENOUS | Status: DC | PRN
  Administered 2018-10-02: 07:00:00 via INTRAVENOUS

## 2018-10-02 MED ORDER — FUROSEMIDE 40 MG PO TABS
40.0000 mg | ORAL_TABLET | Freq: Every day | ORAL | Status: DC
  Administered 2018-10-02 – 2018-10-03 (×2): 40 mg via ORAL
  Filled 2018-10-02 (×2): qty 1

## 2018-10-02 MED ORDER — ROCURONIUM BROMIDE 10 MG/ML (PF) SYRINGE
PREFILLED_SYRINGE | INTRAVENOUS | Status: DC | PRN
Start: 2018-10-02 — End: 2018-10-02
  Administered 2018-10-02: 10 mg via INTRAVENOUS
  Administered 2018-10-02: 40 mg via INTRAVENOUS

## 2018-10-02 MED ORDER — DIPHENHYDRAMINE HCL 12.5 MG/5ML PO ELIX: 12.5000 mg | ORAL_SOLUTION | ORAL | Status: DC | PRN

## 2018-10-02 MED ORDER — ASPIRIN EC 325 MG PO TBEC
325.0000 mg | DELAYED_RELEASE_TABLET | Freq: Every day | ORAL | Status: DC
  Administered 2018-10-02 – 2018-10-03 (×2): 325 mg via ORAL
  Filled 2018-10-02 (×2): qty 1

## 2018-10-02 MED ORDER — LIDOCAINE 2% (20 MG/ML) 5 ML SYRINGE
INTRAMUSCULAR | Status: AC
  Filled 2018-10-02: qty 5

## 2018-10-02 MED ORDER — FENTANYL CITRATE (PF) 250 MCG/5ML IJ SOLN
INTRAMUSCULAR | Status: DC | PRN
  Administered 2018-10-02: 50 ug via INTRAVENOUS

## 2018-10-02 MED ORDER — DOCUSATE SODIUM 100 MG PO CAPS
100.0000 mg | ORAL_CAPSULE | Freq: Two times a day (BID) | ORAL | Status: DC
  Administered 2018-10-02 – 2018-10-03 (×2): 100 mg via ORAL
  Filled 2018-10-02 (×2): qty 1

## 2018-10-02 MED ORDER — ALPRAZOLAM 0.5 MG PO TABS: 0.5000 mg | ORAL_TABLET | Freq: Every evening | ORAL | Status: DC | PRN

## 2018-10-02 MED ORDER — HEMOSTATIC AGENTS (NO CHARGE) OPTIME: TOPICAL | Status: DC | PRN

## 2018-10-02 MED ORDER — ONDANSETRON HCL 4 MG/2ML IJ SOLN: 4.0000 mg | Freq: Four times a day (QID) | INTRAMUSCULAR | Status: DC | PRN

## 2018-10-02 MED ORDER — ATENOLOL 50 MG PO TABS
75.0000 mg | ORAL_TABLET | Freq: Every day | ORAL | Status: DC
  Administered 2018-10-03: 75 mg via ORAL
  Filled 2018-10-02: qty 2

## 2018-10-02 MED ORDER — PANTOPRAZOLE SODIUM 40 MG PO TBEC: 80.0000 mg | DELAYED_RELEASE_TABLET | Freq: Every day | ORAL | Status: DC

## 2018-10-02 MED ORDER — MENTHOL 3 MG MT LOZG: 1.0000 | LOZENGE | OROMUCOSAL | Status: DC | PRN

## 2018-10-02 MED ORDER — PHENOL 1.4 % MT LIQD: 1.0000 | OROMUCOSAL | Status: DC | PRN

## 2018-10-02 MED ORDER — HYDROCODONE-ACETAMINOPHEN 7.5-325 MG PO TABS
1.0000 | ORAL_TABLET | ORAL | Status: DC | PRN
  Administered 2018-10-03 (×2): 2 via ORAL
  Filled 2018-10-02 (×2): qty 2

## 2018-10-02 MED ORDER — METOCLOPRAMIDE HCL 5 MG/ML IJ SOLN: 5.0000 mg | Freq: Three times a day (TID) | INTRAMUSCULAR | Status: DC | PRN

## 2018-10-02 MED ORDER — ONDANSETRON HCL 4 MG/2ML IJ SOLN
INTRAMUSCULAR | Status: DC | PRN
  Administered 2018-10-02: 4 mg via INTRAVENOUS

## 2018-10-02 MED ORDER — LIDOCAINE 2% (20 MG/ML) 5 ML SYRINGE
INTRAMUSCULAR | Status: DC | PRN
  Administered 2018-10-02: 60 mg via INTRAVENOUS

## 2018-10-02 MED ORDER — DEXAMETHASONE SODIUM PHOSPHATE 10 MG/ML IJ SOLN
INTRAMUSCULAR | Status: AC
  Filled 2018-10-02: qty 1

## 2018-10-02 MED ORDER — SODIUM CHLORIDE 0.9 % IR SOLN
Status: DC | PRN
  Administered 2018-10-02: 3000 mL

## 2018-10-02 MED ORDER — TIZANIDINE HCL 4 MG PO TABS: 2.0000 mg | ORAL_TABLET | Freq: Four times a day (QID) | ORAL | Status: DC | PRN

## 2018-10-02 MED ORDER — ONDANSETRON HCL 4 MG PO TABS: 4.0000 mg | ORAL_TABLET | Freq: Four times a day (QID) | ORAL | Status: DC | PRN

## 2018-10-02 MED ORDER — CHLORHEXIDINE GLUCONATE 4 % EX LIQD: 60.0000 mL | Freq: Once | CUTANEOUS | Status: DC

## 2018-10-02 MED ORDER — MORPHINE SULFATE (PF) 2 MG/ML IV SOLN: 0.5000 mg | INTRAVENOUS | Status: DC | PRN

## 2018-10-02 MED ORDER — ASPIRIN EC 325 MG PO TBEC: 325.0000 mg | DELAYED_RELEASE_TABLET | Freq: Two times a day (BID) | ORAL | Status: DC

## 2018-10-02 MED ORDER — METOCLOPRAMIDE HCL 5 MG PO TABS: 5.0000 mg | ORAL_TABLET | Freq: Three times a day (TID) | ORAL | Status: DC | PRN

## 2018-10-02 MED ORDER — EPHEDRINE 5 MG/ML INJ
INTRAVENOUS | Status: AC
  Filled 2018-10-02: qty 10

## 2018-10-02 MED ORDER — DEXAMETHASONE SODIUM PHOSPHATE 10 MG/ML IJ SOLN
INTRAMUSCULAR | Status: DC | PRN
  Administered 2018-10-02: 10 mg via INTRAVENOUS

## 2018-10-02 MED ORDER — 0.9 % SODIUM CHLORIDE (POUR BTL) OPTIME
TOPICAL | Status: DC | PRN
  Administered 2018-10-02: 1000 mL

## 2018-10-02 MED ORDER — CELECOXIB 200 MG PO CAPS
200.0000 mg | ORAL_CAPSULE | Freq: Two times a day (BID) | ORAL | Status: DC
  Administered 2018-10-02 – 2018-10-03 (×2): 200 mg via ORAL
  Filled 2018-10-02 (×2): qty 1

## 2018-10-02 MED ORDER — FENTANYL CITRATE (PF) 250 MCG/5ML IJ SOLN
INTRAMUSCULAR | Status: AC
  Filled 2018-10-02: qty 5

## 2018-10-02 MED ORDER — CEFAZOLIN SODIUM-DEXTROSE 2-4 GM/100ML-% IV SOLN
2.0000 g | INTRAVENOUS | Status: AC
  Administered 2018-10-02: 2 g via INTRAVENOUS
  Filled 2018-10-02: qty 100

## 2018-10-02 MED ORDER — CLONIDINE HCL 0.2 MG PO TABS
0.2000 mg | ORAL_TABLET | Freq: Two times a day (BID) | ORAL | Status: DC
  Administered 2018-10-02 – 2018-10-03 (×2): 0.2 mg via ORAL
  Filled 2018-10-02 (×2): qty 1

## 2018-10-02 MED ORDER — OXYCODONE HCL 5 MG PO TABS: 5.0000 mg | ORAL_TABLET | Freq: Once | ORAL | Status: DC | PRN

## 2018-10-02 MED ORDER — BUDESONIDE 0.5 MG/2ML IN SUSP
0.5000 mg | Freq: Two times a day (BID) | RESPIRATORY_TRACT | Status: DC
  Administered 2018-10-02 – 2018-10-03 (×2): 0.5 mg via RESPIRATORY_TRACT
  Filled 2018-10-02 (×2): qty 2

## 2018-10-02 MED ORDER — TRANEXAMIC ACID-NACL 1000-0.7 MG/100ML-% IV SOLN
INTRAVENOUS | Status: AC
  Filled 2018-10-02: qty 100

## 2018-10-02 MED ORDER — GABAPENTIN 300 MG PO CAPS
300.0000 mg | ORAL_CAPSULE | Freq: Three times a day (TID) | ORAL | Status: DC
  Administered 2018-10-02 – 2018-10-03 (×3): 300 mg via ORAL
  Filled 2018-10-02 (×3): qty 1

## 2018-10-02 MED ORDER — HYDROCODONE-ACETAMINOPHEN 5-325 MG PO TABS
1.0000 | ORAL_TABLET | ORAL | Status: DC | PRN
  Administered 2018-10-02 (×2): 1 via ORAL
  Filled 2018-10-02 (×2): qty 1

## 2018-10-02 MED ORDER — BUPIVACAINE HCL (PF) 0.5 % IJ SOLN
INTRAMUSCULAR | Status: DC | PRN
  Administered 2018-10-02: 15 mL via PERINEURAL

## 2018-10-02 MED ORDER — OXYCODONE HCL 5 MG/5ML PO SOLN: 5.0000 mg | Freq: Once | ORAL | Status: DC | PRN

## 2018-10-02 MED ORDER — ACETAMINOPHEN 500 MG PO TABS
500.0000 mg | ORAL_TABLET | Freq: Four times a day (QID) | ORAL | Status: AC
  Administered 2018-10-02 (×2): 500 mg via ORAL
  Filled 2018-10-02 (×2): qty 1

## 2018-10-02 MED ORDER — BISACODYL 5 MG PO TBEC
5.0000 mg | DELAYED_RELEASE_TABLET | Freq: Every day | ORAL | Status: DC | PRN
  Filled 2018-10-02: qty 1

## 2018-10-02 MED ORDER — TRANEXAMIC ACID-NACL 1000-0.7 MG/100ML-% IV SOLN
INTRAVENOUS | Status: DC | PRN
  Administered 2018-10-02: 1000 mg via INTRAVENOUS

## 2018-10-02 MED ORDER — ALBUTEROL SULFATE (2.5 MG/3ML) 0.083% IN NEBU: 2.5000 mg | INHALATION_SOLUTION | Freq: Four times a day (QID) | RESPIRATORY_TRACT | Status: DC | PRN

## 2018-10-02 MED ORDER — ROCURONIUM BROMIDE 50 MG/5ML IV SOSY
PREFILLED_SYRINGE | INTRAVENOUS | Status: AC
  Filled 2018-10-02: qty 5

## 2018-10-02 MED ORDER — SODIUM CHLORIDE 0.9 % IV SOLN
INTRAVENOUS | Status: DC | PRN
  Administered 2018-10-02: 30 ug/min via INTRAVENOUS

## 2018-10-02 MED ORDER — SUGAMMADEX SODIUM 200 MG/2ML IV SOLN
INTRAVENOUS | Status: DC | PRN
Start: 2018-10-02 — End: 2018-10-02
  Administered 2018-10-02: 100 mg via INTRAVENOUS

## 2018-10-02 MED ORDER — ONDANSETRON HCL 4 MG/2ML IJ SOLN: 4.0000 mg | Freq: Once | INTRAMUSCULAR | Status: DC | PRN

## 2018-10-02 MED ORDER — POLYETHYLENE GLYCOL 3350 17 G PO PACK: 17.0000 g | PACK | Freq: Every day | ORAL | Status: DC | PRN

## 2018-10-02 MED ORDER — SUCCINYLCHOLINE CHLORIDE 200 MG/10ML IV SOSY
PREFILLED_SYRINGE | INTRAVENOUS | Status: AC
  Filled 2018-10-02: qty 10

## 2018-10-02 MED ORDER — ONDANSETRON HCL 4 MG/2ML IJ SOLN
INTRAMUSCULAR | Status: AC
  Filled 2018-10-02: qty 2

## 2018-10-02 MED ORDER — HYDROCODONE-ACETAMINOPHEN 5-325 MG PO TABS: 1.0000 | ORAL_TABLET | ORAL | 0 refills | Status: DC | PRN

## 2018-10-02 MED ORDER — PROPOFOL 10 MG/ML IV BOLUS
INTRAVENOUS | Status: DC | PRN
  Administered 2018-10-02: 100 mg via INTRAVENOUS

## 2018-10-02 SURGICAL SUPPLY — 77 items
AID PSTN UNV HD RSTRNT DISP (MISCELLANEOUS) ×1
BASEPLATE P2 COATD GLND 6.5X30 (Shoulder) IMPLANT
BIT DRILL 2.5 DIA 127 CALI (BIT) ×1 IMPLANT
BIT DRILL 4 DIA CALIBRATED (BIT) ×1 IMPLANT
BIT DRILL 5/64X5 DISP (BIT) ×2 IMPLANT
BLADE SAW SAG 73X25 THK (BLADE) ×1
BLADE SAW SGTL 73X25 THK (BLADE) ×1 IMPLANT
BLADE SURG 15 STRL LF DISP TIS (BLADE) ×1 IMPLANT
BLADE SURG 15 STRL SS (BLADE) ×2
BSPLAT GLND 30 STRL LF SHLDR (Shoulder) ×1 IMPLANT
CHLORAPREP W/TINT 26ML (MISCELLANEOUS) ×2 IMPLANT
COVER SURGICAL LIGHT HANDLE (MISCELLANEOUS) ×2 IMPLANT
COVER WAND RF STERILE (DRAPES) ×2 IMPLANT
DRAPE INCISE IOBAN 66X45 STRL (DRAPES) ×2 IMPLANT
DRAPE ORTHO SPLIT 77X108 STRL (DRAPES) ×4
DRAPE SURG 17X23 STRL (DRAPES) ×2 IMPLANT
DRAPE SURG ORHT 6 SPLT 77X108 (DRAPES) ×2 IMPLANT
DRAPE U-SHAPE 47X51 STRL (DRAPES) ×2 IMPLANT
DRSG AQUACEL AG ADV 3.5X 6 (GAUZE/BANDAGES/DRESSINGS) ×1 IMPLANT
DRSG AQUACEL AG ADV 3.5X10 (GAUZE/BANDAGES/DRESSINGS) IMPLANT
ELECT BLADE 4.0 EZ CLEAN MEGAD (MISCELLANEOUS)
ELECT REM PT RETURN 9FT ADLT (ELECTROSURGICAL) ×2
ELECTRODE BLDE 4.0 EZ CLN MEGD (MISCELLANEOUS) IMPLANT
ELECTRODE REM PT RTRN 9FT ADLT (ELECTROSURGICAL) ×1 IMPLANT
GLOVE BIO SURGEON STRL SZ7 (GLOVE) ×2 IMPLANT
GLOVE BIO SURGEON STRL SZ7.5 (GLOVE) ×2 IMPLANT
GLOVE BIOGEL PI IND STRL 7.0 (GLOVE) ×1 IMPLANT
GLOVE BIOGEL PI IND STRL 8 (GLOVE) ×1 IMPLANT
GLOVE BIOGEL PI INDICATOR 7.0 (GLOVE) ×1
GLOVE BIOGEL PI INDICATOR 8 (GLOVE) ×1
GOWN STRL REUS W/ TWL LRG LVL3 (GOWN DISPOSABLE) ×1 IMPLANT
GOWN STRL REUS W/ TWL XL LVL3 (GOWN DISPOSABLE) ×1 IMPLANT
GOWN STRL REUS W/TWL LRG LVL3 (GOWN DISPOSABLE) ×2
GOWN STRL REUS W/TWL XL LVL3 (GOWN DISPOSABLE) ×2
HANDPIECE INTERPULSE COAX TIP (DISPOSABLE) ×2
HEMOSTAT SURGICEL 2X14 (HEMOSTASIS) ×1 IMPLANT
HOOD PEEL AWAY FLYTE STAYCOOL (MISCELLANEOUS) ×4 IMPLANT
INSERT SMALL SOCKET 32MM (Insert) ×1 IMPLANT
KIT BASIN OR (CUSTOM PROCEDURE TRAY) ×2 IMPLANT
KIT TURNOVER KIT B (KITS) ×2 IMPLANT
MANIFOLD NEPTUNE II (INSTRUMENTS) ×2 IMPLANT
NDL MAYO TROCAR (NEEDLE) ×1 IMPLANT
NEEDLE MAYO TROCAR (NEEDLE) ×2 IMPLANT
NS IRRIG 1000ML POUR BTL (IV SOLUTION) ×2 IMPLANT
P2 COATDE GLNOID BSEPLT 6.5X30 (Shoulder) ×2 IMPLANT
PACK SHOULDER (CUSTOM PROCEDURE TRAY) ×2 IMPLANT
PAD ARMBOARD 7.5X6 YLW CONV (MISCELLANEOUS) ×4 IMPLANT
RESTRAINT HEAD UNIVERSAL NS (MISCELLANEOUS) ×2 IMPLANT
RETRIEVER SUT HEWSON (MISCELLANEOUS) ×2 IMPLANT
SCREW BONE LOCKING RSP 5.0X14 (Screw) ×2 IMPLANT
SCREW BONE RSP LOCK 5X14 (Screw) IMPLANT
SCREW BONE RSP LOCK 5X22 (Screw) IMPLANT
SCREW BONE RSP LOCK 5X26 (Screw) IMPLANT
SCREW BONE RSP LOCKING 5.0X26 (Screw) ×2 IMPLANT
SCREW BONE RSP LOCKING 5.0X32 (Screw) ×4 IMPLANT
SCREW RETAIN W/HEAD 4MM OFFSET (Shoulder) ×1 IMPLANT
SET HNDPC FAN SPRY TIP SCT (DISPOSABLE) ×1 IMPLANT
SLING ARM FOAM STRAP LRG (SOFTGOODS) ×2 IMPLANT
SLING ARM FOAM STRAP MED (SOFTGOODS) IMPLANT
SMARTMIX MINI TOWER (MISCELLANEOUS) ×2
SPONGE LAP 18X18 X RAY DECT (DISPOSABLE) ×2 IMPLANT
SPONGE LAP 4X18 RFD (DISPOSABLE) IMPLANT
STEM HUMERAL REV SHL 6X108 SM (Stem) ×1 IMPLANT
STRIP CLOSURE SKIN 1/2X4 (GAUZE/BANDAGES/DRESSINGS) ×2 IMPLANT
SUCTION FRAZIER HANDLE 10FR (MISCELLANEOUS) ×1
SUCTION TUBE FRAZIER 10FR DISP (MISCELLANEOUS) ×1 IMPLANT
SUPPORT WRAP ARM LG (MISCELLANEOUS) ×2 IMPLANT
SUT ETHIBOND NAB CT1 #1 30IN (SUTURE) ×6 IMPLANT
SUT FIBERWIRE #2 38 T-5 BLUE (SUTURE)
SUT MNCRL AB 4-0 PS2 18 (SUTURE) ×2 IMPLANT
SUT VIC AB 2-0 CT1 27 (SUTURE) ×2
SUT VIC AB 2-0 CT1 TAPERPNT 27 (SUTURE) ×1 IMPLANT
SUTURE FIBERWR #2 38 T-5 BLUE (SUTURE) IMPLANT
TAPE LABRALWHITE 1.5X36 (TAPE) ×2 IMPLANT
TAPE SUT LABRALTAP WHT/BLK (SUTURE) ×2 IMPLANT
TOWEL OR 17X26 10 PK STRL BLUE (TOWEL DISPOSABLE) ×2 IMPLANT
TOWER SMARTMIX MINI (MISCELLANEOUS) ×1 IMPLANT

## 2018-10-02 NOTE — Discharge Instructions (Signed)
Discharge Instructions after Reverse Total Shoulder Arthroplasty ° ° °A sling has been provided for you. You are to where this at all times, even while sleeping, until your first post operative visit with Dr. Synethia Endicott. °Use ice on the shoulder intermittently over the first 48 hours after surgery.  °Pain medicine has been prescribed for you.  °Use your medicine liberally over the first 48 hours, and then you can begin to taper your use. You may take Extra Strength Tylenol or Tylenol only in place of the pain pills. DO NOT take ANY nonsteroidal anti-inflammatory pain medications: Advil, Motrin, Ibuprofen, Aleve, Naproxen or Naprosyn.  °Take one aspirin a day for 2 weeks after surgery, unless you have an aspirin sensitivity/allergy or asthma.  °Leave your dressing on until your first follow up visit.  You may shower with the dressing.  Hold your arm as if you still have your sling on while you shower. °Simply allow the water to wash over the site and then pat dry. Make sure your axilla (armpit) is completely dry after showering. ° ° ° °Please call [phone] during normal business hours or [phone] after hours for any problems. Including the following: ° °- excessive redness of the incisions °- drainage for more than 4 days °- fever of more than 101.5 F ° °*Please note that pain medications will not be refilled after hours or on weekends. ° ° ° ° °

## 2018-10-02 NOTE — Transfer of Care (Signed)
Immediate Anesthesia Transfer of Care Note  Patient: [name]  Procedure(s) Performed: REVERSE TOTAL SHOULDER (Right Shoulder)  Patient Location: PACU  Anesthesia Type:General and GA combined with regional for post-op pain  Level of Consciousness: drowsy and patient cooperative  Airway & Oxygen Therapy: Patient Spontanous Breathing  Post-op Assessment: Report given to RN, Post -op Vital signs reviewed and stable and Patient moving all extremities X 4  Post vital signs: Reviewed and stable  Last Vitals:  Vitals Value Taken Time  BP 145/58 10/02/2018  9:35 AM  Temp    Pulse 72 10/02/2018  9:34 AM  Resp 15 10/02/2018  9:34 AM  SpO2 92 % 10/02/2018  9:34 AM  Vitals shown include unvalidated device data.  Last Pain:  Vitals:   10/02/18 0640  TempSrc: Oral  PainSc:          Complications: No apparent anesthesia complications

## 2018-10-02 NOTE — Anesthesia Postprocedure Evaluation (Signed)
Anesthesia Post Note  Patient: [name]  Procedure(s) Performed: REVERSE TOTAL SHOULDER (Right Shoulder)     Patient location during evaluation: PACU Anesthesia Type: General Level of consciousness: awake and alert Pain management: pain level controlled Vital Signs Assessment: post-procedure vital signs reviewed and stable Respiratory status: spontaneous breathing, nonlabored ventilation and respiratory function stable Cardiovascular status: blood pressure returned to baseline and stable Postop Assessment: no apparent nausea or vomiting Anesthetic complications: no    Last Vitals:  Vitals:   10/02/18 0725 10/02/18 0935  BP:    Pulse: 62   Resp: 11   Temp:  (!) 36.4 C  SpO2: 99%     Last Pain:  Vitals:   10/02/18 1000  TempSrc:   PainSc: 0-No pain                 Lidia Collum

## 2018-10-02 NOTE — Anesthesia Preprocedure Evaluation (Addendum)
Anesthesia Evaluation  Patient identified by MRN, date of birth, ID band Patient awake    Reviewed: Allergy & Precautions, NPO status , Patient's Chart, lab work & pertinent test results, reviewed documented beta blocker date and time   History of Anesthesia Complications Negative for: history of anesthetic complications  Airway Mallampati: II  TM Distance: >3 FB Neck ROM: Full    Dental  (+) Edentulous Upper, Edentulous Lower   Pulmonary asthma ,    Pulmonary exam normal breath sounds clear to auscultation       Cardiovascular hypertension, Pt. on medications and Pt. on home beta blockers Normal cardiovascular exam Rhythm:Regular Rate:Normal  RBBB   Neuro/Psych PSYCHIATRIC DISORDERS Anxiety Depression negative neurological ROS     GI/Hepatic Neg liver ROS, GERD  Controlled,  Endo/Other  negative endocrine ROS  Renal/GU Renal InsufficiencyRenal disease (Cr 1.21)  negative genitourinary   Musculoskeletal  (+) Arthritis ,   Abdominal   Peds  Hematology negative hematology ROS (+)   Anesthesia Other Findings 85 yo F for reverse TSA  Reproductive/Obstetrics                            Anesthesia Physical Anesthesia Plan  ASA: III  Anesthesia Plan: General   Post-op Pain Management: GA combined w/ Regional for post-op pain   Induction: Intravenous  PONV Risk Score and Plan: 3 and Ondansetron, Dexamethasone and Treatment may vary due to age or medical condition  Airway Management Planned: Oral ETT  Additional Equipment: None  Intra-op Plan:   Post-operative Plan: Extubation in OR  Informed Consent: I have reviewed the patients History and Physical, chart, labs and discussed the procedure including the risks, benefits and alternatives for the proposed anesthesia with the patient or authorized representative who has indicated his/her understanding and acceptance.   Dental advisory  given  Plan Discussed with:   Anesthesia Plan Comments:        Anesthesia Quick Evaluation

## 2018-10-02 NOTE — Anesthesia Procedure Notes (Signed)
Anesthesia Regional Block: Interscalene brachial plexus block   Pre-Anesthetic Checklist: ,, timeout performed, Correct Patient, Correct Site, Correct Laterality, Correct Procedure, Correct Position, site marked, Risks and benefits discussed,  Surgical consent,  Pre-op evaluation,  At surgeon's request and post-op pain management  Laterality: Right  Prep: chloraprep       Needles:  Injection technique: Single-shot  Needle Type: Echogenic Stimulator Needle     Needle Length: 9cm  Needle Gauge: 21     Additional Needles:   Procedures:,,,, ultrasound used (permanent image in chart),,,,  Narrative:  Start time: 10/02/2018 7:15 AM End time: 10/02/2018 7:22 AM Injection made incrementally with aspirations every 5 mL.  Performed by: Personally  Anesthesiologist: Lidia Collum, MD  Additional Notes: Monitors applied. Injection made in 5cc increments. No resistance to injection. Good needle visualization. Patient tolerated procedure well.

## 2018-10-02 NOTE — Anesthesia Procedure Notes (Signed)
Procedure Name: Intubation Date/Time: 10/02/2018 7:51 AM Performed by: Julieta Bellini, CRNA Pre-anesthesia Checklist: Patient identified, Emergency Drugs available, Suction available and Patient being monitored Patient Re-evaluated:Patient Re-evaluated prior to induction Preoxygenation: Pre-oxygenation with 100% oxygen Induction Type: IV induction Ventilation: Mask ventilation without difficulty Laryngoscope Size: Mac and 3 Grade View: Grade I Tube type: Oral Tube size: 7.0 mm Number of attempts: 1 Airway Equipment and Method: Stylet Placement Confirmation: ETT inserted through vocal cords under direct vision,  positive ETCO2 and breath sounds checked- equal and bilateral Secured at: 21 cm Tube secured with: Tape Dental Injury: Teeth and Oropharynx as per pre-operative assessment

## 2018-10-02 NOTE — H&P (Signed)
[name] is an 85 y.o. female.   Chief Complaint: R shoulder pain and dysfunction. HPI: Endstage R shoulder arthritis with rotator cuff tear arthropathy, significant pain and dysfunction, failed conservative measures.  Pain interferes with sleep and quality of life.   Past Medical History:  Diagnosis Date  . Anxiety   . Arthritis   . Asthma   . Cancer ([place])    SKIN CA OF FACE  . Depression   . Gallstones   . GERD (gastroesophageal reflux disease)   . Hypertension   . Pre-diabetes   . Prosthetic joint infection [facility])     Past Surgical History:  Procedure Laterality Date  .  ARM FRACTURE     LEFT  . CHOLECYSTECTOMY    . DILATION AND CURETTAGE OF UTERUS    . IRRIGATION AND DEBRIDEMENT KNEE  09/12/2011   Procedure: IRRIGATION AND DEBRIDEMENT KNEE;  Surgeon: Kerin Salen;  Location: [facility];  Service: Orthopedics;  Laterality: Right;  I&D RIGHT TKA REVISE BEARINGS/MBT. Poly exchange  . REPLACEMENT TOTAL KNEE    . TOTAL HIP ARTHROPLASTY    . TOTAL HIP ARTHROPLASTY Right 01/20/2018   Procedure: TOTAL HIP ARTHROPLASTY WITH REMOVAL OF INTRAMEDULLARY NAIL;  Surgeon: Frederik Pear, MD;  Location: [facility];  Service: Orthopedics;  Laterality: Right;    Family History  Problem Relation Age of Onset  . Diabetes Mellitus II Daughter    Social History:  reports that she has never smoked. She has never used smokeless tobacco. She reports that she does not drink alcohol or use drugs.  Allergies:  Allergies  Allergen Reactions  . Lisinopril Swelling    angioedema  . Hydrochlorothiazide Other (See Comments)    UNSPECIFIED REACTION   . Lipitor [Atorvastatin Calcium] Other (See Comments)    UNSPECIFIED REACTION   . Thiazide-Type Diuretics Other (See Comments)    UNSPECIFIED REACTION   . Zocor [Simvastatin] Swelling    SWELLING REACTION UNSPECIFIED   . Codeine Nausea Only    Medications Prior to Admission  Medication Sig Dispense Refill  . albuterol (PROVENTIL HFA;VENTOLIN HFA)  108 (90 BASE) MCG/ACT inhaler Inhale 2 puffs into the lungs every 6 (six) hours as needed. For shortness of breath     . ALPRAZolam (XANAX) 0.5 MG tablet Take 0.5 mg by mouth at bedtime as needed. For anxiety     . amLODipine (NORVASC) 10 MG tablet Take 10 mg by mouth daily.      Marland Kitchen aspirin EC 325 MG tablet Take 1 tablet (325 mg total) by mouth 2 (two) times daily. 30 tablet 0  . atenolol (TENORMIN) 50 MG tablet Take 1.5 tablets (75 mg total) by mouth daily. 60 tablet 0  . CELEBREX 200 MG capsule Take 200 mg by mouth 2 (two) times daily.     . cloNIDine (CATAPRES) 0.2 MG tablet Take 1 tablet (0.2 mg total) by mouth 2 (two) times daily. 60 tablet 1  . esomeprazole (NEXIUM) 40 MG capsule Take 40 mg by mouth daily before breakfast.      . fluticasone (FLOVENT HFA) 110 MCG/ACT inhaler Inhale 1 puff into the lungs 2 (two) times daily.      . furosemide (LASIX) 40 MG tablet Take 1 tablet (40 mg total) by mouth daily.    Marland Kitchen gabapentin (NEURONTIN) 300 MG capsule Take 1 capsule (300 mg total) by mouth 3 (three) times daily. 90 capsule 1  . zolpidem (AMBIEN) 5 MG tablet     . BESIVANCE 0.6 % SUSP     .  DUREZOL 0.05 % EMUL     . morphine (MSIR) 15 MG tablet Take 1 tablet (15 mg total) by mouth every 4 (four) hours as needed for moderate pain. 30 tablet 0  . PATADAY 0.2 % SOLN     . tiZANidine (ZANAFLEX) 2 MG tablet Take 1 tablet (2 mg total) by mouth every 6 (six) hours as needed. 60 tablet 0    Results for orders placed or performed during the hospital encounter of 09/30/18 (from the past 48 hour(s))  Urinalysis, Routine w reflex microscopic     Status: Abnormal   Collection Time: 09/30/18 10:30 AM  Result Value Ref Range   Color, Urine STRAW (A) YELLOW   APPearance CLEAR CLEAR   Specific Gravity, Urine 1.009 1.005 - 1.030   pH 5.0 5.0 - 8.0   Glucose, UA NEGATIVE NEGATIVE mg/dL   Hgb urine dipstick NEGATIVE NEGATIVE   Bilirubin Urine NEGATIVE NEGATIVE   Ketones, ur NEGATIVE NEGATIVE mg/dL    Protein, ur NEGATIVE NEGATIVE mg/dL   Nitrite NEGATIVE NEGATIVE   Leukocytes, UA TRACE (A) NEGATIVE   RBC / HPF 0-5 0 - 5 RBC/hpf   WBC, UA 0-5 0 - 5 WBC/hpf   Bacteria, UA RARE (A) NONE SEEN   Squamous Epithelial / LPF 0-5 0 - 5    Comment: Performed at [facility], [address] [place], [place] [address]  Surgical pcr screen     Status: Abnormal   Collection Time: 09/30/18 10:32 AM  Result Value Ref Range   MRSA, PCR NEGATIVE NEGATIVE   Staphylococcus aureus POSITIVE (A) NEGATIVE    Comment: (NOTE) The Xpert SA Assay (FDA approved for NASAL specimens in patients 42 years of age and older), is one component of a comprehensive surveillance program. It is not intended to diagnose infection nor to guide or monitor treatment. Performed at [facility], [place] [address], [place], [place] [address]   APTT     Status: None   Collection Time: 09/30/18 10:34 AM  Result Value Ref Range   aPTT 28 24 - 36 seconds    Comment: Performed at [facility] [address] [place], [place] [address]  CBC WITH DIFFERENTIAL     Status: None   Collection Time: 09/30/18 10:34 AM  Result Value Ref Range   WBC 10.4 4.0 - 10.5 K/uL   RBC 4.15 3.87 - 5.11 MIL/uL   Hemoglobin 12.6 12.0 - 15.0 g/dL   HCT 40.4 36.0 - 46.0 %   MCV 97.3 80.0 - 100.0 fL   MCH 30.4 26.0 - 34.0 pg   MCHC 31.2 30.0 - 36.0 g/dL   RDW 15.4 11.5 - 15.5 %   Platelets 246 150 - 400 K/uL   nRBC 0.0 0.0 - 0.2 %   Neutrophils Relative % 72 %   Neutro Abs 7.4 1.7 - 7.7 K/uL   Lymphocytes Relative 18 %   Lymphs Abs 1.9 0.7 - 4.0 K/uL   Monocytes Relative 6 %   Monocytes Absolute 0.6 0.1 - 1.0 K/uL   Eosinophils Relative 4 %   Eosinophils Absolute 0.4 0.0 - 0.5 K/uL   Basophils Relative 0 %   Basophils Absolute 0.0 0.0 - 0.1 K/uL   Immature Granulocytes 0 %   Abs Immature Granulocytes 0.03 0.00 - 0.07 K/uL    Comment: Performed at [facility], [address] [place], [place] [address]   Comprehensive metabolic panel     Status: Abnormal  Collection Time: 09/30/18 10:34 AM  Result Value Ref Range   Sodium 140 135 - 145 mmol/L   Potassium 4.6 3.5 - 5.1 mmol/L   Chloride 111 98 - 111 mmol/L   CO2 22 22 - 32 mmol/L   Glucose, Bld 102 (H) 70 - 99 mg/dL   BUN 35 (H) 8 - 23 mg/dL   Creatinine, Ser 1.21 (H) 0.44 - 1.00 mg/dL   Calcium 9.2 8.9 - 10.3 mg/dL   Total Protein 6.8 6.5 - 8.1 g/dL   Albumin 3.9 3.5 - 5.0 g/dL   AST 13 (L) 15 - 41 U/L   ALT 13 0 - 44 U/L   Alkaline Phosphatase 50 38 - 126 U/L   Total Bilirubin 0.5 0.3 - 1.2 mg/dL   GFR calc non Af Amer 41 (L) >60 mL/min   GFR calc Af Amer 48 (L) >60 mL/min   Anion gap 7 5 - 15    Comment: Performed at [facility] [address] [place], [place] [address]  Protime-INR     Status: None   Collection Time: 09/30/18 10:34 AM  Result Value Ref Range   Prothrombin Time 12.8 11.4 - 15.2 seconds   INR 0.97     Comment: Performed at [facility] [address] [place], [place] [address]  Type and screen Order type and screen if day of surgery is less than 15 days from draw of preadmission visit or order morning of surgery if day of surgery is greater than 6 days from preadmission visit.     Status: None   Collection Time: 09/30/18 10:46 AM  Result Value Ref Range   ABO/RH(D) A POS    Antibody Screen NEG    Sample Expiration 10/14/2018    Extend sample reason      NO TRANSFUSIONS OR PREGNANCY IN THE PAST 3 MONTHS Performed at [facility], [place] [address], [place], [place] [address]    No results found.  Review of Systems  All other systems reviewed and are negative.   Blood pressure (!) 155/77, pulse 74, temperature 98.6 F (37 C), temperature source Oral, resp. rate 18, height 5\' 2"  (1.575 m), weight 73.9 kg, SpO2 98 %. Physical Exam  Constitutional: She is oriented to person, place, and time. She appears well-developed and well-nourished.  HENT:  Head: Atraumatic.  Eyes: EOM are  normal.  Cardiovascular: Intact distal pulses.  Respiratory: Effort normal.  Musculoskeletal:     Comments: R shoulder pain with limited ROM. NVID  Neurological: She is alert and oriented to person, place, and time.  Skin: Skin is warm and dry.  Psychiatric: She has a normal mood and affect.     Assessment/Plan R endstage severe rotator cuff tear arthropathy Plan R reverse TSA Risks / benefits of surgery discussed Consent on chart  NPO for OR Preop antibiotics   Isabella Stalling, MD 10/02/2018, 7:13 AM

## 2018-10-02 NOTE — Plan of Care (Signed)

## 2018-10-02 NOTE — Op Note (Signed)
Procedure(s): REVERSE TOTAL SHOULDER Procedure Note  [name] female 85 y.o. 10/02/2018  Preoperative diagnosis: Right rotator cuff tear arthropathy  Postoperative diagnosis: Same  Procedure(s) and Anesthesia Type:    * RIGHT REVERSE TOTAL SHOULDER - General   Indications:  85 y.o. female  With endstage right shoulder arthritis with irrepairable rotator cuff tear. Pain and dysfunction interfered with quality of life and nonoperative treatment with activity modification, NSAIDS and injections failed.     Surgeon: Isabella Stalling   Assistants: Joanell Rising, PA-C.  [name] was scrubbed and present throughout the procedure and was essential for retraction positioning, and closure.  Anesthesia: General endotracheal anesthesia with preoperative interscalene block given by the attending anesthesiologist    Procedure Detail  REVERSE TOTAL SHOULDER   Estimated Blood Loss:  200 mL         Drains: none  Blood Given: none          Specimens: none        Complications:  * No complications entered in OR log *         Disposition: PACU - hemodynamically stable.         Condition: stable      OPERATIVE FINDINGS:  A DJO Altivate pressfit reverse total shoulder arthroplasty was placed with a  size 6 stem, a 32-4 glenosphere, and a +4-mm poly insert. The base plate  fixation was excellent.  PROCEDURE: The patient was identified in the preoperative holding area  where I personally marked the operative site after verifying site, side,  and procedure with the patient. An interscalene block given by  the attending anesthesiologist in the holding area and the patient was taken back to the operating room where all extremities were  carefully padded in position after general anesthesia was induced. She  was placed in a beach-chair position and the operative upper extremity was  prepped and draped in a standard sterile fashion. The patient received 1 g IV tranexamic acid at the  start of the case around time of the incision. An approximately 10-  cm incision was made from the tip of the coracoid process to the center  point of the humerus at the level of the axilla. Dissection was carried  down through subcutaneous tissues to the level of the cephalic vein  which was taken laterally with the deltoid. The pectoralis major was  retracted medially. The subdeltoid space was developed and the lateral  edge of the conjoined tendon was identified. The undersurface of  conjoined tendon was palpated and the musculocutaneous nerve was not in  the field. Retractor was placed underneath the conjoined and second  retractor was placed lateral into the deltoid. The circumflex humeral  artery and vessels were identified and clamped and coagulated. The  biceps tendon was absent.  The subscapularis was taken down as a peel with the capsule.  The  joint was then gently externally rotated while the capsule was released  from the humeral neck around to just beyond the 6 o'clock position. At  this point, the joint was dislocated and the humeral head was presented  into the wound. The excessive osteophyte formation was removed with a  large rongeur.  The cutting guide was used to make the appropriate  head cut and the head was saved for potentially bone grafting.  The glenoid was exposed with the arm in an  abducted extended position. The anterior and posterior labrum were  completely excised and the capsule was released circumferentially to  allow for exposure of the glenoid for preparation. The 2.5 mm drill was  placed using the guide in 5-10 inferior angulation and the tap was then advanced in the same hole. Small and large reamers were then used. The tap was then removed and the Metaglene was then screwed in with excellent purchase.  The peripheral guide was then used to drilled measured and filled peripheral locking screws. The size 32-4 glenosphere was then impacted on the [facility] taper  and the central screw was placed. The humerus was then again exposed and the diaphyseal reamers were used followed by the metaphyseal reamers. The final broach was left in place in the proximal trial was placed. The joint was reduced and with this implant it was felt that soft tissue tensioning was appropriate with excellent stability and excellent range of motion. Therefore, final humeral stem was placed press-fit.  And then the trial polyethylene inserts were tested again and the above implant was felt to be the most appropriate for final insertion. The joint was reduced taken through full range of motion and felt to be stable. Soft tissue tension was appropriate.  The joint was then copiously irrigated with pulse  lavage and the wound was then closed. The subscapularis not repaired.  Skin was closed with 2-0 Vicryl in a deep dermal layer and 4-0  Monocryl for skin closure. Steri-Strips were applied. Sterile  dressings were then applied as well as a sling. The patient was allowed  to awaken from general anesthesia, transferred to stretcher, and taken  to recovery room in stable condition.   POSTOPERATIVE PLAN: The patient will be kept in the hospital postoperatively  for pain control and therapy.

## 2018-10-03 ENCOUNTER — Other Ambulatory Visit: Payer: Self-pay

## 2018-10-03 ENCOUNTER — Encounter (HOSPITAL_COMMUNITY): Payer: Self-pay | Admitting: Orthopedic Surgery

## 2018-10-03 LAB — CBC
HCT: 34.4 % — ABNORMAL LOW (ref 36.0–46.0)
Hemoglobin: 11.1 g/dL — ABNORMAL LOW (ref 12.0–15.0)
MCH: 30.6 pg (ref 26.0–34.0)
MCHC: 32.3 g/dL (ref 30.0–36.0)
MCV: 94.8 fL (ref 80.0–100.0)
PLATELETS: 225 10*3/uL (ref 150–400)
RBC: 3.63 MIL/uL — ABNORMAL LOW (ref 3.87–5.11)
RDW: 15.1 % (ref 11.5–15.5)
WBC: 8.6 10*3/uL (ref 4.0–10.5)
nRBC: 0 % (ref 0.0–0.2)

## 2018-10-03 LAB — BASIC METABOLIC PANEL
Anion gap: 10 (ref 5–15)
BUN: 25 mg/dL — ABNORMAL HIGH (ref 8–23)
CALCIUM: 8.9 mg/dL (ref 8.9–10.3)
CO2: 23 mmol/L (ref 22–32)
Chloride: 108 mmol/L (ref 98–111)
Creatinine, Ser: 1.13 mg/dL — ABNORMAL HIGH (ref 0.44–1.00)
GFR calc Af Amer: 52 mL/min — ABNORMAL LOW (ref >60)
GFR calc non Af Amer: 45 mL/min — ABNORMAL LOW (ref >60)
Glucose, Bld: 177 mg/dL — ABNORMAL HIGH (ref 70–99)
Potassium: 5 mmol/L (ref 3.5–5.1)
Sodium: 141 mmol/L (ref 135–145)

## 2018-10-03 NOTE — Discharge Summary (Signed)
Patient ID: [name] MRN: [id number] DOB/AGE: 83-Apr-1933 57 y.o.  Admit date: 10/02/2018 Discharge date: 10/03/2018  Admission Diagnoses:  Active Problems:   S/P reverse total shoulder arthroplasty, right   Discharge Diagnoses:  Same  Past Medical History:  Diagnosis Date  . Anxiety   . Arthritis   . Asthma   . Cancer ([place])    SKIN CA OF FACE  . Depression   . Gallstones   . GERD (gastroesophageal reflux disease)   . Hypertension   . Pre-diabetes   . Prosthetic joint infection ([place])     Surgeries: Procedure(s): REVERSE TOTAL SHOULDER on 10/02/2018   Consultants:   Discharged Condition: Improved  Hospital Course: [name] is an 85 y.o. female who was admitted 10/02/2018 for operative treatment of Right shoulder rotator cuff tear arthropathy. Patient has severe unremitting pain that affects sleep, daily activities, and work/hobbies. After pre-op clearance the patient was taken to the operating room on 10/02/2018 and underwent  Procedure(s): REVERSE TOTAL SHOULDER.    Patient was given perioperative antibiotics:  Anti-infectives (From admission, onward)   Start     Dose/Rate Route Frequency Ordered Stop   10/02/18 1400  ceFAZolin (ANCEF) IVPB 1 g/50 mL premix     1 g 100 mL/hr over 30 Minutes Intravenous Every 6 hours 10/02/18 1059 10/02/18 2133   10/02/18 0615  ceFAZolin (ANCEF) IVPB 2g/100 mL premix     2 g 200 mL/hr over 30 Minutes Intravenous On call to O.R. 10/02/18 2831 10/02/18 0751       Patient was given sequential compression devices, early ambulation, and chemoprophylaxis to prevent DVT.  Patient benefited maximally from hospital stay and there were no complications.    Recent vital signs:  Patient Vitals for the past 24 hrs:  BP Temp Temp src Pulse Resp SpO2  10/03/18 0546 (!) 183/69 97.9 F (36.6 C) Oral 68 14 97 %  10/02/18 2223 (!) 170/59 97.7 F (36.5 C) Oral 67 16 100 %  10/02/18 1957 - - - - - 94 %  10/02/18 1420 (!) 168/58 98 F  (36.7 C) Oral 65 17 96 %  10/02/18 1058 (!) 132/53 97.8 F (36.6 C) Oral 60 18 94 %  10/02/18 1046 (!) 125/53 (!) 97.3 F (36.3 C) - 60 13 94 %  10/02/18 1045 - - - 65 17 95 %  10/02/18 1044 - - - 64 19 -  10/02/18 1035 - - - 62 18 90 %  10/02/18 1034 (!) 127/52 - - 62 14 94 %  10/02/18 1030 - (!) 97.3 F (36.3 C) - 62 15 94 %  10/02/18 1019 (!) 128/47 - - 61 15 94 %  10/02/18 1015 - - - 63 16 95 %  10/02/18 1004 (!) 127/53 - - 63 13 96 %  10/02/18 1000 - - - 61 14 93 %  10/02/18 0949 117/65 - - 64 15 94 %  10/02/18 0945 - - - 65 16 96 %  10/02/18 0935 (!) 145/58 (!) 97.5 F (36.4 C) - 70 15 93 %     Recent laboratory studies:  Recent Labs    09/30/18 1034 10/03/18 0205  WBC 10.4 8.6  HGB 12.6 11.1*  HCT 40.4 34.4*  PLT 246 225  NA 140 141  K 4.6 5.0  CL 111 108  CO2 22 23  BUN 35* 25*  CREATININE 1.21* 1.13*  GLUCOSE 102* 177*  INR 0.97  --   CALCIUM 9.2 8.9  Discharge Medications:     Diagnostic Studies: Dg Shoulder Right Port  Result Date: 10/02/2018 CLINICAL DATA:  Status post reversed total shoulder arthroplasty EXAM: PORTABLE RIGHT SHOULDER: 1 V COMPARISON:  None. FINDINGS: Frontal view obtained. Patient is status post total shoulder replacement with prosthetic components well-seated on single frontal view. Bones are osteoporotic. There is moderate osteoarthritic change in the acromioclavicular joint. No acute fracture or dislocation. Visualized right lung is clear. IMPRESSION: Total shoulder replacement on the right with prosthetic components well-seated on frontal view. Moderate arthropathy in the acromioclavicular joint. There is generalized osteoporosis. No acute fracture or dislocation evident. Electronically Signed   By: Lowella Grip III M.D.   On: 10/02/2018 13:52    Disposition:     Follow-up Information    Tania Ade, MD. Schedule an appointment as soon as possible for a visit in 2 weeks.   Specialty:  Orthopedic Surgery Contact  information: [address] [place] [address] [phone]            Signed: Isabella Stalling 10/03/2018, 7:52 AM

## 2018-10-03 NOTE — Plan of Care (Signed)
  Problem: Education: Goal: Knowledge of General Education information will improve Description Including pain rating scale, medication(s)/side effects and non-pharmacologic comfort measures Outcome: Progressing   Problem: Clinical Measurements: Goal: Ability to maintain clinical measurements within normal limits will improve Outcome: Progressing Goal: Will remain free from infection Outcome: Progressing Goal: Diagnostic test results will improve Outcome: Progressing Goal: Respiratory complications will improve Outcome: Not Applicable Goal: Cardiovascular complication will be avoided Outcome: Progressing

## 2018-10-03 NOTE — Progress Notes (Signed)
OT evaluation and Discharge:  Clinical Impression: PTA Pt independent in ADL and mobility. Pt educated on sling management and positioning, safety for bathing and arm positioning, sleeping positioning and safety wearing sling, sequence for dressing and smart clothing choices as well as exercises for the hand, wrist, and elbow. OT handout provided and reviewed in full. Pt verbalized understanding. Block still largely in place and Pt required cues for no shoulder movement (especially with block in place). Education complete. No further OT as Pt is set to dc today. No family present- however handout is in the room. If the family has questions feel free to contact OT.     10/03/18 0800  OT Visit Information  Last OT Received On 10/03/18  Assistance Needed +1  History of Present Illness Pt is 85 y/o female S/P reverse total shoulder arthroplasty, right. Pt with PMH including Anxiety, Arthritis, Asthma, Cancer, Depression, HTN, Pre-diabetes, and Prosthetic joint infection, Replacement total knee; Total hip arthroplasty (Right, 01/20/2018).   Precautions  Precautions Shoulder  Type of Shoulder Precautions conservative  Shoulder Interventions Shoulder sling/immobilizer;At all times;Off for dressing/bathing/exercises  Precaution Booklet Issued Yes (comment)  Precaution Comments OT shoulder DC handout reviewed in full  Required Braces or Orthoses Sling  Restrictions  Weight Bearing Restrictions Yes  RUE Weight Bearing NWB  Home Living  Family/patient expects to be discharged to: Private residence  Living Arrangements Children;Other relatives (grandson, and she watches 3 other grandkids during the day)  Available Help at Discharge Family;Available 24 hours/day  Type of Home Mobile home  Home Access Stairs to enter  Entrance Stairs-Number of Steps 4  Entrance Stairs-Rails Left  Home Layout One level  Bathroom Shower/Tub Walk-in shower;Tub/shower Restaurant manager, fast food  Prior Function   Level of Independence Independent with assistive device(s)  Comments uses a SPC for long distances  Communication  Communication No difficulties  Pain Assessment  Pain Assessment 0-10  Pain Score 1  Pain Location R shoulder  Pain Descriptors / Indicators Dull  Pain Intervention(s) Monitored during session;Repositioned  Cognition  Arousal/Alertness Awake/alert  Behavior During Therapy WFL for tasks assessed/performed  Overall Cognitive Status No family/caregiver present to determine baseline cognitive functioning  General Comments Pt required mod cues to not move shoulder, renforce precautions  Upper Extremity Assessment  Upper Extremity Assessment RUE deficits/detail  RUE Deficits / Details block still largely in place, post-op deficits as anticipated  RUE Sensation decreased light touch  RUE Coordination decreased fine motor;decreased gross motor  ADL  General ADL Comments see shoulder section below  Bed Mobility  Overal bed mobility Modified Independent  Transfers  Overall transfer level Modified independent  Balance  Overall balance assessment Mild deficits observed, not formally tested  Exercises  Exercises Shoulder  Shoulder Instructions  Donning/doffing shirt without moving shoulder Maximal assistance  Method for sponge bathing under operated UE Minimal assistance  Donning/doffing sling/immobilizer Maximal assistance  Correct positioning of sling/immobilizer Maximal assistance  ROM for elbow, wrist and digits of operated UE Minimal assistance  Sling wearing schedule (on at all times/off for ADL's) Supervision/safety  Proper positioning of operated UE when showering Supervision/safety  Positioning of UE while sleeping Minimal assistance  Shoulder Exercises  Elbow Flexion AROM;Right;Seated  Elbow Extension AROM;Right;Seated  Wrist Flexion AROM;Right;Seated  Wrist Extension AROM;Right;Seated  Digit Composite Flexion AROM;Right  Composite Extension AROM;Right  Neck  Flexion AROM  Neck Extension AROM  Neck Lateral Flexion - Right AROM  Neck Lateral Flexion - Left AROM  OT - End of Session  Equipment Utilized During Treatment Other (comment) (sling)  Activity Tolerance Patient tolerated treatment well  Patient left in chair;with call bell/phone within reach;with family/visitor present  Nurse Communication Mobility status;Weight bearing status  OT Assessment  OT Recommendation/Assessment Progress rehab of shoulder as ordered by MD at follow-up appointment  OT Visit Diagnosis Pain  Pain - Right/Left Right  Pain - part of body Shoulder  OT Problem List Decreased range of motion;Decreased activity tolerance;Impaired sensation;Decreased knowledge of precautions;Impaired UE functional use;Pain  AM-PAC OT "6 Clicks" Daily Activity Outcome Measure (Version 2)  Help from another person eating meals? 3  Help from another person taking care of personal grooming? 3  Help from another person toileting, which includes using toliet, bedpan, or urinal? 3  Help from another person bathing (including washing, rinsing, drying)? 2  Help from another person to put on and taking off regular upper body clothing? 2  Help from another person to put on and taking off regular lower body clothing? 2  6 Click Score 15  OT Recommendation  Follow Up Recommendations Follow surgeon's recommendation for DC plan and follow-up therapies  OT Equipment None recommended by OT (Pt has appropriate DME)  Acute Rehab OT Goals  Patient Stated Goal "get home, get better"  OT Goal Formulation With patient  Time For Goal Achievement 10/17/18  Potential to Achieve Goals Good  OT Time Calculation  OT Start Time (ACUTE ONLY) 1101  OT Stop Time (ACUTE ONLY) 1129  OT Time Calculation (min) 28 min  OT General Charges  $OT Visit 1 Visit  OT Evaluation  $OT Eval Moderate Complexity 1 Mod  OT Treatments  $Self Care/Home Management  8-22 mins  Written Expression  Dominant Hand Right   Hulda Humphrey OTR/L Acute Rehabilitation Services Pager: [phone] Office: [phone]

## 2018-10-03 NOTE — Progress Notes (Signed)
   PATIENT ID: [name]   1 Day Post-Op Procedure(s) (LRB): REVERSE TOTAL SHOULDER (Right)  Subjective: Doing very well, minimal pain.  [name] to go home.  Objective:  Vitals:   10/02/18 2223 10/03/18 0546  BP: (!) 170/59 (!) 183/69  Pulse: 67 68  Resp: 16 14  Temp: 97.7 F (36.5 C) 97.9 F (36.6 C)  SpO2: 100% 97%     Right shoulder dressing clean dry and intact.  Distally neurovascularly intact.  Her sling is intact.  Labs:  Recent Labs    09/30/18 1034 10/03/18 0205  HGB 12.6 11.1*   Recent Labs    09/30/18 1034 10/03/18 0205  WBC 10.4 8.6  RBC 4.15 3.63*  HCT 40.4 34.4*  PLT 246 225   Recent Labs    09/30/18 1034 10/03/18 0205  NA 140 141  K 4.6 5.0  CL 111 108  CO2 22 23  BUN 35* 25*  CREATININE 1.21* 1.13*  GLUCOSE 102* 177*  CALCIUM 9.2 8.9    Assessment and Plan:Doing well postop day 1 She will see a therapist today and discharge home mid morning. We will follow-up in 2 weeks.  Precautions were reviewed.  VTE proph: Aspirin

## 2019-06-25 ENCOUNTER — Other Ambulatory Visit: Payer: Self-pay | Admitting: Orthopedic Surgery

## 2019-07-07 ENCOUNTER — Encounter (HOSPITAL_COMMUNITY): Payer: Self-pay

## 2019-07-07 NOTE — Patient Instructions (Addendum)
DUE TO COVID-19 ONLY ONE VISITOR IS ALLOWED TO COME WITH YOU AND STAY IN THE WAITING ROOM ONLY DURING PRE OP AND PROCEDURE DAY OF SURGERY. THE 1 VISITOR MAY VISIT WITH YOU AFTER SURGERY IN YOUR PRIVATE ROOM DURING VISITING HOURS ONLY!  YOU NEED TO HAVE A COVID 19 TEST ON_______ @_______ , THIS TEST MUST BE DONE BEFORE SURGERY, COME  [place], [place] , [address].  ([place]) ONCE YOUR COVID TEST IS COMPLETED, PLEASE BEGIN THE QUARANTINE INSTRUCTIONS AS OUTLINED IN YOUR HANDOUT.                [name]  07/07/2019   Your procedure is scheduled on: 07-13-19   Report to [facility] Main  Entrance   Report to admitting at       0955 AM     Call this number if you have problems the morning of surgery [phone]    Remember: NO SOLID FOOD AFTER MIDNIGHT THE NIGHT PRIOR TO SURGERY. NOTHING BY MOUTH EXCEPT CLEAR LIQUIDS UNTIL    0925 am . PLEASE FINISH   Gatorade  DRINK PER SURGEON ORDER  WHICH NEEDS TO BE COMPLETED AT     925 am then nothing by mouth .    CLEAR LIQUID DIET   Foods Allowed                                                                     Foods Excluded  Coffee and tea, regular and decaf                             liquids that you cannot  Plain Jell-O any favor except red or purple                                           see through such as: Fruit ices (not with fruit pulp)                                     milk, soups, orange juice  Iced Popsicles                                    All solid food Carbonated beverages, regular and diet                                    Cranberry, grape and apple juices Sports drinks like Gatorade Lightly seasoned clear broth or consume(fat free) Sugar, honey syrup   _____________________________________________________________________   BRUSH YOUR TEETH MORNING OF SURGERY AND RINSE YOUR MOUTH OUT, NO CHEWING GUM CANDY OR MINTS.     Take these medicines the morning of surgery with  A SIP OF WATER: Gabapentin, Nexium, clonodine, amlodipine, hydrocodone if needed or tylenol , Atenelol as directed by PCP  You may not have any metal on your body including hair pins and              piercings  Do not wear jewelry, make-up, lotions, powders or perfumes, deodorant             Do not wear nail polish on your fingernails.  Do not shave  48 hours prior to surgery.     Do not bring valuables to the hospital. [place].  Contacts, dentures or bridgework may not be worn into surgery.               Please read over the following fact sheets you were given: _____________________________________________________________________          [facility] - Preparing for Surgery Before surgery, you can play an important role.  Because skin is not sterile, your skin needs to be as free of germs as possible.  You can reduce the number of germs on your skin by washing with CHG (chlorahexidine gluconate) soap before surgery.  CHG is an antiseptic cleaner which kills germs and bonds with the skin to continue killing germs even after washing. Please DO NOT use if you have an allergy to CHG or antibacterial soaps.  If your skin becomes reddened/irritated stop using the CHG and inform your nurse when you arrive at Short Stay. Do not shave (including legs and underarms) for at least 48 hours prior to the first CHG shower.  You may shave your face/neck. Please follow these instructions carefully:  1.  Shower with CHG Soap the night before surgery and the  morning of Surgery.  2.  If you choose to wash your hair, wash your hair first as usual with your  normal  shampoo.  3.  After you shampoo, rinse your hair and body thoroughly to remove the  shampoo.                           4.  Use CHG as you would any other liquid soap.  You can apply chg directly  to the skin and wash                       Gently with a scrungie or  clean washcloth.  5.  Apply the CHG Soap to your body ONLY FROM THE NECK DOWN.   Do not use on face/ open                           Wound or open sores. Avoid contact with eyes, ears mouth and genitals (private parts).                       Wash face,  Genitals (private parts) with your normal soap.             6.  Wash thoroughly, paying special attention to the area where your surgery  will be performed.  7.  Thoroughly rinse your body with warm water from the neck down.  8.  DO NOT shower/wash with your normal soap after using and rinsing off  the CHG Soap.                9.  Pat yourself dry with a clean towel.  10.  Wear clean pajamas.            11.  Place clean sheets on your bed the night of your first shower and do not  sleep with pets. Day of Surgery : Do not apply any lotions/deodorants the morning of surgery.  Please wear clean clothes to the hospital/surgery center.  FAILURE TO FOLLOW THESE INSTRUCTIONS MAY RESULT IN THE CANCELLATION OF YOUR SURGERY PATIENT SIGNATURE_________________________________  NURSE SIGNATURE__________________________________  ________________________________________________________________________   [name]  An incentive spirometer is a tool that can help keep your lungs clear and active. This tool measures how well you are filling your lungs with each breath. Taking long deep breaths may help reverse or decrease the chance of developing breathing (pulmonary) problems (especially infection) following:  A long period of time when you are unable to move or be active. BEFORE THE PROCEDURE   If the spirometer includes an indicator to show your best effort, your nurse or respiratory therapist will set it to a desired goal.  If possible, sit up straight or lean slightly forward. Try not to slouch.  Hold the incentive spirometer in an upright position. INSTRUCTIONS FOR USE  1. Sit on the edge of your bed if possible, or sit up as  far as you can in bed or on a chair. 2. Hold the incentive spirometer in an upright position. 3. Breathe out normally. 4. Place the mouthpiece in your mouth and seal your lips tightly around it. 5. Breathe in slowly and as deeply as possible, raising the piston or the ball toward the top of the column. 6. Hold your breath for 3-5 seconds or for as long as possible. Allow the piston or ball to fall to the bottom of the column. 7. Remove the mouthpiece from your mouth and breathe out normally. 8. Rest for a few seconds and repeat Steps 1 through 7 at least 10 times every 1-2 hours when you are awake. Take your time and take a few normal breaths between deep breaths. 9. The spirometer may include an indicator to show your best effort. Use the indicator as a goal to work toward during each repetition. 10. After each set of 10 deep breaths, practice coughing to be sure your lungs are clear. If you have an incision (the cut made at the time of surgery), support your incision when coughing by placing a pillow or rolled up towels firmly against it. Once you are able to get out of bed, walk around indoors and cough well. You may stop using the incentive spirometer when instructed by your caregiver.  RISKS AND COMPLICATIONS  Take your time so you do not get dizzy or light-headed.  If you are in pain, you may need to take or ask for pain medication before doing incentive spirometry. It is harder to take a deep breath if you are having pain. AFTER USE  Rest and breathe slowly and easily.  It can be helpful to keep track of a log of your progress. Your caregiver can provide you with a simple table to help with this. If you are using the spirometer at home, follow these instructions: [place] IF:   You are having difficultly using the spirometer.  You have trouble using the spirometer as often as instructed.  Your pain medication is not giving enough relief while using the spirometer.  You  develop fever of 100.5 F (38.1 C) or higher. SEEK IMMEDIATE MEDICAL CARE IF:   You cough up bloody  sputum that had not been present before.  You develop fever of 102 F (38.9 C) or greater.  You develop worsening pain at or near the incision site. MAKE SURE YOU:   Understand these instructions.  Will watch your condition.  Will get help right away if you are not doing well or get worse. Document Released: 01/21/2007 Document Revised: 12/03/2011 Document Reviewed: 03/24/2007 [facility] Patient Information 2014 [place], [place].   ________________________________________________________________________

## 2019-07-07 NOTE — Progress Notes (Signed)
PCP - Kip Corrington Cardiologist - Marina Goodell lov 04-13-19 care everywhere Chest x-ray - final 07-08-19 in epic  EKG 07-08-19   Final in epic Stress Test -  ECHO - 04-13-19 care everywhere Cardiac Cath -   Sleep Study -  CPAP -   Fasting Blood Sugar -  Checks Blood Sugar _____ times a day  Blood Thinner Instructions: Aspirin Instructions: Last Dose:  :  Anesthesia review   Creatine  Abnormal , Critical Potassium reported to [employer] PA-C, Abnormal EKG and HR as low as 33 while hooked to EKG monitor Konrad Felix   PA-C aware at preop    Patient denies shortness of breath, fever, cough and chest pain at PAT appointment   None    Patient verbalized understanding of instructions that were given to them at the PAT appointment. Patient was also instructed that they will need to review over the PAT instructions again at home before surgery.

## 2019-07-08 ENCOUNTER — Encounter (HOSPITAL_COMMUNITY): Payer: Self-pay | Admitting: Physician Assistant

## 2019-07-08 ENCOUNTER — Ambulatory Visit (HOSPITAL_COMMUNITY)
Admission: RE | Admit: 2019-07-08 | Discharge: 2019-07-08 | Disposition: A | Discharge status: Home / Self Care | Payer: Medicare Other | Source: Ambulatory Visit | Attending: Orthopedic Surgery | Admitting: Orthopedic Surgery

## 2019-07-08 ENCOUNTER — Encounter (HOSPITAL_COMMUNITY)
Admission: RE | Admit: 2019-07-08 | Discharge: 2019-07-08 | Disposition: A | Discharge status: Home / Self Care | Payer: Medicare Other | Source: Ambulatory Visit | Attending: Orthopedic Surgery | Admitting: Orthopedic Surgery

## 2019-07-08 ENCOUNTER — Other Ambulatory Visit: Payer: Self-pay

## 2019-07-08 ENCOUNTER — Encounter (HOSPITAL_COMMUNITY): Payer: Self-pay

## 2019-07-08 DIAGNOSIS — Z7982 Long term (current) use of aspirin: Secondary | ICD-10-CM | POA: Insufficient documentation

## 2019-07-08 DIAGNOSIS — Z96641 Presence of right artificial hip joint: Secondary | ICD-10-CM | POA: Diagnosis not present

## 2019-07-08 DIAGNOSIS — Z01818 Encounter for other preprocedural examination: Secondary | ICD-10-CM

## 2019-07-08 DIAGNOSIS — Z96651 Presence of right artificial knee joint: Secondary | ICD-10-CM | POA: Diagnosis not present

## 2019-07-08 DIAGNOSIS — J45909 Unspecified asthma, uncomplicated: Secondary | ICD-10-CM | POA: Insufficient documentation

## 2019-07-08 DIAGNOSIS — Z7951 Long term (current) use of inhaled steroids: Secondary | ICD-10-CM | POA: Insufficient documentation

## 2019-07-08 DIAGNOSIS — F329 Major depressive disorder, single episode, unspecified: Secondary | ICD-10-CM | POA: Diagnosis not present

## 2019-07-08 DIAGNOSIS — K219 Gastro-esophageal reflux disease without esophagitis: Secondary | ICD-10-CM | POA: Insufficient documentation

## 2019-07-08 DIAGNOSIS — E1122 Type 2 diabetes mellitus with diabetic chronic kidney disease: Secondary | ICD-10-CM | POA: Diagnosis not present

## 2019-07-08 DIAGNOSIS — I129 Hypertensive chronic kidney disease with stage 1 through stage 4 chronic kidney disease, or unspecified chronic kidney disease: Secondary | ICD-10-CM | POA: Insufficient documentation

## 2019-07-08 DIAGNOSIS — N189 Chronic kidney disease, unspecified: Secondary | ICD-10-CM | POA: Insufficient documentation

## 2019-07-08 DIAGNOSIS — F419 Anxiety disorder, unspecified: Secondary | ICD-10-CM | POA: Diagnosis not present

## 2019-07-08 DIAGNOSIS — Z96611 Presence of right artificial shoulder joint: Secondary | ICD-10-CM | POA: Diagnosis not present

## 2019-07-08 DIAGNOSIS — Z79899 Other long term (current) drug therapy: Secondary | ICD-10-CM | POA: Diagnosis not present

## 2019-07-08 DIAGNOSIS — M1712 Unilateral primary osteoarthritis, left knee: Secondary | ICD-10-CM | POA: Insufficient documentation

## 2019-07-08 HISTORY — DX: Type 2 diabetes mellitus without complications: E11.9

## 2019-07-08 HISTORY — DX: Chronic kidney disease, unspecified: N18.9

## 2019-07-08 HISTORY — DX: Pneumonia, unspecified organism: J18.9

## 2019-07-08 LAB — CBC WITH DIFFERENTIAL/PLATELET
Abs Immature Granulocytes: 0.03 10*3/uL (ref 0.00–0.07)
Basophils Absolute: 0 10*3/uL (ref 0.0–0.1)
Basophils Relative: 1 %
Eosinophils Absolute: 0.2 10*3/uL (ref 0.0–0.5)
Eosinophils Relative: 3 %
HCT: 39.8 % (ref 36.0–46.0)
Hemoglobin: 12.6 g/dL (ref 12.0–15.0)
Immature Granulocytes: 0 %
Lymphocytes Relative: 17 %
Lymphs Abs: 1.5 10*3/uL (ref 0.7–4.0)
MCH: 30.1 pg (ref 26.0–34.0)
MCHC: 31.7 g/dL (ref 30.0–36.0)
MCV: 95 fL (ref 80.0–100.0)
Monocytes Absolute: 0.5 10*3/uL (ref 0.1–1.0)
Monocytes Relative: 6 %
Neutro Abs: 6.3 10*3/uL (ref 1.7–7.7)
Neutrophils Relative %: 73 %
Platelets: 271 10*3/uL (ref 150–400)
RBC: 4.19 MIL/uL (ref 3.87–5.11)
RDW: 12.5 % (ref 11.5–15.5)
WBC: 8.5 10*3/uL (ref 4.0–10.5)
nRBC: 0 % (ref 0.0–0.2)

## 2019-07-08 LAB — BASIC METABOLIC PANEL
Anion gap: 6 (ref 5–15)
BUN: 58 mg/dL — ABNORMAL HIGH (ref 8–23)
CO2: 27 mmol/L (ref 22–32)
Calcium: 9.4 mg/dL (ref 8.9–10.3)
Chloride: 105 mmol/L (ref 98–111)
Creatinine, Ser: 1.69 mg/dL — ABNORMAL HIGH (ref 0.44–1.00)
GFR calc Af Amer: 32 mL/min — ABNORMAL LOW (ref >60)
GFR calc non Af Amer: 27 mL/min — ABNORMAL LOW (ref >60)
Glucose, Bld: 96 mg/dL (ref 70–99)
Potassium: 7 mmol/L (ref 3.5–5.1)
Sodium: 138 mmol/L (ref 135–145)

## 2019-07-08 LAB — APTT: aPTT: 28 seconds (ref 24–36)

## 2019-07-08 LAB — URINALYSIS, ROUTINE W REFLEX MICROSCOPIC
Bilirubin Urine: NEGATIVE
Glucose, UA: NEGATIVE mg/dL
Hgb urine dipstick: NEGATIVE
Ketones, ur: NEGATIVE mg/dL
Nitrite: NEGATIVE
Protein, ur: NEGATIVE mg/dL
Specific Gravity, Urine: 1.013 (ref 1.005–1.030)
pH: 5 (ref 5.0–8.0)

## 2019-07-08 LAB — ABO/RH: ABO/RH(D): A POS

## 2019-07-08 LAB — GLUCOSE, CAPILLARY: Glucose-Capillary: 109 mg/dL — ABNORMAL HIGH (ref 70–99)

## 2019-07-08 LAB — SURGICAL PCR SCREEN
MRSA, PCR: NEGATIVE
Staphylococcus aureus: POSITIVE — AB

## 2019-07-08 LAB — PROTIME-INR
INR: 0.9 (ref 0.8–1.2)
Prothrombin Time: 12.1 seconds (ref 11.4–15.2)

## 2019-07-08 LAB — HEMOGLOBIN A1C
Hgb A1c MFr Bld: 6.1 % — ABNORMAL HIGH (ref 4.8–5.6)
Mean Plasma Glucose: 128.37 mg/dL

## 2019-07-09 ENCOUNTER — Other Ambulatory Visit (HOSPITAL_COMMUNITY)
Admission: RE | Admit: 2019-07-09 | Discharge: 2019-07-09 | Disposition: A | Discharge status: Home / Self Care | Payer: Medicare Other | Source: Ambulatory Visit | Attending: Orthopedic Surgery | Admitting: Orthopedic Surgery

## 2019-07-09 ENCOUNTER — Inpatient Hospital Stay (HOSPITAL_COMMUNITY): Admission: RE | Admit: 2019-07-09 | Payer: Medicare Other | Source: Ambulatory Visit

## 2019-07-09 DIAGNOSIS — Z01812 Encounter for preprocedural laboratory examination: Secondary | ICD-10-CM | POA: Insufficient documentation

## 2019-07-09 DIAGNOSIS — Z20828 Contact with and (suspected) exposure to other viral communicable diseases: Secondary | ICD-10-CM | POA: Diagnosis not present

## 2019-07-10 NOTE — Progress Notes (Signed)
Anesthesia Chart Review   Case: [id number] Date/Time: 07/13/19 1210   Procedure: LEFT TOTAL KNEE ARTHROPLASTY (Left Knee)   Anesthesia type: Spinal   Pre-op diagnosis: LEFT KNEE OSTEOARTHRITIS   Location: [place] [facility]   Surgeon: Frederik Pear, MD      DISCUSSION:85 y.o. never smoker with h/o HTN, asthma, GERD, DM II, CKD, left knee OA scheduled for above procedure 07/13/2019 with Dr. Frederik Pear.    During PAT visit heart rate as low as 35.  She has been seen by cardiologist in the past for evaluation of bradycardia. Holter monitor with no significant arrhythmias.  Discussed with PCP, Dr. [name].  He advised me to tell the pt to hold her atenolol and follow up with him in the AM.    After speaking to Dr. [name] critical lab of K 7.0 called in.  Dr. [name] informed.  Dr. Mayer Camel made aware as well.    See was seen in office with Mort Sawyers, NP 07/09/2019.  Atenolol decreased and labs repeated.  Labs rechecked and treatment and surgical clearance per lab results.  Potassium 5.8 07/09/2019.  Will contact PCP to see if patient is cleared to proceed with surgery.  Will put in BMP for DOS regardless.  VS: BP 124/69 (BP Location: Right Arm)   Pulse 83   Temp 37.1 C (Oral)   Resp 18   Ht 5\' 2"  (1.575 m)   Wt 76.8 kg   SpO2 100%   BMI 30.96 kg/m   PROVIDERS: Corrington, Kip A, MD is PCP    LABS: PCP and Surgeon made aware of labs (all labs ordered are listed, but only abnormal results are displayed)  Labs Reviewed  SURGICAL PCR SCREEN - Abnormal; Notable for the following components:      Result Value   Staphylococcus aureus POSITIVE (*)    All other components within normal limits  BASIC METABOLIC PANEL - Abnormal; Notable for the following components:   Potassium 7.0 (*)    BUN 58 (*)    Creatinine, Ser 1.69 (*)    GFR calc non Af Amer 27 (*)    GFR calc Af Amer 32 (*)    All other components within normal limits  URINALYSIS, ROUTINE W REFLEX MICROSCOPIC -  Abnormal; Notable for the following components:   Color, Urine AMBER (*)    APPearance HAZY (*)    Leukocytes,Ua MODERATE (*)    Bacteria, UA RARE (*)    All other components within normal limits  HEMOGLOBIN A1C - Abnormal; Notable for the following components:   Hgb A1c MFr Bld 6.1 (*)    All other components within normal limits  GLUCOSE, CAPILLARY - Abnormal; Notable for the following components:   Glucose-Capillary 109 (*)    All other components within normal limits  APTT  CBC WITH DIFFERENTIAL/PLATELET  PROTIME-INR  TYPE AND SCREEN  ABO/RH     IMAGES: Chest Xray 07/08/2019 FINDINGS: The heart size and pulmonary vascularity are. The lungs are clear. No effusions. No acute bone abnormality. Right reversed total shoulder prosthesis in place.  IMPRESSION: No active cardiopulmonary disease.  EKG: 07/08/2019 Rate 53 bpm Sinus bradycardia with marked sinus arrhythmia with Premature ventricular complexes or Fusion complexes Right bundle branch block Abnormal ECG Since last tracing rate slower CV:  Past Medical History:  Diagnosis Date  . Anxiety   . Arthritis   . Asthma   . Cancer ([place])    SKIN CA OF FACE  . Chronic kidney disease   .  Depression   . Diabetes mellitus without complication ([place])   . Gallstones   . GERD (gastroesophageal reflux disease)   . Hypertension   . Pneumonia   . Prosthetic joint infection [facility])     Past Surgical History:  Procedure Laterality Date  .  ARM FRACTURE     LEFT  and right  . CHOLECYSTECTOMY    . DILATION AND CURETTAGE OF UTERUS    . IRRIGATION AND DEBRIDEMENT KNEE  09/12/2011   Procedure: IRRIGATION AND DEBRIDEMENT KNEE;  Surgeon: Kerin Salen;  Location: [place];  Service: Orthopedics;  Laterality: Right;  I&D RIGHT TKA REVISE BEARINGS/MBT. Poly exchange  . REPLACEMENT TOTAL KNEE     right  . TOTAL HIP ARTHROPLASTY    . TOTAL HIP ARTHROPLASTY Right 01/20/2018   Procedure: TOTAL HIP ARTHROPLASTY WITH REMOVAL OF  INTRAMEDULLARY NAIL;  Surgeon: Frederik Pear, MD;  Location: [place];  Service: Orthopedics;  Laterality: Right;  . TOTAL SHOULDER ARTHROPLASTY Right 10/02/2018   Procedure: REVERSE TOTAL SHOULDER;  Surgeon: Tania Ade, MD;  Location: [place];  Service: Orthopedics;  Laterality: Right;    MEDICATIONS: . albuterol (PROVENTIL HFA;VENTOLIN HFA) 108 (90 BASE) MCG/ACT inhaler  . ALPRAZolam (XANAX) 0.5 MG tablet  . amLODipine (NORVASC) 10 MG tablet  . aspirin EC 325 MG tablet  . aspirin EC 81 MG tablet  . atenolol (TENORMIN) 50 MG tablet  . CELEBREX 200 MG capsule  . cloNIDine (CATAPRES) 0.2 MG tablet  . esomeprazole (NEXIUM) 40 MG capsule  . fluticasone (FLOVENT HFA) 110 MCG/ACT inhaler  . furosemide (LASIX) 40 MG tablet  . gabapentin (NEURONTIN) 300 MG capsule  . HYDROcodone-acetaminophen (NORCO/VICODIN) 5-325 MG tablet  . naloxone (NARCAN) nasal spray 4 mg/0.1 mL  . tiZANidine (ZANAFLEX) 2 MG tablet  . zolpidem (AMBIEN) 5 MG tablet   No current facility-administered medications for this encounter.     Maia Plan [facility] Pre-Surgical Testing [phone] 07/10/19  3:01 PM

## 2019-07-11 LAB — NOVEL CORONAVIRUS, NAA (HOSP ORDER, SEND-OUT TO REF LAB; TAT 18-24 HRS): SARS-CoV-2, NAA: NOT DETECTED

## 2019-07-13 ENCOUNTER — Ambulatory Visit (HOSPITAL_COMMUNITY): Admission: RE | Admit: 2019-07-13 | Payer: Medicare Other | Source: Ambulatory Visit | Admitting: Orthopedic Surgery

## 2019-07-13 ENCOUNTER — Encounter (HOSPITAL_COMMUNITY): Admission: RE | Payer: Self-pay | Source: Ambulatory Visit

## 2019-07-13 LAB — TYPE AND SCREEN
ABO/RH(D): A POS
Antibody Screen: NEGATIVE

## 2019-07-13 SURGERY — ARTHROPLASTY, KNEE, TOTAL
Anesthesia: Spinal | Site: Knee | Laterality: Left

## 2020-05-26 ENCOUNTER — Encounter (HOSPITAL_BASED_OUTPATIENT_CLINIC_OR_DEPARTMENT_OTHER): Payer: Medicare Other | Admitting: Internal Medicine

## 2020-05-27 ENCOUNTER — Encounter (HOSPITAL_BASED_OUTPATIENT_CLINIC_OR_DEPARTMENT_OTHER): Payer: Medicare Other | Attending: Internal Medicine | Admitting: Internal Medicine

## 2020-05-27 DIAGNOSIS — I129 Hypertensive chronic kidney disease with stage 1 through stage 4 chronic kidney disease, or unspecified chronic kidney disease: Secondary | ICD-10-CM | POA: Diagnosis not present

## 2020-05-27 DIAGNOSIS — N184 Chronic kidney disease, stage 4 (severe): Secondary | ICD-10-CM | POA: Diagnosis not present

## 2020-05-27 DIAGNOSIS — Z96651 Presence of right artificial knee joint: Secondary | ICD-10-CM | POA: Diagnosis not present

## 2020-05-27 DIAGNOSIS — Z96642 Presence of left artificial hip joint: Secondary | ICD-10-CM | POA: Insufficient documentation

## 2020-05-27 DIAGNOSIS — E781 Pure hyperglyceridemia: Secondary | ICD-10-CM | POA: Insufficient documentation

## 2020-05-27 DIAGNOSIS — D509 Iron deficiency anemia, unspecified: Secondary | ICD-10-CM | POA: Diagnosis not present

## 2020-05-27 DIAGNOSIS — W548XXD Other contact with dog, subsequent encounter: Secondary | ICD-10-CM | POA: Insufficient documentation

## 2020-05-27 DIAGNOSIS — L97822 Non-pressure chronic ulcer of other part of left lower leg with fat layer exposed: Secondary | ICD-10-CM | POA: Diagnosis present

## 2020-05-27 DIAGNOSIS — S80812D Abrasion, left lower leg, subsequent encounter: Secondary | ICD-10-CM | POA: Diagnosis not present

## 2020-05-27 DIAGNOSIS — L97222 Non-pressure chronic ulcer of left calf with fat layer exposed: Secondary | ICD-10-CM | POA: Diagnosis not present

## 2020-05-27 DIAGNOSIS — I87332 Chronic venous hypertension (idiopathic) with ulcer and inflammation of left lower extremity: Secondary | ICD-10-CM | POA: Insufficient documentation

## 2020-05-31 NOTE — Progress Notes (Signed)
[name] ([id number]) Visit Report for 05/27/2020 Abuse/Suicide Risk Screen Details Patient Name: Date of Service: [name], [name]. 05/27/2020 12:30 PM Medical Record Number: [id number] Patient Account Number: [id number] Date of Birth/Sex: Treating RN: 21-May-1932 (86 y.o. F) Kela Millin Primary Care Jerrilyn Messinger: [place]inger Other Clinician: Referring Kristan Votta: Treating Cylah Fannin/Extender: Carolynn Serve, KIP Weeks in Treatment: 0 Abuse/Suicide Risk Screen Items Answer ABUSE RISK SCREEN: Has anyone close to you tried to hurt or harm you recentlyo No Do you feel uncomfortable with anyone in your familyo No Has anyone forced you do things that you didnt want to doo No Electronic Signature(s) Signed: 05/27/2020 3:13:35 PM By: Kela Millin Signed: 05/31/2020 12:03:37 PM By: Sandre Kitty Entered By: Sandre Kitty on 05/27/2020 13:11:49 -------------------------------------------------------------------------------- Activities of Daily Living Details Patient Name: Date of Service: Kristine Chestnut M. 05/27/2020 12:30 PM Medical Record Number: [id number] Patient Account Number: [id number] Date of Birth/Sex: Treating RN: 10-Aug-1932 (86 y.o. [name] Primary Care Aunica Dauphinee: [place] Ginger Other Clinician: Referring Dyamond Tolosa: Treating Madeliene Tejera/Extender: Carolynn Serve, KIP Weeks in Treatment: 0 Activities of Daily Living Items Answer Activities of Daily Living (Please select one for each item) Drive Automobile Completely Able T Medications ake Completely Able Use T elephone Completely Able Care for Appearance Completely Able Use T oilet Completely Able Bath / Shower Completely Able Dress Self Completely Able Feed Self Completely Able Walk Completely Able Get In / Out Bed Completely Able Housework Completely Able Prepare Meals Completely Able Handle Money Completely Able Shop for Self Completely Able Electronic  Signature(s) Signed: 05/27/2020 3:13:35 PM By: Kela Millin Signed: 05/31/2020 12:03:37 PM By: Sandre Kitty Entered By: Sandre Kitty on 05/27/2020 13:12:16 -------------------------------------------------------------------------------- Education Screening Details Patient Name: Date of Service: Kristine Chestnut M. 05/27/2020 12:30 PM Medical Record Number: [id number] Patient Account Number: [id number] Date of Birth/Sex: Treating RN: 29-Sep-1931 (87 y.o. F) Kela Millin Primary Care Christophe Rising: [place], [place] Other Clinician: Referring Janayla Marik: Treating Antia Rahal/Extender: Cleotis Nipper Weeks in Treatment: 0 Primary Learner Assessed: Patient Learning Preferences/Education Level/Primary Language Learning Preference: Explanation Highest Education Level: Grade School Preferred Language: English Cognitive Barrier Language Barrier: No Translator Needed: No Memory Deficit: No Emotional Barrier: No Cultural/Religious Beliefs Affecting Medical Care: No Physical Barrier Impaired Vision: Yes Glasses Impaired Hearing: No Decreased Hand dexterity: No Knowledge/Comprehension Knowledge Level: Medium Comprehension Level: High Ability to understand written instructions: High Ability to understand verbal instructions: High Motivation Anxiety Level: Anxious Cooperation: Cooperative Education Importance: Acknowledges Need Interest in Health Problems: Asks Questions Perception: Coherent Willingness to Engage in Self-Management High Activities: Readiness to Engage in Self-Management High Activities: Electronic Signature(s) Signed: 05/27/2020 3:13:35 PM By: Kela Millin Signed: 05/31/2020 12:03:37 PM By: Sandre Kitty Entered By: Sandre Kitty on 05/27/2020 13:12:51 -------------------------------------------------------------------------------- Fall Risk Assessment Details Patient Name: Date of Service: Kristine Chestnut M. 05/27/2020 12:30  PM Medical Record Number: [id number] Patient Account Number: [id number] Date of Birth/Sex: Treating RN: 14-Feb-1932 (86 y.o. [name] Primary Care Hermilo Dutter: [place], [place] Other Clinician: Referring Krisandra Bueno: Treating Othel Dicostanzo/Extender: Carolynn Serve, KIP Weeks in Treatment: 0 Fall Risk Assessment Items Have you had 2 or more falls in the last 12 monthso 0 No Have you had any fall that resulted in injury in the last 12 monthso 0 No FALLS RISK SCREEN History of falling - immediate or within 3 months 0 No Secondary diagnosis (Do you have 2 or more medical diagnoseso) 0 No  Ambulatory aid None/bed rest/wheelchair/nurse 0 No Crutches/cane/walker 0 No Furniture 0 No Intravenous therapy Access/Saline/Heparin Lock 0 No Gait/Transferring Normal/ bed rest/ wheelchair 0 No Weak (short steps with or without shuffle, stooped but able to lift head while walking, may seek 0 No support from furniture) Impaired (short steps with shuffle, may have difficulty arising from chair, head down, impaired 0 No balance) Mental Status Oriented to own ability 0 No Electronic Signature(s) Signed: 05/27/2020 3:13:35 PM By: Kela Millin Signed: 05/31/2020 12:03:37 PM By: Sandre Kitty Entered By: Sandre Kitty on 05/27/2020 13:13:03 -------------------------------------------------------------------------------- Foot Assessment Details Patient Name: Date of Service: Kristine Chestnut M. 05/27/2020 12:30 PM Medical Record Number: [id number] Patient Account Number: [id number] Date of Birth/Sex: Treating RN: 1932/02/19 (86 y.o. [name] Primary Care Madalen Gavin: [place], [place] Other Clinician: Referring Damontae Loppnow: Treating Wilhelmenia Addis/Extender: Carolynn Serve, KIP Weeks in Treatment: 0 Foot Assessment Items Site Locations + = Sensation present, - = Sensation absent, C = Callus, U = Ulcer R = Redness, W = Warmth, M = Maceration, PU = Pre-ulcerative  lesion F = Fissure, S = Swelling, D = Dryness Assessment Right: Left: Other Deformity: No No Prior Foot Ulcer: No No Prior Amputation: No No Charcot Joint: No No Ambulatory Status: Ambulatory Without Help Gait: Steady Electronic Signature(s) Signed: 05/27/2020 3:13:35 PM By: Kela Millin Signed: 05/31/2020 12:03:37 PM By: Sandre Kitty Entered By: Sandre Kitty on 05/27/2020 13:14:40 -------------------------------------------------------------------------------- Nutrition Risk Screening Details Patient Name: Date of Service: Kristine Chestnut M. 05/27/2020 12:30 PM Medical Record Number: [id number] Patient Account Number: [id number] Date of Birth/Sex: Treating RN: 1931-11-25 (86 y.o. F) Dwiggins, Larene Beach Primary Care Kevontay Burks: [place] Other Clinician: Referring Aryonna Gunnerson: Treating Wilna Pennie/Extender: Carolynn Serve, KIP Weeks in Treatment: 0 Height (in): 63 Weight (lbs): 167 Body Mass Index (BMI): 29.6 Nutrition Risk Screening Items Score Screening NUTRITION RISK SCREEN: I have an illness or condition that made me change the kind and/or amount of food I eat 0 No I eat fewer than two meals per day 0 No I eat few fruits and vegetables, or milk products 0 No I have three or more drinks of beer, liquor or wine almost every day 0 No I have tooth or mouth problems that make it hard for me to eat 0 No I don't always have enough money to buy the food I need 0 No I eat alone most of the time 0 No I take three or more different prescribed or over-the-counter drugs a day 1 Yes Without wanting to, I have lost or gained 10 pounds in the last six months 0 No I am not always physically able to shop, cook and/or feed myself 0 No Nutrition Protocols Good Risk Protocol 0 No interventions needed Moderate Risk Protocol High Risk Proctocol Risk Level: Good Risk Score: 1 Electronic Signature(s) Signed: 05/27/2020 3:13:35 PM By: Kela Millin Signed: 05/31/2020  12:03:37 PM By: Sandre Kitty Entered By: Sandre Kitty on 05/27/2020 13:13:15

## 2020-05-31 NOTE — Progress Notes (Signed)
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 - Siblings; Diabetes: Yes - Child; Heart Disease: Yes - Child,Mother; Hereditary Spherocytosis: No; Hypertension: Yes - Mother; Kidney Disease: Yes - Child; Lung Disease: No; Seizures: No; Stroke: Yes - Mother; Thyroid Problems: Yes; Tuberculosis: No; Former smoker; Marital Status - Widowed; Alcohol Use: Never; Drug Use: No History; Caffeine Use: Daily; Financial Concerns: No; Food, Clothing or Shelter Needs: No; Support System Lacking: No; Transportation Concerns: No Electronic Signature(s) Signed: 05/27/2020 3:13:35 PM By: Kela Millin Signed: 05/31/2020 7:41:19 AM By: Linton Ham MD Signed: 05/31/2020 12:03:37 PM By: Sandre Kitty Entered By: Sandre Kitty on 05/27/2020 13:11:41 -------------------------------------------------------------------------------- SuperBill Details Patient Name: Date of Service: Kristine Peters. 05/27/2020 Medical Record Number:  [id number] Patient Account Number: [id number] Date of Birth/Sex: Treating RN: Feb 26, 1932 (86 y.o. [name] Primary Care Provider: [place] Anise [place], [place] Other Clinician: Referring Provider: Treating Provider/Extender: Carolynn Serve, KIP Weeks in Treatment: 0 Diagnosis Coding ICD-10 Codes Code Description [phone] Chronic venous hypertension (idiopathic) with ulcer and inflammation of left lower extremity L97.822 Non-pressure chronic ulcer of other part of left lower leg with fat layer exposed Facility Procedures CPT4 Code: 46568127 Description: 51700 - WOUND CARE VISIT-LEV 3 EST PT Modifier: 25 Quantity: 1 CPT4 Code: [id number] Description: 11042 - DEB SUBQ TISSUE 20 SQ CM/< ICD-10 Diagnosis Description L97.822 Non-pressure chronic ulcer of other part of left lower leg with fat layer expos Modifier: ed Quantity: 1 Physician Procedures : CPT4 Code Description Modifier [id number] WC PHYS LEVEL 3 NEW PT 25 ICD-10 Diagnosis Description I87.332 Chronic venous hypertension (idiopathic) with ulcer and inflammation of left lower extremity L97.822 Non-pressure chronic ulcer of other part of left  lower leg  with fat layer exposed Quantity: 1 : [id number] 11042 - WC PHYS SUBQ TISS 20 SQ CM ICD-10 Diagnosis Description L97.822 Non-pressure chronic ulcer of other part of left lower leg with fat layer exposed Quantity: 1 Electronic Signature(s) Signed: 05/31/2020 7:41:19 AM By: Linton Ham MD Previous Signature: 05/27/2020 3:13:35 PM Version By: Kela Millin Entered By: Linton Ham on 05/29/2020 07:25:03

## 2020-05-31 NOTE — Progress Notes (Signed)
Kristine, [name] ([id number]) Visit Report for 05/27/2020 Allergy List Details Patient Name: Date of Service: [name], [name]. 05/27/2020 12:30 PM Medical Record Number: [id number] Patient Account Number: [id number] Date of Birth/Sex: Treating RN: 1932/08/17 (86 y.o. [name] Primary Care Ruba Outen: [place], [place] Other Clinician: Referring Juley Giovanetti: Treating Paradise Vensel/Extender: Linton [name] CO RRINGTO Delane Ginger, KIP Weeks in Treatment: 0 Allergies Active Allergies lisinopril hydrochlorothiazide Lipitor Thiazides Zocor codeine Allergy Notes Electronic Signature(s) Signed: 05/31/2020 12:03:37 PM By: Sandre Kitty Entered By: Sandre Kitty on 05/27/2020 13:05:45 -------------------------------------------------------------------------------- Arrival Information Details Patient Name: Date of Service: Kristine Chestnut M. 05/27/2020 12:30 PM Medical Record Number: [id number] Patient Account Number: [id number] Date of Birth/Sex: Treating RN: [birth year] (86 y.o. [name] Primary Care Ritter Helsley: [place], [place] Other Clinician: Referring Daryon Remmert: Treating Kaj Vasil/Extender: Carolynn Serve, KIP Weeks in Treatment: 0 Visit Information Patient Arrived: Cane Arrival Time: 12:58 Accompanied By: self Transfer Assistance: None Patient Identification Verified: Yes Secondary Verification Process Completed: Yes Electronic Signature(s) Signed: 05/31/2020 12:03:37 PM By: Sandre Kitty Entered By: Sandre Kitty on 05/27/2020 12:58:56 -------------------------------------------------------------------------------- Clinic Level of Care Assessment Details Patient Name: Date of Service: Kristine, [name]. 05/27/2020 12:30 PM Medical Record Number: [id number] Patient Account Number: [id number] Date of Birth/Sex: Treating RN: Feb 09, 1932 (86 y.o. F) Kela Millin Primary Care Rhodie Cienfuegos: [place] Secund[place] Ginger Other Clinician: Referring Maribel Hadley: Treating  Kamoria Lucien/Extender: Linton [name] CO RRINGTO Delane Ginger, KIP Weeks in Treatment: 0 Clinic Level of Care Assessment Items TOOL 1 Quantity Score X- 1 0 Use when EandM and Procedure is performed on INITIAL visit ASSESSMENTS - Nursing Assessment / Reassessment X- 1 20 General Physical Exam (combine w/ comprehensive assessment (listed just below) when performed on new pt. evals) X- 1 25 Comprehensive Assessment (HX, ROS, Risk Assessments, Wounds Hx, etc.) ASSESSMENTS - Wound and Skin Assessment / Reassessment []  - 0 Dermatologic / Skin Assessment (not related to wound area) ASSESSMENTS - Ostomy and/or Continence Assessment and Care []  - 0 Incontinence Assessment and Management []  - 0 Ostomy Care Assessment and Management (repouching, etc.) PROCESS - Coordination of Care X - Simple Patient / Family Education for ongoing care 1 15 []  - 0 Complex (extensive) Patient / Family Education for ongoing care X- 1 10 Staff obtains Programmer, systems, Records, T Results / Process Orders est []  - 0 Staff telephones HHA, Nursing Homes / Clarify orders / etc []  - 0 Routine Transfer to another Facility (non-emergent condition) []  - 0 Routine Hospital Admission (non-emergent condition) X- 1 15 New Admissions / Biomedical engineer / Ordering NPWT Apligraf, etc. , []  - 0 Emergency Hospital Admission (emergent condition) PROCESS - Special Needs []  - 0 Pediatric / Minor Patient Management []  - 0 Isolation Patient Management []  - 0 Hearing / Language / Visual special needs []  - 0 Assessment of Community assistance (transportation, D/C planning, etc.) []  - 0 Additional assistance / Altered mentation []  - 0 Support Surface(s) Assessment (bed, cushion, seat, etc.) INTERVENTIONS - Miscellaneous []  - 0 External ear exam []  - 0 Patient Transfer (multiple staff / Civil Service fast streamer / Similar devices) []  - 0 Simple Staple / Suture removal (25 or less) []  - 0 Complex Staple / Suture removal (26 or more) []  -  0 Hypo/Hyperglycemic Management (do not check if billed separately) X- 1 15 Ankle / Brachial Index (ABI) - do not check if billed separately Has the patient been seen at the hospital within the last three years: Yes Total Score: 100 Level Of Care: New/Established -  Level 3 Electronic Signature(s) Signed: 05/27/2020 3:13:35 PM By: Kela Millin Signed: 05/27/2020 3:13:35 PM By: Kela Millin Entered By: Kela Millin on 05/27/2020 13:43:13 -------------------------------------------------------------------------------- Compression Therapy Details Patient Name: Date of Service: Kristine Chestnut M. 05/27/2020 12:30 PM Medical Record Number: [id number] Patient Account Number: [id number] Date of Birth/Sex: Treating RN: May 09, 1932 (86 y.o. [name] Primary Care Azarion Hove: [place] Secundino Ginger Other Clinician: Referring Daneen Volcy: Treating Tamra Koos/Extender: Carolynn Serve, KIP Weeks in Treatment: 0 Compression Therapy Performed for Wound Assessment: Wound #1 Left,Proximal,Lateral Lower Leg Performed By: Clinician Baruch Gouty, RN Compression Type: Three Layer Post Procedure Diagnosis Same as Pre-procedure Electronic Signature(s) Signed: 05/27/2020 3:13:35 PM By: Kela Millin Entered By: Kela Millin on 05/27/2020 13:43:30 -------------------------------------------------------------------------------- Compression Therapy Details Patient Name: Date of Service: Kristine, [name]. 05/27/2020 12:30 PM Medical Record Number: [id number] Patient Account Number: [id number] Date of Birth/Sex: Treating RN: May 31, 1932 (86 y.o. [name] Primary Care Keiosha Cancro: [place] Secundino Ginger Other Clinician: Referring Xzaria Teo: Treating Quinnie Barcelo/Extender: Carolynn Serve, KIP Weeks in Treatment: 0 Compression Therapy Performed for Wound Assessment: Wound #2 Left,Distal Lower Leg Performed By: Clinician Baruch Gouty, RN Compression Type:  Three Layer Post Procedure Diagnosis Same as Pre-procedure Electronic Signature(s) Signed: 05/27/2020 3:13:35 PM By: Kela Millin Entered By: Kela Millin on 05/27/2020 13:43:30 -------------------------------------------------------------------------------- Encounter Discharge Information Details Patient Name: Date of Service: Kristine Chestnut M. 05/27/2020 12:30 PM Medical Record Number: [id number] Patient Account Number: [id number] Date of Birth/Sex: Treating RN: March 12, 1932 (86 y.o. Nancy Fetter Primary Care Rhiley Tarver: [place] Secundino Ginger Other Clinician: Referring Sadaf Przybysz: Treating Ami Thornsberry/Extender: Carolynn Serve, KIP Weeks in Treatment: 0 Encounter Discharge Information Items Post Procedure Vitals Discharge Condition: Stable Temperature (F): 98.2 Ambulatory Status: Cane Pulse (bpm): 77 Discharge Destination: Home Respiratory Rate (breaths/min): 18 Transportation: Private Auto Blood Pressure (mmHg): 160/70 Accompanied By: alone Schedule Follow-up Appointment: Yes Clinical Summary of Care: Electronic Signature(s) Signed: 05/27/2020 3:22:52 PM By: Levan Hurst RN, BSN Entered By: Levan Hurst on 05/27/2020 15:21:17 -------------------------------------------------------------------------------- Lower Extremity Assessment Details Patient Name: Date of Service: Kristine Chestnut M. 05/27/2020 12:30 PM Medical Record Number: [id number] Patient Account Number: [id number] Date of Birth/Sex: Treating RN: October 05, 1931 (86 y.o. [name] Primary Care Ladaija Dimino: [place], [place] Other Clinician: Referring Stevan Eberwein: Treating Jasiyah Poland/Extender: Linton [name] CO RRINGTO Delane Ginger, KIP Weeks in Treatment: 0 Edema Assessment Assessed: [Left: No] [Right: No] E[Left: dema] [Right: :] Calf Left: Right: Point of Measurement: 36 cm From Medial Instep 34 cm cm Ankle Left: Right: Point of Measurement: 9 cm From Medial Instep 23 cm cm Vascular  Assessment Blood Pressure: Brachial: [Left:160] Ankle: [Left:Dorsalis Pedis: 144 0.90] Electronic Signature(s) Signed: 05/27/2020 3:13:35 PM By: Kela Millin Signed: 05/31/2020 12:03:37 PM By: Sandre Kitty Entered By: Sandre Kitty on 05/27/2020 13:19:46 -------------------------------------------------------------------------------- Multi Wound Chart Details Patient Name: Date of Service: Kristine Chestnut M. 05/27/2020 12:30 PM Medical Record Number: [id number] Patient Account Number: [id number] Date of Birth/Sex: Treating RN: 1932/04/17 (86 y.o. F) Kela Millin Primary Care Paullette Mckain: [place] Secundino Ginger Other Clinician: Referring Kali Ambler: Treating Sriansh Farra/Extender: Carolynn Serve, KIP Weeks in Treatment: 0 Vital Signs Height(in): 63 Pulse(bpm): 77 Weight(lbs): 167 Blood Pressure(mmHg): 160/70 Body Mass Index(BMI): 30 Temperature(F): 98.2 Respiratory Rate(breaths/min): 18 Photos: [1:No Photos Left, Proximal, Lateral Lower Leg] [2:No Photos Left, Distal Lower Leg] [N/A:N/A N/A] Wound Location: [1:Trauma] [2:Trauma] [N/A:N/A] Wounding Event: [1:Venous Leg Ulcer] [2:Venous Leg Ulcer] [N/A:N/A] Primary Etiology: [1:Asthma] [2:Asthma] [N/A:N/A] Comorbid History: [1:03/24/2020] [2:03/24/2020] [  N/A:N/A] Date Acquired: [1:0] [2:0] [N/A:N/A] Weeks of Treatment: [1:Open] [2:Open] [N/A:N/A] Wound Status: [1:0.8x0.5x0.1] [2:3x1.4x0.1] [N/A:N/A] Measurements L x W x D (cm) [1:0.314] [2:9.518] [N/A:N/A] A (cm) : rea [1:0.031] [2:0.33] [N/A:N/A] Volume (cm) : [1:0.00%] [2:N/A] [N/A:N/A] % Reduction in A rea: [1:0.00%] [2:N/A] [N/A:N/A] % Reduction in Volume: [1:Full Thickness Without Exposed] [2:Full Thickness Without Exposed] [N/A:N/A] Classification: [1:Support Structures Medium] [2:Support Structures Medium] [N/A:N/A] Exudate A mount: [1:Serosanguineous] [2:Serosanguineous] [N/A:N/A] Exudate Type: [1:red, brown] [2:red, brown] [N/A:N/A] Exudate Color:  [1:Medium (34-66%)] [2:Medium (34-66%)] [N/A:N/A] Granulation A mount: [1:Red, Pink] [2:Red, Pink] [N/A:N/A] Granulation Quality: [1:Medium (34-66%)] [2:Medium (34-66%)] [N/A:N/A] Necrotic A mount: [1:Fat Layer (Subcutaneous Tissue): Yes Fat Layer (Subcutaneous Tissue): Yes N/A] Exposed Structures: [1:Fascia: No Tendon: No Muscle: No Joint: No Bone: No None] [2:Fascia: No Tendon: No Muscle: No Joint: No Bone: No None] [N/A:N/A] Epithelialization: [1:Debridement - Excisional] [2:Debridement - Excisional] [N/A:N/A] Debridement: Pre-procedure Verification/Time Out 13:40 [2:13:40] [N/A:N/A] Taken: [1:Other] [2:Other] [N/A:N/A] Pain Control: [1:Subcutaneous] [2:Subcutaneous] [N/A:N/A] Tissue Debrided: [1:Skin/Subcutaneous Tissue] [2:Skin/Subcutaneous Tissue] [N/A:N/A] Level: [1:0.25] [2:4.2] [N/A:N/A] Debridement A (sq cm): [1:rea Curette] [2:Curette] [N/A:N/A] Instrument: [1:Minimum] [2:Minimum] [N/A:N/A] Bleeding: [1:Pressure] [2:Pressure] [N/A:N/A] Hemostasis A chieved: [1:0] [2:0] [N/A:N/A] Procedural Pain: [1:0] [2:0] [N/A:N/A] Post Procedural Pain: [1:Procedure was tolerated well] [2:Procedure was tolerated well] [N/A:N/A] Debridement Treatment Response: [1:0.8x0.5x0.1] [2:3x1.4x0.1] [N/A:N/A] Post Debridement Measurements L x W x D (cm) [1:0.031] [2:0.33] [N/A:N/A] Post Debridement Volume: (cm) [1:Compression Therapy] [2:Compression Therapy] [N/A:N/A] Procedures Performed: [1:Debridement] [2:Debridement] Treatment Notes Wound #1 (Left, Proximal, Lateral Lower Leg) 1. Cleanse With Soap and water 2. Periwound Care Moisturizing lotion 3. Primary Dressing Applied Hydrofera Blue 4. Secondary Dressing ABD Pad Dry Gauze 6. Support Layer Applied 3 layer compression wrap Wound #2 (Left, Distal Lower Leg) 1. Cleanse With Soap and water 2. Periwound Care Moisturizing lotion 3. Primary Dressing Applied Hydrofera Blue 4. Secondary Dressing ABD Pad Dry Gauze 6. Support Layer  Applied 3 layer compression wrap Electronic Signature(s) Signed: 05/31/2020 7:41:19 AM By: Linton Ham MD Signed: 05/31/2020 5:24:19 PM By: Kela Millin Entered By: Linton [name] on 05/29/2020 07:03:42 -------------------------------------------------------------------------------- Multi-Disciplinary Care Plan Details Patient Name: Date of Service: Kristine Chestnut M. 05/27/2020 12:30 PM Medical Record Number: [id number] Patient Account Number: [id number] Date of Birth/Sex: Treating RN: 1932-08-14 (86 y.o. [name] Primary Care Lameka Disla: [place] Secund[place] Ginger Other Clinician: Referring Rachal Dvorsky: Treating Billi Bright/Extender: Carolynn Serve, KIP Weeks in Treatment: 0 Active Inactive Venous Leg Ulcer Nursing Diagnoses: Knowledge deficit related to disease process and management Goals: Patient/caregiver will verbalize understanding of disease process and disease management Date Initiated: 05/27/2020 Target Resolution Date: 06/24/2020 Goal Status: Active Interventions: Compression as ordered Provide education on venous insufficiency Notes: Wound/Skin Impairment Nursing Diagnoses: Impaired tissue integrity Goals: Ulcer/skin breakdown will have a volume reduction of 30% by week 4 Date Initiated: 05/27/2020 Target Resolution Date: 06/24/2020 Goal Status: Active Interventions: Provide education on ulcer and skin care Notes: Electronic Signature(s) Signed: 05/27/2020 3:13:35 PM By: Kela Millin Entered By: Kela Millin on 05/27/2020 13:38:50 -------------------------------------------------------------------------------- Pain Assessment Details Patient Name: Date of Service: BRITHANY, WHITWORTH. 05/27/2020 12:30 PM Medical Record Number: [id number] Patient Account Number: [id number] Date of Birth/Sex: Treating RN: 1932-06-18 (86 y.o. [name] Primary Care Otha Monical: [place] Secund[place] Ginger Other Clinician: Referring Melizza Kanode: Treating  Rissa Turley/Extender: Carolynn Serve, KIP Weeks in Treatment: 0 Active Problems Location of Pain Severity and Description of Pain Patient Has Paino No Site Locations Pain Management and Medication Current Pain Management: Electronic Signature(s) Signed: 05/27/2020 3:13:35  PM By: Kela Millin Signed: 05/31/2020 12:03:37 PM By: Sandre Kitty Entered By: Sandre Kitty on 05/27/2020 13:22:45 -------------------------------------------------------------------------------- Patient/Caregiver Education Details Patient Name: Date of Service: Morton Peters 9/3/2021andnbsp12:30 PM Medical Record Number: [id number] Patient Account Number: [id number] Date of Birth/Gender: Treating RN: Nov 02, 1931 (86 y.o. [name] Primary Care Physician: [place]ise [place], [place] Other Clinician: Referring Physician: Treating Physician/Extender: Cleotis Nipper Weeks in Treatment: 0 Education Assessment Education Provided To: Patient Education Topics Provided Venous: Handouts: Controlling Swelling with Multilayered Compression Wraps Methods: Explain/Verbal Responses: State content correctly Wound/Skin Impairment: Handouts: Caring for Your Ulcer Methods: Explain/Verbal Responses: State content correctly Electronic Signature(s) Signed: 05/27/2020 3:13:35 PM By: Kela Millin Entered By: Kela Millin on 05/27/2020 13:39:28 -------------------------------------------------------------------------------- Wound Assessment Details Patient Name: Date of Service: Kristine Chestnut M. 05/27/2020 12:30 PM Medical Record Number: [id number] Patient Account Number: [id number] Date of Birth/Sex: Treating RN: 12-10-1931 (86 y.o. [name] Primary Care Aleasha Fregeau: [place], [place] Other Clinician: Referring Rushil Kimbrell: Treating Zissy Hamlett/Extender: Linton [name] CO RRINGTO Delane Ginger, KIP Weeks in Treatment: 0 Wound Status Wound Number: 1 Primary Etiology:  Venous Leg Ulcer Wound Location: Left, Proximal, Lateral Lower Leg Wound Status: Open Wounding Event: Trauma Comorbid History: Asthma Date Acquired: 03/24/2020 Weeks Of Treatment: 0 Clustered Wound: No Wound Measurements Length: (cm) 0.8 Width: (cm) 0.5 Depth: (cm) 0.1 Area: (cm) 0.314 Volume: (cm) 0.031 % Reduction in Area: 0% % Reduction in Volume: 0% Epithelialization: None Tunneling: No Undermining: No Wound Description Classification: Full Thickness Without Exposed Support Structures Exudate Amount: Medium Exudate Type: Serosanguineous Exudate Color: red, brown Foul Odor After Cleansing: No Slough/Fibrino Yes Wound Bed Granulation Amount: Medium (34-66%) Exposed Structure Granulation Quality: Red, Pink Fascia Exposed: No Necrotic Amount: Medium (34-66%) Fat Layer (Subcutaneous Tissue) Exposed: Yes Necrotic Quality: Adherent Slough Tendon Exposed: No Muscle Exposed: No Joint Exposed: No Bone Exposed: No Treatment Notes Wound #1 (Left, Proximal, Lateral Lower Leg) 1. Cleanse With Soap and water 2. Periwound Care Moisturizing lotion 3. Primary Dressing Applied Hydrofera Blue 4. Secondary Dressing ABD Pad Dry Gauze 6. Support Layer Applied 3 layer compression wrap Electronic Signature(s) Signed: 05/27/2020 3:13:35 PM By: Kela Millin Signed: 05/31/2020 12:03:37 PM By: Sandre Kitty Entered By: Sandre Kitty on 05/27/2020 13:22:30 -------------------------------------------------------------------------------- Wound Assessment Details Patient Name: Date of Service: Kristine Chestnut M. 05/27/2020 12:30 PM Medical Record Number: [id number] Patient Account Number: [id number] Date of Birth/Sex: Treating RN: 27-Feb-1932 (86 y.o. [name] Primary Care Kesia Dalto: [place], [place] Other Clinician: Referring Jacobey Gura: Treating Duncan Alejandro/Extender: Linton [name] CO RRINGTO Delane Ginger, KIP Weeks in Treatment: 0 Wound Status Wound Number: 2 Primary  Etiology: Venous Leg Ulcer Wound Location: Left, Distal Lower Leg Wound Status: Open Wounding Event: Trauma Comorbid History: Asthma Date Acquired: 03/24/2020 Weeks Of Treatment: 0 Clustered Wound: No Wound Measurements Length: (cm) 3 Width: (cm) 1.4 Depth: (cm) 0.1 Area: (cm) 3.299 Volume: (cm) 0.33 % Reduction in Area: % Reduction in Volume: Epithelialization: None Tunneling: No Undermining: No Wound Description Classification: Full Thickness Without Exposed Support Structures Exudate Amount: Medium Exudate Type: Serosanguineous Exudate Color: red, brown Foul Odor After Cleansing: No Slough/Fibrino Yes Wound Bed Granulation Amount: Medium (34-66%) Exposed Structure Granulation Quality: Red, Pink Fascia Exposed: No Necrotic Amount: Medium (34-66%) Fat Layer (Subcutaneous Tissue) Exposed: Yes Necrotic Quality: Adherent Slough Tendon Exposed: No Muscle Exposed: No Joint Exposed: No Bone Exposed: No Treatment Notes Wound #2 (Left, Distal Lower Leg) 1. Cleanse With Soap and water 2. Periwound Care Moisturizing lotion 3. Primary Dressing  Applied Hydrofera Blue 4. Secondary Dressing ABD Pad Dry Gauze 6. Support Layer Applied 3 layer compression wrap Electronic Signature(s) Signed: 05/27/2020 3:13:35 PM By: Kela Millin Signed: 05/31/2020 12:03:37 PM By: Sandre Kitty Entered By: Sandre Kitty on 05/27/2020 13:22:26 -------------------------------------------------------------------------------- Vitals Details Patient Name: Date of Service: Kristine Chestnut M. 05/27/2020 12:30 PM Medical Record Number: [id number] Patient Account Number: [id number] Date of Birth/Sex: Treating RN: [birth year] (86 y.o. [name] Primary Care Wyett Narine: [place], [place] Other Clinician: Referring Tyreonna Czaplicki: Treating Korynne Dols/Extender: Carolynn Serve, KIP Weeks in Treatment: 0 Vital Signs Time Taken: 12:59 Temperature (F): 98.2 Height (in):  63 Pulse (bpm): 77 Source: Stated Respiratory Rate (breaths/min): 18 Weight (lbs): 167 Blood Pressure (mmHg): 160/70 Source: Stated Reference Range: 80 - 120 mg / dl Body Mass Index (BMI): 29.6 Electronic Signature(s) Signed: 05/31/2020 12:03:37 PM By: Sandre Kitty Entered By: Sandre Kitty on 05/27/2020 13:03:58

## 2020-06-06 ENCOUNTER — Other Ambulatory Visit: Payer: Self-pay

## 2020-06-06 ENCOUNTER — Encounter (HOSPITAL_BASED_OUTPATIENT_CLINIC_OR_DEPARTMENT_OTHER): Payer: Medicare Other | Admitting: Internal Medicine

## 2020-06-06 DIAGNOSIS — I87332 Chronic venous hypertension (idiopathic) with ulcer and inflammation of left lower extremity: Secondary | ICD-10-CM | POA: Diagnosis not present
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
## 2020-06-13 ENCOUNTER — Encounter (HOSPITAL_BASED_OUTPATIENT_CLINIC_OR_DEPARTMENT_OTHER): Payer: Medicare Other | Admitting: Internal Medicine

## 2020-06-13 ENCOUNTER — Other Ambulatory Visit: Payer: Self-pay

## 2020-06-13 DIAGNOSIS — I87332 Chronic venous hypertension (idiopathic) with ulcer and inflammation of left lower extremity: Secondary | ICD-10-CM | POA: Diagnosis not present

## 2020-06-13 NOTE — Progress Notes (Signed)
Kristine, [name] ([id number]) Visit Report for 06/06/2020 Arrival Information Details Patient Name: Date of Service: [name], [name]. 06/06/2020 12:30 PM Medical Record Number: [id number] Patient Account Number: [id number] Date of Birth/Sex: Treating RN: Dec 27, 1931 (86 y.o. Orvan Falconer Primary Care Ceria Suminski: [place] Secund[place] Ginger Other Clinician: Referring Niki Payment: Treating Javed Cotto/Extender: Carolynn Serve, KIP Weeks in Treatment: 1 Visit Information History Since Last Visit All ordered tests and consults were completed: No Patient Arrived: [name] Added or deleted any medications: No Arrival Time: 12:54 Any new allergies or adverse reactions: No Accompanied By: daughter Had a fall or experienced change in No Transfer Assistance: None activities of daily living that may affect Patient Identification Verified: Yes risk of falls: Secondary Verification Process Completed: Yes Signs or symptoms of abuse/neglect since last visito No Patient Requires Transmission-Based Precautions: No Hospitalized since last visit: No Patient Has Alerts: No Implantable device outside of the clinic excluding No cellular tissue based products placed in the center since last visit: Has Dressing in Place as Prescribed: Yes Has Compression in Place as Prescribed: Yes Pain Present Now: No Electronic Signature(s) Signed: 06/13/2020 1:16:40 PM By: Carlene Coria RN Entered By: Carlene Coria on 06/06/2020 12:55:18 -------------------------------------------------------------------------------- Compression Therapy Details Patient Name: Date of Service: [name], [name]. 06/06/2020 12:30 PM Medical Record Number: [id number] Patient Account Number: [id number] Date of Birth/Sex: Treating RN: Oct 16, 1931 (86 y.o. Nancy Fetter Primary Care Trenia Tennyson: [place] Secund[place] Ginger Other Clinician: Referring Shonna Deiter: Treating Arlynn Stare/Extender: Carolynn Serve, KIP Weeks in Treatment:  1 Compression Therapy Performed for Wound Assessment: Wound #1 Left,Proximal,Lateral Lower Leg Performed By: Clinician Levan Hurst, RN Compression Type: Three Layer Post Procedure Diagnosis Same as Pre-procedure Electronic Signature(s) Signed: 06/08/2020 5:57:30 PM By: Levan Hurst RN, BSN Entered By: Levan Hurst on 06/06/2020 13:30:46 -------------------------------------------------------------------------------- Compression Therapy Details Patient Name: Date of Service: Kristine Chestnut M. 06/06/2020 12:30 PM Medical Record Number: [id number] Patient Account Number: [id number] Date of Birth/Sex: Treating RN: [birth year] (86 y.o. Nancy Fetter Primary Care Jamair Cato: [place] Secund[place] Ginger Other Clinician: Referring Draven Laine: Treating Jaymison Luber/Extender: Carolynn Serve, KIP Weeks in Treatment: 1 Compression Therapy Performed for Wound Assessment: Wound #2 Left,Distal,Lateral Lower Leg Performed By: Clinician Levan Hurst, RN Compression Type: Three Layer Post Procedure Diagnosis Same as Pre-procedure Electronic Signature(s) Signed: 06/08/2020 5:57:30 PM By: Levan Hurst RN, BSN Entered By: Levan Hurst on 06/06/2020 13:30:46 -------------------------------------------------------------------------------- Encounter Discharge Information Details Patient Name: Date of Service: Kristine Chestnut M. 06/06/2020 12:30 PM Medical Record Number: [id number] Patient Account Number: [id number] Date of Birth/Sex: Treating RN: July 19, 1932 (86 y.o. Debby Bud Primary Care Trevante Tennell: [place] Anise [place], [place] Other Clinician: Referring Zakirah Weingart: Treating Gearl Baratta/Extender: Linton Ham [place], KIP Weeks in Treatment: 1 Encounter Discharge Information Items Discharge Condition: Stable Ambulatory Status: Walker Discharge Destination: Home Transportation: Private Auto Accompanied By: family member Schedule Follow-up Appointment: Yes Clinical Summary of  Care: Electronic Signature(s) Signed: 06/06/2020 5:15:56 PM By: Deon Pilling Entered By: Deon Pilling on 06/06/2020 13:54:28 -------------------------------------------------------------------------------- Lower Extremity Assessment Details Patient Name: Date of Service: Kristine, [name]. 06/06/2020 12:30 PM Medical Record Number: [id number] Patient Account Number: [id number] Date of Birth/Sex: Treating RN: 03-29-1932 (86 y.o. Orvan Falconer Primary Care Aum Caggiano: [place], [place] Other Clinician: Referring Katherinne Mofield: Treating Teagon Kron/Extender: Linton Ham CO RRINGTO Delane Ginger, KIP Weeks in Treatment: 1 Edema Assessment Assessed: [Left: No] [Right: No] E[Left: dema] [Right: :] Calf Left: Right: Point of Measurement: 36 cm From Medial Instep  30 cm cm Ankle Left: Right: Point of Measurement: 9 cm From Medial Instep 20.5 cm cm Electronic Signature(s) Signed: 06/13/2020 1:16:40 PM By: Carlene Coria RN Entered By: Carlene Coria on 06/06/2020 13:01:25 -------------------------------------------------------------------------------- Multi Wound Chart Details Patient Name: Date of Service: Kristine Chestnut M. 06/06/2020 12:30 PM Medical Record Number: [id number] Patient Account Number: [id number] Date of Birth/Sex: Treating RN: 08/08/1932 (86 y.o. Nancy Fetter Primary Care Gray Maugeri: [place] Secund[place] Ginger Other Clinician: Referring Tezra Mahr: Treating Naziyah Tieszen/Extender: Carolynn Serve, KIP Weeks in Treatment: 1 Vital Signs Height(in): 63 Pulse(bpm): 71 Weight(lbs): 167 Blood Pressure(mmHg): 161/76 Body Mass Index(BMI): 30 Temperature(F): 97.9 Respiratory Rate(breaths/min): 18 Photos: [1:No Photos Left, Proximal, Lateral Lower Leg] [2:No Photos Left, Distal Lower Leg] [N/A:N/A N/A] Wound Location: [1:Trauma] [2:Trauma] [N/A:N/A] Wounding Event: [1:Venous Leg Ulcer] [2:Venous Leg Ulcer] [N/A:N/A] Primary Etiology: [1:Asthma] [2:Asthma] [N/A:N/A] Comorbid History:  [1:03/24/2020] [2:03/24/2020] [N/A:N/A] Date Acquired: [1:1] [2:1] [N/A:N/A] Weeks of Treatment: [1:Open] [2:Open] [N/A:N/A] Wound Status: [1:0.2x0.2x0.1] [2:2.6x1x0.1] [N/A:N/A] Measurements L x W x D (cm) [1:0.031] [2:2.042] [N/A:N/A] A (cm) : rea [1:0.003] [2:0.204] [N/A:N/A] Volume (cm) : [1:90.10%] [2:38.10%] [N/A:N/A] % Reduction in A rea: [1:90.30%] [2:38.20%] [N/A:N/A] % Reduction in Volume: [1:Full Thickness Without Exposed] [2:Full Thickness Without Exposed] [N/A:N/A] Classification: [1:Support Structures Medium] [2:Support Structures Medium] [N/A:N/A] Exudate Amount: [1:Serosanguineous] [2:Serosanguineous] [N/A:N/A] Exudate Type: [1:red, brown] [2:red, brown] [N/A:N/A] Exudate Color: [1:Medium (34-66%)] [2:Medium (34-66%)] [N/A:N/A] Granulation Amount: [1:Red, Pink] [2:Red, Pink] [N/A:N/A] Granulation Quality: [1:Medium (34-66%)] [2:Medium (34-66%)] [N/A:N/A] Necrotic Amount: [1:Fat Layer (Subcutaneous Tissue): Yes Fat Layer (Subcutaneous Tissue): Yes N/A] Exposed Structures: [1:Fascia: No Tendon: No Muscle: No Joint: No Bone: No None] [2:Fascia: No Tendon: No Muscle: No Joint: No Bone: No Small (1-33%)] [N/A:N/A] Epithelialization: [1:Compression Therapy] [2:Compression Therapy] [N/A:N/A] Treatment Notes Electronic Signature(s) Signed: 06/07/2020 5:12:45 PM By: Linton Ham MD Signed: 06/08/2020 5:57:30 PM By: Levan Hurst RN, BSN Entered By: Linton Ham on 06/06/2020 13:34:09 -------------------------------------------------------------------------------- Multi-Disciplinary Care Plan Details Patient Name: Date of Service: Kristine Chestnut M. 06/06/2020 12:30 PM Medical Record Number: [id number] Patient Account Number: [id number] Date of Birth/Sex: Treating RN: 1932-06-24 (86 y.o. Nancy Fetter Primary Care Lynnox Girten: [place] Ginger Other Clinician: Referring Tedrick Port: Treating Katilyn Miltenberger/Extender: Carolynn Serve, KIP Weeks in Treatment:  1 Active Inactive Venous Leg Ulcer Nursing Diagnoses: Knowledge deficit related to disease process and management Goals: Patient/caregiver will verbalize understanding of disease process and disease management Date Initiated: 05/27/2020 Target Resolution Date: 06/24/2020 Goal Status: Active Interventions: Compression as ordered Provide education on venous insufficiency Notes: Wound/Skin Impairment Nursing Diagnoses: Impaired tissue integrity Goals: Ulcer/skin breakdown will have a volume reduction of 30% by week 4 Date Initiated: 05/27/2020 Target Resolution Date: 06/24/2020 Goal Status: Active Interventions: Provide education on ulcer and skin care Notes: Electronic Signature(s) Signed: 06/08/2020 5:57:30 PM By: Levan Hurst RN, BSN Entered By: Levan Hurst on 06/06/2020 13:38:08 -------------------------------------------------------------------------------- Pain Assessment Details Patient Name: Date of Service: Kristine Chestnut M. 06/06/2020 12:30 PM Medical Record Number: [id number] Patient Account Number: [id number] Date of Birth/Sex: Treating RN: April 24, 1932 (86 y.o. Orvan Falconer Primary Care Briellah Baik: [place], [place] Other Clinician: Referring Natalya Domzalski: Treating Lilas Diefendorf/Extender: Carolynn Serve, KIP Weeks in Treatment: 1 Active Problems Location of Pain Severity and Description of Pain Patient Has Paino No Site Locations Pain Management and Medication Current Pain Management: Electronic Signature(s) Signed: 06/13/2020 1:16:40 PM By: Carlene Coria RN Entered By: Carlene Coria on 06/06/2020 12:55:49 -------------------------------------------------------------------------------- Patient/Caregiver Education Details Patient Name: Date of Service: Eli Hose  NNIE M. 9/13/2021andnbsp12:30 PM Medical Record Number: [id number] Patient Account Number: [id number] Date of Birth/Gender: Treating RN: 1931-11-19 (86 y.o. Nancy Fetter Primary Care  Physician: [place], [place] Other Clinician: Referring Physician: Treating Physician/Extender: Cleotis Nipper Weeks in Treatment: 1 Education Assessment Education Provided To: Patient Education Topics Provided Venous: Methods: Explain/Verbal Responses: State content correctly Wound/Skin Impairment: Methods: Explain/Verbal Responses: State content correctly Electronic Signature(s) Signed: 06/08/2020 5:57:30 PM By: Levan Hurst RN, BSN Entered By: Levan Hurst on 06/06/2020 13:38:21 -------------------------------------------------------------------------------- Wound Assessment Details Patient Name: Date of Service: Kristine Chestnut M. 06/06/2020 12:30 PM Medical Record Number: [id number] Patient Account Number: [id number] Date of Birth/Sex: Treating RN: 1931/10/08 (86 y.o. Orvan Falconer Primary Care Daiana Vitiello: [place], [place] Other Clinician: Referring Indria Bishara: Treating Ninette Cotta/Extender: Linton Ham [place] RRINGTO Delane Ginger, KIP Weeks in Treatment: 1 Wound Status Wound Number: 1 Primary Etiology: Venous Leg Ulcer Wound Location: Left, Proximal, Lateral Lower Leg Wound Status: Open Wounding Event: Trauma Comorbid History: Asthma Date Acquired: 03/24/2020 Weeks Of Treatment: 1 Clustered Wound: No Photos Photo Uploaded By: Mikeal Hawthorne on 06/08/2020 14:42:43 Wound Measurements Length: (cm) 0.2 Width: (cm) 0.2 Depth: (cm) 0.1 Area: (cm) 0.031 Volume: (cm) 0.003 % Reduction in Area: 90.1% % Reduction in Volume: 90.3% Epithelialization: None Tunneling: No Undermining: No Wound Description Classification: Full Thickness Without Exposed Support Structu Exudate Amount: Medium Exudate Type: Serosanguineous Exudate Color: red, brown res Foul Odor After Cleansing: No Slough/Fibrino Yes Wound Bed Granulation Amount: Medium (34-66%) Exposed Structure Granulation Quality: Red, Pink Fascia Exposed: No Necrotic Amount: Medium (34-66%) Fat Layer  (Subcutaneous Tissue) Exposed: Yes Necrotic Quality: Adherent Slough Tendon Exposed: No Muscle Exposed: No Joint Exposed: No Bone Exposed: No Treatment Notes Wound #1 (Left, Proximal, Lateral Lower Leg) 1. Cleanse With Wound Cleanser Soap and water 2. Periwound Care Moisturizing lotion 3. Primary Dressing Applied Hydrofera Blue 4. Secondary Dressing ABD Pad Dry Gauze 6. Support Layer Applied 3 layer compression wrap Notes netting. Electronic Signature(s) Signed: 06/13/2020 1:16:40 PM By: Carlene Coria RN Entered By: Carlene Coria on 06/06/2020 13:01:53 -------------------------------------------------------------------------------- Wound Assessment Details Patient Name: Date of Service: YERALDIN, LITZENBERGER. 06/06/2020 12:30 PM Medical Record Number: [id number] Patient Account Number: [id number] Date of Birth/Sex: Treating RN: 14-Aug-1932 (86 y.o. Orvan Falconer Primary Care Algie Westry: [place], [place] Other Clinician: Referring Semira Stoltzfus: Treating Aniylah Avans/Extender: Linton Ham [place] RRINGTO Delane Ginger, KIP Weeks in Treatment: 1 Wound Status Wound Number: 2 Primary Etiology: Venous Leg Ulcer Wound Location: Left, Distal Lower Leg Wound Status: Open Wounding Event: Trauma Comorbid History: Asthma Date Acquired: 03/24/2020 Weeks Of Treatment: 1 Clustered Wound: No Photos Photo Uploaded By: Mikeal Hawthorne on 06/08/2020 14:42:43 Wound Measurements Length: (cm) 2.6 Width: (cm) 1 Depth: (cm) 0.1 Area: (cm) 2.042 Volume: (cm) 0.204 % Reduction in Area: 38.1% % Reduction in Volume: 38.2% Epithelialization: Small (1-33%) Tunneling: No Undermining: No Wound Description Classification: Full Thickness Without Exposed Support Structures Exudate Amount: Medium Exudate Type: Serosanguineous Exudate Color: red, brown Foul Odor After Cleansing: No Slough/Fibrino Yes Wound Bed Granulation Amount: Medium (34-66%) Exposed Structure Granulation Quality: Red, Pink Fascia Exposed:  No Necrotic Amount: Medium (34-66%) Fat Layer (Subcutaneous Tissue) Exposed: Yes Necrotic Quality: Adherent Slough Tendon Exposed: No Muscle Exposed: No Joint Exposed: No Bone Exposed: No Electronic Signature(s) Signed: 06/13/2020 1:16:40 PM By: Carlene Coria RN Entered By: Carlene Coria on 06/06/2020 13:02:03 -------------------------------------------------------------------------------- Vitals Details Patient Name: Date of Service: Kristine Chestnut M. 06/06/2020 12:30 PM Medical Record Number: [id number] Patient Account Number: [id number]  Date of Birth/Sex: Treating RN: 07/15/1932 (86 y.o. Orvan Falconer Primary Care Masen Luallen: [place], [place] Other Clinician: Referring Tjuana Vickrey: Treating Margaretha Mahan/Extender: Carolynn Serve, KIP Weeks in Treatment: 1 Vital Signs Time Taken: 12:55 Temperature (F): 97.9 Height (in): 63 Pulse (bpm): 71 Weight (lbs): 167 Respiratory Rate (breaths/min): 18 Body Mass Index (BMI): 29.6 Blood Pressure (mmHg): 161/76 Reference Range: 80 - 120 mg / dl Electronic Signature(s) Signed: 06/13/2020 1:16:40 PM By: Carlene Coria RN Entered By: Carlene Coria on 06/06/2020 12:55:41

## 2020-06-13 NOTE — Progress Notes (Addendum)
ALYNNA, HARGROVE ([id number]) Visit Report for 06/13/2020 Arrival Information Details Patient Name: Date of Service: [name], [name]. 06/13/2020 1:00 PM Medical Record Number: [id number] Patient Account Number: [id number] Date of Birth/Sex: Treating RN: June 23, 1932 (86 y.o. F) Dwiggins, Larene Beach Primary Care Idell Hissong: [place] Anise [place], [place] Other Clinician: Referring Balen Woolum: Treating Breezy Hertenstein/Extender: Carolynn Serve, KIP Weeks in Treatment: 2 Visit Information History Since Last Visit Added or deleted any medications: No Patient Arrived: Walker Any new allergies or adverse reactions: No Arrival Time: 13:18 Had a fall or experienced change in No Accompanied By: family member activities of daily living that may affect Transfer Assistance: None risk of falls: Patient Identification Verified: Yes Signs or symptoms of abuse/neglect since last visito No Secondary Verification Process Completed: Yes Hospitalized since last visit: No Patient Requires Transmission-Based Precautions: No Implantable device outside of the clinic excluding No Patient Has Alerts: No cellular tissue based products placed in the center since last visit: Has Dressing in Place as Prescribed: Yes Has Compression in Place as Prescribed: Yes Pain Present Now: No Electronic Signature(s) Signed: 06/13/2020 1:33:30 PM By: Kela Millin Entered By: Kela Millin on 06/13/2020 13:19:25 -------------------------------------------------------------------------------- Compression Therapy Details Patient Name: Date of Service: Kristine Chestnut M. 06/13/2020 1:00 PM Medical Record Number: [id number] Patient Account Number: [id number] Date of Birth/Sex: Treating RN: 11-Jun-1932 (87 y.o. [name] Primary Care Evanthia Maund: [place] Secundino Ginger Other Clinician: Referring Rache Klimaszewski: Treating Danyale Ridinger/Extender: Carolynn Serve, KIP Weeks in Treatment: 2 Compression Therapy Performed for Wound  Assessment: Wound #2 Left,Distal,Lateral Lower Leg Performed By: Clinician Levan Hurst, RN Compression Type: Three Layer Post Procedure Diagnosis Same as Pre-procedure Electronic Signature(s) Signed: 06/13/2020 5:09:36 PM By: Levan Hurst RN, BSN Entered By: Levan Hurst on 06/13/2020 13:39:40 -------------------------------------------------------------------------------- Encounter Discharge Information Details Patient Name: Date of Service: Kristine Chestnut M. 06/13/2020 1:00 PM Medical Record Number: [id number] Patient Account Number: [id number] Date of Birth/Sex: Treating RN: 1932/02/26 (85 y.o. Debby Bud Primary Care Ama Mcmaster: [place], [place] Other Clinician: Referring Yi Falletta: Treating Havana Baldwin/Extender: Linton Ham [place], KIP Weeks in Treatment: 2 Encounter Discharge Information Items Discharge Condition: Stable Ambulatory Status: Walker Discharge Destination: Home Transportation: Private Auto Accompanied By: family member Schedule Follow-up Appointment: Yes Clinical Summary of Care: Electronic Signature(s) Signed: 06/13/2020 5:10:33 PM By: Deon Pilling Entered By: Deon Pilling on 06/13/2020 13:51:06 -------------------------------------------------------------------------------- Lower Extremity Assessment Details Patient Name: Date of Service: [name], ZILL. 06/13/2020 1:00 PM Medical Record Number: [id number] Patient Account Number: [id number] Date of Birth/Sex: Treating RN: October 27, 1931 (86 y.o. [name] Primary Care Elaisha Zahniser: [place], [place] Other Clinician: Referring Mitsue Peery: Treating Xavier Munger/Extender: Carolynn Serve, KIP Weeks in Treatment: 2 Edema Assessment Assessed: [Left: No] [Right: No] E[Left: dema] [Right: :] Calf Left: Right: Point of Measurement: 36 cm From Medial Instep 31 cm cm Ankle Left: Right: Point of Measurement: 9 cm From Medial Instep 20.5 cm cm Vascular  Assessment Pulses: Dorsalis Pedis Palpable: [Left:Yes] Electronic Signature(s) Signed: 06/13/2020 1:33:30 PM By: Kela Millin Entered By: Kela Millin on 06/13/2020 13:25:21 -------------------------------------------------------------------------------- Multi Wound Chart Details Patient Name: Date of Service: Kristine Chestnut M. 06/13/2020 1:00 PM Medical Record Number: [id number] Patient Account Number: [id number] Date of Birth/Sex: Treating RN: Dec 14, 1931 (85 y.o. [name] Primary Care Treydon Henricks: [place] Secundino Ginger Other Clinician: Referring Rion Schnitzer: Treating Aliena Ghrist/Extender: Carolynn Serve, KIP Weeks in Treatment: 2 Vital Signs Height(in): 63 Pulse(bpm): 66 Weight(lbs): 167 Blood Pressure(mmHg): 113/66 Body  Mass Index(BMI): 30 Temperature(F): 97.7 Respiratory Rate(breaths/min): 19 Photos: [1:No Photos Left, Proximal, Lateral Lower Leg] [2:No Photos Left, Distal, Lateral Lower Leg] [N/A:N/A N/A] Wound Location: [1:Trauma] [2:Trauma] [N/A:N/A] Wounding Event: [1:Venous Leg Ulcer] [2:Venous Leg Ulcer] [N/A:N/A] Primary Etiology: [1:Asthma] [2:Asthma] [N/A:N/A] Comorbid History: [1:03/24/2020] [2:03/24/2020] [N/A:N/A] Date Acquired: [1:2] [2:2] [N/A:N/A] Weeks of Treatment: [1:Healed - Epithelialized] [2:Open] [N/A:N/A] Wound Status: [1:0x0x0] [2:2x0.7x0.1] [N/A:N/A] Measurements L x W x D (cm) [1:0] [2:1.1] [N/A:N/A] A (cm) : rea [1:0] [2:0.11] [N/A:N/A] Volume (cm) : [1:100.00%] [2:66.70%] [N/A:N/A] % Reduction in A rea: [1:100.00%] [2:66.70%] [N/A:N/A] % Reduction in Volume: [1:Full Thickness Without Exposed] [2:Full Thickness Without Exposed] [N/A:N/A] Classification: [1:Support Structures None Present] [2:Support Structures Medium] [N/A:N/A] Exudate Amount: [1:N/A] [2:Serosanguineous] [N/A:N/A] Exudate Type: [1:N/A] [2:red, brown] [N/A:N/A] Exudate Color: [1:Distinct, outline attached] [2:Distinct, outline attached] [N/A:N/A] Wound  Margin: [1:None Present (0%)] [2:Large (67-100%)] [N/A:N/A] Granulation Amount: [1:N/A] [2:Red, Pink] [N/A:N/A] Granulation Quality: [1:None Present (0%)] [2:Small (1-33%)] [N/A:N/A] Necrotic Amount: [1:Fascia: No] [2:Fat Layer (Subcutaneous Tissue): Yes N/A] Exposed Structures: [1:Fat Layer (Subcutaneous Tissue): No Tendon: No Muscle: No Joint: No Bone: No Large (67-100%)] [2:Fascia: No Tendon: No Muscle: No Joint: No Bone: No Small (1-33%)] [N/A:N/A] Epithelialization: [1:N/A] [2:Compression Therapy] [N/A:N/A] Treatment Notes Wound #2 (Left, Distal, Lateral Lower Leg) 1. Cleanse With Wound Cleanser Soap and water 2. Periwound Care Moisturizing lotion 3. Primary Dressing Applied Hydrofera Blue 4. Secondary Dressing Dry Gauze 6. Support Layer Applied 3 layer compression wrap Notes netting. Electronic Signature(s) Signed: 06/13/2020 5:09:36 PM By: Levan Hurst RN, BSN Signed: 06/14/2020 4:19:57 PM By: Linton Ham MD Entered By: Linton Ham on 06/13/2020 14:01:24 -------------------------------------------------------------------------------- Multi-Disciplinary Care Plan Details Patient Name: Date of Service: Kristine Chestnut M. 06/13/2020 1:00 PM Medical Record Number: [id number] Patient Account Number: [id number] Date of Birth/Sex: Treating RN: Jun 29, 1932 (86 y.o. [name] Primary Care Kamyia Thomason: [place] Secundino Ginger Other Clinician: Referring Mycal Conde: Treating Marjani Kobel/Extender: Carolynn Serve, KIP Weeks in Treatment: 2 Active Inactive Venous Leg Ulcer Nursing Diagnoses: Knowledge deficit related to disease process and management Goals: Patient/caregiver will verbalize understanding of disease process and disease management Date Initiated: 05/27/2020 Target Resolution Date: 06/24/2020 Goal Status: Active Interventions: Compression as ordered Provide education on venous insufficiency Notes: Wound/Skin Impairment Nursing  Diagnoses: Impaired tissue integrity Goals: Ulcer/skin breakdown will have a volume reduction of 30% by week 4 Date Initiated: 05/27/2020 Target Resolution Date: 06/24/2020 Goal Status: Active Interventions: Provide education on ulcer and skin care Notes: Electronic Signature(s) Signed: 06/13/2020 5:09:36 PM By: Levan Hurst RN, BSN Entered By: Levan Hurst on 06/13/2020 16:23:01 -------------------------------------------------------------------------------- Pain Assessment Details Patient Name: Date of Service: Kristine Chestnut M. 06/13/2020 1:00 PM Medical Record Number: [id number] Patient Account Number: [id number] Date of Birth/Sex: Treating RN: 1932-07-18 (86 y.o. [name] Primary Care Adreanna Fickel: [place], [place] Other Clinician: Referring Bernice Mcauliffe: Treating Lacosta Hargan/Extender: Carolynn Serve, KIP Weeks in Treatment: 2 Active Problems Location of Pain Severity and Description of Pain Patient Has Paino No Site Locations Pain Management and Medication Current Pain Management: Electronic Signature(s) Signed: 06/13/2020 1:33:30 PM By: Kela Millin Entered By: Kela Millin on 06/13/2020 13:19:49 -------------------------------------------------------------------------------- Patient/Caregiver Education Details Patient Name: Date of Service: [name] 9/20/2021andnbsp1:00 PM Medical Record Number: [id number] Patient Account Number: [id number] Date of Birth/Gender: Treating RN: 15-May-1932 (86 y.o. [name] Primary Care Physician: [place], [place] Other Clinician: Referring Physician: Treating Physician/Extender: Cleotis Nipper Weeks in Treatment: 2 Education Assessment Education Provided To: Patient  Education Topics Provided Venous: Methods: Explain/Verbal Responses: State content correctly Wound/Skin Impairment: Methods: Explain/Verbal Responses: State content correctly Electronic  Signature(s) Signed: 06/13/2020 5:09:36 PM By: Levan Hurst RN, BSN Entered By: Levan Hurst on 06/13/2020 16:23:14 -------------------------------------------------------------------------------- Wound Assessment Details Patient Name: Date of Service: Kristine Chestnut M. 06/13/2020 1:00 PM Medical Record Number: [id number] Patient Account Number: [id number] Date of Birth/Sex: Treating RN: Apr 06, 1932 (87 y.o. [name] Primary Care Carlotta Telfair: [place], [place] Other Clinician: Referring Anelise Staron: Treating Lesly Pontarelli/Extender: Carolynn Serve, KIP Weeks in Treatment: 2 Wound Status Wound Number: 1 Primary Etiology: Venous Leg Ulcer Wound Location: Left, Proximal, Lateral Lower Leg Wound Status: Healed - Epithelialized Wounding Event: Trauma Comorbid History: Asthma Date Acquired: 03/24/2020 Weeks Of Treatment: 2 Clustered Wound: No Wound Measurements Length: (cm) Width: (cm) Depth: (cm) Area: (cm) Volume: (cm) 0 % Reduction in Area: 100% 0 % Reduction in Volume: 100% 0 Epithelialization: Large (67-100%) 0 Tunneling: No 0 Undermining: No Wound Description Classification: Full Thickness Without Exposed Support Structures Wound Margin: Distinct, outline attached Exudate Amount: None Present Foul Odor After Cleansing: No Slough/Fibrino No Wound Bed Granulation Amount: None Present (0%) Exposed Structure Necrotic Amount: None Present (0%) Fascia Exposed: No Fat Layer (Subcutaneous Tissue) Exposed: No Tendon Exposed: No Muscle Exposed: No Joint Exposed: No Bone Exposed: No Electronic Signature(s) Signed: 06/13/2020 1:33:30 PM By: Kela Millin Entered By: Kela Millin on 06/13/2020 13:27:32 -------------------------------------------------------------------------------- Wound Assessment Details Patient Name: Date of Service: Kristine Chestnut M. 06/13/2020 1:00 PM Medical Record Number: [id number] Patient Account Number: [id number] Date  of Birth/Sex: Treating RN: 03/11/1932 (86 y.o. [name] Primary Care Eriko Economos: [place], [place] Other Clinician: Referring Theon Sobotka: Treating Neviah Braud/Extender: Carolynn Serve, KIP Weeks in Treatment: 2 Wound Status Wound Number: 2 Primary Etiology: Venous Leg Ulcer Wound Location: Left, Distal, Lateral Lower Leg Wound Status: Open Wounding Event: Trauma Comorbid History: Asthma Date Acquired: 03/24/2020 Weeks Of Treatment: 2 Clustered Wound: No Wound Measurements Length: (cm) 2 Width: (cm) 0.7 Depth: (cm) 0.1 Area: (cm) 1.1 Volume: (cm) 0.11 % Reduction in Area: 66.7% % Reduction in Volume: 66.7% Epithelialization: Small (1-33%) Tunneling: No Undermining: No Wound Description Classification: Full Thickness Without Exposed Support Structures Wound Margin: Distinct, outline attached Exudate Amount: Medium Exudate Type: Serosanguineous Exudate Color: red, brown Foul Odor After Cleansing: No Slough/Fibrino Yes Wound Bed Granulation Amount: Large (67-100%) Exposed Structure Granulation Quality: Red, Pink Fascia Exposed: No Necrotic Amount: Small (1-33%) Fat Layer (Subcutaneous Tissue) Exposed: Yes Necrotic Quality: Adherent Slough Tendon Exposed: No Muscle Exposed: No Joint Exposed: No Bone Exposed: No Treatment Notes Wound #2 (Left, Distal, Lateral Lower Leg) 1. Cleanse With Wound Cleanser Soap and water 2. Periwound Care Moisturizing lotion 3. Primary Dressing Applied Hydrofera Blue 4. Secondary Dressing Dry Gauze 6. Support Layer Applied 3 layer compression wrap Notes netting. Electronic Signature(s) Signed: 06/13/2020 1:33:30 PM By: Kela Millin Entered By: Kela Millin on 06/13/2020 13:28:18 -------------------------------------------------------------------------------- Vitals Details Patient Name: Date of Service: Kristine Chestnut M. 06/13/2020 1:00 PM Medical Record Number: [id number] Patient Account Number:  [id number] Date of Birth/Sex: Treating RN: 1932-03-17 (86 y.o. [name] Primary Care Tyneisha Hegeman: [place], [place] Other Clinician: Referring Kosei Rhodes: Treating Damaree Sargent/Extender: Carolynn Serve, KIP Weeks in Treatment: 2 Vital Signs Time Taken: 13:19 Temperature (F): 97.7 Height (in): 63 Pulse (bpm): 66 Weight (lbs): 167 Respiratory Rate (breaths/min): 19 Body Mass Index (BMI): 29.6 Blood Pressure (mmHg): 113/66 Reference Range: 80 - 120 mg / dl Electronic  Signature(s) Signed: 06/13/2020 1:33:30 PM By: Kela Millin Entered By: Kela Millin on 06/13/2020 13:19:45
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
## 2020-06-20 ENCOUNTER — Other Ambulatory Visit: Payer: Self-pay

## 2020-06-20 ENCOUNTER — Encounter (HOSPITAL_BASED_OUTPATIENT_CLINIC_OR_DEPARTMENT_OTHER): Payer: Medicare Other | Admitting: Internal Medicine

## 2020-06-20 DIAGNOSIS — I87332 Chronic venous hypertension (idiopathic) with ulcer and inflammation of left lower extremity: Secondary | ICD-10-CM | POA: Diagnosis not present

## 2020-06-20 NOTE — Progress Notes (Addendum)
Kristine, [name] ([id number]) Visit Report for 06/20/2020 Arrival Information Details Patient Name: Date of Service: [name], [name]. 06/20/2020 2:30 PM Medical Record Number: [id number] Patient Account Number: [id number] Date of Birth/Sex: Treating RN: [birth year] (86 y.o. F) Dwiggins, Larene Beach Primary Care Yuritza Paulhus: [place] Anise [place], [place] Other Clinician: Referring Jamerius Boeckman: Treating Lestine Rahe/Extender: Carolynn Serve, KIP Weeks in Treatment: 3 Visit Information History Since Last Visit Added or deleted any medications: No Patient Arrived: Walker Any new allergies or adverse reactions: No Arrival Time: 14:48 Had a fall or experienced change in No Accompanied By: family member activities of daily living that may affect Transfer Assistance: None risk of falls: Patient Identification Verified: Yes Signs or symptoms of abuse/neglect since last visito No Secondary Verification Process Completed: Yes Hospitalized since last visit: No Patient Requires Transmission-Based Precautions: No Implantable device outside of the clinic excluding No Patient Has Alerts: No cellular tissue based products placed in the center since last visit: Has Dressing in Place as Prescribed: Yes Has Compression in Place as Prescribed: Yes Pain Present Now: No Electronic Signature(s) Signed: 06/20/2020 5:24:24 PM By: Kela Millin Entered By: Kela Millin on 06/20/2020 14:48:34 -------------------------------------------------------------------------------- Compression Therapy Details Patient Name: Date of Service: [name]. 06/20/2020 2:30 PM Medical Record Number: [id number] Patient Account Number: [id number] Date of Birth/Sex: Treating RN: 1932/02/03 (86 y.o. Nancy Fetter Primary Care Emnet Monk: [place] Secundino Ginger Other Clinician: Referring Siriah Treat: Treating Nuri Larmer/Extender: Carolynn Serve, KIP Weeks in Treatment: 3 Compression Therapy Performed for Wound  Assessment: Wound #2 Left,Distal,Lateral Lower Leg Performed By: Clinician Levan Hurst, RN Compression Type: Three Layer Post Procedure Diagnosis Same as Pre-procedure Electronic Signature(s) Signed: 06/20/2020 5:31:38 PM By: Levan Hurst RN, BSN Entered By: Levan Hurst on 06/20/2020 16:02:32 -------------------------------------------------------------------------------- Encounter Discharge Information Details Patient Name: Date of Service: Kristine Chestnut M. 06/20/2020 2:30 PM Medical Record Number: [id number] Patient Account Number: [id number] Date of Birth/Sex: Treating RN: [birth year] (86 y.o. Debby Bud Primary Care Judah Chevere: [place], [place] Other Clinician: Referring Advait Buice: Treating Keshonda Monsour/Extender: Linton Ham [place], KIP Weeks in Treatment: 3 Encounter Discharge Information Items Discharge Condition: Stable Ambulatory Status: Walker Discharge Destination: Home Transportation: Private Auto Accompanied By: friend Schedule Follow-up Appointment: Yes Clinical Summary of Care: Electronic Signature(s) Signed: 06/20/2020 5:26:25 PM By: Deon Pilling Entered By: Deon Pilling on 06/20/2020 17:14:51 -------------------------------------------------------------------------------- Lower Extremity Assessment Details Patient Name: Date of Service: [name], [name] 06/20/2020 2:30 PM Medical Record Number: [id number] Patient Account Number: [id number] Date of Birth/Sex: Treating RN: 30-Jun-1932 (86 y.o. [name] Primary Care Elecia Serafin: [place] Secundino Ginger Other Clinician: Referring Niya Behler: Treating Bess Saltzman/Extender: Carolynn Serve, KIP Weeks in Treatment: 3 Edema Assessment Assessed: [Left: No] [Right: No] Edema: [Left: Ye] [Right: s] Calf Left: Right: Point of Measurement: 36 cm From Medial Instep 33 cm Ankle Left: Right: Point of Measurement: 9 cm From Medial Instep 21.5 cm Vascular Assessment Pulses: Dorsalis  Pedis Palpable: [Left:Yes] Electronic Signature(s) Signed: 06/20/2020 5:24:24 PM By: Kela Millin Entered By: Kela Millin on 06/20/2020 14:50:53 -------------------------------------------------------------------------------- Multi Wound Chart Details Patient Name: Date of Service: [name]. 06/20/2020 2:30 PM Medical Record Number: [id number] Patient Account Number: [id number] Date of Birth/Sex: Treating RN: 24-Jul-1932 (86 y.o. Nancy Fetter Primary Care Camillia Marcy: [place] Secundino Ginger Other Clinician: Referring Kwadwo Taras: Treating Lynford Espinoza/Extender: Carolynn Serve, KIP Weeks in Treatment: 3 Vital Signs Height(in): 63 Pulse(bpm): 67 Weight(lbs): 167 Blood Pressure(mmHg): 116/58 Body Mass Index(BMI):  30 Temperature(F): 97.5 Respiratory Rate(breaths/min): 19 Photos: [2:No Photos Left, Distal, Lateral Lower Leg] [N/A:N/A N/A] Wound Location: [2:Trauma] [N/A:N/A] Wounding Event: [2:Venous Leg Ulcer] [N/A:N/A] Primary Etiology: [2:Asthma] [N/A:N/A] Comorbid History: [2:03/24/2020] [N/A:N/A] Date Acquired: [2:3] [N/A:N/A] Weeks of Treatment: [2:Open] [N/A:N/A] Wound Status: [2:0.4x0.4x0.1] [N/A:N/A] Measurements L x W x D (cm) [2:0.126] [N/A:N/A] A (cm) : rea [2:0.013] [N/A:N/A] Volume (cm) : [2:96.20%] [N/A:N/A] % Reduction in A rea: [2:96.10%] [N/A:N/A] % Reduction in Volume: [2:Full Thickness Without Exposed] [N/A:N/A] Classification: [2:Support Structures Medium] [N/A:N/A] Exudate Amount: [2:Serosanguineous] [N/A:N/A] Exudate Type: [2:red, brown] [N/A:N/A] Exudate Color: [2:Distinct, outline attached] [N/A:N/A] Wound Margin: [2:Large (67-100%)] [N/A:N/A] Granulation Amount: [2:Red, Pink] [N/A:N/A] Granulation Quality: [2:Small (1-33%)] [N/A:N/A] Necrotic Amount: [2:Fat Layer (Subcutaneous Tissue): Yes N/A] Exposed Structures: [2:Fascia: No Tendon: No Muscle: No Joint: No Bone: No Small (1-33%)] [N/A:N/A] Epithelialization: [2:Compression  Therapy] [N/A:N/A] Treatment Notes Wound #2 (Left, Distal, Lateral Lower Leg) 1. Cleanse With Wound Cleanser Soap and water 2. Periwound Care Moisturizing lotion 3. Primary Dressing Applied Hydrofera Blue 4. Secondary Dressing Dry Gauze 6. Support Layer Applied 3 layer compression wrap Notes netting. Electronic Signature(s) Signed: 06/21/2020 7:37:15 AM By: Linton Ham MD Signed: 06/21/2020 11:42:07 AM By: Levan Hurst RN, BSN Entered By: Linton Ham on 06/21/2020 03:57:01 -------------------------------------------------------------------------------- Multi-Disciplinary Care Plan Details Patient Name: Date of Service: Kristine Chestnut M. 06/20/2020 2:30 PM Medical Record Number: [id number] Patient Account Number: [id number] Date of Birth/Sex: Treating RN: [birth year] (86 y.o. Nancy Fetter Primary Care Shuntay Everetts: [place] Secundino Ginger Other Clinician: Referring Haniya Fern: Treating Yilia Sacca/Extender: Carolynn Serve, KIP Weeks in Treatment: 3 Active Inactive Wound/Skin Impairment Nursing Diagnoses: Impaired tissue integrity Goals: Patient/caregiver will verbalize understanding of skin care regimen Date Initiated: 06/20/2020 Target Resolution Date: 07/22/2020 Goal Status: Active Ulcer/skin breakdown will have a volume reduction of 30% by week 4 Date Initiated: 05/27/2020 Date Inactivated: 06/20/2020 Target Resolution Date: 06/24/2020 Goal Status: Met Interventions: Provide education on ulcer and skin care Notes: Electronic Signature(s) Signed: 06/20/2020 5:31:38 PM By: Levan Hurst RN, BSN Entered By: Levan Hurst on 06/20/2020 17:26:45 -------------------------------------------------------------------------------- Pain Assessment Details Patient Name: Date of Service: Kristine Chestnut M. 06/20/2020 2:30 PM Medical Record Number: [id number] Patient Account Number: [id number] Date of Birth/Sex: Treating RN: 05/13/1932 (86 y.o. [name] Primary Care Dick Hark: [place], [place] Other Clinician: Referring Aylani Spurlock: Treating Brinda Focht/Extender: Carolynn Serve, KIP Weeks in Treatment: 3 Active Problems Location of Pain Severity and Description of Pain Patient Has Paino No Site Locations Pain Management and Medication Current Pain Management: Electronic Signature(s) Signed: 06/20/2020 5:24:24 PM By: Kela Millin Entered By: Kela Millin on 06/20/2020 14:49:15 -------------------------------------------------------------------------------- Patient/Caregiver Education Details Patient Name: Date of Service: [name] 9/27/2021andnbsp2:30 PM Medical Record Number: [id number] Patient Account Number: [id number] Date of Birth/Gender: Treating RN: 04/29/1932 (86 y.o. Nancy Fetter Primary Care Physician: [place], [place] Other Clinician: Referring Physician: Treating Physician/Extender: Cleotis Nipper Weeks in Treatment: 3 Education Assessment Education Provided To: Patient Education Topics Provided Wound/Skin Impairment: Methods: Explain/Verbal Responses: State content correctly Electronic Signature(s) Signed: 06/20/2020 5:31:38 PM By: Levan Hurst RN, BSN Entered By: Levan Hurst on 06/20/2020 17:26:53 -------------------------------------------------------------------------------- Wound Assessment Details Patient Name: Date of Service: Kristine Chestnut M. 06/20/2020 2:30 PM Medical Record Number: [id number] Patient Account Number: [id number] Date of Birth/Sex: Treating RN: 07/03/1932 (86 y.o. [name] Primary Care Alger Kerstein: [place] Secundino Ginger Other Clinician: Referring Markia Kyer: Treating Lauris Keepers/Extender: Linton Ham [place] Delane Ginger, KIP Weeks in  Treatment: 3 Wound Status Wound Number: 2 Primary Etiology: Venous Leg Ulcer Wound Location: Left, Distal, Lateral Lower Leg Wound Status: Open Wounding Event: Trauma Comorbid  History: Asthma Date Acquired: 03/24/2020 Weeks Of Treatment: 3 Clustered Wound: No Wound Measurements Length: (cm) 0.4 Width: (cm) 0.4 Depth: (cm) 0.1 Area: (cm) 0.126 Volume: (cm) 0.013 % Reduction in Area: 96.2% % Reduction in Volume: 96.1% Epithelialization: Small (1-33%) Tunneling: No Undermining: No Wound Description Classification: Full Thickness Without Exposed Support Structures Wound Margin: Distinct, outline attached Exudate Amount: Medium Exudate Type: Serosanguineous Exudate Color: red, brown Foul Odor After Cleansing: No Slough/Fibrino Yes Wound Bed Granulation Amount: Large (67-100%) Exposed Structure Granulation Quality: Red, Pink Fascia Exposed: No Necrotic Amount: Small (1-33%) Fat Layer (Subcutaneous Tissue) Exposed: Yes Necrotic Quality: Adherent Slough Tendon Exposed: No Muscle Exposed: No Joint Exposed: No Bone Exposed: No Treatment Notes Wound #2 (Left, Distal, Lateral Lower Leg) 1. Cleanse With Wound Cleanser Soap and water 2. Periwound Care Moisturizing lotion 3. Primary Dressing Applied Hydrofera Blue 4. Secondary Dressing Dry Gauze 6. Support Layer Applied 3 layer compression wrap Notes netting. Electronic Signature(s) Signed: 06/20/2020 5:24:24 PM By: Kela Millin Entered By: Kela Millin on 06/20/2020 14:52:16 -------------------------------------------------------------------------------- Vitals Details Patient Name: Date of Service: Kristine Chestnut M. 06/20/2020 2:30 PM Medical Record Number: [id number] Patient Account Number: [id number] Date of Birth/Sex: Treating RN: [birth year] (86 y.o. [name] Primary Care Chadd Tollison: [place], [place] Other Clinician: Referring Faduma Cho: Treating Hayward Rylander/Extender: Carolynn Serve, KIP Weeks in Treatment: 3 Vital Signs Time Taken: 14:45 Temperature (F): 97.5 Height (in): 63 Pulse (bpm): 67 Weight (lbs): 167 Respiratory Rate (breaths/min): 19 Body  Mass Index (BMI): 29.6 Blood Pressure (mmHg): 116/58 Reference Range: 80 - 120 mg / dl Electronic Signature(s) Signed: 06/20/2020 5:24:24 PM By: Kela Millin Entered By: Kela Millin on 06/20/2020 14:49:09
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
## 2020-06-27 ENCOUNTER — Encounter (HOSPITAL_BASED_OUTPATIENT_CLINIC_OR_DEPARTMENT_OTHER): Payer: Medicare Other | Attending: Internal Medicine | Admitting: Internal Medicine

## 2020-06-27 DIAGNOSIS — I872 Venous insufficiency (chronic) (peripheral): Secondary | ICD-10-CM | POA: Insufficient documentation

## 2020-06-27 DIAGNOSIS — L97822 Non-pressure chronic ulcer of other part of left lower leg with fat layer exposed: Secondary | ICD-10-CM | POA: Insufficient documentation

## 2020-06-27 DIAGNOSIS — J45909 Unspecified asthma, uncomplicated: Secondary | ICD-10-CM | POA: Insufficient documentation

## 2020-06-27 DIAGNOSIS — I129 Hypertensive chronic kidney disease with stage 1 through stage 4 chronic kidney disease, or unspecified chronic kidney disease: Secondary | ICD-10-CM | POA: Insufficient documentation

## 2020-06-27 DIAGNOSIS — D509 Iron deficiency anemia, unspecified: Secondary | ICD-10-CM | POA: Insufficient documentation

## 2020-06-27 DIAGNOSIS — E781 Pure hyperglyceridemia: Secondary | ICD-10-CM | POA: Insufficient documentation

## 2020-06-27 DIAGNOSIS — N184 Chronic kidney disease, stage 4 (severe): Secondary | ICD-10-CM | POA: Insufficient documentation
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
## 2020-06-28 NOTE — Progress Notes (Signed)
[name], [name] ([id number]) Visit Report for 06/27/2020 Arrival Information Details Patient Name: Date of Service: [name], [name]. 06/27/2020 3:15 PM Medical Record Number: [id number] Patient Account Number: [id number] Date of Birth/Sex: Treating RN: 02-23-1932 (86 y.o. Martyn Malay, Linda Primary Care Moiz Ryant: [place], [place] Other Clinician: Referring Hiral Lukasiewicz: Treating Neelam Tiggs/Extender: Carolynn Serve, KIP Weeks in Treatment: 4 Visit Information History Since Last Visit Added or deleted any medications: No Patient Arrived: Gilford Rile Any new allergies or adverse reactions: No Arrival Time: 15:17 Had a fall or experienced change in No Accompanied By: daughter activities of daily living that may affect Transfer Assistance: None risk of falls: Patient Identification Verified: Yes Signs or symptoms of abuse/neglect since last visito No Secondary Verification Process Completed: Yes Hospitalized since last visit: No Patient Requires Transmission-Based Precautions: No Implantable device outside of the clinic excluding No Patient Has Alerts: No cellular tissue based products placed in the center since last visit: Has Dressing in Place as Prescribed: Yes Pain Present Now: Yes Electronic Signature(s) Signed: 06/28/2020 2:32:03 PM By: Sandre Kitty Entered By: Sandre Kitty on 06/27/2020 15:18:01 -------------------------------------------------------------------------------- Clinic Level of Care Assessment Details Patient Name: Date of Service: Kristine, [name] 06/27/2020 3:15 PM Medical Record Number: [id number] Patient Account Number: [id number] Date of Birth/Sex: Treating RN: 05/04/1932 (86 y.o. Elam Dutch Primary Care Delanda Bulluck: [place], [place] Other Clinician: Referring Skyleigh Windle: Treating Hetty Linhart/Extender: Carolynn Serve, KIP Weeks in Treatment: 4 Clinic Level of Care Assessment Items TOOL 4 Quantity Score []  - 0 Use when  only an EandM is performed on FOLLOW-UP visit ASSESSMENTS - Nursing Assessment / Reassessment X- 1 10 Reassessment of Co-morbidities (includes updates in patient status) X- 1 5 Reassessment of Adherence to Treatment Plan ASSESSMENTS - Wound and Skin A ssessment / Reassessment X - Simple Wound Assessment / Reassessment - one wound 1 5 []  - 0 Complex Wound Assessment / Reassessment - multiple wounds []  - 0 Dermatologic / Skin Assessment (not related to wound area) ASSESSMENTS - Focused Assessment X- 1 5 Circumferential Edema Measurements - multi extremities []  - 0 Nutritional Assessment / Counseling / Intervention X- 1 5 Lower Extremity Assessment (monofilament, tuning fork, pulses) []  - 0 Peripheral Arterial Disease Assessment (using hand held doppler) ASSESSMENTS - Ostomy and/or Continence Assessment and Care []  - 0 Incontinence Assessment and Management []  - 0 Ostomy Care Assessment and Management (repouching, etc.) PROCESS - Coordination of Care X - Simple Patient / Family Education for ongoing care 1 15 []  - 0 Complex (extensive) Patient / Family Education for ongoing care X- 1 10 Staff obtains Programmer, systems, Records, T Results / Process Orders est []  - 0 Staff telephones HHA, Nursing Homes / Clarify orders / etc []  - 0 Routine Transfer to another Facility (non-emergent condition) []  - 0 Routine Hospital Admission (non-emergent condition) []  - 0 New Admissions / Biomedical engineer / Ordering NPWT Apligraf, etc. , []  - 0 Emergency Hospital Admission (emergent condition) X- 1 10 Simple Discharge Coordination []  - 0 Complex (extensive) Discharge Coordination PROCESS - Special Needs []  - 0 Pediatric / Minor Patient Management []  - 0 Isolation Patient Management []  - 0 Hearing / Language / Visual special needs []  - 0 Assessment of Community assistance (transportation, D/C planning, etc.) []  - 0 Additional assistance / Altered mentation []  - 0 Support  Surface(s) Assessment (bed, cushion, seat, etc.) INTERVENTIONS - Wound Cleansing / Measurement X - Simple Wound Cleansing - one wound 1 5 []  - 0  Complex Wound Cleansing - multiple wounds X- 1 5 Wound Imaging (photographs - any number of wounds) []  - 0 Wound Tracing (instead of photographs) []  - 0 Simple Wound Measurement - one wound []  - 0 Complex Wound Measurement - multiple wounds INTERVENTIONS - Wound Dressings []  - 0 Small Wound Dressing one or multiple wounds []  - 0 Medium Wound Dressing one or multiple wounds []  - 0 Large Wound Dressing one or multiple wounds []  - 0 Application of Medications - topical []  - 0 Application of Medications - injection INTERVENTIONS - Miscellaneous []  - 0 External ear exam []  - 0 Specimen Collection (cultures, biopsies, blood, body fluids, etc.) []  - 0 Specimen(s) / Culture(s) sent or taken to Lab for analysis []  - 0 Patient Transfer (multiple staff / Civil Service fast streamer / Similar devices) []  - 0 Simple Staple / Suture removal (25 or less) []  - 0 Complex Staple / Suture removal (26 or more) []  - 0 Hypo / Hyperglycemic Management (close monitor of Blood Glucose) []  - 0 Ankle / Brachial Index (ABI) - do not check if billed separately X- 1 5 Vital Signs Has the patient been seen at the hospital within the last three years: Yes Total Score: 80 Level Of Care: New/Established - Level 3 Electronic Signature(s) Signed: 06/27/2020 5:03:51 PM By: Baruch Gouty RN, BSN Entered By: Baruch Gouty on 06/27/2020 16:12:59 -------------------------------------------------------------------------------- Encounter Discharge Information Details Patient Name: Date of Service: Kristine Chestnut M. 06/27/2020 3:15 PM Medical Record Number: [id number] Patient Account Number: [id number] Date of Birth/Sex: Treating RN: 1932-07-10 (86 y.o. Elam Dutch Primary Care Schneider Warchol: [place], [place] Other Clinician: Referring Demian Maisel: Treating Marites Nath/Extender:  Linton Ham [place], KIP Weeks in Treatment: 4 Encounter Discharge Information Items Discharge Condition: Stable Ambulatory Status: Walker Discharge Destination: Home Transportation: Private Auto Accompanied By: Charolett Bumpers Schedule Follow-up Appointment: Yes Clinical Summary of Care: Patient Declined Electronic Signature(s) Signed: 06/27/2020 5:03:51 PM By: Baruch Gouty RN, BSN Entered By: Baruch Gouty on 06/27/2020 16:26:09 -------------------------------------------------------------------------------- Lower Extremity Assessment Details Patient Name: Date of Service: [name] 06/27/2020 3:15 PM Medical Record Number: [id number] Patient Account Number: [id number] Date of Birth/Sex: Treating RN: [birth year] (86 y.o. [name] Primary Care Cathlyn Tersigni: [place] Secundino Ginger Other Clinician: Referring Janye Maynor: Treating Todd Jelinski/Extender: Linton Ham [place]ING[place] Delane Ginger, KIP Weeks in Treatment: 4 Edema Assessment Assessed: [Left: No] [Right: No] Edema: [Left: N] [Right: o] Calf Left: Right: Point of Measurement: From Medial Instep 33 cm Ankle Left: Right: Point of Measurement: From Medial Instep 21 cm Vascular Assessment Pulses: Dorsalis Pedis Palpable: [Left:Yes] Electronic Signature(s) Signed: 06/27/2020 5:08:20 PM By: Kela Millin Entered By: Kela Millin on 06/27/2020 15:26:07 -------------------------------------------------------------------------------- Multi Wound Chart Details Patient Name: Date of Service: [name] 06/27/2020 3:15 PM Medical Record Number: [id number] Patient Account Number: [id number] Date of Birth/Sex: Treating RN: 1932/01/20 (86 y.o. Elam Dutch Primary Care Taurus Willis: [place], [place] Other Clinician: Referring Anaelle Dunton: Treating Rui Wordell/Extender: Carolynn Serve, KIP Weeks in Treatment: 4 Vital Signs Height(in): 63 Pulse(bpm): 53 Weight(lbs): 167 Blood Pressure(mmHg):  137/64 Body Mass Index(BMI): 30 Temperature(F): 97.7 Respiratory Rate(breaths/min): 19 Photos: [2:No Photos Left, Distal, Lateral Lower Leg] [N/A:N/A N/A] Wound Location: [2:Trauma] [N/A:N/A] Wounding Event: [2:Venous Leg Ulcer] [N/A:N/A] Primary Etiology: [2:Asthma] [N/A:N/A] Comorbid History: [2:03/24/2020] [N/A:N/A] Date Acquired: [2:4] [N/A:N/A] Weeks of Treatment: [2:Healed - Epithelialized] [N/A:N/A] Wound Status: [2:0x0x0] [N/A:N/A] Measurements L x W x D (cm) [2:0] [N/A:N/A] A (cm) : rea [2:0] [N/A:N/A] Volume (cm) : [2:100.00%] [  N/A:N/A] % Reduction in A rea: [2:100.00%] [N/A:N/A] % Reduction in Volume: [2:Full Thickness Without Exposed] [N/A:N/A] Classification: [2:Support Structures None Present] [N/A:N/A] Exudate Amount: [2:Distinct, outline attached] [N/A:N/A] Wound Margin: [2:None Present (0%)] [N/A:N/A] Granulation Amount: [2:None Present (0%)] [N/A:N/A] Necrotic Amount: [2:Fascia: No] [N/A:N/A] Exposed Structures: [2:Fat Layer (Subcutaneous Tissue): No Tendon: No Muscle: No Joint: No Bone: No Large (67-100%)] [N/A:N/A] Treatment Notes Electronic Signature(s) Signed: 06/27/2020 4:54:01 PM By: Linton Ham MD Signed: 06/27/2020 5:03:51 PM By: Baruch Gouty RN, BSN Entered By: Linton Ham on 06/27/2020 16:44:54 -------------------------------------------------------------------------------- Multi-Disciplinary Care Plan Details Patient Name: Date of Service: Kristine Chestnut M. 06/27/2020 3:15 PM Medical Record Number: [id number] Patient Account Number: [id number] Date of Birth/Sex: Treating RN: 1932-06-07 (86 y.o. Elam Dutch Primary Care Lamoine Fredricksen: [place], [place] Other Clinician: Referring Yaffa Seckman: Treating Venba Zenner/Extender: Carolynn Serve, KIP Weeks in Treatment: 4 Active Inactive Electronic Signature(s) Signed: 06/27/2020 5:03:51 PM By: Baruch Gouty RN, BSN Entered By: Baruch Gouty on 06/27/2020  16:12:13 -------------------------------------------------------------------------------- Pain Assessment Details Patient Name: Date of Service: [name] 06/27/2020 3:15 PM Medical Record Number: [id number] Patient Account Number: [id number] Date of Birth/Sex: Treating RN: 1932-05-16 (86 y.o. Elam Dutch Primary Care Mosella Kasa: [place], [place] Other Clinician: Referring Asti Mackley: Treating Zanna Hawn/Extender: Carolynn Serve, KIP Weeks in Treatment: 4 Active Problems Location of Pain Severity and Description of Pain Patient Has Paino Yes Site Locations Rate the pain. Current Pain Level: 5 Pain Management and Medication Current Pain Management: Electronic Signature(s) Signed: 06/27/2020 5:03:51 PM By: Baruch Gouty RN, BSN Signed: 06/28/2020 2:32:03 PM By: Sandre Kitty Entered By: Sandre Kitty on 06/27/2020 15:18:32 -------------------------------------------------------------------------------- Patient/Caregiver Education Details Patient Name: Date of Service: Morton Peters 10/4/2021andnbsp3:15 PM Medical Record Number: [id number] Patient Account Number: [id number] Date of Birth/Gender: Treating RN: [birth year] (86 y.o. Elam Dutch Primary Care Physician: [place], [place] Other Clinician: Referring Physician: Treating Physician/Extender: Cleotis Nipper Weeks in Treatment: 4 Education Assessment Education Provided To: Patient Education Topics Provided Venous: Methods: Explain/Verbal Responses: Reinforcements needed, State content correctly Wound/Skin Impairment: Methods: Explain/Verbal Responses: Reinforcements needed, State content correctly Electronic Signature(s) Signed: 06/27/2020 5:03:51 PM By: Baruch Gouty RN, BSN Entered By: Baruch Gouty on 06/27/2020 15:20:50 -------------------------------------------------------------------------------- Wound Assessment Details Patient Name: Date of  Service: [name] 06/27/2020 3:15 PM Medical Record Number: [id number] Patient Account Number: [id number] Date of Birth/Sex: Treating RN: August 09, 1932 (86 y.o. Elam Dutch Primary Care Genell Thede: [place], [place] Other Clinician: Referring Jarius Dieudonne: Treating Ernisha Sorn/Extender: Linton Ham [place] Delane Ginger, KIP Weeks in Treatment: 4 Wound Status Wound Number: 2 Primary Etiology: Venous Leg Ulcer Wound Location: Left, Distal, Lateral Lower Leg Wound Status: Healed - Epithelialized Wounding Event: Trauma Comorbid History: Asthma Date Acquired: 03/24/2020 Weeks Of Treatment: 4 Clustered Wound: No Wound Measurements Length: (cm) Width: (cm) Depth: (cm) Area: (cm) Volume: (cm) 0 % Reduction in Area: 100% 0 % Reduction in Volume: 100% 0 Epithelialization: Large (67-100%) 0 Tunneling: No 0 Undermining: No Wound Description Classification: Full Thickness Without Exposed Support Structures Wound Margin: Distinct, outline attached Exudate Amount: None Present Foul Odor After Cleansing: No Slough/Fibrino No Wound Bed Granulation Amount: None Present (0%) Exposed Structure Necrotic Amount: None Present (0%) Fascia Exposed: No Fat Layer (Subcutaneous Tissue) Exposed: No Tendon Exposed: No Muscle Exposed: No Joint Exposed: No Bone Exposed: No Electronic Signature(s) Signed: 06/27/2020 5:03:51 PM By: Baruch Gouty RN, BSN Signed: 06/27/2020 5:08:20 PM By: Kela Millin Entered By: Verita Schneiders,  Larene Beach on 06/27/2020 15:27:21 -------------------------------------------------------------------------------- Vitals Details Patient Name: Date of Service: KYANN, HEYDT. 06/27/2020 3:15 PM Medical Record Number: [id number] Patient Account Number: [id number] Date of Birth/Sex: Treating RN: 03-05-1932 (86 y.o. Elam Dutch Primary Care Ivann Trimarco: [place], [place] Other Clinician: Referring Seon Gaertner: Treating Aubria Vanecek/Extender: Carolynn Serve,  KIP Weeks in Treatment: 4 Vital Signs Time Taken: 15:18 Temperature (F): 97.7 Height (in): 63 Pulse (bpm): 53 Weight (lbs): 167 Respiratory Rate (breaths/min): 19 Body Mass Index (BMI): 29.6 Blood Pressure (mmHg): 137/64 Reference Range: 80 - 120 mg / dl Electronic Signature(s) Signed: 06/28/2020 2:32:03 PM By: Sandre Kitty Entered By: Sandre Kitty on 06/27/2020 15:18:24

## 2020-09-27 ENCOUNTER — Other Ambulatory Visit: Payer: Self-pay | Admitting: Family Medicine

## 2020-09-27 DIAGNOSIS — Z1231 Encounter for screening mammogram for malignant neoplasm of breast: Secondary | ICD-10-CM

## 2021-01-03 ENCOUNTER — Other Ambulatory Visit: Payer: Self-pay | Admitting: Orthopedic Surgery

## 2021-01-03 DIAGNOSIS — Z01818 Encounter for other preprocedural examination: Secondary | ICD-10-CM

## 2021-01-05 ENCOUNTER — Encounter (HOSPITAL_COMMUNITY): Payer: Self-pay

## 2021-01-05 NOTE — Patient Instructions (Addendum)
DUE TO COVID-19 ONLY ONE VISITOR IS ALLOWED TO COME WITH YOU AND STAY IN THE WAITING ROOM ONLY DURING PRE OP AND PROCEDURE.   **NO VISITORS ARE ALLOWED IN THE SHORT STAY AREA OR RECOVERY ROOM!!**  IF YOU WILL BE ADMITTED INTO THE HOSPITAL YOU ARE ALLOWED ONLY TWO SUPPORT PEOPLE DURING VISITATION HOURS ONLY (10AM -8PM)   . The support person(s) may change daily. . The support person(s) must pass our screening, gel in and out, and wear a mask at all times, including in the patient's room. . Patients must also wear a mask when staff or their support person are in the room.  No visitors under the age of 4. Any visitor under the age of 11 must be accompanied by an adult.    COVID SWAB TESTING MUST BE COMPLETED ON:  Monday, 01-23-21 @ 13:30 PM   [address] [place], [place] [address]  (Must self quarantine after testing. Follow instructions on handout.)       Your procedure is scheduled on:  Wednesday, 01-25-21   Report to [facility] Main  Entrance    Report to admitting at 8:30 AM   Call this number if you have problems the morning of surgery [phone]   Do not eat food :After Midnight.   May have liquids until 8:00 AM day of surgery  CLEAR LIQUID DIET  Foods Allowed                                                                     Foods Excluded  Water, Black Coffee and tea, regular and decaf              liquids that you cannot  Plain Jell-O in any flavor  (No red)                                     see through such as: Fruit ices (not with fruit pulp)                                      milk, soups, orange juice              Iced Popsicles (No red)                                      All solid food                                   Apple juices Sports drinks like Gatorade (No red) Lightly seasoned clear broth or consume(fat free) Sugar, honey syrup     Oral Hygiene is also important to reduce your risk of infection.                                    Remember  - BRUSH YOUR TEETH THE MORNING OF SURGERY  WITH YOUR REGULAR TOOTHPASTE   Do NOT smoke after Midnight   Take these medicines the morning of surgery with A SIP OF WATER:  Amlodipine, Atenolol, Clonidine, Zetia, Hydralazine, Gabapentin,  Hydrocodone if  needed.  Okay to use Albuterol inhaler and bring with you day of surgery.  DO NOT TAKE ANY ORAL DIABETIC MEDICATIONS DAY OF YOUR SURGERY                               You may not have any metal on your body including hair pins, jewelry, and body piercings             Do not wear make-up, lotions, powders, perfumes/cologne, or deodorant             Do not wear nail polish.  Do not shave  48 hours prior to surgery.     Do not bring valuables to the hospital. [place].   Contacts, dentures or bridgework may not be worn into surgery.   Bring small overnight bag day of surgery.                Please read over the following fact sheets you were given: IF YOU HAVE QUESTIONS ABOUT YOUR PRE OP INSTRUCTIONS PLEASE CALL  [facility] - Preparing for Surgery Before surgery, you can play an important role.  Because skin is not sterile, your skin needs to be as free of germs as possible.  You can reduce the number of germs on your skin by washing with CHG (chlorahexidine gluconate) soap before surgery.  CHG is an antiseptic cleaner which kills germs and bonds with the skin to continue killing germs even after washing. Please DO NOT use if you have an allergy to CHG or antibacterial soaps.  If your skin becomes reddened/irritated stop using the CHG and inform your nurse when you arrive at Short Stay. Do not shave (including legs and underarms) for at least 48 hours prior to the first CHG shower.  You may shave your face/neck.  Please follow these instructions carefully:  1.  Shower with CHG Soap the night before surgery and the  morning of surgery.  2.  If you choose to wash your hair, wash your  hair first as usual with your normal  shampoo.  3.  After you shampoo, rinse your hair and body thoroughly to remove the shampoo.                             4.  Use CHG as you would any other liquid soap.  You can apply chg directly to the skin and wash.  Gently with a scrungie or clean washcloth.  5.  Apply the CHG Soap to your body ONLY FROM THE NECK DOWN.   Do   not use on face/ open                           Wound or open sores. Avoid contact with eyes, ears mouth and   genitals (private parts).                       Wash face,  Genitals (private parts) with your normal soap.             6.  Wash thoroughly, paying special attention to the area where your    surgery  will be performed.  7.  Thoroughly rinse your body with warm water from the neck down.  8.  DO NOT shower/wash with your normal soap after using and rinsing off the CHG Soap.                9.  Pat yourself dry with a clean towel.            10.  Wear clean pajamas.            11.  Place clean sheets on your bed the night of your first shower and do not  sleep with pets. Day of Surgery : Do not apply any lotions/deodorants the morning of surgery.  Please wear clean clothes to the hospital/surgery center.  FAILURE TO FOLLOW THESE INSTRUCTIONS MAY RESULT IN THE CANCELLATION OF YOUR SURGERY  PATIENT SIGNATURE_________________________________  NURSE SIGNATURE__________________________________  ________________________________________________________________________   [name]  An incentive spirometer is a tool that can help keep your lungs clear and active. This tool measures how well you are filling your lungs with each breath. Taking long deep breaths may help reverse or decrease the chance of developing breathing (pulmonary) problems (especially infection) following:  A long period of time when you are unable to move or be active. BEFORE THE PROCEDURE   If the spirometer includes an indicator to show your  best effort, your nurse or respiratory therapist will set it to a desired goal.  If possible, sit up straight or lean slightly forward. Try not to slouch.  Hold the incentive spirometer in an upright position. INSTRUCTIONS FOR USE  1. Sit on the edge of your bed if possible, or sit up as far as you can in bed or on a chair. 2. Hold the incentive spirometer in an upright position. 3. Breathe out normally. 4. Place the mouthpiece in your mouth and seal your lips tightly around it. 5. Breathe in slowly and as deeply as possible, raising the piston or the ball toward the top of the column. 6. Hold your breath for 3-5 seconds or for as long as possible. Allow the piston or ball to fall to the bottom of the column. 7. Remove the mouthpiece from your mouth and breathe out normally. 8. Rest for a few seconds and repeat Steps 1 through 7 at least 10 times every 1-2 hours when you are awake. Take your time and take a few normal breaths between deep breaths. 9. The spirometer may include an indicator to show your best effort. Use the indicator as a goal to work toward during each repetition. 10. After each set of 10 deep breaths, practice coughing to be sure your lungs are clear. If you have an incision (the cut made at the time of surgery), support your incision when coughing by placing a pillow or rolled up towels firmly against it. Once you are able to get out of bed, walk around indoors and cough well. You may stop using the incentive spirometer when instructed by your caregiver.  RISKS AND COMPLICATIONS  Take your time so you do not get dizzy or light-headed.  If you are in pain, you may need to take or ask for pain medication before doing incentive spirometry. It is harder to take a deep breath if you are having pain. AFTER USE  Rest and breathe slowly and easily.  It can be helpful to keep track of a log of  your progress. Your caregiver can provide you with a simple table to help with this. If  you are using the spirometer at home, follow these instructions: [place] IF:   You are having difficultly using the spirometer.  You have trouble using the spirometer as often as instructed.  Your pain medication is not giving enough relief while using the spirometer.  You develop fever of 100.5 F (38.1 C) or higher. SEEK IMMEDIATE MEDICAL CARE IF:   You cough up bloody sputum that had not been present before.  You develop fever of 102 F (38.9 C) or greater.  You develop worsening pain at or near the incision site. MAKE SURE YOU:   Understand these instructions.  Will watch your condition.  Will get help right away if you are not doing well or get worse. Document Released: 01/21/2007 Document Revised: 12/03/2011 Document Reviewed: 03/24/2007 Exit[employer] Patient Information 2014 Exit[employer], [place].   ________________________________________________________________________  WHAT IS A BLOOD TRANSFUSION? Blood Transfusion Information  A transfusion is the replacement of blood or some of its parts. Blood is made up of multiple cells which provide different functions.  Red blood cells carry oxygen and are used for blood loss replacement.  White blood cells fight against infection.  Platelets control bleeding.  Plasma helps clot blood.  Other blood products are available for specialized needs, such as hemophilia or other clotting disorders. BEFORE THE TRANSFUSION  Who gives blood for transfusions?   Healthy volunteers who are fully evaluated to make sure their blood is safe. This is blood bank blood. Transfusion therapy is the safest it has ever been in the practice of medicine. Before blood is taken from a donor, a complete history is taken to make sure that person has no history of diseases nor engages in risky social behavior (examples are intravenous drug use or sexual activity with multiple partners). The donor's travel history is screened to minimize risk of  transmitting infections, such as malaria. The donated blood is tested for signs of infectious diseases, such as HIV and hepatitis. The blood is then tested to be sure it is compatible with you in order to minimize the chance of a transfusion reaction. If you or a relative donates blood, this is often done in anticipation of surgery and is not appropriate for emergency situations. It takes many days to process the donated blood. RISKS AND COMPLICATIONS Although transfusion therapy is very safe and saves many lives, the main dangers of transfusion include:   Getting an infectious disease.  Developing a transfusion reaction. This is an allergic reaction to something in the blood you were given. Every precaution is taken to prevent this. The decision to have a blood transfusion has been considered carefully by your caregiver before blood is given. Blood is not given unless the benefits outweigh the risks. AFTER THE TRANSFUSION  Right after receiving a blood transfusion, you will usually feel much better and more energetic. This is especially true if your red blood cells have gotten low (anemic). The transfusion raises the level of the red blood cells which carry oxygen, and this usually causes an energy increase.  The nurse administering the transfusion will monitor you carefully for complications. HOME CARE INSTRUCTIONS  No special instructions are needed after a transfusion. You may find your energy is better. Speak with your caregiver about any limitations on activity for underlying diseases you may have. SEEK MEDICAL CARE IF:   Your condition is not improving after your transfusion.  You develop redness  or irritation at the intravenous (IV) site. SEEK IMMEDIATE MEDICAL CARE IF:  Any of the following symptoms occur over the next 12 hours:  Shaking chills.  You have a temperature by mouth above 102 F (38.9 C), not controlled by medicine.  Chest, back, or muscle pain.  People around you  feel you are not acting correctly or are confused.  Shortness of breath or difficulty breathing.  Dizziness and fainting.  You get a rash or develop hives.  You have a decrease in urine output.  Your urine turns a dark color or changes to pink, red, or brown. Any of the following symptoms occur over the next 10 days:  You have a temperature by mouth above 102 F (38.9 C), not controlled by medicine.  Shortness of breath.  Weakness after normal activity.  The white part of the eye turns yellow (jaundice).  You have a decrease in the amount of urine or are urinating less often.  Your urine turns a dark color or changes to pink, red, or brown. Document Released: 09/07/2000 Document Revised: 12/03/2011 Document Reviewed: 04/26/2008 [facility] Patient Information 2014 [place], [place].  _______________________________________________________________________

## 2021-01-05 NOTE — Progress Notes (Addendum)
COVID Vaccine Completed: x3 Date COVID Vaccine completed:  03-16-20 04-06-20 10-13-20 Has received booster: COVID vaccine manufacturer: Pfizer     Date of COVID positive in last 90 days:  N/A  PCP - Ledora Bottcher, MD Cardiologist - Betti Cruz, MD (notes Care Everywhere)  Chest x-ray -01-17-21 Epic EKG - 01-17-21 Epic Stress Test - 05-06-20 Care Everywhere ECHO - 04-13-19 Care Everywhere Cardiac Cath - N/A Pacemaker/ICD device last checked: Spinal Cord Stimulator: Holter Monitor - 04-16-19 Care Everywhere  Sleep Study - N/A CPAP -   Fasting Blood Sugar - 120 range Checks Blood Sugar - randomly checks  Blood Thinner Instructions: Aspirin Instructions:  ASA 81 mg  Last Dose: One week ago  Activity level:  Unable to go up a flight of stairs without symptoms of knee pain.  Patient lives alone and is able to perform ADLs without chest pain or shortness of breath.  Anesthesia review: Followed by cardiology for RBBB, PACs, HTN.  Hx of DM, CKD  PAT labs - Potassium 6.0, BUN 54, creatinine 1.66  Patient denies shortness of breath, fever, cough and chest pain at PAT appointment   Patient verbalized understanding of instructions that were given to them at the PAT appointment. Patient was also instructed that they will need to review over the PAT instructions again at home before surgery.

## 2021-01-17 ENCOUNTER — Ambulatory Visit (HOSPITAL_COMMUNITY)
Admission: RE | Admit: 2021-01-17 | Discharge: 2021-01-17 | Disposition: A | Discharge status: Home / Self Care | Payer: Medicare Other | Source: Ambulatory Visit | Attending: Orthopedic Surgery | Admitting: Orthopedic Surgery

## 2021-01-17 ENCOUNTER — Encounter (HOSPITAL_COMMUNITY)
Admission: RE | Admit: 2021-01-17 | Discharge: 2021-01-17 | Disposition: A | Discharge status: Home / Self Care | Payer: Medicare Other | Source: Ambulatory Visit | Attending: Orthopedic Surgery | Admitting: Orthopedic Surgery

## 2021-01-17 ENCOUNTER — Encounter (HOSPITAL_COMMUNITY): Payer: Self-pay

## 2021-01-17 ENCOUNTER — Other Ambulatory Visit: Payer: Self-pay

## 2021-01-17 DIAGNOSIS — Z01818 Encounter for other preprocedural examination: Secondary | ICD-10-CM

## 2021-01-17 DIAGNOSIS — I700 Atherosclerosis of aorta: Secondary | ICD-10-CM | POA: Diagnosis not present

## 2021-01-17 HISTORY — DX: Insomnia, unspecified: G47.00

## 2021-01-17 HISTORY — DX: Transient cerebral ischemic attack, unspecified: G45.9

## 2021-01-17 HISTORY — DX: Other chronic pain: G89.29

## 2021-01-17 LAB — CBC WITH DIFFERENTIAL/PLATELET
Abs Immature Granulocytes: 0.02 10*3/uL (ref 0.00–0.07)
Basophils Absolute: 0 10*3/uL (ref 0.0–0.1)
Basophils Relative: 0 %
Eosinophils Absolute: 0.2 10*3/uL (ref 0.0–0.5)
Eosinophils Relative: 3 %
HCT: 37 % (ref 36.0–46.0)
Hemoglobin: 11.4 g/dL — ABNORMAL LOW (ref 12.0–15.0)
Immature Granulocytes: 0 %
Lymphocytes Relative: 26 %
Lymphs Abs: 1.4 10*3/uL (ref 0.7–4.0)
MCH: 29.6 pg (ref 26.0–34.0)
MCHC: 30.8 g/dL (ref 30.0–36.0)
MCV: 96.1 fL (ref 80.0–100.0)
Monocytes Absolute: 0.4 10*3/uL (ref 0.1–1.0)
Monocytes Relative: 8 %
Neutro Abs: 3.3 10*3/uL (ref 1.7–7.7)
Neutrophils Relative %: 63 %
Platelets: 200 10*3/uL (ref 150–400)
RBC: 3.85 MIL/uL — ABNORMAL LOW (ref 3.87–5.11)
RDW: 13.1 % (ref 11.5–15.5)
WBC: 5.3 10*3/uL (ref 4.0–10.5)
nRBC: 0 % (ref 0.0–0.2)

## 2021-01-17 LAB — BASIC METABOLIC PANEL
Anion gap: 9 (ref 5–15)
BUN: 54 mg/dL — ABNORMAL HIGH (ref 8–23)
CO2: 25 mmol/L (ref 22–32)
Calcium: 9.4 mg/dL (ref 8.9–10.3)
Chloride: 111 mmol/L (ref 98–111)
Creatinine, Ser: 1.66 mg/dL — ABNORMAL HIGH (ref 0.44–1.00)
GFR, Estimated: 30 mL/min — ABNORMAL LOW (ref >60)
Glucose, Bld: 109 mg/dL — ABNORMAL HIGH (ref 70–99)
Potassium: 6 mmol/L — ABNORMAL HIGH (ref 3.5–5.1)
Sodium: 145 mmol/L (ref 135–145)

## 2021-01-17 LAB — APTT: aPTT: 32 seconds (ref 24–36)

## 2021-01-17 LAB — URINALYSIS, ROUTINE W REFLEX MICROSCOPIC
Bacteria, UA: NONE SEEN
Bilirubin Urine: NEGATIVE
Glucose, UA: NEGATIVE mg/dL
Hgb urine dipstick: NEGATIVE
Ketones, ur: NEGATIVE mg/dL
Nitrite: NEGATIVE
Protein, ur: NEGATIVE mg/dL
Specific Gravity, Urine: 1.01 (ref 1.005–1.030)
pH: 5 (ref 5.0–8.0)

## 2021-01-17 LAB — SURGICAL PCR SCREEN
MRSA, PCR: NEGATIVE
Staphylococcus aureus: NEGATIVE

## 2021-01-17 LAB — PROTIME-INR
INR: 0.9 (ref 0.8–1.2)
Prothrombin Time: 12.6 seconds (ref 11.4–15.2)

## 2021-01-17 LAB — GLUCOSE, CAPILLARY: Glucose-Capillary: 114 mg/dL — ABNORMAL HIGH (ref 70–99)

## 2021-01-17 LAB — HEMOGLOBIN A1C
Hgb A1c MFr Bld: 6 % — ABNORMAL HIGH (ref 4.8–5.6)
Mean Plasma Glucose: 125.5 mg/dL

## 2021-01-18 ENCOUNTER — Encounter (HOSPITAL_COMMUNITY): Payer: Self-pay | Admitting: Physician Assistant

## 2021-01-18 DIAGNOSIS — M1712 Unilateral primary osteoarthritis, left knee: Secondary | ICD-10-CM | POA: Diagnosis present

## 2021-01-18 NOTE — H&P (Signed)
TOTAL KNEE ADMISSION H&P  Patient is being admitted for left total knee arthroplasty.  Subjective:  Chief Complaint:left knee pain.  HPI: [name], 87 y.o. female, has a history of pain and functional disability in the left knee due to arthritis and has failed non-surgical conservative treatments for greater than 12 weeks to includeNSAID's and/or analgesics, corticosteriod injections, use of assistive devices, weight reduction as appropriate and activity modification.  Onset of symptoms was gradual, starting 3 years ago with gradually worsening course since that time. The patient noted no past surgery on the left knee(s).  Patient currently rates pain in the left knee(s) at 10 out of 10 with activity. Patient has night pain, worsening of pain with activity and weight bearing, pain that interferes with activities of daily living, pain with passive range of motion, crepitus and joint swelling.  Patient has evidence of periarticular osteophytes, joint subluxation and joint space narrowing by imaging studies.   There is no active infection.  Patient Active Problem List   Diagnosis Date Noted  . Degenerative arthritis of left knee 01/18/2021  . 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 Primary osteoarthritis of right hip 01/20/2018  . Osteoarthritis of right knee 01/18/2018  . Chronic pain syndrome   . Anxiety   . Right hip pain 11/29/2017  . CKD (chronic kidney disease) stage 2, GFR 60-89 ml/min 11/29/2017  . Prosthetic joint infection ([place])   . Renal failure 05/27/2012  . HTN (hypertension) 10/17/2011  . Infected prosthetic knee joint ([place]) 09/10/2011   Past Medical History:  Diagnosis Date  . Anxiety   . Arthritis   . Asthma   . Cancer ([place])    SKIN CA OF FACE  . Chronic kidney disease   . Chronic pain   . Depression   . Diabetes mellitus without complication ([place])   . Gallstones   . GERD (gastroesophageal reflux disease)   . Hypertension   . Insomnia    . Pneumonia   . Prosthetic joint infection ([place])   . TIA (transient ischemic attack)     Past Surgical History:  Procedure Laterality Date  .  ARM FRACTURE     LEFT  and right  . CHOLECYSTECTOMY    . DILATION AND CURETTAGE OF UTERUS    . IRRIGATION AND DEBRIDEMENT KNEE  09/12/2011   Procedure: IRRIGATION AND DEBRIDEMENT KNEE;  Surgeon: Kerin Salen;  Location: [facility];  Service: Orthopedics;  Laterality: Right;  I&D RIGHT TKA REVISE BEARINGS/MBT. Poly exchange  . REPLACEMENT TOTAL KNEE     right  . TOTAL HIP ARTHROPLASTY    . TOTAL HIP ARTHROPLASTY Right 01/20/2018   Procedure: TOTAL HIP ARTHROPLASTY WITH REMOVAL OF INTRAMEDULLARY NAIL;  Surgeon: Frederik Pear, MD;  Location: [facility];  Service: Orthopedics;  Laterality: Right;  . TOTAL SHOULDER ARTHROPLASTY Right 10/02/2018   Procedure: REVERSE TOTAL SHOULDER;  Surgeon: Tania Ade, MD;  Location: [facility];  Service: Orthopedics;  Laterality: Right;    No current facility-administered medications for this encounter.   Current Outpatient Medications  Medication Sig Dispense Refill Last Dose  . albuterol (PROVENTIL HFA;VENTOLIN HFA) 108 (90 BASE) MCG/ACT inhaler Inhale 2 puffs into the lungs daily as needed for shortness of breath or wheezing. While awake     . ALPRAZolam (XANAX) 0.5 MG tablet Take 0.5 mg by mouth at bedtime.     Marland Kitchen amLODipine (NORVASC) 10 MG tablet Take 10 mg by mouth daily.     Marland Kitchen aspirin EC 81 MG  tablet Take 81 mg by mouth daily.     Marland Kitchen atenolol (TENORMIN) 50 MG tablet Take 1.5 tablets (75 mg total) by mouth daily. (Patient taking differently: Take 50 mg by mouth daily.) 60 tablet 0   . B Complex-C (SUPER B COMPLEX PO) Take 1 tablet by mouth daily.     . Biotin w/ Vitamins C & E (HAIR/SKIN/NAILS PO) Take 1 tablet by mouth daily.     Marland Kitchen BLACK ELDERBERRY PO Take 1.7 g by mouth daily.     . CELEBREX 200 MG capsule Take 200 mg by mouth 2 (two) times daily.      . cholecalciferol (VITAMIN D) 25 MCG (1000 UNIT) tablet Take 1,000  Units by mouth daily.     . cloNIDine (CATAPRES) 0.2 MG tablet Take 1 tablet (0.2 mg total) by mouth 2 (two) times daily. (Patient taking differently: Take 0.2 mg by mouth 3 (three) times daily.) 60 tablet 1   . Cyanocobalamin (VITAMIN B-12) 5000 MCG SUBL Place 5,000 mcg under the tongue daily.     Marland Kitchen ezetimibe (ZETIA) 10 MG tablet Take 10 mg by mouth daily.     . fluticasone (FLOVENT HFA) 110 MCG/ACT inhaler Inhale 1 puff into the lungs daily as needed (asthma).     . furosemide (LASIX) 40 MG tablet Take 1 tablet (40 mg total) by mouth daily.     Marland Kitchen gabapentin (NEURONTIN) 300 MG capsule Take 1 capsule (300 mg total) by mouth 3 (three) times daily. (Patient taking differently: Take 600 mg by mouth 2 (two) times daily.) 90 capsule 1   . hydrALAZINE (APRESOLINE) 25 MG tablet Take 12.5 mg by mouth 4 (four) times daily.     Marland Kitchen HYDROcodone-acetaminophen (NORCO/VICODIN) 5-325 MG tablet Take 1-2 tablets by mouth every 4 (four) hours as needed for moderate pain (pain score 4-6). 30 tablet 0   . melatonin 5 MG TABS Take 5 mg by mouth at bedtime as needed (Sleep).     . Multiple Vitamin (MULTIVITAMIN) capsule Take 1 capsule by mouth daily. Centrum silver     . pantoprazole (PROTONIX) 40 MG tablet Take 40 mg by mouth daily.     . vitamin C (ASCORBIC ACID) 500 MG tablet Take 500 mg by mouth daily.     Marland Kitchen zolpidem (AMBIEN) 5 MG tablet Take 5 mg by mouth at bedtime as needed for sleep.      . naloxone (NARCAN) nasal spray 4 mg/0.1 mL       Allergies  Allergen Reactions  . Lisinopril Swelling    angioedema  . Hydrochlorothiazide Other (See Comments)    UNSPECIFIED REACTION   . Lipitor [Atorvastatin Calcium] Other (See Comments)    UNSPECIFIED REACTION   . Thiazide-Type Diuretics Other (See Comments)    UNSPECIFIED REACTION   . Zocor [Simvastatin] Swelling    SWELLING REACTION UNSPECIFIED   . Codeine Nausea Only    Social History   Tobacco Use  . Smoking status: Former Research scientist (life sciences)  . Smokeless tobacco:  Never Used  . Tobacco comment: Quit 50 years ago  Substance Use Topics  . Alcohol use: No    Family History  Problem Relation Age of Onset  . Diabetes Mellitus II Daughter      Review of Systems  Constitutional: Positive for diaphoresis and fatigue.  HENT: Positive for hearing loss and sinus pressure.   Respiratory: Positive for shortness of breath.   Cardiovascular: Positive for leg swelling.  Gastrointestinal: Positive for abdominal pain, constipation, nausea and vomiting.  Endocrine: Negative.   Genitourinary: Negative.   Musculoskeletal: Positive for arthralgias and myalgias.  Allergic/Immunologic: Negative.   Neurological: Negative.   Hematological: Negative.   Psychiatric/Behavioral: Positive for sleep disturbance. The patient is nervous/anxious.     Objective:  Physical Exam Constitutional:      Appearance: Normal appearance. She is normal weight.  HENT:     Head: Normocephalic and atraumatic.     Nose: Nose normal.  Eyes:     Pupils: Pupils are equal, round, and reactive to light.  Cardiovascular:     Pulses: Normal pulses.  Pulmonary:     Effort: Pulmonary effort is normal.  Musculoskeletal:        General: Swelling and tenderness present.     Cervical back: Normal range of motion and neck supple.     Comments: Her left knee has a 10 flexion contracture flexes to 100 very tender along the medial joint line collateral ligaments are stable, obvious varus deformity pain with varus stress  Skin:    General: Skin is warm and dry.  Neurological:     General: No focal deficit present.     Mental Status: She is alert and oriented to person, place, and time. Mental status is at baseline.  Psychiatric:        Mood and Affect: Mood normal.        Behavior: Behavior normal.        Thought Content: Thought content normal.        Judgment: Judgment normal.     Vital signs in last 24 hours: Temp:  [98.1 F (36.7 C)] 98.1 F (36.7 C) (04/26 1420) Pulse Rate:   [65] 65 (04/26 1420) Resp:  [18] 18 (04/26 1420) BP: (160)/(53) 160/53 (04/26 1420) SpO2:  [100 %] 100 % (04/26 1420) Weight:  [77.2 kg] 77.2 kg (04/26 1420)  Labs:   Estimated body mass index is 30.15 kg/m as calculated from the following:   Height as of 01/17/21: 5\' 3"  (1.6 m).   Weight as of 01/17/21: 77.2 kg.   Imaging Review Plain radiographs demonstrate  bilateral AP weightbearing, bilateral Rosenberg, lateral sunrise views of the left knee are taken and reviewed in office today.  This shows end-stage bone-on-bone arthritis of the left knee medial compartment with subchondral sclerosis and periarticular osteophyte formation.    Assessment/Plan:  End stage arthritis, left knee   The patient history, physical examination, clinical judgment of the provider and imaging studies are consistent with end stage degenerative joint disease of the left knee(s) and total knee arthroplasty is deemed medically necessary. The treatment options including medical management, injection therapy arthroscopy and arthroplasty were discussed at length. The risks and benefits of total knee arthroplasty were presented and reviewed. The risks due to aseptic loosening, infection, stiffness, patella tracking problems, thromboembolic complications and other imponderables were discussed. The patient acknowledged the explanation, agreed to proceed with the plan and consent was signed. Patient is being admitted for inpatient treatment for surgery, pain control, PT, OT, prophylactic antibiotics, VTE prophylaxis, progressive ambulation and ADL's and discharge planning. The patient is planning to be discharged home with home health services     Patient's anticipated LOS is less than 2 midnights, meeting these requirements: - Younger than 60 - Lives within 1 hour of care - Has a competent adult at home to recover with post-op recover - NO history of  - Chronic pain requiring opiods  - Diabetes  - Coronary Artery  Disease  - Heart failure  -  Heart attack  - Stroke  - DVT/VTE  - Cardiac arrhythmia  - Respiratory Failure/COPD  - Renal failure  - Anemia  - Advanced Liver disease

## 2021-01-18 NOTE — Progress Notes (Addendum)
BMP sent to Dr. Mayer Camel to review.

## 2021-01-23 ENCOUNTER — Other Ambulatory Visit (HOSPITAL_COMMUNITY)
Admission: RE | Admit: 2021-01-23 | Discharge: 2021-01-23 | Disposition: A | Discharge status: Home / Self Care | Payer: Medicare Other | Source: Ambulatory Visit | Attending: Orthopedic Surgery | Admitting: Orthopedic Surgery

## 2021-01-23 DIAGNOSIS — Z20822 Contact with and (suspected) exposure to covid-19: Secondary | ICD-10-CM | POA: Insufficient documentation

## 2021-01-23 DIAGNOSIS — Z01812 Encounter for preprocedural laboratory examination: Secondary | ICD-10-CM | POA: Insufficient documentation

## 2021-01-24 LAB — SARS CORONAVIRUS 2 (TAT 6-24 HRS): SARS Coronavirus 2: NEGATIVE

## 2021-01-24 MED ORDER — TRANEXAMIC ACID 1000 MG/10ML IV SOLN
2000.0000 mg | INTRAVENOUS | Status: AC
  Filled 2021-01-24: qty 20

## 2021-01-24 MED ORDER — BUPIVACAINE LIPOSOME 1.3 % IJ SUSP
20.0000 mL | Freq: Once | INTRAMUSCULAR | Status: DC
  Filled 2021-01-24: qty 20

## 2021-01-25 DIAGNOSIS — M1712 Unilateral primary osteoarthritis, left knee: Secondary | ICD-10-CM | POA: Diagnosis present

## 2021-01-25 LAB — TYPE AND SCREEN
ABO/RH(D): A POS
Antibody Screen: NEGATIVE

## 2021-01-26 ENCOUNTER — Other Ambulatory Visit: Payer: Self-pay | Admitting: Orthopedic Surgery

## 2021-01-26 DIAGNOSIS — Z01818 Encounter for other preprocedural examination: Secondary | ICD-10-CM

## 2021-01-31 NOTE — Progress Notes (Signed)
Per Marina Goodell, MD cardiology note 01/27/2021 patient needs to be seen by nephrologist prior to proceeding with knee surgery.  I spoke with Daughter in law Joseph Art she stated appointment with Nephrologist is not scheduled until 02/27/2021. I left a voicemail with Wells Guiles to make Dr. Shaune Spittle office aware.

## 2021-02-06 ENCOUNTER — Encounter (HOSPITAL_COMMUNITY): Admission: RE | Payer: Self-pay | Source: Home / Self Care

## 2021-02-06 ENCOUNTER — Ambulatory Visit (HOSPITAL_COMMUNITY): Admission: RE | Admit: 2021-02-06 | Payer: Medicare Other | Source: Home / Self Care | Admitting: Orthopedic Surgery

## 2021-02-06 DIAGNOSIS — M1712 Unilateral primary osteoarthritis, left knee: Secondary | ICD-10-CM | POA: Diagnosis present

## 2021-02-06 SURGERY — ARTHROPLASTY, KNEE, TOTAL
Anesthesia: Spinal | Site: Knee | Laterality: Left

## 2021-08-22 ENCOUNTER — Other Ambulatory Visit: Payer: Self-pay | Admitting: Orthopedic Surgery

## 2021-08-30 NOTE — Patient Instructions (Signed)
DUE TO COVID-19 ONLY ONE VISITOR IS ALLOWED TO COME WITH YOU AND STAY IN THE WAITING ROOM ONLY DURING PRE OP AND PROCEDURE.   **NO VISITORS ARE ALLOWED IN THE SHORT STAY AREA OR RECOVERY ROOM!!**  IF YOU WILL BE ADMITTED INTO THE HOSPITAL YOU ARE ALLOWED ONLY TWO SUPPORT PEOPLE DURING VISITATION HOURS ONLY (7 AM -8PM)   The support person(s) must pass our screening, gel in and out, and wear a mask at all times, including in the patient's room. Patients must also wear a mask when staff or their support person are in the room. Visitors GUEST BADGE MUST BE WORN VISIBLY  One adult visitor may remain with you overnight and MUST be in the room by 8 P.M.  No visitors under the age of 44. Any visitor under the age of 62 must be accompanied by an adult.    COVID SWAB TESTING MUST BE COMPLETED ON:  08/31/21            8 - 3 **MUST PRESENT COMPLETED FORM AT TESTING SITE**    [place] (backside of the building) You are not required to quarantine, however you are required to wear a well-fitted mask when you are out and around people not in your household.  Hand Hygiene often Do NOT share personal items Notify your provider if you are in close contact with someone who has COVID or you develop fever 100.4 or greater, new onset of sneezing, cough, sore throat, shortness of breath or body aches.  [place], [address], must go inside of the hospital, NOT A DRIVE THRU!  (Must self quarantine after testing. Follow instructions on handout.)       Your procedure is scheduled on: 09/04/21   Report to [facility] Main Entrance    Report to admitting at: 10:00 AM   Call this number if you have problems the morning of surgery [phone]   Do not eat food :After Midnight.   May have liquids until : 9:45 AM   day of surgery  CLEAR LIQUID DIET  Foods Allowed                                                                      Foods Excluded  Water, Black Coffee and tea, regular and decaf                             liquids that you cannot  Plain Jell-O in any flavor  (No red)                                           see through such as: Fruit ices (not with fruit pulp)                                     milk, soups, orange juice              Iced Popsicles (No red)  All solid food                                   Apple juices Sports drinks like Gatorade (No red) Lightly seasoned clear broth or consume(fat free) Sugar  Sample Menu Breakfast                                Lunch                                     Supper Cranberry juice                    Beef broth                            Chicken broth Jell-O                                     Grape juice                           Apple juice Coffee or tea                        Jell-O                                      Popsicle                                                Coffee or tea                        Coffee or tea      Complete one Gatorade drink the morning of surgery at: 9:45 AM       the day of surgery.     The day of surgery:  Drink ONE (1) Pre-Surgery Clear Ensure or G2 by am the morning of surgery. Drink in one sitting. Do not sip.  This drink was given to you during your hospital  pre-op appointment visit. Nothing else to drink after completing the  Pre-Surgery Clear Ensure or G2.          If you have questions, please contact your surgeon's office.     Oral Hygiene is also important to reduce your risk of infection.                                    Remember - BRUSH YOUR TEETH THE MORNING OF SURGERY WITH YOUR REGULAR TOOTHPASTE   Do NOT smoke after Midnight   Take these medicines the morning of surgery with A SIP OF WATER: gabapentin,clonidine,hydralazine,atenelol,amlodipine.Use inhalers as usual.  DO NOT TAKE ANY ORAL DIABETIC MEDICATIONS DAY OF YOUR SURGERY  You may not have any metal on your body including hair pins, jewelry, and body piercing             Do not wear make-up, lotions, powders, perfumes/cologne, or deodorant  Do not wear nail polish including gel and S&S, artificial/acrylic nails, or any other type of covering on natural nails including finger and toenails. If you have artificial nails, gel coating, etc. that needs to be removed by a nail salon please have this removed prior to surgery or surgery may need to be canceled/ delayed if the surgeon/ anesthesia feels like they are unable to be safely monitored.   Do not shave  48 hours prior to surgery.    Do not bring valuables to the hospital. [place].   Contacts, dentures or bridgework may not be worn into surgery.   Bring small overnight bag day of surgery.    Patients discharged on the day of surgery will not be allowed to drive home.   Special Instructions: Bring a copy of your healthcare power of attorney and living will documents         the day of surgery if you haven't scanned them before.              Please read over the following fact sheets you were given: IF YOU HAVE QUESTIONS ABOUT YOUR PRE-OP INSTRUCTIONS PLEASE CALL [phone]     [facility] - Preparing for Surgery Before surgery, you can play an important role.  Because skin is not sterile, your skin needs to be as free of germs as possible.  You can reduce the number of germs on your skin by washing with CHG (chlorahexidine gluconate) soap before surgery.  CHG is an antiseptic cleaner which kills germs and bonds with the skin to continue killing germs even after washing. Please DO NOT use if you have an allergy to CHG or antibacterial soaps.  If your skin becomes reddened/irritated stop using the CHG and inform your nurse when you arrive at Short Stay. Do not shave (including legs and underarms) for at least 48 hours prior to the first CHG  shower.  You may shave your face/neck. Please follow these instructions carefully:  1.  Shower with CHG Soap the night before surgery and the  morning of Surgery.  2.  If you choose to wash your hair, wash your hair first as usual with your  normal  shampoo.  3.  After you shampoo, rinse your hair and body thoroughly to remove the  shampoo.                           4.  Use CHG as you would any other liquid soap.  You can apply chg directly  to the skin and wash                       Gently with a scrungie or clean washcloth.  5.  Apply the CHG Soap to your body ONLY FROM THE NECK DOWN.   Do not use on face/ open                           Wound or open sores. Avoid contact with eyes, ears mouth and genitals (private parts).  Wash face,  Genitals (private parts) with your normal soap.             6.  Wash thoroughly, paying special attention to the area where your surgery  will be performed.  7.  Thoroughly rinse your body with warm water from the neck down.  8.  DO NOT shower/wash with your normal soap after using and rinsing off  the CHG Soap.                9.  Pat yourself dry with a clean towel.            10.  Wear clean pajamas.            11.  Place clean sheets on your bed the night of your first shower and do not  sleep with pets. Day of Surgery : Do not apply any lotions/deodorants the morning of surgery.  Please wear clean clothes to the hospital/surgery center.  FAILURE TO FOLLOW THESE INSTRUCTIONS MAY RESULT IN THE CANCELLATION OF YOUR SURGERY PATIENT SIGNATURE_________________________________  NURSE SIGNATURE__________________________________  ________________________________________________________________________   [name]  An incentive spirometer is a tool that can help keep your lungs clear and active. This tool measures how well you are filling your lungs with each breath. Taking long deep breaths may help reverse or decrease the chance  of developing breathing (pulmonary) problems (especially infection) following: A long period of time when you are unable to move or be active. BEFORE THE PROCEDURE  If the spirometer includes an indicator to show your best effort, your nurse or respiratory therapist will set it to a desired goal. If possible, sit up straight or lean slightly forward. Try not to slouch. Hold the incentive spirometer in an upright position. INSTRUCTIONS FOR USE  Sit on the edge of your bed if possible, or sit up as far as you can in bed or on a chair. Hold the incentive spirometer in an upright position. Breathe out normally. Place the mouthpiece in your mouth and seal your lips tightly around it. Breathe in slowly and as deeply as possible, raising the piston or the ball toward the top of the column. Hold your breath for 3-5 seconds or for as long as possible. Allow the piston or ball to fall to the bottom of the column. Remove the mouthpiece from your mouth and breathe out normally. Rest for a few seconds and repeat Steps 1 through 7 at least 10 times every 1-2 hours when you are awake. Take your time and take a few normal breaths between deep breaths. The spirometer may include an indicator to show your best effort. Use the indicator as a goal to work toward during each repetition. After each set of 10 deep breaths, practice coughing to be sure your lungs are clear. If you have an incision (the cut made at the time of surgery), support your incision when coughing by placing a pillow or rolled up towels firmly against it. Once you are able to get out of bed, walk around indoors and cough well. You may stop using the incentive spirometer when instructed by your caregiver.  RISKS AND COMPLICATIONS Take your time so you do not get dizzy or light-headed. If you are in pain, you may need to take or ask for pain medication before doing incentive spirometry. It is harder to take a deep breath if you are having  pain. AFTER USE Rest and breathe slowly and easily. It can be helpful to keep track  of a log of your progress. Your caregiver can provide you with a simple table to help with this. If you are using the spirometer at home, follow these instructions: [place] IF:  You are having difficultly using the spirometer. You have trouble using the spirometer as often as instructed. Your pain medication is not giving enough relief while using the spirometer. You develop fever of 100.5 F (38.1 C) or higher. SEEK IMMEDIATE MEDICAL CARE IF:  You cough up bloody sputum that had not been present before. You develop fever of 102 F (38.9 C) or greater. You develop worsening pain at or near the incision site. MAKE SURE YOU:  Understand these instructions. Will watch your condition. Will get help right away if you are not doing well or get worse. Document Released: 01/21/2007 Document Revised: 12/03/2011 Document Reviewed: 03/24/2007 [facility] Patient Information 2014 [place], [place].   ________________________________________________________________________

## 2021-08-31 ENCOUNTER — Encounter (HOSPITAL_COMMUNITY)
Admission: RE | Admit: 2021-08-31 | Discharge: 2021-08-31 | Disposition: A | Discharge status: Home / Self Care | Payer: Medicare Other | Source: Ambulatory Visit | Attending: Family Medicine | Admitting: Family Medicine

## 2021-08-31 DIAGNOSIS — E119 Type 2 diabetes mellitus without complications: Secondary | ICD-10-CM

## 2021-08-31 NOTE — Progress Notes (Signed)
As per Renee,Pt.'s daughter in law surgery has been cancelled until further notice.

## 2021-09-04 ENCOUNTER — Ambulatory Visit: Admit: 2021-09-04 | Payer: Medicare Other | Admitting: Orthopedic Surgery

## 2021-09-04 SURGERY — ARTHROPLASTY, KNEE, TOTAL
Anesthesia: Spinal | Site: Knee | Laterality: Left

## 2022-03-14 ENCOUNTER — Other Ambulatory Visit: Payer: Self-pay | Admitting: Orthopedic Surgery

## 2022-03-15 ENCOUNTER — Other Ambulatory Visit: Payer: Self-pay | Admitting: Orthopedic Surgery

## 2022-03-28 NOTE — Patient Instructions (Signed)
DUE TO COVID-19 ONLY TWO VISITORS  (aged 86 and older)  ARE ALLOWED TO COME WITH YOU AND STAY IN THE WAITING ROOM ONLY DURING PRE OP AND PROCEDURE.   **NO VISITORS ARE ALLOWED IN THE SHORT STAY AREA OR RECOVERY ROOM!!**  IF YOU WILL BE ADMITTED INTO THE HOSPITAL YOU ARE ALLOWED ONLY FOUR SUPPORT PEOPLE DURING VISITATION HOURS ONLY (7 AM -8PM)   The support person(s) must pass our screening, gel in and out, and wear a mask at all times, including in the patient's room. Patients must also wear a mask when staff or their support person are in the room. Visitors GUEST BADGE MUST BE WORN VISIBLY  One adult visitor may remain with you overnight and MUST be in the room by 8 P.M.     Your procedure is scheduled on: 04/09/22   Report to [facility] Main Entrance    Report to admitting at 6:35 AM   Call this number if you have problems the morning of surgery [phone]   Do not eat food :After Midnight.   After Midnight you may have the following liquids until _6:00_am DAY OF SURGERY  Water Black Coffee (sugar ok, NO MILK/CREAM OR CREAMERS)  Tea (sugar ok, NO MILK/CREAM OR CREAMERS) regular and decaf                             Plain Jell-O (NO RED)                                           Fruit ices (not with fruit pulp, NO RED)                                     Popsicles (NO RED)                                                                  Juice: apple, WHITE grape, WHITE cranberry Sports drinks like Gatorade (NO RED) Clear broth(vegetable,chicken,beef)                    The day of surgery:  Drink ONE G2 at  5:45 AM the morning of surgery. Drink in one sitting. Do not sip.  This drink was given to you during your hospital  pre-op appointment visit. Nothing else to drink after completing the  G2.at 6:00 AM          If you have questions, please contact your surgeon's office.      Oral Hygiene is also important to reduce your risk of infection.                                     Remember - BRUSH YOUR TEETH THE MORNING OF SURGERY WITH YOUR REGULAR TOOTHPASTE    Take these medicines the morning of surgery with A SIP OF WATER: Buspirone, Gabapentin, Clonidine, Hydralazine, Atenolol, Carvedilol, Pantoprazole.  Use your inhaler and bring it with you  Bring CPAP mask and tubing day of surgery.                              You may not have any metal on your body including hair pins, jewelry, and body piercing             Do not wear make-up, lotions, powders, perfumes/cologne, or deodorant  Do not wear nail polish including gel and S&S, artificial/acrylic nails, or any other type of covering on natural nails including finger and toenails. If you have artificial nails, gel coating, etc. that needs to be removed by a nail salon please have this removed prior to surgery or surgery may need to be canceled/ delayed if the surgeon/ anesthesia feels like they are unable to be safely monitored.   Do not shave  48 hours prior to surgery.             Do not bring valuables to the hospital. [place].   Contacts, dentures or bridgework may not be worn into surgery.   Bring small overnight bag day of surgery.   DO NOT [place]. PHARMACY WILL DISPENSE MEDICATIONS LISTED ON YOUR MEDICATION LIST TO YOU DURING YOUR ADMISSION [place]!       Special Instructions: Bring a copy of your healthcare power of attorney and living will documents   the day of surgery if you haven't scanned them before.              Please read over the following fact sheets you were given: IF YOU HAVE QUESTIONS ABOUT YOUR PRE-OP INSTRUCTIONS PLEASE CALL [phone]     [facility] - Preparing for Surgery Before surgery, you can play an important role.  Because skin is not sterile, your skin needs to be as free of germs as possible.  You can reduce the number of germs on your skin by washing with  CHG (chlorahexidine gluconate) soap before surgery.  CHG is an antiseptic cleaner which kills germs and bonds with the skin to continue killing germs even after washing. Please DO NOT use if you have an allergy to CHG or antibacterial soaps.  If your skin becomes reddened/irritated stop using the CHG and inform your nurse when you arrive at Short Stay. Do not shave (including legs and underarms) for at least 48 hours prior to the first CHG shower.  Please follow these instructions carefully:  1.  Shower with CHG Soap the night before surgery and the  morning of Surgery.  2.  If you choose to wash your hair, wash your hair first as usual with your  normal  shampoo.  3.  After you shampoo, rinse your hair and body thoroughly to remove the  shampoo.                            4.  Use CHG as you would any other liquid soap.  You can apply chg directly  to the skin and wash                       Gently with a scrungie or clean washcloth.  5.  Apply the CHG Soap to your body ONLY FROM THE NECK DOWN.   Do not  use on face/ open                           Wound or open sores. Avoid contact with eyes, ears mouth and genitals (private parts).                       Wash face,  Genitals (private parts) with your normal soap.             6.  Wash thoroughly, paying special attention to the area where your surgery  will be performed.  7.  Thoroughly rinse your body with warm water from the neck down.  8.  DO NOT shower/wash with your normal soap after using and rinsing off  the CHG Soap.                9.  Pat yourself dry with a clean towel.            10.  Wear clean pajamas.            11.  Place clean sheets on your bed the night of your first shower and do not  sleep with pets. Day of Surgery : Do not apply any lotions/deodorants the morning of surgery.  Please wear clean clothes to the hospital/surgery center.  FAILURE TO FOLLOW THESE INSTRUCTIONS MAY RESULT IN THE CANCELLATION OF YOUR  SURGERY    ________________________________________________________________________   Incentive Spirometer  An incentive spirometer is a tool that can help keep your lungs clear and active. This tool measures how well you are filling your lungs with each breath. Taking long deep breaths may help reverse or decrease the chance of developing breathing (pulmonary) problems (especially infection) following: A long period of time when you are unable to move or be active. BEFORE THE PROCEDURE  If the spirometer includes an indicator to show your best effort, your nurse or respiratory therapist will set it to a desired goal. If possible, sit up straight or lean slightly forward. Try not to slouch. Hold the incentive spirometer in an upright position. INSTRUCTIONS FOR USE  Sit on the edge of your bed if possible, or sit up as far as you can in bed or on a chair. Hold the incentive spirometer in an upright position. Breathe out normally. Place the mouthpiece in your mouth and seal your lips tightly around it. Breathe in slowly and as deeply as possible, raising the piston or the ball toward the top of the column. Hold your breath for 3-5 seconds or for as long as possible. Allow the piston or ball to fall to the bottom of the column. Remove the mouthpiece from your mouth and breathe out normally. Rest for a few seconds and repeat Steps 1 through 7 at least 10 times every 1-2 hours when you are awake. Take your time and take a few normal breaths between deep breaths. The spirometer may include an indicator to show your best effort. Use the indicator as a goal to work toward during each repetition. After each set of 10 deep breaths, practice coughing to be sure your lungs are clear. If you have an incision (the cut made at the time of surgery), support your incision when coughing by placing a pillow or rolled up towels firmly against it. Once you are able to get out of bed, walk around indoors and  cough well. You may stop using the incentive spirometer when instructed by your  caregiver.  RISKS AND COMPLICATIONS Take your time so you do not get dizzy or light-headed. If you are in pain, you may need to take or ask for pain medication before doing incentive spirometry. It is harder to take a deep breath if you are having pain. AFTER USE Rest and breathe slowly and easily. It can be helpful to keep track of a log of your progress. Your caregiver can provide you with a simple table to help with this. If you are using the spirometer at home, follow these instructions: [place] IF:  You are having difficultly using the spirometer. You have trouble using the spirometer as often as instructed. Your pain medication is not giving enough relief while using the spirometer. You develop fever of 100.5 F (38.1 C) or higher. SEEK IMMEDIATE MEDICAL CARE IF:  You cough up bloody sputum that had not been present before. You develop fever of 102 F (38.9 C) or greater. You develop worsening pain at or near the incision site. MAKE SURE YOU:  Understand these instructions. Will watch your condition. Will get help right away if you are not doing well or get worse. Document Released: 01/21/2007 Document Revised: 12/03/2011 Document Reviewed: 03/24/2007 [facility] Patient Information 2014 [place], [place].   ________________________________________________________________________

## 2022-03-29 ENCOUNTER — Ambulatory Visit (HOSPITAL_COMMUNITY)
Admission: RE | Admit: 2022-03-29 | Discharge: 2022-03-29 | Disposition: A | Discharge status: Home / Self Care | Payer: Medicare Other | Source: Ambulatory Visit | Attending: Orthopedic Surgery | Admitting: Orthopedic Surgery

## 2022-03-29 ENCOUNTER — Encounter (HOSPITAL_COMMUNITY)
Admission: RE | Admit: 2022-03-29 | Discharge: 2022-03-29 | Disposition: A | Discharge status: Home / Self Care | Payer: Medicare Other | Source: Ambulatory Visit | Attending: Orthopedic Surgery | Admitting: Orthopedic Surgery

## 2022-03-29 ENCOUNTER — Ambulatory Visit (HOSPITAL_COMMUNITY): Payer: Medicare Other | Admitting: Anesthesiology

## 2022-03-29 ENCOUNTER — Other Ambulatory Visit: Payer: Self-pay

## 2022-03-29 ENCOUNTER — Encounter (HOSPITAL_COMMUNITY): Payer: Self-pay

## 2022-03-29 ENCOUNTER — Ambulatory Visit (HOSPITAL_COMMUNITY): Payer: Medicare Other | Admitting: Physician Assistant

## 2022-03-29 DIAGNOSIS — Z01818 Encounter for other preprocedural examination: Secondary | ICD-10-CM | POA: Insufficient documentation

## 2022-03-29 DIAGNOSIS — E119 Type 2 diabetes mellitus without complications: Secondary | ICD-10-CM | POA: Insufficient documentation

## 2022-03-29 DIAGNOSIS — I4510 Unspecified right bundle-branch block: Secondary | ICD-10-CM

## 2022-03-29 HISTORY — DX: Unspecified right bundle-branch block: I45.10

## 2022-03-29 LAB — BASIC METABOLIC PANEL
Anion gap: 7 (ref 5–15)
BUN: 30 mg/dL — ABNORMAL HIGH (ref 8–23)
CO2: 22 mmol/L (ref 22–32)
Calcium: 9.5 mg/dL (ref 8.9–10.3)
Chloride: 113 mmol/L — ABNORMAL HIGH (ref 98–111)
Creatinine, Ser: 1.09 mg/dL — ABNORMAL HIGH (ref 0.44–1.00)
GFR, Estimated: 49 mL/min — ABNORMAL LOW (ref >60)
Glucose, Bld: 107 mg/dL — ABNORMAL HIGH (ref 70–99)
Potassium: 5.1 mmol/L (ref 3.5–5.1)
Sodium: 142 mmol/L (ref 135–145)

## 2022-03-29 LAB — CBC
HCT: 39 % (ref 36.0–46.0)
Hemoglobin: 12.4 g/dL (ref 12.0–15.0)
MCH: 30.5 pg (ref 26.0–34.0)
MCHC: 31.8 g/dL (ref 30.0–36.0)
MCV: 96.1 fL (ref 80.0–100.0)
Platelets: 256 10*3/uL (ref 150–400)
RBC: 4.06 MIL/uL (ref 3.87–5.11)
RDW: 13.9 % (ref 11.5–15.5)
WBC: 6.4 10*3/uL (ref 4.0–10.5)
nRBC: 0 % (ref 0.0–0.2)

## 2022-03-29 LAB — SURGICAL PCR SCREEN
MRSA, PCR: NEGATIVE
Staphylococcus aureus: NEGATIVE

## 2022-03-29 LAB — TYPE AND SCREEN
ABO/RH(D): A POS
Antibody Screen: NEGATIVE

## 2022-03-29 NOTE — Progress Notes (Addendum)
Anesthesia note:  Bowel prep reminder:NA  PCP - Dr. Raliegh Ip. [place] Cardiologist -Dr. Julien Nordmann Other-   Chest x-ray - no EKG - 03/29/22-chart Stress Test - no ECHO - 02/21/22-epic Cardiac Cath - no  Pacemaker/ICD device last checked:NA  Sleep Study - no CPAP -   Pt is pre diabetic-Pt and daughter -in-law deny ever hearing that Fasting Blood Sugar -  Checks Blood Sugar _____  Blood Thinner:NA Blood Thinner Instructions: Aspirin Instructions: Last Dose:  Anesthesia review: yes  Patient denies shortness of breath, fever, cough and chest pain at PAT appointment Pt was nevous and easy to get tearful talking about her children. Her BP was elevated at the PAT visit 201/94 and went up after the visit. The daughter -in-law will monitor it at home and call the Dr. If it is still up. She takes a xanax because of white coat syndrome and she forgot to take it.  EKG at PAT was NSR with RBBB.  Patient verbalized understanding of instructions that were given to them at the PAT appointment. Patient was also instructed that they will need to review over the PAT instructions again at home before surgery. Yes. Daughter -in-law will help her get ready for surgery .

## 2022-04-06 NOTE — H&P (Signed)
TOTAL KNEE ADMISSION H&P  Patient is being admitted for left total knee arthroplasty.  Subjective:  Chief Complaint:left knee pain.  HPI: [name], 88 y.o. female, has a history of pain and functional disability in the left knee due to arthritis and has failed non-surgical conservative treatments for greater than 12 weeks to includeNSAID's and/or analgesics, corticosteriod injections, viscosupplementation injections, use of assistive devices, weight reduction as appropriate, and activity modification.  Onset of symptoms was gradual, starting >10 years ago with gradually worsening course since that time. The patient noted no past surgery on the left knee(s).  Patient currently rates pain in the left knee(s) at 10 out of 10 with activity. Patient has night pain, worsening of pain with activity and weight bearing, pain that interferes with activities of daily living, pain with passive range of motion, crepitus, and joint swelling.  Patient has evidence of periarticular osteophytes and joint space narrowing by imaging studies.  There is no active infection.  Patient Active Problem List   Diagnosis Date Noted   Degenerative arthritis of left knee 01/18/2021   S/P reverse total shoulder arthroplasty, 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   Primary osteoarthritis of right hip 01/20/2018   Osteoarthritis of right knee 01/18/2018   Chronic pain syndrome    Anxiety    Right hip pain 11/29/2017   CKD (chronic kidney disease) stage 2, GFR 60-89 ml/min 11/29/2017   Prosthetic joint infection ([place])    Renal failure 05/27/2012   HTN (hypertension) 10/17/2011   Infected prosthetic knee joint ([place]) 09/10/2011   Past Medical History:  Diagnosis Date   Anxiety    Arthritis    Asthma    Cancer ([place])    SKIN CA OF FACE   Chronic kidney disease    Chronic pain    Depression    Gallstones    GERD (gastroesophageal reflux disease)    Hypertension    Insomnia    Pneumonia    Prosthetic joint infection ([place])      Past Surgical History:  Procedure Laterality Date    ARM FRACTURE     LEFT  and right   CHOLECYSTECTOMY     DILATION AND CURETTAGE OF UTERUS     IRRIGATION AND DEBRIDEMENT KNEE  09/12/2011   Procedure: IRRIGATION AND DEBRIDEMENT KNEE;  Surgeon: Kerin Salen;  Location: [facility];  Service: Orthopedics;  Laterality: Right;  I&D RIGHT TKA REVISE BEARINGS/MBT. Poly exchange   REPLACEMENT TOTAL KNEE     right   TOTAL HIP ARTHROPLASTY Left    TOTAL HIP ARTHROPLASTY Right 01/20/2018   Procedure: TOTAL HIP ARTHROPLASTY WITH REMOVAL OF INTRAMEDULLARY NAIL;  Surgeon: Frederik Pear, MD;  Location: [facility];  Service: Orthopedics;  Laterality: Right;   TOTAL SHOULDER ARTHROPLASTY Right 10/02/2018   Procedure: REVERSE TOTAL SHOULDER;  Surgeon: Tania Ade, MD;  Location: [facility];  Service: Orthopedics;  Laterality: Right;    No current facility-administered medications for this encounter.   Current Outpatient Medications  Medication Sig Dispense Refill Last Dose   albuterol (PROVENTIL HFA;VENTOLIN HFA) 108 (90 BASE) MCG/ACT inhaler Inhale 2 puffs into the lungs every 6 (six) hours as needed for shortness of breath or wheezing.      ALPRAZolam (XANAX) 0.5 MG tablet Take 0.5 mg by mouth 2 (two) times daily as needed for anxiety.      atenolol (TENORMIN) 50 MG tablet Take 1.5 tablets (75 mg total) by mouth daily. (Patient taking differently: Take 50 mg by mouth daily.) 60 tablet 0  B Complex-C (SUPER B COMPLEX PO) Take 1 tablet by mouth daily.      busPIRone (BUSPAR) 5 MG tablet Take 5 mg by mouth 2 (two) times daily.      carvedilol (COREG) 25 MG tablet Take 25 mg by mouth 2 (two) times daily.      cloNIDine (CATAPRES) 0.2 MG tablet Take 1 tablet (0.2 mg total) by mouth 2 (two) times daily. 60 tablet 1    diclofenac Sodium (VOLTAREN) 1 % GEL Apply 2 g topically 4 (four) times daily as needed for pain.      ezetimibe (ZETIA) 10 MG tablet Take 10 mg by mouth daily.      famotidine (PEPCID) 20 MG tablet  Take 20 mg by mouth 2 (two) times daily.      fluticasone (FLOVENT HFA) 110 MCG/ACT inhaler Inhale 1 puff into the lungs daily as needed (asthma).      furosemide (LASIX) 40 MG tablet Take 1 tablet (40 mg total) by mouth daily. (Patient taking differently: Take 20 mg by mouth daily.)      gabapentin (NEURONTIN) 300 MG capsule Take 1 capsule (300 mg total) by mouth 3 (three) times daily. (Patient taking differently: Take 300 mg by mouth daily.) 90 capsule 1    hydrALAZINE (APRESOLINE) 25 MG tablet Take 12.5 mg by mouth 4 (four) times daily.      HYDROcodone-acetaminophen (NORCO) 10-325 MG tablet Take 1 tablet by mouth in the morning and at bedtime.      Multiple Vitamins-Minerals (MULTIVITAMIN WOMENS 50+ ADV) TABS Take 1 tablet by mouth daily.      naloxone (NARCAN) nasal spray 4 mg/0.1 mL       pantoprazole (PROTONIX) 40 MG tablet Take 40 mg by mouth daily.      vitamin C (ASCORBIC ACID) 500 MG tablet Take 500 mg by mouth daily.      zolpidem (AMBIEN) 5 MG tablet Take 5 mg by mouth at bedtime.      HYDROcodone-acetaminophen (NORCO/VICODIN) 5-325 MG tablet Take 1-2 tablets by mouth every 4 (four) hours as needed for moderate pain (pain score 4-6). (Patient not taking: Reported on 03/26/2022) 30 tablet 0 Not Taking   Allergies  Allergen Reactions   Lisinopril Swelling    angioedema   Hydrochlorothiazide Other (See Comments)    UNSPECIFIED REACTION    Lipitor [Atorvastatin Calcium] Other (See Comments)    UNSPECIFIED REACTION    Thiazide-Type Diuretics Other (See Comments)    UNSPECIFIED REACTION    Zocor [Simvastatin] Swelling    SWELLING REACTION UNSPECIFIED    Codeine Nausea Only    Social History   Tobacco Use   Smoking status: Former    Packs/day: 0.25    Years: 10.00    Total pack years: 2.50    Types: Cigarettes    Quit date: 1980    Years since quitting: 43.5   Smokeless tobacco: Never   Tobacco comments:    Quit 50 years ago  Substance Use Topics   Alcohol use: No     Family History  Problem Relation Age of Onset   Diabetes Mellitus II Daughter      Review of Systems  Constitutional:  Positive for diaphoresis and fatigue.  HENT:  Positive for hearing loss.   Respiratory: Negative.    Cardiovascular:  Positive for leg swelling.  Gastrointestinal:  Positive for constipation, nausea and vomiting.  Genitourinary: Negative.   Musculoskeletal:  Positive for arthralgias and myalgias.  Allergic/Immunologic: Negative.   Neurological:  Positive  for weakness.  Psychiatric/Behavioral:  Positive for sleep disturbance. The patient is nervous/anxious.     Objective:  Physical Exam Constitutional:      Appearance: Normal appearance. She is normal weight.  HENT:     Head: Normocephalic and atraumatic.     Nose: Nose normal.     Mouth/Throat:     Mouth: Mucous membranes are moist.     Pharynx: Oropharynx is clear.  Eyes:     Pupils: Pupils are equal, round, and reactive to light.  Cardiovascular:     Pulses: Normal pulses.  Pulmonary:     Effort: Pulmonary effort is normal.  Musculoskeletal:     Cervical back: Normal range of motion and neck supple.     Comments: the patient's right knee has good strength.  Patient's left knee does have reduced range of motion from 10 to 105.  Varus deformity.  Calves are soft and nontender.  She has obvious crepitance with range of motion.  Patient's wound on the left lower leg has healed.  Skin:    General: Skin is warm and dry.  Neurological:     General: No focal deficit present.     Mental Status: She is alert and oriented to person, place, and time. Mental status is at baseline.  Psychiatric:        Mood and Affect: Mood normal.        Thought Content: Thought content normal.        Judgment: Judgment normal.     Vital signs in last 24 hours:    Labs:   Estimated body mass index is 28.87 kg/m as calculated from the following:   Height as of 03/29/22: '5\' 3"'$  (1.6 m).   Weight as of 03/29/22: 73.9  kg.   Imaging Review Plain radiographs demonstrate bilateral AP weightbearing, bilateral Rosenberg, lateral sunrise views of bilateral knees are taken and reviewed in office today.  This shows a well-placed well fixed right total knee arthroplasty with long tibial stem.  Patient's left knee has end-stage bone-on-bone arthritis medial compartment with varus deformity.  Partial collapse of the medial tibial plateau with subchondral sclerosis and osteophyte formation.      Assessment/Plan:  End stage  arthritis, left knee   The patient history, physical examination, clinical judgment of the provider and imaging studies are consistent with end stage degenerative joint disease of the left knee(s) and total knee arthroplasty is deemed medically necessary. The treatment options including medical management, injection therapy arthroscopy and arthroplasty were discussed at length. The risks and benefits of total knee arthroplasty were presented and reviewed. The risks due to aseptic loosening, infection, stiffness, patella tracking problems, thromboembolic complications and other imponderables were discussed. The patient acknowledged the explanation, agreed to proceed with the plan and consent was signed. Patient is being admitted for inpatient treatment for surgery, pain control, PT, OT, prophylactic antibiotics, VTE prophylaxis, progressive ambulation and ADL's and discharge planning. The patient is planning to be discharged home with home health services     Patient's anticipated LOS is less than 2 midnights, meeting these requirements: - Younger than 19 - Lives within 1 hour of care - Has a competent adult at home to recover with post-op recover - NO history of  - Chronic pain requiring opiods  - Diabetes  - Coronary Artery Disease  - Heart failure  - Heart attack  - Stroke  - DVT/VTE  - Cardiac arrhythmia  - Respiratory Failure/COPD  - Anemia  - Advanced Liver disease

## 2022-04-08 NOTE — Anesthesia Preprocedure Evaluation (Signed)
Anesthesia Evaluation    Reviewed: Allergy & Precautions, Patient's Chart, lab work & pertinent test results, reviewed documented beta blocker date and time   History of Anesthesia Complications Negative for: history of anesthetic complications  Airway        Dental  (+) Edentulous Upper, Edentulous Lower   Pulmonary asthma , former smoker,           Cardiovascular hypertension, Pt. on medications and Pt. on home beta blockers   RBBB   Neuro/Psych PSYCHIATRIC DISORDERS Anxiety Depression negative neurological ROS     GI/Hepatic Neg liver ROS, GERD  Controlled,  Endo/Other  negative endocrine ROS  Renal/GU Renal Insufficiency and CRFRenal disease (Cr 1.21)  negative genitourinary   Musculoskeletal  (+) Arthritis ,   Abdominal   Peds  Hematology negative hematology ROS (+)   Anesthesia Other Findings   Reproductive/Obstetrics                            Anesthesia Physical  Anesthesia Plan  ASA: 3  Anesthesia Plan: MAC, Regional and Spinal   Post-op Pain Management: Regional block* and Tylenol PO (pre-op)*   Induction: Intravenous  PONV Risk Score and Plan: 2 and Propofol infusion  Airway Management Planned: Natural Airway, Nasal Cannula and Simple Face Mask  Additional Equipment: None  Intra-op Plan:   Post-operative Plan:   Informed Consent: I have reviewed the patients History and Physical, chart, labs and discussed the procedure including the risks, benefits and alternatives for the proposed anesthesia with the patient or authorized representative who has indicated his/her understanding and acceptance.     Dental advisory given  Plan Discussed with: Anesthesiologist and CRNA  Anesthesia Plan Comments: (  Canceled for lesion near operative site )       Anesthesia Quick Evaluation

## 2022-04-09 ENCOUNTER — Encounter (HOSPITAL_COMMUNITY): Payer: Self-pay | Admitting: Orthopedic Surgery

## 2022-04-09 ENCOUNTER — Ambulatory Visit (HOSPITAL_COMMUNITY)
Admission: RE | Admit: 2022-04-09 | Discharge: 2022-04-09 | Disposition: A | Discharge status: Home / Self Care | Payer: Medicare Other | Source: Ambulatory Visit | Attending: Orthopedic Surgery | Admitting: Orthopedic Surgery

## 2022-04-09 ENCOUNTER — Encounter (HOSPITAL_COMMUNITY)
Admission: RE | Disposition: A | Discharge status: Home / Self Care | Payer: Self-pay | Source: Ambulatory Visit | Attending: Orthopedic Surgery

## 2022-04-09 DIAGNOSIS — Z538 Procedure and treatment not carried out for other reasons: Secondary | ICD-10-CM | POA: Diagnosis not present

## 2022-04-09 DIAGNOSIS — Z01818 Encounter for other preprocedural examination: Secondary | ICD-10-CM

## 2022-04-09 DIAGNOSIS — M1712 Unilateral primary osteoarthritis, left knee: Secondary | ICD-10-CM | POA: Diagnosis present

## 2022-04-09 DIAGNOSIS — E119 Type 2 diabetes mellitus without complications: Secondary | ICD-10-CM

## 2022-04-09 SURGERY — ARTHROPLASTY, KNEE, TOTAL
Anesthesia: Monitor Anesthesia Care

## 2022-04-09 MED ORDER — BUPIVACAINE-EPINEPHRINE (PF) 0.25% -1:200000 IJ SOLN
INTRAMUSCULAR | Status: AC
  Filled 2022-04-09: qty 30

## 2022-04-09 MED ORDER — CHLORHEXIDINE GLUCONATE 0.12 % MT SOLN: 15.0000 mL | Freq: Once | OROMUCOSAL | Status: DC

## 2022-04-09 MED ORDER — TRANEXAMIC ACID 1000 MG/10ML IV SOLN
2000.0000 mg | INTRAVENOUS | Status: DC
  Filled 2022-04-09: qty 20

## 2022-04-09 MED ORDER — BUPIVACAINE LIPOSOME 1.3 % IJ SUSP: 20.0000 mL | Freq: Once | INTRAMUSCULAR | Status: DC

## 2022-04-09 MED ORDER — LACTATED RINGERS IV SOLN: INTRAVENOUS | Status: DC

## 2022-04-09 MED ORDER — TRANEXAMIC ACID-NACL 1000-0.7 MG/100ML-% IV SOLN: 1000.0000 mg | INTRAVENOUS | Status: DC

## 2022-04-09 MED ORDER — PROPOFOL 1000 MG/100ML IV EMUL
INTRAVENOUS | Status: AC
  Filled 2022-04-09: qty 100

## 2022-04-09 MED ORDER — BUPIVACAINE LIPOSOME 1.3 % IJ SUSP
INTRAMUSCULAR | Status: AC
  Filled 2022-04-09: qty 20

## 2022-04-09 MED ORDER — LACTATED RINGERS IV SOLN
INTRAVENOUS | Status: DC
Start: 2022-04-09 — End: 2022-04-09

## 2022-04-09 MED ORDER — CEFAZOLIN SODIUM-DEXTROSE 2-4 GM/100ML-% IV SOLN: 2.0000 g | INTRAVENOUS | Status: DC

## 2022-04-09 MED ORDER — POVIDONE-IODINE 10 % EX SWAB: 2.0000 | Freq: Once | CUTANEOUS | Status: DC

## 2022-04-09 MED ORDER — ORAL CARE MOUTH RINSE: 15.0000 mL | Freq: Once | OROMUCOSAL | Status: DC

## 2022-04-09 MED ORDER — PHENYLEPHRINE HCL-NACL 20-0.9 MG/250ML-% IV SOLN
INTRAVENOUS | Status: AC
  Filled 2022-04-09: qty 250

## 2022-04-09 MED ORDER — SODIUM CHLORIDE (PF) 0.9 % IJ SOLN
INTRAMUSCULAR | Status: AC
  Filled 2022-04-09: qty 50

## 2022-04-09 SURGICAL SUPPLY — 46 items
BAG COUNTER SPONGE SURGICOUNT (BAG) IMPLANT
BAG DECANTER FOR FLEXI CONT (MISCELLANEOUS) ×3 IMPLANT
BAG SPEC THK2 15X12 ZIP CLS (MISCELLANEOUS) ×1
BAG SPNG CNTER NS LX DISP (BAG)
BAG ZIPLOCK 12X15 (MISCELLANEOUS) ×3 IMPLANT
BLADE SAG 18X100X1.27 (BLADE) ×3 IMPLANT
BLADE SAW SGTL 11.0X1.19X90.0M (BLADE) ×3 IMPLANT
BLADE SURG SZ10 CARB STEEL (BLADE) ×6 IMPLANT
BNDG CMPR MED 10X6 ELC LF (GAUZE/BANDAGES/DRESSINGS) ×1
BNDG ELASTIC 6X10 VLCR STRL LF (GAUZE/BANDAGES/DRESSINGS) ×3 IMPLANT
BOWL SMART MIX CTS (DISPOSABLE) ×3 IMPLANT
COVER SURGICAL LIGHT HANDLE (MISCELLANEOUS) ×3 IMPLANT
CUFF TOURN SGL QUICK 34 (TOURNIQUET CUFF) ×2
CUFF TRNQT CYL 34X4.125X (TOURNIQUET CUFF) ×2 IMPLANT
DRAPE INCISE IOBAN 66X45 STRL (DRAPES) ×3 IMPLANT
DRAPE U-SHAPE 47X51 STRL (DRAPES) ×3 IMPLANT
DRSG AQUACEL AG ADV 3.5X10 (GAUZE/BANDAGES/DRESSINGS) ×3 IMPLANT
DURAPREP 26ML APPLICATOR (WOUND CARE) ×3 IMPLANT
ELECT REM PT RETURN 15FT ADLT (MISCELLANEOUS) ×3 IMPLANT
GLOVE BIO SURGEON STRL SZ7.5 (GLOVE) ×3 IMPLANT
GLOVE BIO SURGEON STRL SZ8.5 (GLOVE) ×3 IMPLANT
GLOVE BIOGEL PI IND STRL 8 (GLOVE) ×2 IMPLANT
GLOVE BIOGEL PI IND STRL 9 (GLOVE) ×2 IMPLANT
GLOVE BIOGEL PI INDICATOR 8 (GLOVE) ×1
GLOVE BIOGEL PI INDICATOR 9 (GLOVE) ×1
GOWN STRL REUS W/ TWL XL LVL3 (GOWN DISPOSABLE) ×4 IMPLANT
GOWN STRL REUS W/TWL XL LVL3 (GOWN DISPOSABLE) ×4
HANDPIECE INTERPULSE COAX TIP (DISPOSABLE) ×2
HOOD PEEL AWAY FLYTE STAYCOOL (MISCELLANEOUS) ×9 IMPLANT
KIT TURNOVER KIT A (KITS) IMPLANT
NDL HYPO 21X1.5 SAFETY (NEEDLE) ×2 IMPLANT
NEEDLE HYPO 21X1.5 SAFETY (NEEDLE) ×4 IMPLANT
NS IRRIG 1000ML POUR BTL (IV SOLUTION) ×3 IMPLANT
PACK TOTAL KNEE CUSTOM (KITS) ×3 IMPLANT
PROTECTOR NERVE ULNAR (MISCELLANEOUS) ×3 IMPLANT
SET HNDPC FAN SPRY TIP SCT (DISPOSABLE) ×2 IMPLANT
SPIKE FLUID TRANSFER (MISCELLANEOUS) ×9 IMPLANT
SUT VIC AB 1 CTX 36 (SUTURE) ×2
SUT VIC AB 1 CTX36XBRD ANBCTR (SUTURE) ×2 IMPLANT
SUT VIC AB 3-0 CT1 27 (SUTURE) ×6
SUT VIC AB 3-0 CT1 TAPERPNT 27 (SUTURE) ×6 IMPLANT
SYR CONTROL 10ML LL (SYRINGE) ×6 IMPLANT
TRAY CATH INTERMITTENT SS 16FR (CATHETERS) IMPLANT
TRAY FOLEY MTR SLVR 16FR STAT (SET/KITS/TRAYS/PACK) ×3 IMPLANT
WATER STERILE IRR 1000ML POUR (IV SOLUTION) ×6 IMPLANT
WRAP KNEE MAXI GEL POST OP (GAUZE/BANDAGES/DRESSINGS) ×3 IMPLANT

## 2022-04-09 NOTE — Progress Notes (Signed)
Per Dr Mayer Camel. Patinets procedure has been cancelled for today due to an open wound on her left knee.  Patient and Family are aware and verbalized understanding.   Patient/family instructed to follow up with Dr Shaune Spittle office

## 2022-08-23 ENCOUNTER — Other Ambulatory Visit: Payer: Self-pay | Admitting: Orthopedic Surgery

## 2022-08-24 NOTE — Progress Notes (Signed)
Sent message, via epic in basket, requesting orders in epic from surgeon.  

## 2022-08-30 NOTE — Patient Instructions (Addendum)
SURGICAL WAITING ROOM VISITATION Patients having surgery or a procedure may have no more than 2 support people in the waiting area - these visitors may rotate.   Children under the age of 58 must have an adult with them who is not the patient. If the patient needs to stay at the hospital during part of their recovery, the visitor guidelines for inpatient rooms apply. Pre-op nurse will coordinate an appropriate time for 1 support person to accompany patient in pre-op.  This support person may not rotate.    Please refer to the [facility] website for the visitor guidelines for Inpatients (after your surgery is over and you are in a regular room).       Your procedure is scheduled on: 09-03-22   Report to [facility] Main Entrance    Report to admitting at      1015  AM   Call this number if you have problems the morning of surgery [phone]   Do not eat food :After Midnight.   After Midnight you may have the following liquids until _0945_____ AM DAY OF SURGERY  then nothing by mouth  Water Non-Citrus Juices (without pulp, NO RED) Carbonated Beverages Black Coffee (NO MILK/CREAM OR CREAMERS, sugar ok)  Clear Tea (NO MILK/CREAM OR CREAMERS, sugar ok) regular and decaf                             Plain Jell-O (NO RED)                                           Fruit ices (not with fruit pulp, NO RED)                                     Popsicles (NO RED)                                                               Sports drinks like Gatorade (NO RED)                    The day of surgery:  Drink ONE (1) Pre-Surgery  G2 at     0930  AM the morning of surgery. Drink in one sitting. Do not sip.  This drink was given to you during your hospital  pre-op appointment visit. Nothing else to drink after completing the  Pre-Surgery Clear Ensure or G2.          If you have questions, please contact your surgeon's office.   FOLLOW ANY ADDITIONAL PRE OP INSTRUCTIONS YOU  RECEIVED FROM YOUR SURGEON'S OFFICE!!!     Oral Hygiene is also important to reduce your risk of infection.                                    Remember - BRUSH YOUR TEETH THE MORNING OF SURGERY WITH YOUR REGULAR TOOTHPASTE  DENTURES WILL BE REMOVED PRIOR TO SURGERY PLEASE DO NOT APPLY "Poly  grip" OR ADHESIVES!!!   Do NOT smoke after Midnight   Take these medicines the morning of surgery with A SIP OF WATER: Hydralazine, gabapentin, inhalers bring with you, carvedilol, clonidine, atenolol, pantoprazole                           You may not have any metal on your body including hair pins, jewelry, and body piercing             Do not wear make-up, lotions, powders, perfumes/cologne, or deodorant  Do not wear nail polish including gel and S&S, artificial/acrylic nails, or any other type of covering on natural nails including finger and toenails. If you have artificial nails, gel coating, etc. that needs to be removed by a nail salon please have this removed prior to surgery or surgery may need to be canceled/ delayed if the surgeon/ anesthesia feels like they are unable to be safely monitored.   Do not shave  48 hours prior to surgery.              Do not bring valuables to the hospital. [place].   Contacts, glasses, dentures or bridgework may not be worn into surgery.   Bring small overnight bag day of surgery.   DO NOT [place]. PHARMACY WILL DISPENSE MEDICATIONS LISTED ON YOUR MEDICATION LIST TO YOU DURING YOUR ADMISSION [place]!    Patients discharged on the day of surgery will not be allowed to drive home.  Someone NEEDS to stay with you for the first 24 hours after anesthesia.                Please read over the following fact sheets you were given: IF [place] [phone]   If you received a COVID test during your pre-op visit  it  is requested that you wear a mask when out in public, stay away from anyone that may not be feeling well and notify your surgeon if you develop symptoms. If you test positive for Covid or have been in contact with anyone that has tested positive in the last 10 days please notify you surgeon.    [place] - Preparing for Surgery Before surgery, you can play an important role.  Because skin is not sterile, your skin needs to be as free of germs as possible.  You can reduce the number of germs on your skin by washing with CHG (chlorahexidine gluconate) soap before surgery.  CHG is an antiseptic cleaner which kills germs and bonds with the skin to continue killing germs even after washing. Please DO NOT use if you have an allergy to CHG or antibacterial soaps.  If your skin becomes reddened/irritated stop using the CHG and inform your nurse when you arrive at Short Stay. Do not shave (including legs and underarms) for at least 48 hours prior to the first CHG shower.  You may shave your face/neck. Please follow these instructions carefully:  1.  Shower with CHG Soap the night before surgery and the  morning of Surgery.  2.  If you choose to wash your hair, wash your hair first as usual with your  normal  shampoo.  3.  After you shampoo, rinse your hair and body thoroughly to remove the  shampoo.  4.  Use CHG as you would any other liquid soap.  You can apply chg directly  to the skin and wash                       Gently with a scrungie or clean washcloth.  5.  Apply the CHG Soap to your body ONLY FROM THE NECK DOWN.   Do not use on face/ open                           Wound or open sores. Avoid contact with eyes, ears mouth and genitals (private parts).                       Wash face,  Genitals (private parts) with your normal soap.             6.  Wash thoroughly, paying special attention to the area where your surgery  will be performed.  7.  Thoroughly rinse your body with  warm water from the neck down.  8.  DO NOT shower/wash with your normal soap after using and rinsing off  the CHG Soap.                9.  Pat yourself dry with a clean towel.            10.  Wear clean pajamas.            11.  Place clean sheets on your bed the night of your first shower and do not  sleep with pets. Day of Surgery : Do not apply any lotions/deodorants the morning of surgery.  Please wear clean clothes to the hospital/surgery center.  FAILURE TO FOLLOW THESE INSTRUCTIONS MAY RESULT IN THE CANCELLATION OF YOUR SURGERY PATIENT SIGNATURE_________________________________  NURSE SIGNATURE__________________________________  ________________________________________________________________________  [name]  An incentive spirometer is a tool that can help keep your lungs clear and active. This tool measures how well you are filling your lungs with each breath. Taking long deep breaths may help reverse or decrease the chance of developing breathing (pulmonary) problems (especially infection) following: A long period of time when you are unable to move or be active. BEFORE THE PROCEDURE  If the spirometer includes an indicator to show your best effort, your nurse or respiratory therapist will set it to a desired goal. If possible, sit up straight or lean slightly forward. Try not to slouch. Hold the incentive spirometer in an upright position. INSTRUCTIONS FOR USE  Sit on the edge of your bed if possible, or sit up as far as you can in bed or on a chair. Hold the incentive spirometer in an upright position. Breathe out normally. Place the mouthpiece in your mouth and seal your lips tightly around it. Breathe in slowly and as deeply as possible, raising the piston or the ball toward the top of the column. Hold your breath for 3-5 seconds or for as long as possible. Allow the piston or ball to fall to the bottom of the column. Remove the mouthpiece from your mouth and  breathe out normally. Rest for a few seconds and repeat Steps 1 through 7 at least 10 times every 1-2 hours when you are awake. Take your time and take a few normal breaths between deep breaths. The spirometer may include an indicator to show your best effort. Use the indicator as a goal to work toward during  each repetition. After each set of 10 deep breaths, practice coughing to be sure your lungs are clear. If you have an incision (the cut made at the time of surgery), support your incision when coughing by placing a pillow or rolled up towels firmly against it. Once you are able to get out of bed, walk around indoors and cough well. You may stop using the incentive spirometer when instructed by your caregiver.  RISKS AND COMPLICATIONS Take your time so you do not get dizzy or light-headed. If you are in pain, you may need to take or ask for pain medication before doing incentive spirometry. It is harder to take a deep breath if you are having pain. AFTER USE Rest and breathe slowly and easily. It can be helpful to keep track of a log of your progress. Your caregiver can provide you with a simple table to help with this. If you are using the spirometer at home, follow these instructions: [place] IF:  You are having difficultly using the spirometer. You have trouble using the spirometer as often as instructed. Your pain medication is not giving enough relief while using the spirometer. You develop fever of 100.5 F (38.1 C) or higher. SEEK IMMEDIATE MEDICAL CARE IF:  You cough up bloody sputum that had not been present before. You develop fever of 102 F (38.9 C) or greater. You develop worsening pain at or near the incision site. MAKE SURE YOU:  Understand these instructions. Will watch your condition. Will get help right away if you are not doing well or get worse. Document Released: 01/21/2007 Document Revised: 12/03/2011 Document Reviewed: 03/24/2007 [facility] Patient  Information 2014 [place], [place].   ________________________________________________________________________

## 2022-08-30 NOTE — Progress Notes (Signed)
PCP - Ledora Bottcher ,MD LOV 05-16-22 Cardiologist - Preop eval 07-19-22 epic Marina Goodell, MD  PPM/ICD -  Device Orders -  Rep Notified -   Chest x-ray -  EKG -  Stress Test -  ECHO -  Cardiac Cath -   Sleep Study -  CPAP -   Fasting Blood Sugar -  Checks Blood Sugar _____ times a day  Blood Thinner Instructions: Aspirin Instructions:  ERAS Protcol - PRE-SURGERY Ensure or G2-   COVID TEST-  COVID vaccine -  Activity-- Anesthesia review: HTN, DM, TIA, CKD,Asthma  Patient denies shortness of breath, fever, cough and chest pain at PAT appointment   All instructions explained to the patient, with a verbal understanding of the material. Patient agrees to go over the instructions while at home for a better understanding. Patient also instructed to self quarantine after being tested for COVID-19. The opportunity to ask questions was provided.

## 2022-08-30 NOTE — H&P (Signed)
TOTAL KNEE ADMISSION H&P  Patient is being admitted for left total knee arthroplasty.  Subjective:  Chief Complaint:left knee pain.  HPI: [name], 88 y.o. female, has a history of pain and functional disability in the left knee due to arthritis and has failed non-surgical conservative treatments for greater than 12 weeks to includeNSAID's and/or analgesics, use of assistive devices, weight reduction as appropriate, and activity modification.  Onset of symptoms was gradual, starting >10 years ago with gradually worsening course since that time. The patient noted no past surgery on the left knee(s).  Patient currently rates pain in the left knee(s) at 10 out of 10 with activity. Patient has night pain, worsening of pain with activity and weight bearing, pain that interferes with activities of daily living, pain with passive range of motion, crepitus, and joint swelling.  Patient has evidence of periarticular osteophytes, joint subluxation, and joint space narrowing by imaging studies.  There is no active infection.  Patient Active Problem List   Diagnosis Date Noted   Degenerative arthritis of left knee 01/18/2021   S/P reverse total shoulder arthroplasty, 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   Primary osteoarthritis of right hip 01/20/2018   Osteoarthritis of right knee 01/18/2018   Chronic pain syndrome    Anxiety    Right hip pain 11/29/2017   CKD (chronic kidney disease) stage 2, GFR 60-89 ml/min 11/29/2017   Prosthetic joint infection ([place])    Renal failure 05/27/2012   HTN (hypertension) 10/17/2011   Infected prosthetic knee joint ([place]) 09/10/2011   Past Medical History:  Diagnosis Date   Anxiety    Arthritis    Asthma    Cancer ([place])    SKIN CA OF FACE   Chronic kidney disease    Chronic pain    Depression    Gallstones    GERD (gastroesophageal reflux disease)    Hypertension    Insomnia    Pneumonia    Prosthetic 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 Poly exchange   REPLACEMENT TOTAL KNEE     right   TOTAL HIP ARTHROPLASTY Left    TOTAL HIP ARTHROPLASTY Right 01/20/2018   Procedure: TOTAL HIP ARTHROPLASTY WITH REMOVAL OF INTRAMEDULLARY NAIL;  Surgeon: Frederik Pear, MD;  Location: [facility];  Service: Orthopedics;  Laterality: Right;   TOTAL SHOULDER ARTHROPLASTY Right 10/02/2018   Procedure: REVERSE TOTAL SHOULDER;  Surgeon: Tania Ade, MD;  Location: [facility];  Service: Orthopedics;  Laterality: Right;    No current facility-administered medications for this encounter.   Current Outpatient Medications  Medication Sig Dispense Refill Last Dose   albuterol (PROVENTIL HFA;VENTOLIN HFA) 108 (90 BASE) MCG/ACT inhaler Inhale 2 puffs into the lungs every 6 (six) hours as needed for shortness of breath or wheezing.      ALPRAZolam (XANAX) 0.5 MG tablet Take 0.5 mg by mouth 2 (two) times daily as needed for anxiety.      atenolol (TENORMIN) 50 MG tablet Take 1.5 tablets (75 mg total) by mouth daily. (Patient taking differently: Take 50 mg by mouth daily.) 60 tablet 0    B Complex-C (  SUPER B COMPLEX PO) Take 1 tablet by mouth daily.      busPIRone (BUSPAR) 5 MG tablet Take 5 mg by mouth 2 (two) times daily.      carvedilol (COREG) 25 MG tablet Take 25 mg by mouth 2 (two) times daily.      cloNIDine (CATAPRES) 0.2 MG tablet Take 1 tablet (0.2 mg total) by mouth 2 (two) times daily. 60 tablet 1    diclofenac Sodium (VOLTAREN) 1 % GEL Apply 2 g topically 4 (four) times daily as needed for pain.      ezetimibe (ZETIA) 10 MG tablet Take 10 mg by mouth daily.      famotidine (PEPCID) 20 MG tablet Take 20 mg by mouth 2 (two) times  daily.      fluticasone (FLOVENT HFA) 110 MCG/ACT inhaler Inhale 1 puff into the lungs daily as needed (asthma).      furosemide (LASIX) 40 MG tablet Take 1 tablet (40 mg total) by mouth daily. (Patient taking differently: Take 20 mg by mouth daily.)      gabapentin (NEURONTIN) 300 MG capsule Take 1 capsule (300 mg total) by mouth 3 (three) times daily. (Patient taking differently: Take 300 mg by mouth daily.) 90 capsule 1    hydrALAZINE (APRESOLINE) 25 MG tablet Take 12.5 mg by mouth 4 (four) times daily.      HYDROcodone-acetaminophen (NORCO) 10-325 MG tablet Take 1 tablet by mouth in the morning and at bedtime.      HYDROcodone-acetaminophen (NORCO/VICODIN) 5-325 MG tablet Take 1-2 tablets by mouth every 4 (four) hours as needed for moderate pain (pain score 4-6). (Patient not taking: Reported on 03/26/2022) 30 tablet 0    Multiple Vitamins-Minerals (MULTIVITAMIN WOMENS 50+ ADV) TABS Take 1 tablet by mouth daily.      naloxone (NARCAN) nasal spray 4 mg/0.1 mL       pantoprazole (PROTONIX) 40 MG tablet Take 40 mg by mouth daily.      vitamin C (ASCORBIC ACID) 500 MG tablet Take 500 mg by mouth daily.      zolpidem (AMBIEN) 5 MG tablet Take 5 mg by mouth at bedtime.      Allergies  Allergen Reactions   Lisinopril Swelling    angioedema   Hydrochlorothiazide Other (See Comments)    UNSPECIFIED REACTION    Lipitor [Atorvastatin Calcium] Other (See Comments)    UNSPECIFIED REACTION    Thiazide-Type Diuretics Other (See Comments)    UNSPECIFIED REACTION    Zocor [Simvastatin] Swelling    SWELLING REACTION UNSPECIFIED    Codeine Nausea Only    Social History   Tobacco Use   Smoking status: Former    Packs/day: 0.25    Years: 10.00    Total pack years: 2.50    Types: Cigarettes    Quit date: 1980    Years since quitting: 43.9   Smokeless tobacco: Never   Tobacco comments:    Quit 50 years ago  Substance Use Topics   Alcohol use: No    Family History  Problem Relation Age of Onset    Diabetes Mellitus II Daughter      Review of Systems  Constitutional:  Positive for diaphoresis and fatigue.  HENT:  Positive for hearing loss.   Eyes:        Poor vision  Respiratory: Negative.    Cardiovascular:  Positive for leg swelling.  Gastrointestinal:  Positive for abdominal pain, constipation and nausea.  Genitourinary: Negative.   Musculoskeletal:  Positive for arthralgias and  myalgias.  Allergic/Immunologic: Negative.   Neurological: Negative.   Psychiatric/Behavioral:  Positive for sleep disturbance. The patient is nervous/anxious.        Depression    Objective:  Physical Exam Constitutional:      Appearance: Normal appearance. She is normal weight.  HENT:     Head: Normocephalic and atraumatic.     Nose: Nose normal.  Eyes:     Pupils: Pupils are equal, round, and reactive to light.  Cardiovascular:     Pulses: Normal pulses.  Pulmonary:     Effort: Pulmonary effort is normal.  Musculoskeletal:        General: Tenderness and deformity present.     Cervical back: Normal range of motion and neck supple.     Comments: Ulceration is healed.  She still has impressive varus deformity to her knee tender along the medial joint line good quadriceps and hamstring power.    Skin:    General: Skin is warm and dry.  Neurological:     General: No focal deficit present.     Mental Status: She is alert and oriented to person, place, and time. Mental status is at baseline.  Psychiatric:        Mood and Affect: Mood normal.        Behavior: Behavior normal.        Thought Content: Thought content normal.        Judgment: Judgment normal.     Vital signs in last 24 hours:    Labs:   Estimated body mass index is 28.87 kg/m as calculated from the following:   Height as of 03/29/22: '5\' 3"'$  (1.6 m).   Weight as of 03/29/22: 73.9 kg.   Imaging Review Plain radiographs demonstrate erosion of the medial tibial plateau 3-4 mm with a varus  deformity.      Assessment/Plan:  End stage arthritis, left knee   The patient history, physical examination, clinical judgment of the provider and imaging studies are consistent with end stage degenerative joint disease of the left knee(s) and total knee arthroplasty is deemed medically necessary. The treatment options including medical management, injection therapy arthroscopy and arthroplasty were discussed at length. The risks and benefits of total knee arthroplasty were presented and reviewed. The risks due to aseptic loosening, infection, stiffness, patella tracking problems, thromboembolic complications and other imponderables were discussed. The patient acknowledged the explanation, agreed to proceed with the plan and consent was signed. Patient is being admitted for inpatient treatment for surgery, pain control, PT, OT, prophylactic antibiotics, VTE prophylaxis, progressive ambulation and ADL's and discharge planning. The patient is planning to be discharged home with home health services     Patient's anticipated LOS is less than 2 midnights, meeting these requirements: - Younger than 2 - Lives within 1 hour of care - Has a competent adult at home to recover with post-op recover - NO history of  - Chronic pain requiring opiods  - Diabetes  - Coronary Artery Disease  - Heart failure  - Heart attack  - Stroke  - DVT/VTE  - Cardiac arrhythmia  - Respiratory Failure/COPD  - Renal failure  - Anemia  - Advanced Liver disease

## 2022-08-31 ENCOUNTER — Other Ambulatory Visit: Payer: Self-pay

## 2022-08-31 ENCOUNTER — Encounter (HOSPITAL_COMMUNITY)
Admission: RE | Admit: 2022-08-31 | Discharge: 2022-08-31 | Disposition: A | Discharge status: Home / Self Care | Payer: Medicare Other | Source: Ambulatory Visit | Attending: Orthopedic Surgery | Admitting: Orthopedic Surgery

## 2022-08-31 ENCOUNTER — Encounter (HOSPITAL_COMMUNITY): Payer: Self-pay

## 2022-08-31 VITALS — BP 156/63 | HR 67 | Temp 97.7°F | Resp 16 | Ht 63.5 in | Wt 175.0 lb

## 2022-08-31 DIAGNOSIS — M1712 Unilateral primary osteoarthritis, left knee: Secondary | ICD-10-CM | POA: Diagnosis not present

## 2022-08-31 DIAGNOSIS — Z01818 Encounter for other preprocedural examination: Secondary | ICD-10-CM | POA: Diagnosis present

## 2022-08-31 DIAGNOSIS — I10 Essential (primary) hypertension: Secondary | ICD-10-CM | POA: Diagnosis not present

## 2022-08-31 LAB — CBC
HCT: 38.5 % (ref 36.0–46.0)
Hemoglobin: 11.9 g/dL — ABNORMAL LOW (ref 12.0–15.0)
MCH: 31.2 pg (ref 26.0–34.0)
MCHC: 30.9 g/dL (ref 30.0–36.0)
MCV: 100.8 fL — ABNORMAL HIGH (ref 80.0–100.0)
Platelets: 170 10*3/uL (ref 150–400)
RBC: 3.82 MIL/uL — ABNORMAL LOW (ref 3.87–5.11)
RDW: 13.2 % (ref 11.5–15.5)
WBC: 6.9 10*3/uL (ref 4.0–10.5)
nRBC: 0 % (ref 0.0–0.2)

## 2022-08-31 LAB — BASIC METABOLIC PANEL
Anion gap: 4 — ABNORMAL LOW (ref 5–15)
BUN: 34 mg/dL — ABNORMAL HIGH (ref 8–23)
CO2: 22 mmol/L (ref 22–32)
Calcium: 8.6 mg/dL — ABNORMAL LOW (ref 8.9–10.3)
Chloride: 114 mmol/L — ABNORMAL HIGH (ref 98–111)
Creatinine, Ser: 1.53 mg/dL — ABNORMAL HIGH (ref 0.44–1.00)
GFR, Estimated: 33 mL/min — ABNORMAL LOW (ref >60)
Glucose, Bld: 114 mg/dL — ABNORMAL HIGH (ref 70–99)
Potassium: 5.8 mmol/L — ABNORMAL HIGH (ref 3.5–5.1)
Sodium: 140 mmol/L (ref 135–145)

## 2022-08-31 LAB — GLUCOSE, CAPILLARY: Glucose-Capillary: 123 mg/dL — ABNORMAL HIGH (ref 70–99)

## 2022-09-01 LAB — HEMOGLOBIN A1C
Hgb A1c MFr Bld: 6 % — ABNORMAL HIGH (ref 4.8–5.6)
Mean Plasma Glucose: 126 mg/dL

## 2022-09-02 NOTE — Anesthesia Preprocedure Evaluation (Addendum)
Anesthesia Evaluation  Patient identified by MRN, date of birth, ID band Patient awake    Reviewed: Allergy & Precautions, NPO status , Patient's Chart, lab work & pertinent test results  History of Anesthesia Complications Negative for: history of anesthetic complications  Airway Mallampati: III  TM Distance: >3 FB     Dental  (+) Upper Dentures, Lower Dentures   Pulmonary asthma (no recent inhaler use) , neg sleep apnea, neg COPD, neg recent URI, former smoker   Pulmonary exam normal breath sounds clear to auscultation       Cardiovascular hypertension, Pt. on home beta blockers and Pt. on medications (-) angina (-) Past MI, (-) Cardiac Stents and (-) CABG + dysrhythmias (RBBB, PACs)  Rhythm:Regular Rate:Normal  HLD  Recent cardiac echo from May, 2023 - normal LV systolic function with EF 55-60%,  normal LV wall motion,  Moderate LV diastolic dysfunction,  no significant valvular disease,  no pulmonary hypertension.  Nuclear stress test from Aug, 2021 revealed no evidence of ischemia,  doubtful for 2 tiny scars, normal LV systolic function with LF EF 66%.  Normal wall motion.  It was low risk stress test.   Holter monitor from April 16, 2019 was overall unremarkable. Min HR 51, max HR 109 and average 65 bpm.    No significant tightness noted.    About 39% of total time patient was bradycardic but bradycardia was only mild.     Neuro/Psych  PSYCHIATRIC DISORDERS Anxiety Depression    Chronic pain    GI/Hepatic Neg liver ROS,GERD  ,,  Endo/Other  diabetes (Hgb A1c 6), Well Controlled, Type 2    Renal/GU CRFRenal disease     Musculoskeletal  (+) Arthritis ,    Abdominal   Peds  Hematology negative hematology ROS (+)   Anesthesia Other Findings   Reproductive/Obstetrics                             Anesthesia Physical Anesthesia Plan  ASA: 3  Anesthesia Plan: Spinal   Post-op  Pain Management: Regional block* and Tylenol PO (pre-op)*   Induction: Intravenous  PONV Risk Score and Plan: 2 and Ondansetron, Dexamethasone, Propofol infusion and Treatment may vary due to age or medical condition  Airway Management Planned: Natural Airway and Simple Face Mask  Additional Equipment:   Intra-op Plan:   Post-operative Plan:   Informed Consent: I have reviewed the patients History and Physical, chart, labs and discussed the procedure including the risks, benefits and alternatives for the proposed anesthesia with the patient or authorized representative who has indicated his/her understanding and acceptance.     Dental advisory given  Plan Discussed with: CRNA and Anesthesiologist  Anesthesia Plan Comments: (I have discussed risks of neuraxial anesthesia including but not limited to infection, bleeding, nerve injury, back pain, headache, seizures, and failure of block. Patient denies bleeding disorders and is not currently anticoagulated. Labs have been reviewed. Risks and benefits discussed. All patient's questions answered.   Discussed potential risks of nerve blocks including, but not limited to, infection, bleeding, nerve damage, seizures, pneumothorax, respiratory depression, and potential failure of the block. Alternatives to nerve blocks discussed. All questions answered.  Discussed with patient risks of MAC including, but not limited to, minor pain or discomfort, hearing people in the room, and possible need for backup general anesthesia. Risks for general anesthesia also discussed including, but not limited to, sore throat, hoarse voice, chipped/damaged teeth, injury to vocal  cords, nausea and vomiting, allergic reactions, lung infection, heart attack, stroke, and death. All questions answered. )        Anesthesia Quick Evaluation

## 2022-09-03 ENCOUNTER — Encounter (HOSPITAL_COMMUNITY)
Admission: RE | Disposition: A | Discharge status: Home / Self Care | Payer: Self-pay | Source: Home / Self Care | Attending: Orthopedic Surgery

## 2022-09-03 ENCOUNTER — Ambulatory Visit (HOSPITAL_COMMUNITY): Payer: Medicare Other | Admitting: Physician Assistant

## 2022-09-03 ENCOUNTER — Other Ambulatory Visit: Payer: Self-pay

## 2022-09-03 ENCOUNTER — Ambulatory Visit (HOSPITAL_BASED_OUTPATIENT_CLINIC_OR_DEPARTMENT_OTHER): Payer: Medicare Other | Admitting: Anesthesiology

## 2022-09-03 ENCOUNTER — Ambulatory Visit (HOSPITAL_COMMUNITY)
Admission: RE | Admit: 2022-09-03 | Discharge: 2022-09-04 | Disposition: A | Discharge status: Home / Self Care | Payer: Medicare Other | Attending: Orthopedic Surgery | Admitting: Orthopedic Surgery

## 2022-09-03 ENCOUNTER — Encounter (HOSPITAL_COMMUNITY): Payer: Self-pay | Admitting: Orthopedic Surgery

## 2022-09-03 DIAGNOSIS — N182 Chronic kidney disease, stage 2 (mild): Secondary | ICD-10-CM | POA: Insufficient documentation

## 2022-09-03 DIAGNOSIS — E1122 Type 2 diabetes mellitus with diabetic chronic kidney disease: Secondary | ICD-10-CM | POA: Insufficient documentation

## 2022-09-03 DIAGNOSIS — M1712 Unilateral primary osteoarthritis, left knee: Secondary | ICD-10-CM | POA: Insufficient documentation

## 2022-09-03 DIAGNOSIS — I129 Hypertensive chronic kidney disease with stage 1 through stage 4 chronic kidney disease, or unspecified chronic kidney disease: Secondary | ICD-10-CM

## 2022-09-03 DIAGNOSIS — Z87891 Personal history of nicotine dependence: Secondary | ICD-10-CM | POA: Insufficient documentation

## 2022-09-03 DIAGNOSIS — J45909 Unspecified asthma, uncomplicated: Secondary | ICD-10-CM | POA: Insufficient documentation

## 2022-09-03 DIAGNOSIS — N189 Chronic kidney disease, unspecified: Secondary | ICD-10-CM

## 2022-09-03 DIAGNOSIS — J4489 Other specified chronic obstructive pulmonary disease: Secondary | ICD-10-CM | POA: Insufficient documentation

## 2022-09-03 DIAGNOSIS — G894 Chronic pain syndrome: Secondary | ICD-10-CM | POA: Insufficient documentation

## 2022-09-03 DIAGNOSIS — Z01818 Encounter for other preprocedural examination: Secondary | ICD-10-CM

## 2022-09-03 DIAGNOSIS — Z96652 Presence of left artificial knee joint: Secondary | ICD-10-CM

## 2022-09-03 HISTORY — PX: TOTAL KNEE ARTHROPLASTY: SHX125

## 2022-09-03 LAB — POCT I-STAT, CHEM 8
BUN: 24 mg/dL — ABNORMAL HIGH (ref 8–23)
Calcium, Ion: 1.2 mmol/L (ref 1.15–1.40)
Chloride: 107 mmol/L (ref 98–111)
Creatinine, Ser: 1.2 mg/dL — ABNORMAL HIGH (ref 0.44–1.00)
Glucose, Bld: 123 mg/dL — ABNORMAL HIGH (ref 70–99)
HCT: 40 % (ref 36.0–46.0)
Hemoglobin: 13.6 g/dL (ref 12.0–15.0)
Potassium: 4.8 mmol/L (ref 3.5–5.1)
Sodium: 138 mmol/L (ref 135–145)
TCO2: 21 mmol/L — ABNORMAL LOW (ref 22–32)

## 2022-09-03 LAB — SURGICAL PCR SCREEN
MRSA, PCR: POSITIVE — AB
Staphylococcus aureus: POSITIVE — AB

## 2022-09-03 LAB — GLUCOSE, CAPILLARY: Glucose-Capillary: 97 mg/dL (ref 70–99)

## 2022-09-03 SURGERY — ARTHROPLASTY, KNEE, TOTAL
Anesthesia: Spinal | Site: Knee | Laterality: Left

## 2022-09-03 MED ORDER — ACETAMINOPHEN 500 MG PO TABS
1000.0000 mg | ORAL_TABLET | Freq: Four times a day (QID) | ORAL | Status: AC
  Administered 2022-09-03 – 2022-09-04 (×4): 1000 mg via ORAL
  Filled 2022-09-03 (×4): qty 2

## 2022-09-03 MED ORDER — KCL IN DEXTROSE-NACL 20-5-0.45 MEQ/L-%-% IV SOLN
INTRAVENOUS | Status: DC
  Filled 2022-09-03 (×2): qty 1000

## 2022-09-03 MED ORDER — CEFAZOLIN SODIUM-DEXTROSE 2-4 GM/100ML-% IV SOLN
2.0000 g | INTRAVENOUS | Status: AC
  Administered 2022-09-03: 2 g via INTRAVENOUS
  Filled 2022-09-03: qty 100

## 2022-09-03 MED ORDER — DOCUSATE SODIUM 100 MG PO CAPS
100.0000 mg | ORAL_CAPSULE | Freq: Two times a day (BID) | ORAL | Status: DC
  Administered 2022-09-03 – 2022-09-04 (×2): 100 mg via ORAL
  Filled 2022-09-03 (×2): qty 1

## 2022-09-03 MED ORDER — HYDROMORPHONE HCL 1 MG/ML IJ SOLN: 0.2500 mg | INTRAMUSCULAR | Status: DC | PRN

## 2022-09-03 MED ORDER — ASPIRIN 81 MG PO TBEC: 81.0000 mg | DELAYED_RELEASE_TABLET | Freq: Two times a day (BID) | ORAL | 0 refills | Status: DC

## 2022-09-03 MED ORDER — BUPIVACAINE LIPOSOME 1.3 % IJ SUSP
INTRAMUSCULAR | Status: DC | PRN
  Administered 2022-09-03: 20 mL

## 2022-09-03 MED ORDER — HYDRALAZINE HCL 25 MG PO TABS
12.5000 mg | ORAL_TABLET | Freq: Four times a day (QID) | ORAL | Status: DC
  Administered 2022-09-03 – 2022-09-04 (×4): 12.5 mg via ORAL
  Filled 2022-09-03 (×4): qty 1

## 2022-09-03 MED ORDER — METOCLOPRAMIDE HCL 5 MG PO TABS: 5.0000 mg | ORAL_TABLET | Freq: Three times a day (TID) | ORAL | Status: DC | PRN

## 2022-09-03 MED ORDER — WATER FOR IRRIGATION, STERILE IR SOLN
Status: DC | PRN
  Administered 2022-09-03: 2000 mL

## 2022-09-03 MED ORDER — TIZANIDINE HCL 2 MG PO TABS: 2.0000 mg | ORAL_TABLET | Freq: Four times a day (QID) | ORAL | 0 refills | Status: DC | PRN

## 2022-09-03 MED ORDER — ATENOLOL 50 MG PO TABS
50.0000 mg | ORAL_TABLET | Freq: Every day | ORAL | Status: DC
  Administered 2022-09-04: 50 mg via ORAL
  Filled 2022-09-03: qty 1

## 2022-09-03 MED ORDER — BISACODYL 5 MG PO TBEC: 5.0000 mg | DELAYED_RELEASE_TABLET | Freq: Every day | ORAL | Status: DC | PRN

## 2022-09-03 MED ORDER — BUPIVACAINE LIPOSOME 1.3 % IJ SUSP
INTRAMUSCULAR | Status: AC
  Filled 2022-09-03: qty 20

## 2022-09-03 MED ORDER — POVIDONE-IODINE 10 % EX SWAB: 2.0000 | Freq: Once | CUTANEOUS | Status: DC

## 2022-09-03 MED ORDER — CHLORHEXIDINE GLUCONATE 0.12 % MT SOLN
15.0000 mL | Freq: Once | OROMUCOSAL | Status: AC
  Administered 2022-09-03: 15 mL via OROMUCOSAL

## 2022-09-03 MED ORDER — TRANEXAMIC ACID-NACL 1000-0.7 MG/100ML-% IV SOLN
1000.0000 mg | Freq: Once | INTRAVENOUS | Status: AC
  Administered 2022-09-03: 1000 mg via INTRAVENOUS
  Filled 2022-09-03: qty 100

## 2022-09-03 MED ORDER — PHENYLEPHRINE HCL (PRESSORS) 10 MG/ML IV SOLN
INTRAVENOUS | Status: AC
  Filled 2022-09-03: qty 1

## 2022-09-03 MED ORDER — TRANEXAMIC ACID 1000 MG/10ML IV SOLN
INTRAVENOUS | Status: DC | PRN
  Administered 2022-09-03: 2000 mg via TOPICAL

## 2022-09-03 MED ORDER — DIPHENHYDRAMINE HCL 12.5 MG/5ML PO ELIX: 12.5000 mg | ORAL_SOLUTION | ORAL | Status: DC | PRN

## 2022-09-03 MED ORDER — LACTATED RINGERS IV SOLN: INTRAVENOUS | Status: DC

## 2022-09-03 MED ORDER — ROPIVACAINE HCL 5 MG/ML IJ SOLN
INTRAMUSCULAR | Status: DC | PRN
  Administered 2022-09-03: 20 mL via PERINEURAL

## 2022-09-03 MED ORDER — DEXAMETHASONE SODIUM PHOSPHATE 10 MG/ML IJ SOLN
INTRAMUSCULAR | Status: DC | PRN
  Administered 2022-09-03: 10 mg via INTRAVENOUS

## 2022-09-03 MED ORDER — PHENOL 1.4 % MT LIQD: 1.0000 | OROMUCOSAL | Status: DC | PRN

## 2022-09-03 MED ORDER — BUPIVACAINE LIPOSOME 1.3 % IJ SUSP: 20.0000 mL | Freq: Once | INTRAMUSCULAR | Status: DC

## 2022-09-03 MED ORDER — PHENYLEPHRINE HCL-NACL 20-0.9 MG/250ML-% IV SOLN
INTRAVENOUS | Status: DC | PRN
  Administered 2022-09-03: 30 ug/min via INTRAVENOUS

## 2022-09-03 MED ORDER — BUPIVACAINE-EPINEPHRINE (PF) 0.5% -1:200000 IJ SOLN
INTRAMUSCULAR | Status: AC
  Filled 2022-09-03: qty 30

## 2022-09-03 MED ORDER — TRANEXAMIC ACID 1000 MG/10ML IV SOLN
2000.0000 mg | INTRAVENOUS | Status: DC
  Filled 2022-09-03: qty 20

## 2022-09-03 MED ORDER — EZETIMIBE 10 MG PO TABS
10.0000 mg | ORAL_TABLET | Freq: Every day | ORAL | Status: DC
  Administered 2022-09-04: 10 mg via ORAL
  Filled 2022-09-03: qty 1

## 2022-09-03 MED ORDER — OXYCODONE HCL 5 MG PO TABS
5.0000 mg | ORAL_TABLET | ORAL | Status: DC | PRN
  Administered 2022-09-03 – 2022-09-04 (×2): 10 mg via ORAL
  Filled 2022-09-03: qty 2

## 2022-09-03 MED ORDER — FUROSEMIDE 20 MG PO TABS
20.0000 mg | ORAL_TABLET | Freq: Every day | ORAL | Status: DC
  Administered 2022-09-04: 20 mg via ORAL
  Filled 2022-09-03: qty 1

## 2022-09-03 MED ORDER — BUPIVACAINE IN DEXTROSE 0.75-8.25 % IT SOLN
INTRATHECAL | Status: DC | PRN
  Administered 2022-09-03: 1.6 mL via INTRATHECAL

## 2022-09-03 MED ORDER — ASPIRIN 81 MG PO CHEW
81.0000 mg | CHEWABLE_TABLET | Freq: Two times a day (BID) | ORAL | Status: DC
  Administered 2022-09-03 – 2022-09-04 (×2): 81 mg via ORAL
  Filled 2022-09-03 (×2): qty 1

## 2022-09-03 MED ORDER — PANTOPRAZOLE SODIUM 40 MG PO TBEC
40.0000 mg | DELAYED_RELEASE_TABLET | Freq: Every day | ORAL | Status: DC
  Administered 2022-09-04: 40 mg via ORAL
  Filled 2022-09-03: qty 1

## 2022-09-03 MED ORDER — PROPOFOL 500 MG/50ML IV EMUL
INTRAVENOUS | Status: DC | PRN
  Administered 2022-09-03: 75 ug/kg/min via INTRAVENOUS

## 2022-09-03 MED ORDER — CLONIDINE HCL 0.1 MG PO TABS
0.2000 mg | ORAL_TABLET | Freq: Two times a day (BID) | ORAL | Status: DC
  Administered 2022-09-03 – 2022-09-04 (×2): 0.2 mg via ORAL
  Filled 2022-09-03 (×2): qty 2

## 2022-09-03 MED ORDER — ALPRAZOLAM 0.5 MG PO TABS
0.5000 mg | ORAL_TABLET | Freq: Two times a day (BID) | ORAL | Status: DC | PRN
  Administered 2022-09-04: 0.5 mg via ORAL
  Filled 2022-09-03: qty 1

## 2022-09-03 MED ORDER — OXYCODONE HCL 5 MG PO TABS
10.0000 mg | ORAL_TABLET | ORAL | Status: DC | PRN
  Filled 2022-09-03: qty 2

## 2022-09-03 MED ORDER — AMISULPRIDE (ANTIEMETIC) 5 MG/2ML IV SOLN: 10.0000 mg | Freq: Once | INTRAVENOUS | Status: DC | PRN

## 2022-09-03 MED ORDER — ACETAMINOPHEN 500 MG PO TABS
1000.0000 mg | ORAL_TABLET | Freq: Once | ORAL | Status: AC
  Administered 2022-09-03: 500 mg via ORAL
  Filled 2022-09-03: qty 2

## 2022-09-03 MED ORDER — OXYCODONE HCL 5 MG/5ML PO SOLN: 5.0000 mg | Freq: Once | ORAL | Status: DC | PRN

## 2022-09-03 MED ORDER — HYDROMORPHONE HCL 1 MG/ML IJ SOLN
0.5000 mg | INTRAMUSCULAR | Status: DC | PRN
  Administered 2022-09-03: 1 mg via INTRAVENOUS
  Filled 2022-09-03: qty 1

## 2022-09-03 MED ORDER — ONDANSETRON HCL 4 MG/2ML IJ SOLN
INTRAMUSCULAR | Status: DC | PRN
  Administered 2022-09-03: 4 mg via INTRAVENOUS

## 2022-09-03 MED ORDER — METOCLOPRAMIDE HCL 5 MG/ML IJ SOLN: 5.0000 mg | Freq: Three times a day (TID) | INTRAMUSCULAR | Status: DC | PRN

## 2022-09-03 MED ORDER — METHOCARBAMOL 500 MG IVPB - SIMPLE MED: 500.0000 mg | Freq: Four times a day (QID) | INTRAVENOUS | Status: DC | PRN

## 2022-09-03 MED ORDER — OXYCODONE-ACETAMINOPHEN 5-325 MG PO TABS: 1.0000 | ORAL_TABLET | ORAL | 0 refills | Status: DC | PRN

## 2022-09-03 MED ORDER — TRANEXAMIC ACID-NACL 1000-0.7 MG/100ML-% IV SOLN
1000.0000 mg | INTRAVENOUS | Status: AC
  Administered 2022-09-03: 1000 mg via INTRAVENOUS
  Filled 2022-09-03: qty 100

## 2022-09-03 MED ORDER — MENTHOL 3 MG MT LOZG: 1.0000 | LOZENGE | OROMUCOSAL | Status: DC | PRN

## 2022-09-03 MED ORDER — CARVEDILOL 25 MG PO TABS
25.0000 mg | ORAL_TABLET | Freq: Two times a day (BID) | ORAL | Status: DC
  Administered 2022-09-03 – 2022-09-04 (×2): 25 mg via ORAL
  Filled 2022-09-03 (×2): qty 1

## 2022-09-03 MED ORDER — METHOCARBAMOL 500 MG PO TABS
500.0000 mg | ORAL_TABLET | Freq: Four times a day (QID) | ORAL | Status: DC | PRN
  Administered 2022-09-03 – 2022-09-04 (×2): 500 mg via ORAL
  Filled 2022-09-03 (×2): qty 1

## 2022-09-03 MED ORDER — FENTANYL CITRATE PF 50 MCG/ML IJ SOSY
50.0000 ug | PREFILLED_SYRINGE | INTRAMUSCULAR | Status: DC
  Administered 2022-09-03: 25 ug via INTRAVENOUS
  Filled 2022-09-03: qty 2

## 2022-09-03 MED ORDER — GABAPENTIN 300 MG PO CAPS
300.0000 mg | ORAL_CAPSULE | Freq: Every day | ORAL | Status: DC
  Administered 2022-09-04: 300 mg via ORAL
  Filled 2022-09-03: qty 1

## 2022-09-03 MED ORDER — ALBUTEROL SULFATE (2.5 MG/3ML) 0.083% IN NEBU: 2.5000 mg | INHALATION_SOLUTION | Freq: Four times a day (QID) | RESPIRATORY_TRACT | Status: DC | PRN

## 2022-09-03 MED ORDER — PROPOFOL 10 MG/ML IV BOLUS
INTRAVENOUS | Status: DC | PRN
  Administered 2022-09-03: 20 mg via INTRAVENOUS

## 2022-09-03 MED ORDER — ONDANSETRON HCL 4 MG/2ML IJ SOLN: 4.0000 mg | Freq: Four times a day (QID) | INTRAMUSCULAR | Status: DC | PRN

## 2022-09-03 MED ORDER — ONDANSETRON HCL 4 MG PO TABS: 4.0000 mg | ORAL_TABLET | Freq: Four times a day (QID) | ORAL | Status: DC | PRN

## 2022-09-03 MED ORDER — PHENYLEPHRINE 80 MCG/ML (10ML) SYRINGE FOR IV PUSH (FOR BLOOD PRESSURE SUPPORT)
PREFILLED_SYRINGE | INTRAVENOUS | Status: DC | PRN
  Administered 2022-09-03 (×2): 80 ug via INTRAVENOUS

## 2022-09-03 MED ORDER — BUDESONIDE 0.25 MG/2ML IN SUSP
0.2500 mg | Freq: Two times a day (BID) | RESPIRATORY_TRACT | Status: DC
  Administered 2022-09-03 – 2022-09-04 (×2): 0.25 mg via RESPIRATORY_TRACT
  Filled 2022-09-03 (×2): qty 2

## 2022-09-03 MED ORDER — POLYETHYLENE GLYCOL 3350 17 G PO PACK
17.0000 g | PACK | Freq: Every day | ORAL | Status: DC | PRN
  Administered 2022-09-03: 17 g via ORAL
  Filled 2022-09-03: qty 1

## 2022-09-03 MED ORDER — 0.9 % SODIUM CHLORIDE (POUR BTL) OPTIME
TOPICAL | Status: DC | PRN
  Administered 2022-09-03: 1000 mL

## 2022-09-03 MED ORDER — BUSPIRONE HCL 5 MG PO TABS
5.0000 mg | ORAL_TABLET | Freq: Two times a day (BID) | ORAL | Status: DC
  Administered 2022-09-03 – 2022-09-04 (×2): 5 mg via ORAL
  Filled 2022-09-03 (×2): qty 1

## 2022-09-03 MED ORDER — SODIUM CHLORIDE (PF) 0.9 % IJ SOLN
INTRAMUSCULAR | Status: DC | PRN
  Administered 2022-09-03: 70 mL

## 2022-09-03 MED ORDER — SODIUM CHLORIDE (PF) 0.9 % IJ SOLN
INTRAMUSCULAR | Status: AC
  Filled 2022-09-03: qty 100

## 2022-09-03 MED ORDER — ALBUTEROL SULFATE HFA 108 (90 BASE) MCG/ACT IN AERS: 2.0000 | INHALATION_SPRAY | Freq: Four times a day (QID) | RESPIRATORY_TRACT | Status: DC | PRN

## 2022-09-03 MED ORDER — ORAL CARE MOUTH RINSE: 15.0000 mL | Freq: Once | OROMUCOSAL | Status: AC

## 2022-09-03 MED ORDER — ACETAMINOPHEN 325 MG PO TABS: 325.0000 mg | ORAL_TABLET | Freq: Four times a day (QID) | ORAL | Status: DC | PRN

## 2022-09-03 MED ORDER — OXYCODONE HCL 5 MG PO TABS: 5.0000 mg | ORAL_TABLET | Freq: Once | ORAL | Status: DC | PRN

## 2022-09-03 MED ORDER — FLEET ENEMA 7-19 GM/118ML RE ENEM: 1.0000 | ENEMA | Freq: Once | RECTAL | Status: DC | PRN

## 2022-09-03 MED ORDER — FAMOTIDINE 20 MG PO TABS
20.0000 mg | ORAL_TABLET | Freq: Two times a day (BID) | ORAL | Status: DC
  Administered 2022-09-03 – 2022-09-04 (×2): 20 mg via ORAL
  Filled 2022-09-03 (×2): qty 1

## 2022-09-03 MED ORDER — BUPIVACAINE-EPINEPHRINE (PF) 0.5% -1:200000 IJ SOLN
INTRAMUSCULAR | Status: DC | PRN
  Administered 2022-09-03: 20 mL

## 2022-09-03 MED ORDER — DEXAMETHASONE SODIUM PHOSPHATE 10 MG/ML IJ SOLN
10.0000 mg | Freq: Once | INTRAMUSCULAR | Status: AC
  Administered 2022-09-04: 10 mg via INTRAVENOUS
  Filled 2022-09-03: qty 1

## 2022-09-03 MED ORDER — ALUM & MAG HYDROXIDE-SIMETH 200-200-20 MG/5ML PO SUSP: 30.0000 mL | ORAL | Status: DC | PRN

## 2022-09-03 SURGICAL SUPPLY — 53 items
ATTUNE PS FEM LT SZ 5 CEM KNEE (Femur) IMPLANT
ATTUNE PSRP INSR SZ5 5 KNEE (Insert) IMPLANT
BAG COUNTER SPONGE SURGICOUNT (BAG) IMPLANT
BAG DECANTER FOR FLEXI CONT (MISCELLANEOUS) ×1 IMPLANT
BAG SPEC THK2 15X12 ZIP CLS (MISCELLANEOUS) ×1
BAG SPNG CNTER NS LX DISP (BAG)
BAG ZIPLOCK 12X15 (MISCELLANEOUS) ×1 IMPLANT
BASE TIBIAL ROT PLAT SZ 5 KNEE (Knees) IMPLANT
BLADE SAG 18X100X1.27 (BLADE) ×1 IMPLANT
BLADE SAW SGTL 11.0X1.19X90.0M (BLADE) ×1 IMPLANT
BLADE SURG SZ10 CARB STEEL (BLADE) ×2 IMPLANT
BNDG CMPR MED 10X6 ELC LF (GAUZE/BANDAGES/DRESSINGS) ×1
BNDG ELASTIC 6X10 VLCR STRL LF (GAUZE/BANDAGES/DRESSINGS) ×1 IMPLANT
BOWL SMART MIX CTS (DISPOSABLE) ×1 IMPLANT
BSPLAT TIB 5 CMNT ROT PLAT STR (Knees) ×1 IMPLANT
CEMENT HV SMART SET (Cement) ×2 IMPLANT
COVER SURGICAL LIGHT HANDLE (MISCELLANEOUS) ×1 IMPLANT
DRAPE INCISE IOBAN 66X45 STRL (DRAPES) ×1 IMPLANT
DRAPE U-SHAPE 47X51 STRL (DRAPES) ×1 IMPLANT
DRSG AQUACEL AG ADV 3.5X10 (GAUZE/BANDAGES/DRESSINGS) ×1 IMPLANT
DURAPREP 26ML APPLICATOR (WOUND CARE) ×1 IMPLANT
ELECT REM PT RETURN 15FT ADLT (MISCELLANEOUS) ×1 IMPLANT
GLOVE BIO SURGEON STRL SZ7.5 (GLOVE) ×1 IMPLANT
GLOVE BIO SURGEON STRL SZ8.5 (GLOVE) ×1 IMPLANT
GLOVE BIOGEL PI IND STRL 8 (GLOVE) ×1 IMPLANT
GLOVE BIOGEL PI IND STRL 9 (GLOVE) ×1 IMPLANT
GOWN STRL REUS W/ TWL XL LVL3 (GOWN DISPOSABLE) ×2 IMPLANT
GOWN STRL REUS W/TWL XL LVL3 (GOWN DISPOSABLE) ×2
HANDPIECE INTERPULSE COAX TIP (DISPOSABLE) ×1
HOOD PEEL AWAY T7 (MISCELLANEOUS) ×3 IMPLANT
IMMOBILIZER KNEE 20 (SOFTGOODS) ×1
IMMOBILIZER KNEE 20 THIGH 36 (SOFTGOODS) IMPLANT
KIT TURNOVER KIT A (KITS) IMPLANT
NDL HYPO 21X1.5 SAFETY (NEEDLE) ×2 IMPLANT
NEEDLE HYPO 21X1.5 SAFETY (NEEDLE) ×2 IMPLANT
NS IRRIG 1000ML POUR BTL (IV SOLUTION) ×1 IMPLANT
PACK TOTAL KNEE CUSTOM (KITS) ×1 IMPLANT
PATELLA MEDIAL ATTUN 35MM KNEE (Knees) IMPLANT
PIN STEINMAN FIXATION KNEE (PIN) IMPLANT
PROTECTOR NERVE ULNAR (MISCELLANEOUS) ×1 IMPLANT
SET HNDPC FAN SPRY TIP SCT (DISPOSABLE) ×1 IMPLANT
SPIKE FLUID TRANSFER (MISCELLANEOUS) ×3 IMPLANT
SUT VIC AB 1 CTX 36 (SUTURE) ×1
SUT VIC AB 1 CTX36XBRD ANBCTR (SUTURE) ×1 IMPLANT
SUT VIC AB 3-0 CT1 27 (SUTURE) ×2
SUT VIC AB 3-0 CT1 TAPERPNT 27 (SUTURE) ×3 IMPLANT
SYR CONTROL 10ML LL (SYRINGE) ×2 IMPLANT
TIBIAL BASE ROT PLAT SZ 5 KNEE (Knees) ×1 IMPLANT
TRAY CATH INTERMITTENT SS 16FR (CATHETERS) IMPLANT
TRAY FOLEY MTR SLVR 16FR STAT (SET/KITS/TRAYS/PACK) ×1 IMPLANT
TUBE SUCTION HIGH CAP CLEAR NV (SUCTIONS) ×1 IMPLANT
WATER STERILE IRR 1000ML POUR (IV SOLUTION) ×2 IMPLANT
WRAP KNEE MAXI GEL POST OP (GAUZE/BANDAGES/DRESSINGS) ×1 IMPLANT

## 2022-09-03 NOTE — Interval H&P Note (Signed)
History and Physical Interval Note:  09/03/2022 10:40 AM  [name]  has presented today for surgery, with the diagnosis of LEFT KNEE OSTEOARTHRITIS.  The various methods of treatment have been discussed with the patient and family. After consideration of risks, benefits and other options for treatment, the patient has consented to  Procedure(s): LEFT TOTAL KNEE ARTHROPLASTY (Left) as a surgical intervention.  The patient's history has been reviewed, patient examined, no change in status, stable for surgery.  I have reviewed the patient's chart and labs.  Questions were answered to the patient's satisfaction.     Kerin Salen

## 2022-09-03 NOTE — Progress Notes (Signed)
Orthopedic Tech Progress Note Patient Details:  [name] 1932-03-19 [id number]  Ortho Devices Type of Ortho Device: Bone foam zero knee Ortho Device/Splint Location: LLE Ortho Device/Splint Interventions: Application   Post Interventions Patient Tolerated: Well  [name] [place] 09/03/2022, 5:03 PM

## 2022-09-03 NOTE — Anesthesia Procedure Notes (Signed)
Anesthesia Regional Block: Adductor canal block   Pre-Anesthetic Checklist: , timeout performed,  Correct Patient, Correct Site, Correct Laterality,  Correct Procedure, Correct Position, site marked,  Risks and benefits discussed,  Surgical consent,  Pre-op evaluation,  At surgeon's request and post-op pain management  Laterality: Left  Prep: chloraprep       Needles:  Injection technique: Single-shot  Needle Type: Echogenic Stimulator Needle     Needle Length: 9cm  Needle Gauge: 21     Additional Needles:   Procedures:,,,, ultrasound used (permanent image in chart),,    Narrative:  Start time: 09/03/2022 12:48 PM End time: 09/03/2022 12:50 PM Injection made incrementally with aspirations every 5 mL.  Performed by: Personally  Anesthesiologist: Nilda Simmer, MD  Additional Notes: Discussed risks and benefits of nerve block including, but not limited to, prolonged and/or permanent nerve injury involving sensory and/or motor function. Monitors were applied and a time-out was performed. The nerve and associated structures were visualized under ultrasound guidance. After negative aspiration, local anesthetic was slowly injected around the nerve. There was no evidence of high pressure during the procedure. There were no paresthesias. VSS remained stable and the patient tolerated the procedure well.

## 2022-09-03 NOTE — Discharge Instructions (Signed)

## 2022-09-03 NOTE — Anesthesia Procedure Notes (Signed)
Spinal  Patient location during procedure: OR Start time: 09/03/2022 2:08 PM End time: 09/03/2022 2:11 PM Reason for block: surgical anesthesia Staffing Performed: resident/CRNA  Resident/CRNA: Genelle Bal, CRNA Performed by: Genelle Bal, CRNA Authorized by: Belinda Block, MD   Preanesthetic Checklist Completed: patient identified, IV checked, site marked, risks and benefits discussed, surgical consent, monitors and equipment checked, pre-op evaluation and timeout performed Spinal Block Patient position: sitting Prep: DuraPrep Patient monitoring: heart rate, cardiac monitor, continuous pulse ox and blood pressure Approach: midline Location: L3-4 Injection technique: single-shot Needle Needle type: Sprotte  Needle gauge: 24 G Needle length: 9 cm Assessment Sensory level: T4 Events: CSF return Additional Notes Consent, allergies, working IV verified. Patient in sitting position. Spinal attempt x1, + clear, free-flowing CSF, easy to aspirate/inject, - paresthesia/heme.

## 2022-09-03 NOTE — Anesthesia Postprocedure Evaluation (Signed)
Anesthesia Post Note  Patient: [name]  Procedure(s) Performed: LEFT TOTAL KNEE ARTHROPLASTY (Left: Knee)     Patient location during evaluation: PACU Anesthesia Type: Spinal Level of consciousness: awake Pain management: pain level controlled Respiratory status: spontaneous breathing Cardiovascular status: stable Postop Assessment: no apparent nausea or vomiting Anesthetic complications: no   No notable events documented.  Last Vitals:  Vitals:   09/03/22 1317 09/03/22 1322  BP: (!) 207/89   Pulse: 67 66  Resp: (!) 9 (!) 8  Temp:    SpO2: 100% 100%    Last Pain:  Vitals:   09/03/22 1055  TempSrc:   PainSc: 0-No pain                 Lukis Bunt

## 2022-09-03 NOTE — Transfer of Care (Signed)
Immediate Anesthesia Transfer of Care Note  Patient: [name]  Procedure(s) Performed: LEFT TOTAL KNEE ARTHROPLASTY (Left: Knee)  Patient Location: PACU  Anesthesia Type:Spinal  Level of Consciousness: awake, alert , and oriented  Airway & Oxygen Therapy: Patient Spontanous Breathing and Patient connected to face mask oxygen  Post-op Assessment: Report given to RN and Post -op Vital signs reviewed and stable  Post vital signs: Reviewed and stable  Last Vitals:  Vitals Value Taken Time  BP 157/71   Temp    Pulse 80 09/03/22 1627  Resp 13 09/03/22 1627  SpO2 100 % 09/03/22 1627  Vitals shown include unvalidated device data.  Last Pain:  Vitals:   09/03/22 1055  TempSrc:   PainSc: 0-No pain      Patients Stated Pain Goal: 5 (88/28/00 3491)  Complications: No notable events documented.

## 2022-09-03 NOTE — Op Note (Signed)
PATIENT ID:      [name]  MRN:     [id number] DOB/AGE:    86-Jan-1933 / 86 y.o.       OPERATIVE REPORT   DATE OF PROCEDURE:  09/03/2022      PREOPERATIVE DIAGNOSIS:   LEFT KNEE OSTEOARTHRITIS      Estimated body mass index is 30.51 kg/m as calculated from the following:   Height as of 08/31/22: 5' 3.5" (1.613 m).   Weight as of 08/31/22: 79.4 kg.                                                       POSTOPERATIVE DIAGNOSIS:   Same                                                                  PROCEDURE:  Procedure(s): LEFT TOTAL KNEE ARTHROPLASTY Using DepuyAttune RP implants #5L Femur, #5Tibia, 5 mm Attune RP bearing, 35 Patella    SURGEON: Kerin Salen  ASSISTANT:   Kerry Hough. [employer]   (Present and scrubbed throughout the case, critical for assistance with exposure, retraction, instrumentation, and closure.)        ANESTHESIA: Spinal, 20cc Exparel, 50cc 0.25% Marcaine EBL: 300 cc FLUID REPLACEMENT: 1500 cc crystaloid TOURNIQUET: DRAINS: None TRANEXAMIC ACID: 1gm IV, 2gm topical COMPLICATIONS:  None         INDICATIONS FOR PROCEDURE: The patient has  LEFT KNEE OSTEOARTHRITIS, Var deformities, XR shows bone on bone arthritis, lateral subluxation of tibia. Patient has failed all conservative measures including anti-inflammatory medicines, narcotics, attempts at exercise and weight loss, cortisone injections and viscosupplementation.  Risks and benefits of surgery have been discussed, questions answered.   DESCRIPTION OF PROCEDURE: The patient identified by armband, received  IV antibiotics, in the holding area at [facility]. Patient taken to the operating room, appropriate anesthetic monitors were attached, and Spinal anesthesia was  induced. IV Tranexamic acid was given.Tourniquet applied high to the operative thigh. Lateral post and foot positioner applied to the table, the lower extremity was then prepped and draped in usual sterile fashion from the toes to the  tourniquet. Time-out procedure was performed. Kerry Hough. [facility], was present and scrubbed throughout the case, critical for assistance with, positioning, exposure, retraction, instrumentation, and closure.The skin and subcutaneous tissue along the incision was injected with 20 cc of a mixture of Exparel and Marcaine solution, using a 20-gauge by 1-1/2 inch needle. We began the operation, with the knee flexed 130 degrees, by making the anterior midline incision starting at handbreadth above the patella going over the patella 1 cm medial to and 4 cm distal to the tibial tubercle. Small bleeders in the skin and the subcutaneous tissue identified and cauterized. Transverse retinaculum was incised and reflected medially and a medial parapatellar arthrotomy was accomplished. the patella was everted and theprepatellar fat pad resected. The superficial medial collateral ligament was then elevated from anterior to posterior along the proximal flare of the tibia and anterior half of the menisci resected. The knee was hyperflexed exposing bone on bone arthritis. Peripheral and notch  osteophytes as well as the cruciate ligaments were then resected. We continued to work our way around posteriorly along the proximal tibia, and externally rotated the tibia subluxing it out from underneath the femur. A McHale PCL retractor was placed through the notch and a lateral Hohmann retractor placed, and we then entered the proximal tibia in line with the Depuy starter drill in line with the axis of the tibia followed by an intramedullary guide rod and 0-degree posterior slope cutting guide. The tibial cutting guide, 4 degree posterior sloped, was pinned into place allowing resection of 0 mm of bone medially and 10 mm of bone laterally. Satisfied with the tibial resection, we then entered the distal femur 2 mm anterior to the PCL origin with the intramedullary guide rod and applied the distal femoral cutting guide set at 9 mm, with 5  degrees of valgus. This was pinned along the epicondylar axis. At this point, the distal femoral cut was accomplished without difficulty. We then sized for a #5R femoral component and pinned the guide in 3 degrees of external rotation. The chamfer cutting guide was pinned into place. The anterior, posterior, and chamfer cuts were accomplished without difficulty followed by the Attune RP box cutting guide and the box cut. We also removed posterior osteophytes from the posterior femoral condyles. The posterior capsule was injected with Exparel solution. The knee was brought into full extension. We checked our extension gap and fit a 5 mm bearing. Distracting in extension with a lamina spreader,  bleeders in the posterior capsule, Posterior medial and posterior lateral gutter were cauterized.  The transexamic acid-soaked sponge was then placed in the gap of the knee in extension. The knee was flexed 30. The posterior patella cut was accomplished with the 9.5 mm Attune cutting guide, sized for a 34m dome, and the fixation pegs drilled.The knee was then once again hyperflexed exposing the proximal tibia. We sized for a # 5 tibial base plate, applied the smokestack and the conical reamer followed by the the Delta fin keel punch. We then hammered into place the Attune RP trial femoral component, drilled the lugs, inserted a  5 mm trial bearing, trial patellar button, and took the knee through range of motion from 0-130 degrees. Medial and lateral ligamentous stability was checked. No thumb pressure was required for patellar Tracking. The tourniquet was not used. All trial components were removed, mating surfaces irrigated with pulse lavage, and dried with suction and sponges. 10 cc of the Exparel solution was applied to the cancellus bone of the patella distal femur and proximal tibia.  After waiting 30 seconds, the bony surfaces were again, dried with sponges. A double batch of DePuy HV cement was mixed and applied to  all bony metallic mating surfaces except for the posterior condyles of the femur itself. In order, we hammered into place the tibial tray and removed excess cement, the femoral component and removed excess cement. The final Attune RP bearing was inserted, and the knee brought to full extension with compression. The patellar button was clamped into place, and excess cement removed. The knee was held at 30 flexion with compression, while the cement cured. The wound was irrigated out with normal saline solution pulse lavage. The rest of the Exparel was injected into the parapatellar arthrotomy, subcutaneous tissues, and periosteal tissues. The parapatellar arthrotomy was closed with running #1 Vicryl suture. The subcutaneous tissue with 3-0 undyed Vicryl suture, and the skin with running 3-0 SQ vicryl. An Aquacil and Ace wrap  were applied. The patient was taken to recovery room without difficulty.   Kerin Salen 09/03/2022, 10:40 AM

## 2022-09-04 DIAGNOSIS — M1712 Unilateral primary osteoarthritis, left knee: Secondary | ICD-10-CM | POA: Diagnosis not present

## 2022-09-04 NOTE — TOC Transition Note (Signed)
Transition of Care [facility] - CM/SW Discharge Note  Patient Details  Name: [name] MRN: [id number] Date of Birth: 1931/11/18  [facility] CM/SW Contact:  Sherie Don, LCSW Phone Number: 09/04/2022, 10:12 AM  Clinical Narrative: Patient is expected to discharge home after working with PT. CSW met with patient and her daughter, [name], to confirm discharge plan and needs. Patient will return home with HHPT, which was prearranged with [place]. Patient will need a rolling walker. Kaitlyn with [employer] to deliver walker to patient's room. TOC signing off.    Final next level of care: [place] Barriers to Discharge: No Barriers Identified  Patient Goals and CMS Choice Patient states their goals for this hospitalization and ongoing recovery are:: Discharge home with [place] CMS Medicare.gov Compare Post Acute Care list provided to:: Patient Choice offered to / list presented to : Patient  Discharge Plan and Services        DME Arranged: Walker rolling DME Agency: [employer] Date DME Agency Contacted: 09/04/22 Representative spoke with at DME Agency: Verline Lema  Social Determinants of Health ([place]) Interventions    Readmission Risk Interventions     No data to display

## 2022-09-04 NOTE — Progress Notes (Signed)
Son and daughter-in-law Jolayne Panther arrive to take patient home via private vehicle. Discharge instructions provided to patient and family. Ivan Anchors, RN 09/04/22 4:13 PM

## 2022-09-04 NOTE — Discharge Summary (Signed)
Patient ID: [name] MRN: [id number] DOB/AGE: [birth year] 88 y.o.  Admit date: 09/03/2022 Discharge date: 09/04/2022  Admission Diagnoses:  Principal Problem:   S/P total knee arthroplasty, left Active Problems:   Degenerative arthritis of left knee   Discharge Diagnoses:  Same  Past Medical History:  Diagnosis Date   Anxiety    Arthritis    Asthma    Cancer ([place])    SKIN CA OF FACE   Chronic kidney disease    Chronic pain    Depression    Diabetes mellitus without complication ([place])    Gallstones    GERD (gastroesophageal reflux disease)    Hypertension    Insomnia    Pneumonia    Prosthetic joint infection ([place])     Surgeries: Procedure(s): LEFT TOTAL KNEE ARTHROPLASTY on 09/03/2022   Consultants:   Discharged Condition: Improved  Hospital Course: [name] is an 88 y.o. female who was admitted 09/03/2022 for operative treatment ofS/P total knee arthroplasty, left. Patient has severe unremitting pain that affects sleep, daily activities, and work/hobbies. After pre-op clearance the patient was taken to the operating room on 09/03/2022 and underwent  Procedure(s): LEFT TOTAL KNEE ARTHROPLASTY.    Patient was given perioperative antibiotics:  Anti-infectives (From admission, onward)    Start     Dose/Rate Route Frequency Ordered Stop   09/03/22 1030  ceFAZolin (ANCEF) IVPB 2g/100 mL premix        2 g 200 mL/hr over 30 Minutes Intravenous On call to O.R. 09/03/22 1022 09/03/22 1445        Patient was given sequential compression devices, early ambulation, and chemoprophylaxis to prevent DVT.  Patient benefited maximally from hospital stay and there were no complications.    Recent vital signs: Patient Vitals for the past 24 hrs:  BP Temp Temp src Pulse Resp SpO2 Height Weight  09/04/22 0518 135/73 97.9 F (36.6 C) Oral 79 15 97 % -- --  09/04/22 0201 (!) 169/72 98.3 F (36.8 C) Oral 91 17 96 % -- --  09/03/22 2300 (!) 173/81 98.9 F (37.2  C) Oral 88 18 97 % -- --  09/03/22 2106 (!) 160/96 -- -- 98 18 -- -- --  09/03/22 2045 -- -- -- -- -- 96 % -- --  09/03/22 1902 (!) 163/110 97.7 F (36.5 C) -- (!) 103 14 96 % -- --  09/03/22 1745 (!) 187/82 (!) 97.5 F (36.4 C) -- 68 17 100 % -- --  09/03/22 1730 (!) 192/80 -- -- 65 18 100 % -- --  09/03/22 1715 (!) 201/121 -- -- 65 12 100 % -- --  09/03/22 1708 (!) 191/88 -- -- 64 10 100 % -- --  09/03/22 1700 (!) 180/85 -- -- 71 16 100 % -- --  09/03/22 1649 (!) 161/93 -- -- 66 12 100 % -- --  09/03/22 1645 (!) 138/103 -- -- 66 18 100 % -- --  09/03/22 1630 (!) 157/71 97.6 F (36.4 C) -- 77 12 100 % -- --  09/03/22 1322 -- -- -- 66 (!) 8 100 % -- --  09/03/22 1317 (!) 207/89 -- -- 67 (!) 9 100 % -- --  09/03/22 1312 -- -- -- 65 10 99 % -- --  09/03/22 1307 -- -- -- 67 (!) 7 100 % -- --  09/03/22 1302 (!) 204/72 -- -- 66 (!) 8 99 % -- --  09/03/22 1257 -- -- -- 66 10 99 % -- --  09/03/22 1254 -- -- --  72 11 100 % -- --  09/03/22 1252 (!) 209/78 -- -- 70 10 100 % -- --  09/03/22 1247 (!) 220/100 -- -- 91 -- 97 % -- --  09/03/22 1245 (!) 190/168 -- -- 83 13 97 % -- --  09/03/22 1055 -- -- -- -- -- -- 5' 5.5" (1.664 m) 79.4 kg  09/03/22 1047 (!) 156/68 98 F (36.7 C) Oral 79 15 97 % -- --     Recent laboratory studies:  Recent Labs    09/03/22 1057  HGB 13.6  HCT 40.0  NA 138  K 4.8  CL 107  BUN 24*  CREATININE 1.20*  GLUCOSE 123*     Discharge Medications:   Allergies as of 09/04/2022       Reactions   Lisinopril Swelling   angioedema   Hydrochlorothiazide Other (See Comments)   UNSPECIFIED REACTION    Lipitor [atorvastatin Calcium] Other (See Comments)   UNSPECIFIED REACTION    Thiazide-type Diuretics Other (See Comments)   UNSPECIFIED REACTION    Zocor [simvastatin] Swelling   SWELLING REACTION UNSPECIFIED    Codeine Nausea Only        Medication List     TAKE these medications    albuterol 108 (90 Base) MCG/ACT inhaler Commonly known as:  VENTOLIN HFA Inhale 2 puffs into the lungs every 6 (six) hours as needed for shortness of breath or wheezing.   ALPRAZolam 0.5 MG tablet Commonly known as: XANAX Take 0.5 mg by mouth 2 (two) times daily as needed for anxiety.   ascorbic acid 500 MG tablet Commonly known as: VITAMIN C Take 500 mg by mouth daily.   aspirin EC 81 MG tablet Take 1 tablet (81 mg total) by mouth 2 (two) times daily.   atenolol 50 MG tablet Commonly known as: TENORMIN Take 1.5 tablets (75 mg total) by mouth daily. What changed: how much to take   busPIRone 5 MG tablet Commonly known as: BUSPAR Take 5 mg by mouth 2 (two) times daily.   carvedilol 25 MG tablet Commonly known as: COREG Take 25 mg by mouth 2 (two) times daily.   cloNIDine 0.2 MG tablet Commonly known as: CATAPRES Take 1 tablet (0.2 mg total) by mouth 2 (two) times daily.   diclofenac Sodium 1 % Gel Commonly known as: VOLTAREN Apply 2 g topically 4 (four) times daily as needed for pain.   ezetimibe 10 MG tablet Commonly known as: ZETIA Take 10 mg by mouth daily.   famotidine 20 MG tablet Commonly known as: PEPCID Take 20 mg by mouth 2 (two) times daily.   fluticasone 110 MCG/ACT inhaler Commonly known as: FLOVENT HFA Inhale 1 puff into the lungs daily as needed (asthma).   furosemide 40 MG tablet Commonly known as: LASIX Take 1 tablet (40 mg total) by mouth daily. What changed: how much to take   gabapentin 300 MG capsule Commonly known as: NEURONTIN Take 1 capsule (300 mg total) by mouth 3 (three) times daily. What changed: when to take this   hydrALAZINE 25 MG tablet Commonly known as: APRESOLINE Take 12.5 mg by mouth 4 (four) times daily.   HYDROcodone-acetaminophen 10-325 MG tablet Commonly known as: NORCO Take 1 tablet by mouth in the morning and at bedtime. What changed: Another medication with the same name was removed. Continue taking this medication, and follow the directions you see here.   Multivitamin  Womens 50+ Adv Tabs Take 1 tablet by mouth daily.   naloxone 4 MG/0.1ML Liqd  nasal spray kit Commonly known as: NARCAN   oxyCODONE-acetaminophen 5-325 MG tablet Commonly known as: PERCOCET/ROXICET Take 1 tablet by mouth every 4 (four) hours as needed for severe pain.   pantoprazole 40 MG tablet Commonly known as: PROTONIX Take 40 mg by mouth daily.   SUPER B COMPLEX PO Take 1 tablet by mouth daily.   tiZANidine 2 MG tablet Commonly known as: ZANAFLEX Take 1 tablet (2 mg total) by mouth every 6 (six) hours as needed for muscle spasms.   zolpidem 5 MG tablet Commonly known as: AMBIEN Take 5 mg by mouth at bedtime.               Durable Medical Equipment  (From admission, onward)           Start     Ordered   09/03/22 1826  DME Walker rolling  Once       Question:  Patient needs a walker to treat with the following condition  Answer:  Status post total left knee replacement   09/03/22 1825   09/03/22 1826  DME 3 n 1  Once        09/03/22 1825            Diagnostic Studies: No results found.  Disposition: Discharge disposition: 01-Home or Self Care       Discharge Instructions     Call MD / Call 911   Complete by: As directed    If you experience chest pain or shortness of breath, CALL 911 and be transported to the hospital emergency room.  If you develope a fever above 101 F, pus (white drainage) or increased drainage or redness at the wound, or calf pain, call your surgeon's office.   Constipation Prevention   Complete by: As directed    Drink plenty of fluids.  Prune juice may be helpful.  You may use a stool softener, such as Colace (over the counter) 100 mg twice a day.  Use MiraLax (over the counter) for constipation as needed.   Diet - low sodium heart healthy   Complete by: As directed    Increase activity slowly as tolerated   Complete by: As directed    Post-operative opioid taper instructions:   Complete by: As directed     POST-OPERATIVE OPIOID TAPER INSTRUCTIONS: It is important to wean off of your opioid medication as soon as possible. If you do not need pain medication after your surgery it is ok to stop day one. Opioids include: Codeine, Hydrocodone(Norco, Vicodin), Oxycodone(Percocet, oxycontin) and hydromorphone amongst others.  Long term and even short term use of opiods can cause: Increased pain response Dependence Constipation Depression Respiratory depression And more.  Withdrawal symptoms can include Flu like symptoms Nausea, vomiting And more Techniques to manage these symptoms Hydrate well Eat regular healthy meals Stay active Use relaxation techniques(deep breathing, meditating, yoga) Do Not substitute Alcohol to help with tapering If you have been on opioids for less than two weeks and do not have pain than it is ok to stop all together.  Plan to wean off of opioids This plan should start within one week post op of your joint replacement. Maintain the same interval or time between taking each dose and first decrease the dose.  Cut the total daily intake of opioids by one tablet each day Next start to increase the time between doses. The last dose that should be eliminated is the evening dose.  Follow-up Information     Frederik Pear, MD Follow up in 2 week(s).   Specialty: Orthopedic Surgery Contact information: [place] [address] [phone]                  Signed: Kerin Salen 09/04/2022, 7:50 AM

## 2022-09-04 NOTE — Progress Notes (Signed)
PATIENT ID: [name]  MRN: [id number]  DOB/AGE:  01/02/1932 / 88 y.o.  1 Day Post-Op Procedure(s) (LRB): LEFT TOTAL KNEE ARTHROPLASTY (Left)    PROGRESS NOTE Subjective: Patient is alert, oriented, no Nausea, no Vomiting, yes passing gas. Taking PO well. Denies SOB, Chest or Calf Pain. Using Incentive Spirometer, PAS in place. Ambulate WBAT, Patient reports pain as 2/10 .    Objective: Vital signs in last 24 hours: Vitals:   09/03/22 2106 09/03/22 2300 09/04/22 0201 09/04/22 0518  BP: (!) 160/96 (!) 173/81 (!) 169/72 135/73  Pulse: 98 88 91 79  Resp: '18 18 17 15  '$ Temp:  98.9 F (37.2 C) 98.3 F (36.8 C) 97.9 F (36.6 C)  TempSrc:  Oral Oral Oral  SpO2:  97% 96% 97%  Weight:      Height:          Intake/Output from previous day: I/O last 3 completed shifts: In: 2760 [P.O.:460; I.V.:2100; IV Piggyback:200] Out: 1194 [Urine:1525; Blood:100]   Intake/Output this shift: No intake/output data recorded.   LABORATORY DATA: Recent Labs    09/03/22 1057 09/03/22 1635  HGB 13.6  --   HCT 40.0  --   NA 138  --   K 4.8  --   CL 107  --   BUN 24*  --   CREATININE 1.20*  --   GLUCOSE 123*  --   GLUCAP  --  97    Examination: Neurologically intact ABD soft Neurovascular intact Sensation intact distally Intact pulses distally Dorsiflexion/Plantar flexion intact Incision: dressing C/D/I No cellulitis present Compartment soft}  Assessment:   1 Day Post-Op Procedure(s) (LRB): LEFT TOTAL KNEE ARTHROPLASTY (Left) ADDITIONAL DIAGNOSIS: Expected Acute Blood Loss Anemia, Hypertension,Chronic leg ulcers now healed  Patient's anticipated LOS is less than 2 midnights, meeting these requirements: - Younger than 74 - Lives within 1 hour of care - Has a competent adult at home to recover with post-op recover - NO history of  - Chronic pain requiring opiods  - Diabetes  - Coronary Artery Disease  - Heart failure  - Heart attack  - Stroke  - DVT/VTE  - Cardiac  arrhythmia  - Respiratory Failure/COPD  - Renal failure  - Anemia  - Advanced Liver disease     Plan: PT/OT WBAT, AROM and PROM  DVT Prophylaxis:  SCDx72hrs, ASA 81 mg BID x 2 weeks DISCHARGE PLAN: Home, When she passes physical therapy probably today DISCHARGE NEEDS: HHPT, Walker, and 3-in-1 comode seat     Kerin Salen 09/04/2022, 7:46 AM Patient ID: [name], female   DOB: January 03, 1932, 88 y.o.   MRN: [id number]

## 2022-09-04 NOTE — Plan of Care (Signed)
Problem: Activity: Goal: Ability to avoid complications of mobility impairment will improve Outcome: Progressing   Problem: Clinical Measurements: Goal: Postoperative complications will be avoided or minimized Outcome: Progressing   Problem: Pain Management: Goal: Pain level will decrease with appropriate interventions Outcome: [place], RN 09/04/22 8:59 AM

## 2022-09-04 NOTE — Evaluation (Signed)
Physical Therapy Evaluation Patient Details Name: [name] MRN: [id number] DOB: [birth year] Today's Date: 09/04/2022  History of Present Illness  88 yo female s/p LTKA 09/03/22. Hx of R rev TSA 2020, chronic pain, R THA 2019, R TKA  Clinical Impression  On eval, pt was Min guard A for mobility. She walked ~120 feet with a RW. Moderate pain with activity. Will plan to have a 2nd session prior to possible d/c home if pt meets her PT goals.        Recommendations for follow up therapy are one component of a multi-disciplinary discharge planning process, led by the attending physician.  Recommendations may be updated based on patient status, additional functional criteria and insurance authorization.  Follow Up Recommendations Follow physician's recommendations for discharge plan and follow up therapies      Assistance Recommended at Discharge Frequent or constant Supervision/Assistance  Patient can return home with the following  A little help with walking and/or transfers;A little help with bathing/dressing/bathroom;Assistance with cooking/housework;Assist for transportation;Help with stairs or ramp for entrance    Equipment Recommendations Rolling walker (2 wheels)  Recommendations for Other Services       Functional Status Assessment Patient has had a recent decline in their functional status and demonstrates the ability to make significant improvements in function in a reasonable and predictable amount of time.     Precautions / Restrictions Precautions Precautions: Fall Restrictions Weight Bearing Restrictions: No LLE Weight Bearing: Weight bearing as tolerated      Mobility  Bed Mobility Overal bed mobility: Needs Assistance Bed Mobility: Supine to Sit     Supine to sit: Supervision     General bed mobility comments: Increased time. Cues for safety    Transfers Overall transfer level: Needs assistance Equipment used: Rolling walker (2 wheels) Transfers:  Sit to/from Stand Sit to Stand: Min guard           General transfer comment: Min guard A for safety. Cues for safety, hand placement. Increased time    Ambulation/Gait Ambulation/Gait assistance: Min guard Gait Distance (Feet): 120 Feet Assistive device: Rolling walker (2 wheels) Gait Pattern/deviations: Step-through pattern, Decreased stride length       General Gait Details: Min guard A for safety. Slow gait speed. No LOB with RW use.  Stairs            Wheelchair Mobility    Modified Rankin (Stroke Patients Only)       Balance Overall balance assessment: Needs assistance         Standing balance support: Reliant on assistive device for balance, Bilateral upper extremity supported, During functional activity Standing balance-Leahy Scale: Fair                               Pertinent Vitals/Pain Pain Assessment Pain Assessment: 0-10 Pain Score: 5  Pain Location: L knee Pain Descriptors / Indicators: Discomfort, Sore Pain Intervention(s): Monitored during session, Ice applied, Repositioned    Home Living Family/patient expects to be discharged to:: Private residence Living Arrangements: Children Available Help at Discharge: Family Type of Home: Mobile home Home Access: Stairs to enter Entrance Stairs-Rails: Chemical engineer of Steps: 4   Home Layout: One level Home Equipment: None (pt reports walker was broken)      Prior Function Prior Level of Function : Independent/Modified Independent  Hand Dominance        Extremity/Trunk Assessment   Upper Extremity Assessment Upper Extremity Assessment: Overall WFL for tasks assessed    Lower Extremity Assessment Lower Extremity Assessment: Generalized weakness    Cervical / Trunk Assessment Cervical / Trunk Assessment: Normal  Communication   Communication: No difficulties  Cognition Arousal/Alertness: Awake/alert Behavior During  Therapy: WFL for tasks assessed/performed Overall Cognitive Status: Within Functional Limits for tasks assessed                                          General Comments      Exercises Total Joint Exercises Ankle Circles/Pumps: AROM, 10 reps, Both Quad Sets: AROM, Both, 10 reps Hip ABduction/ADduction: AROM, Left, 10 reps Straight Leg Raises: AROM, Left, 10 reps Goniometric ROM: ~10-65 degrees   Assessment/Plan    PT Assessment Patient needs continued PT services  PT Problem List Decreased strength;Decreased range of motion;Decreased activity tolerance;Decreased balance;Decreased mobility;Pain       PT Treatment Interventions DME instruction;Gait training;Therapeutic exercise;Balance training;Stair training;Functional mobility training;Therapeutic activities;Patient/family education    PT Goals (Current goals can be found in the Care Plan section)  Acute Rehab PT Goals Patient Stated Goal: regain PLOF/independence PT Goal Formulation: With patient Time For Goal Achievement: 09/18/22 Potential to Achieve Goals: Good    Frequency 7X/week     Co-evaluation               AM-PAC PT "6 Clicks" Mobility  Outcome Measure Help needed turning from your back to your side while in a flat bed without using bedrails?: A Little Help needed moving from lying on your back to sitting on the side of a flat bed without using bedrails?: A Little Help needed moving to and from a bed to a chair (including a wheelchair)?: A Little Help needed standing up from a chair using your arms (e.g., wheelchair or bedside chair)?: A Little Help needed to walk in hospital room?: A Little Help needed climbing 3-5 steps with a railing? : A Little 6 Click Score: 18    End of Session Equipment Utilized During Treatment: Gait belt Activity Tolerance: Patient tolerated treatment well Patient left: in chair;with call bell/phone within reach;with family/visitor present   PT Visit  Diagnosis: Other abnormalities of gait and mobility (R26.89);Pain Pain - Right/Left: Left Pain - part of body: Knee    Time: 0955-1010 PT Time Calculation (min) (ACUTE ONLY): 15 min   Charges:   PT Evaluation $PT Eval Low Complexity: [place], PT Acute Rehabilitation  Office: [phone]

## 2022-09-04 NOTE — Progress Notes (Signed)
Physical Therapy Treatment Patient Details Name: [name] MRN: [id number] DOB: August 30, 1932 Today's Date: 09/04/2022   History of Present Illness 88 yo female s/p LTKA 09/03/22. Hx of R rev TSA 2020, chronic pain, R THA 2019, R TKA    PT Comments    2nd session to continue gait and stair training. Encouraged pt to ambulate often at home as tolerated. All PT education completed.    Recommendations for follow up therapy are one component of a multi-disciplinary discharge planning process, led by the attending physician.  Recommendations may be updated based on patient status, additional functional criteria and insurance authorization.  Follow Up Recommendations  Follow physician's recommendations for discharge plan and follow up therapies     Assistance Recommended at Discharge Frequent or constant Supervision/Assistance  Patient can return home with the following A little help with walking and/or transfers;A little help with bathing/dressing/bathroom;Assistance with cooking/housework;Assist for transportation;Help with stairs or ramp for entrance   Equipment Recommendations  Rolling walker (2 wheels)    Recommendations for Other Services       Precautions / Restrictions Precautions Precautions: Fall Restrictions Weight Bearing Restrictions: No LLE Weight Bearing: Weight bearing as tolerated     Mobility  Bed Mobility Overal bed mobility: Needs Assistance Bed Mobility: Supine to Sit          General bed mobility comments: oob in recliner    Transfers Overall transfer level: Needs assistance Equipment used: Rolling walker (2 wheels) Transfers: Sit to/from Stand Sit to Stand: Min guard           General transfer comment: Min guard A for safety. Cues for safety, hand placement. Increased time    Ambulation/Gait Ambulation/Gait assistance: Min guard Gait Distance (Feet): 120 Feet Assistive device: Rolling walker (2 wheels) Gait Pattern/deviations:  Step-through pattern, Decreased stride length       General Gait Details: Min guard A for safety. Slow gait speed. No LOB with RW use.   Stairs Stairs: Yes Stairs assistance: Min guard Stair Management: Step to pattern, Forwards, Two rails Number of Stairs: 5 General stair comments: up and over portable stairs x 2. cues for safety, technique, sequence.   Wheelchair Mobility    Modified Rankin (Stroke Patients Only)       Balance Overall balance assessment: Needs assistance         Standing balance support: Reliant on assistive device for balance, Bilateral upper extremity supported, During functional activity Standing balance-Leahy Scale: Fair                              Cognition Arousal/Alertness: Awake/alert Behavior During Therapy: WFL for tasks assessed/performed Overall Cognitive Status: Within Functional Limits for tasks assessed                                          Exercises     General Comments        Pertinent Vitals/Pain Pain Assessment Pain Assessment: Faces Pain Score: 5  Faces Pain Scale: Hurts even more Pain Location: L knee Pain Descriptors / Indicators: Discomfort, Sore Pain Intervention(s): Limited activity within patient's tolerance, Monitored during session, Repositioned    Home Living                 Prior Function            PT Goals (  current goals can now be found in the care plan section) Acute Rehab PT Goals Patient Stated Goal: regain PLOF/independence PT Goal Formulation: With patient Time For Goal Achievement: 09/18/22 Potential to Achieve Goals: Good Progress towards PT goals: Progressing toward goals    Frequency    7X/week      PT Plan Current plan remains appropriate    Co-evaluation              AM-PAC PT "6 Clicks" Mobility   Outcome Measure  Help needed turning from your back to your side while in a flat bed without using bedrails?: A Little Help  needed moving from lying on your back to sitting on the side of a flat bed without using bedrails?: A Little Help needed moving to and from a bed to a chair (including a wheelchair)?: A Little Help needed standing up from a chair using your arms (e.g., wheelchair or bedside chair)?: A Little Help needed to walk in hospital room?: A Little Help needed climbing 3-5 steps with a railing? : A Little 6 Click Score: 18    End of Session Equipment Utilized During Treatment: Gait belt Activity Tolerance: Patient tolerated treatment well Patient left: in chair;with call bell/phone within reach;with family/visitor present   PT Visit Diagnosis: Other abnormalities of gait and mobility (R26.89);Pain Pain - Right/Left: Left Pain - part of body: Knee     Time: 2035-5974 PT Time Calculation (min) (ACUTE ONLY): 10 min  Charges:  $Gait Training: 8-22 mins                         Doreatha Massed, PT Acute Rehabilitation  Office: [phone]

## 2022-09-05 ENCOUNTER — Encounter (HOSPITAL_COMMUNITY): Payer: Self-pay | Admitting: Orthopedic Surgery

## 2022-09-11 ENCOUNTER — Other Ambulatory Visit: Payer: Self-pay | Admitting: Orthopedic Surgery

## 2022-09-12 ENCOUNTER — Encounter (HOSPITAL_COMMUNITY): Payer: Self-pay | Admitting: Orthopedic Surgery

## 2022-09-12 NOTE — Progress Notes (Signed)
For Anesthesia: PCP - Curly Rim, MD  Cardiologist - Marina Goodell, MD  Nephrologist-Crossley, Jerene Pitch, MD    Chest x-ray - 03/30/22 in [facility] EKG - 03/29/22 in [facility] Stress Test - 05/06/20 in [facility] ECHO - 02/21/22 in [facility] Cardiac Cath -  Pacemaker/ICD device last checked: Pacemaker orders received: Device Rep notified:  Spinal Cord Stimulator:  Sleep Study -  CPAP -   Fasting Blood Sugar -  Checks Blood Sugar _____ times a day Date and result of last Hgb A1c- 08/31/22 6.0  Last dose of GLP1 agonist-  GLP1 instructions:   Last dose of SGLT-2 inhibitors-  SGLT-2 instructions:  Blood Thinner Instructions: Aspirin Instructions: Last Dose:  Activity level: Can go up a flight of stairs and activities of daily living without stopping and without chest pain and/or shortness of breath   Able to exercise without chest pain and/or shortness of breath   Unable to go up a flight of stairs without chest pain and/or shortness of breath     Anesthesia review:   Patient denies shortness of breath, fever, cough and chest pain at PAT appointment   Patient verbalized understanding of instructions reviewed via telephone.

## 2022-09-13 NOTE — Progress Notes (Signed)
Attempted to complete pre-op phone call. Called patient home and cell at 0800, 0930, and 1510 with no answer. Not able to leave voicemail on home phone but left message on cell phone.

## 2022-09-18 MED ORDER — REGADENOSON 0.4 MG/5ML IV SOLN
INTRAVENOUS | Status: AC
  Filled 2022-09-18: qty 5

## 2022-09-18 NOTE — Progress Notes (Signed)
  For Anesthesia: PCP - Curly Rim, MD  Cardiologist - Marina Goodell, MD  Nephrologist-Crossley, Jerene Pitch, MD     Chest x-ray - 03/30/22 in [facility] EKG - 03/29/22 in [facility] Stress Test - 05/06/20 in [facility] ECHO - 02/21/22 in [facility] Cardiac Cath -  Pacemaker/ICD device last checked: Pacemaker orders received: Device Rep notified:   Spinal Cord Stimulator:   Sleep Study -  CPAP -    Fasting Blood Sugar -  Checks Blood Sugar - does not check  Date and result of last Hgb A1c- 08/31/22 6.0   Last dose of GLP1 agonist-  GLP1 instructions:    Last dose of SGLT-2 inhibitors-  SGLT-2 instructions:   Blood Thinner Instructions: Aspirin Instructions:  81 mg to stay on  Last Dose:   Activity level: Currently having trouble climbing stairs due to knee injury.  Able to perform activities of daily living without stopping and without chest pain and/or shortness of breath                                               Anesthesia review:  Eval by cardiology for abnormal EKG and RBBB, DM, HTN, CKD   Patient denies shortness of breath, fever, cough and chest pain at PAT appointment (completed over the phone)     Patient verbalized understanding of instructions reviewed via telephone.

## 2022-09-18 NOTE — Progress Notes (Signed)
Per patient's request, spoke to patient's daughter-in-law [name] regarding upcoming surgery.  [name] was advised to have patient at the hospital at 1015 on 09-20-22.  She was advised for patient to not eat solid food after midnight the night before surgery, and that she can have clear liquids from midnight until 0930.  She stated that there has been no change in patient's medical history since last surgery.  [name] will be bringing patient day of surgery and taking her home afterwards.  All questions answered and she stated understanding.

## 2022-09-19 ENCOUNTER — Encounter (HOSPITAL_COMMUNITY): Payer: Self-pay | Admitting: Orthopedic Surgery

## 2022-09-19 DIAGNOSIS — M978XXA Periprosthetic fracture around other internal prosthetic joint, initial encounter: Secondary | ICD-10-CM

## 2022-09-19 DIAGNOSIS — Z96659 Presence of unspecified artificial knee joint: Secondary | ICD-10-CM

## 2022-09-19 HISTORY — DX: Periprosthetic fracture around other internal prosthetic joint, initial encounter: M97.8XXA

## 2022-09-19 NOTE — H&P (Signed)
[name] is an 88 y.o. female.   Chief Complaint: Left knee Pain  HPI: [name] is here today with her daughter-in-law for follow-up of left knee after TKA on 09/03/22.  Her daughter-in-law reports that she did get a call from her sister-in-law whom is staying with [name] at this time on 09/05/22 in the early morning stating that she had fallen but was able to get her back up into the bed.  She had some discomfort in the knee but this was controlled with hydrocodone twice a day.  While at physical therapy yesterday there was some concern about introducing lesion on her left lower leg.  Patient reports she is doing very well.  She has been walking around with her walker even this morning.  She denies any fevers, chills or worsening of her pain.  Past Medical History:  Diagnosis Date   Anxiety    Arthritis    Asthma    Cancer ([place])    SKIN CA OF FACE   Chronic kidney disease    Chronic pain    Depression    Diabetes mellitus without complication ([place])    Gallstones    GERD (gastroesophageal reflux disease)    Hyperlipidemia    Hypertension    Insomnia    Iron deficiency anemia    Peri-prosthetic patellar fracture 09/19/2022   Pneumonia    Prosthetic joint infection ([place])    RBBB 03/29/2022   TIA (transient ischemic attack)     Past Surgical History:  Procedure Laterality Date    ARM FRACTURE     LEFT  and right   CHOLECYSTECTOMY     DILATION AND CURETTAGE OF UTERUS     IRRIGATION AND DEBRIDEMENT KNEE  09/12/2011   Procedure: IRRIGATION AND DEBRIDEMENT KNEE;  Surgeon: Kerin Salen;  Location: [facility];  Service: Orthopedics;  Laterality: Right;  I&D RIGHT TKA REVISE BEARINGS/MBT. Poly exchange   REPLACEMENT TOTAL KNEE     right   TOTAL HIP ARTHROPLASTY Left    TOTAL HIP ARTHROPLASTY Right 01/20/2018   Procedure: TOTAL HIP ARTHROPLASTY WITH REMOVAL OF INTRAMEDULLARY NAIL;  Surgeon: Frederik Pear, MD;  Location: [facility];  Service: Orthopedics;  Laterality: Right;    TOTAL KNEE ARTHROPLASTY Left 09/03/2022   Procedure: LEFT TOTAL KNEE ARTHROPLASTY;  Surgeon: Frederik Pear, MD;  Location: [facility];  Service: Orthopedics;  Laterality: Left;   TOTAL SHOULDER ARTHROPLASTY Right 10/02/2018   Procedure: REVERSE TOTAL SHOULDER;  Surgeon: Tania Ade, MD;  Location: [facility];  Service: Orthopedics;  Laterality: Right;    Family History  Problem Relation Age of Onset   Diabetes Mellitus II Daughter    Social History:  reports that she quit smoking about 44 years ago. Her smoking use included cigarettes. She has a 2.50 pack-year smoking history. She has never used smokeless tobacco. She reports that she does not drink alcohol and does not use drugs.  Allergies:  Allergies  Allergen Reactions   Lisinopril Swelling    angioedema   Hydrochlorothiazide Other (See Comments)    UNSPECIFIED REACTION    Lipitor [Atorvastatin Calcium] Other (See Comments)    UNSPECIFIED REACTION    Thiazide-Type Diuretics Other (See Comments)    UNSPECIFIED REACTION    Zocor [Simvastatin] Swelling    SWELLING REACTION UNSPECIFIED    Codeine Nausea Only    No medications prior to admission.    No results found for this or any previous visit (from the past 48 hour(s)). No results found.  Review of Systems  Constitutional:  Positive for diaphoresis and fatigue.  HENT: Negative.    Eyes: Negative.   Cardiovascular:  Positive for leg swelling.  Gastrointestinal:  Positive for nausea.  Endocrine: Negative.   Genitourinary: Negative.   Musculoskeletal:  Positive for arthralgias and myalgias.  Psychiatric/Behavioral:  The patient is nervous/anxious.     There were no vitals taken for this visit. Physical Exam Constitutional:      Appearance: Normal appearance. She is normal weight.  HENT:     Head: Normocephalic and atraumatic.     Nose: Nose normal.  Eyes:     Pupils: Pupils are equal, round, and reactive to light.  Cardiovascular:     Pulses: Normal pulses.   Pulmonary:     Effort: Pulmonary effort is normal.  Musculoskeletal:        General: Tenderness present.     Cervical back: Normal range of motion and neck supple.     Comments: Well approximated anterior knee surgical scar.  She has some diffuse ecchymosis of any edema.  She has some pitting edema in her lower leg.  Chronic venous stasis changes.  There is a 1 well healed scabbed lesion with no drainage.  Approximately 1 cm superior to that there is a small abrasion, no discharge from the area today.  Patient has flexion about 90.  Difficulty with extension.  Unable to perform straight leg raise.  Skin:    General: Skin is warm and dry.  Neurological:     General: No focal deficit present.     Mental Status: She is alert and oriented to person, place, and time. Mental status is at baseline.  Psychiatric:        Mood and Affect: Mood normal.        Behavior: Behavior normal.        Thought Content: Thought content normal.        Judgment: Judgment normal.      Assessment/Plan Assessment: 1.  Left TKA on 09/03/22 2.  Left knee transverse, displaced inferior patellar pole fracture  Plan: We will wait approximately 1 week prior to taking her back to surgery allowing time for some of her swelling and edema to resolve.  She is to remain in a knee immobilizer until surgical intervention.  We will see her back in the operating room for this patella fracture.  Hold on physical therapy at this time.  She may resume after surgical intervention.  Joanell Rising, PA-C 09/19/2022, 8:51 AM

## 2022-09-20 ENCOUNTER — Encounter (HOSPITAL_COMMUNITY): Payer: Self-pay | Admitting: Orthopedic Surgery

## 2022-09-20 ENCOUNTER — Other Ambulatory Visit: Payer: Self-pay

## 2022-09-20 DIAGNOSIS — E119 Type 2 diabetes mellitus without complications: Secondary | ICD-10-CM

## 2022-09-20 DIAGNOSIS — D649 Anemia, unspecified: Secondary | ICD-10-CM

## 2022-09-20 NOTE — Progress Notes (Signed)
Spoke with pt's daughter-in-law, Joseph Art for pre-op call. She states pt has hx of HTN and Diabetes. Denies cardiac history. Pt's last A1C was 6.0 on 08/31/22. Pt is on no medications for Diabetes and they do not check her blood sugar at home.   Shower instructions reviewed with Renee. She states they have CHG soap from previous surgery and will use it in the AM.

## 2022-09-20 NOTE — Anesthesia Preprocedure Evaluation (Addendum)
Anesthesia Evaluation  Patient identified by MRN, date of birth, ID band Patient awake    Reviewed: Allergy & Precautions, H&P , NPO status , Patient's Chart, lab work & pertinent test results  History of Anesthesia Complications Negative for: history of anesthetic complications  Airway Mallampati: III  TM Distance: >3 FB Neck ROM: Full    Dental no notable dental hx. (+) Upper Dentures, Lower Dentures   Pulmonary asthma (no recent inhaler use) , neg sleep apnea, neg COPD, neg recent URI, former smoker   Pulmonary exam normal breath sounds clear to auscultation       Cardiovascular hypertension, Pt. on home beta blockers and Pt. on medications (-) angina (-) Past MI, (-) Cardiac Stents and (-) CABG Normal cardiovascular exam+ dysrhythmias  Rhythm:Regular Rate:Normal  HLD  Recent cardiac echo from May, 2023 - normal LV systolic function with EF 55-60%,  normal LV wall motion,  Moderate LV diastolic dysfunction,  no significant valvular disease,  no pulmonary hypertension.  Nuclear stress test from Aug, 2021 revealed no evidence of ischemia,  doubtful for 2 tiny scars, normal LV systolic function with LF EF 66%.  Normal wall motion.  It was low risk stress test.   Holter monitor from April 16, 2019 was overall unremarkable. Min HR 51, max HR 109 and average 65 bpm.    No significant tightness noted.    About 39% of total time patient was bradycardic but bradycardia was only mild.     Neuro/Psych  PSYCHIATRIC DISORDERS Anxiety Depression    Chronic pain TIA   GI/Hepatic Neg liver ROS,GERD  ,,  Endo/Other  diabetes, Well Controlled, Type 2    Renal/GU Renal InsufficiencyRenal diseaseLab Results      Component                Value               Date                      CREATININE               1.20 (H)            09/03/2022                    K                        4.8                 09/03/2022                   negative genitourinary   Musculoskeletal negative musculoskeletal ROS (+) Arthritis ,    Abdominal   Peds negative pediatric ROS (+)  Hematology negative hematology ROS (+) Lab Results      Component                Value               Date                            HGB                      13.6                09/03/2022  HCT                      40.0                09/03/2022                    PLT                      170                 08/31/2022              Anesthesia Other Findings All: lisinopril, codeine, zocor, lipitor, HCTZ  Reproductive/Obstetrics negative OB ROS                             Anesthesia Physical Anesthesia Plan  ASA: 3  Anesthesia Plan: Spinal   Post-op Pain Management: Regional block* and Tylenol PO (pre-op)*   Induction: Intravenous  PONV Risk Score and Plan: 2 and Ondansetron, Propofol infusion and Treatment may vary due to age or medical condition  Airway Management Planned: Natural Airway and Nasal Cannula  Additional Equipment: None  Intra-op Plan:   Post-operative Plan:   Informed Consent:      Dental advisory given  Plan Discussed with: CRNA and Surgeon  Anesthesia Plan Comments: (Spinal )        Anesthesia Quick Evaluation

## 2022-09-21 ENCOUNTER — Encounter (HOSPITAL_COMMUNITY): Payer: Self-pay | Admitting: Orthopedic Surgery

## 2022-09-21 ENCOUNTER — Inpatient Hospital Stay (HOSPITAL_COMMUNITY): Payer: Medicare Other

## 2022-09-21 ENCOUNTER — Encounter (HOSPITAL_COMMUNITY)
Admission: AD | Disposition: A | Discharge status: Skilled Nursing Facility | Payer: Self-pay | Source: Ambulatory Visit | Attending: Orthopedic Surgery

## 2022-09-21 ENCOUNTER — Ambulatory Visit (HOSPITAL_COMMUNITY): Payer: Medicare Other | Admitting: Physician Assistant

## 2022-09-21 ENCOUNTER — Inpatient Hospital Stay (HOSPITAL_COMMUNITY)
Admission: AD | Admit: 2022-09-21 | Discharge: 2022-09-25 | DRG: 501 | Disposition: A | Discharge status: Skilled Nursing Facility | Payer: Medicare Other | Source: Ambulatory Visit | Attending: Orthopedic Surgery | Admitting: Orthopedic Surgery

## 2022-09-21 ENCOUNTER — Other Ambulatory Visit: Payer: Self-pay

## 2022-09-21 DIAGNOSIS — Z96659 Presence of unspecified artificial knee joint: Secondary | ICD-10-CM

## 2022-09-21 DIAGNOSIS — S82092D Other fracture of left patella, subsequent encounter for closed fracture with routine healing: Secondary | ICD-10-CM | POA: Diagnosis not present

## 2022-09-21 DIAGNOSIS — Z87891 Personal history of nicotine dependence: Secondary | ICD-10-CM | POA: Diagnosis not present

## 2022-09-21 DIAGNOSIS — Z96643 Presence of artificial hip joint, bilateral: Secondary | ICD-10-CM | POA: Diagnosis present

## 2022-09-21 DIAGNOSIS — Z85828 Personal history of other malignant neoplasm of skin: Secondary | ICD-10-CM

## 2022-09-21 DIAGNOSIS — F419 Anxiety disorder, unspecified: Secondary | ICD-10-CM | POA: Diagnosis present

## 2022-09-21 DIAGNOSIS — I10 Essential (primary) hypertension: Secondary | ICD-10-CM | POA: Diagnosis present

## 2022-09-21 DIAGNOSIS — Z888 Allergy status to other drugs, medicaments and biological substances status: Secondary | ICD-10-CM

## 2022-09-21 DIAGNOSIS — Y929 Unspecified place or not applicable: Secondary | ICD-10-CM

## 2022-09-21 DIAGNOSIS — S83412D Sprain of medial collateral ligament of left knee, subsequent encounter: Secondary | ICD-10-CM | POA: Diagnosis not present

## 2022-09-21 DIAGNOSIS — E119 Type 2 diabetes mellitus without complications: Secondary | ICD-10-CM | POA: Diagnosis present

## 2022-09-21 DIAGNOSIS — Z96653 Presence of artificial knee joint, bilateral: Secondary | ICD-10-CM | POA: Diagnosis present

## 2022-09-21 DIAGNOSIS — Z01818 Encounter for other preprocedural examination: Secondary | ICD-10-CM

## 2022-09-21 DIAGNOSIS — Z885 Allergy status to narcotic agent status: Secondary | ICD-10-CM

## 2022-09-21 DIAGNOSIS — Z96611 Presence of right artificial shoulder joint: Secondary | ICD-10-CM | POA: Diagnosis present

## 2022-09-21 DIAGNOSIS — W19XXXA Unspecified fall, initial encounter: Secondary | ICD-10-CM | POA: Diagnosis present

## 2022-09-21 DIAGNOSIS — S82002A Unspecified fracture of left patella, initial encounter for closed fracture: Principal | ICD-10-CM | POA: Diagnosis present

## 2022-09-21 DIAGNOSIS — K219 Gastro-esophageal reflux disease without esophagitis: Secondary | ICD-10-CM | POA: Diagnosis present

## 2022-09-21 DIAGNOSIS — D649 Anemia, unspecified: Secondary | ICD-10-CM

## 2022-09-21 DIAGNOSIS — M978XXA Periprosthetic fracture around other internal prosthetic joint, initial encounter: Secondary | ICD-10-CM | POA: Diagnosis present

## 2022-09-21 DIAGNOSIS — Z8673 Personal history of transient ischemic attack (TIA), and cerebral infarction without residual deficits: Secondary | ICD-10-CM | POA: Diagnosis not present

## 2022-09-21 DIAGNOSIS — M9712XA Periprosthetic fracture around internal prosthetic left knee joint, initial encounter: Secondary | ICD-10-CM | POA: Diagnosis present

## 2022-09-21 DIAGNOSIS — G4700 Insomnia, unspecified: Secondary | ICD-10-CM | POA: Diagnosis present

## 2022-09-21 DIAGNOSIS — F418 Other specified anxiety disorders: Secondary | ICD-10-CM

## 2022-09-21 DIAGNOSIS — Z833 Family history of diabetes mellitus: Secondary | ICD-10-CM

## 2022-09-21 DIAGNOSIS — E785 Hyperlipidemia, unspecified: Secondary | ICD-10-CM | POA: Diagnosis present

## 2022-09-21 HISTORY — DX: COVID-19: U07.1

## 2022-09-21 HISTORY — DX: Iron deficiency anemia, unspecified: D50.9

## 2022-09-21 HISTORY — PX: PATELLAR TENDON REPAIR: SHX737

## 2022-09-21 HISTORY — DX: Hyperlipidemia, unspecified: E78.5

## 2022-09-21 LAB — GLUCOSE, CAPILLARY
Glucose-Capillary: 118 mg/dL — ABNORMAL HIGH (ref 70–99)
Glucose-Capillary: 98 mg/dL (ref 70–99)

## 2022-09-21 SURGERY — REPAIR, TENDON, PATELLAR
Anesthesia: Spinal | Site: Knee | Laterality: Left

## 2022-09-21 MED ORDER — LACTATED RINGERS IV SOLN: INTRAVENOUS | Status: DC

## 2022-09-21 MED ORDER — METHOCARBAMOL 1000 MG/10ML IJ SOLN
500.0000 mg | Freq: Four times a day (QID) | INTRAVENOUS | Status: DC | PRN
  Filled 2022-09-21: qty 5

## 2022-09-21 MED ORDER — DOCUSATE SODIUM 100 MG PO CAPS
100.0000 mg | ORAL_CAPSULE | Freq: Two times a day (BID) | ORAL | Status: DC
  Administered 2022-09-21 – 2022-09-25 (×8): 100 mg via ORAL
  Filled 2022-09-21 (×8): qty 1

## 2022-09-21 MED ORDER — ORAL CARE MOUTH RINSE: 15.0000 mL | Freq: Once | OROMUCOSAL | Status: AC

## 2022-09-21 MED ORDER — ACETAMINOPHEN 325 MG PO TABS
325.0000 mg | ORAL_TABLET | Freq: Four times a day (QID) | ORAL | Status: DC | PRN
  Administered 2022-09-23: 650 mg via ORAL

## 2022-09-21 MED ORDER — ONDANSETRON HCL 4 MG/2ML IJ SOLN: 4.0000 mg | Freq: Four times a day (QID) | INTRAMUSCULAR | Status: DC | PRN

## 2022-09-21 MED ORDER — ZOLPIDEM TARTRATE 5 MG PO TABS
5.0000 mg | ORAL_TABLET | Freq: Every day | ORAL | Status: DC
  Administered 2022-09-21 – 2022-09-24 (×4): 5 mg via ORAL
  Filled 2022-09-21 (×4): qty 1

## 2022-09-21 MED ORDER — TRANEXAMIC ACID-NACL 1000-0.7 MG/100ML-% IV SOLN
INTRAVENOUS | Status: AC
  Filled 2022-09-21: qty 100

## 2022-09-21 MED ORDER — ALPRAZOLAM 0.5 MG PO TABS
0.5000 mg | ORAL_TABLET | Freq: Two times a day (BID) | ORAL | Status: DC | PRN
  Administered 2022-09-21 – 2022-09-25 (×3): 0.5 mg via ORAL
  Filled 2022-09-21 (×3): qty 1

## 2022-09-21 MED ORDER — ASPIRIN 81 MG PO TBEC
81.0000 mg | DELAYED_RELEASE_TABLET | Freq: Two times a day (BID) | ORAL | Status: DC
  Administered 2022-09-21 – 2022-09-25 (×8): 81 mg via ORAL
  Filled 2022-09-21 (×8): qty 1

## 2022-09-21 MED ORDER — PROPOFOL 1000 MG/100ML IV EMUL
INTRAVENOUS | Status: AC
  Filled 2022-09-21: qty 100

## 2022-09-21 MED ORDER — CHLORHEXIDINE GLUCONATE 0.12 % MT SOLN
15.0000 mL | Freq: Once | OROMUCOSAL | Status: AC
  Administered 2022-09-21: 15 mL via OROMUCOSAL
  Filled 2022-09-21: qty 15

## 2022-09-21 MED ORDER — ATENOLOL 50 MG PO TABS
50.0000 mg | ORAL_TABLET | Freq: Every day | ORAL | Status: DC
  Administered 2022-09-22 – 2022-09-25 (×4): 50 mg via ORAL
  Filled 2022-09-21 (×4): qty 1

## 2022-09-21 MED ORDER — FUROSEMIDE 40 MG PO TABS
80.0000 mg | ORAL_TABLET | Freq: Every day | ORAL | Status: DC
  Administered 2022-09-22 – 2022-09-25 (×4): 80 mg via ORAL
  Filled 2022-09-21 (×4): qty 2

## 2022-09-21 MED ORDER — NALOXONE HCL 4 MG/0.1ML NA LIQD: 1.0000 | Freq: Once | NASAL | Status: DC

## 2022-09-21 MED ORDER — CARVEDILOL 25 MG PO TABS
25.0000 mg | ORAL_TABLET | Freq: Two times a day (BID) | ORAL | Status: DC
  Administered 2022-09-21 – 2022-09-25 (×8): 25 mg via ORAL
  Filled 2022-09-21 (×8): qty 1

## 2022-09-21 MED ORDER — BUPIVACAINE IN DEXTROSE 0.75-8.25 % IT SOLN
INTRATHECAL | Status: DC | PRN
  Administered 2022-09-21: 1.4 mL via INTRATHECAL

## 2022-09-21 MED ORDER — HYDROMORPHONE HCL 1 MG/ML IJ SOLN
0.5000 mg | INTRAMUSCULAR | Status: DC | PRN
  Administered 2022-09-21 – 2022-09-22 (×3): 1 mg via INTRAVENOUS
  Filled 2022-09-21 (×3): qty 1

## 2022-09-21 MED ORDER — METOCLOPRAMIDE HCL 5 MG/ML IJ SOLN: 5.0000 mg | Freq: Three times a day (TID) | INTRAMUSCULAR | Status: DC | PRN

## 2022-09-21 MED ORDER — CEFAZOLIN SODIUM-DEXTROSE 2-4 GM/100ML-% IV SOLN
2.0000 g | Freq: Four times a day (QID) | INTRAVENOUS | Status: AC
  Administered 2022-09-21 – 2022-09-22 (×3): 2 g via INTRAVENOUS
  Filled 2022-09-21 (×3): qty 100

## 2022-09-21 MED ORDER — PANTOPRAZOLE SODIUM 40 MG PO TBEC
40.0000 mg | DELAYED_RELEASE_TABLET | Freq: Every day | ORAL | Status: DC
  Administered 2022-09-21 – 2022-09-25 (×5): 40 mg via ORAL
  Filled 2022-09-21 (×5): qty 1

## 2022-09-21 MED ORDER — HYDRALAZINE HCL 25 MG PO TABS
12.5000 mg | ORAL_TABLET | Freq: Four times a day (QID) | ORAL | Status: DC
  Administered 2022-09-21 – 2022-09-25 (×14): 12.5 mg via ORAL
  Filled 2022-09-21 (×14): qty 1

## 2022-09-21 MED ORDER — MAGNESIUM CITRATE PO SOLN: 1.0000 | Freq: Once | ORAL | Status: DC | PRN

## 2022-09-21 MED ORDER — BISACODYL 5 MG PO TBEC: 5.0000 mg | DELAYED_RELEASE_TABLET | Freq: Every day | ORAL | Status: DC | PRN

## 2022-09-21 MED ORDER — OXYCODONE HCL 5 MG PO TABS
10.0000 mg | ORAL_TABLET | ORAL | Status: DC | PRN
  Administered 2022-09-22 – 2022-09-24 (×6): 15 mg via ORAL
  Administered 2022-09-24: 10 mg via ORAL
  Administered 2022-09-25: 15 mg via ORAL
  Filled 2022-09-21 (×8): qty 3

## 2022-09-21 MED ORDER — FENTANYL CITRATE (PF) 100 MCG/2ML IJ SOLN: 25.0000 ug | INTRAMUSCULAR | Status: DC | PRN

## 2022-09-21 MED ORDER — MIDAZOLAM HCL 2 MG/2ML IJ SOLN
INTRAMUSCULAR | Status: AC
  Filled 2022-09-21: qty 2

## 2022-09-21 MED ORDER — SENNOSIDES-DOCUSATE SODIUM 8.6-50 MG PO TABS: 1.0000 | ORAL_TABLET | Freq: Every evening | ORAL | Status: DC | PRN

## 2022-09-21 MED ORDER — OXYCODONE HCL 5 MG PO TABS: 5.0000 mg | ORAL_TABLET | Freq: Once | ORAL | Status: DC | PRN

## 2022-09-21 MED ORDER — VITAMIN C 500 MG PO TABS
500.0000 mg | ORAL_TABLET | Freq: Every day | ORAL | Status: DC
  Administered 2022-09-21 – 2022-09-25 (×5): 500 mg via ORAL
  Filled 2022-09-21 (×5): qty 1

## 2022-09-21 MED ORDER — OXYCODONE HCL 5 MG/5ML PO SOLN: 5.0000 mg | Freq: Once | ORAL | Status: DC | PRN

## 2022-09-21 MED ORDER — GABAPENTIN 300 MG PO CAPS
300.0000 mg | ORAL_CAPSULE | Freq: Every day | ORAL | Status: DC
  Administered 2022-09-22 – 2022-09-25 (×4): 300 mg via ORAL
  Filled 2022-09-21 (×4): qty 1

## 2022-09-21 MED ORDER — BUDESONIDE 0.25 MG/2ML IN SUSP
0.2500 mg | Freq: Two times a day (BID) | RESPIRATORY_TRACT | Status: DC
  Administered 2022-09-21 – 2022-09-25 (×7): 0.25 mg via RESPIRATORY_TRACT
  Filled 2022-09-21 (×7): qty 2

## 2022-09-21 MED ORDER — METOCLOPRAMIDE HCL 5 MG PO TABS: 5.0000 mg | ORAL_TABLET | Freq: Three times a day (TID) | ORAL | Status: DC | PRN

## 2022-09-21 MED ORDER — PHENYLEPHRINE HCL-NACL 20-0.9 MG/250ML-% IV SOLN
INTRAVENOUS | Status: DC | PRN
  Administered 2022-09-21: 30 ug/min via INTRAVENOUS

## 2022-09-21 MED ORDER — EZETIMIBE 10 MG PO TABS
10.0000 mg | ORAL_TABLET | Freq: Every day | ORAL | Status: DC
  Administered 2022-09-21 – 2022-09-25 (×5): 10 mg via ORAL
  Filled 2022-09-21 (×5): qty 1

## 2022-09-21 MED ORDER — FENTANYL CITRATE (PF) 250 MCG/5ML IJ SOLN
INTRAMUSCULAR | Status: AC
  Filled 2022-09-21: qty 5

## 2022-09-21 MED ORDER — ACETAMINOPHEN 10 MG/ML IV SOLN: 1000.0000 mg | Freq: Once | INTRAVENOUS | Status: DC | PRN

## 2022-09-21 MED ORDER — ALBUTEROL SULFATE (2.5 MG/3ML) 0.083% IN NEBU: 2.5000 mg | INHALATION_SOLUTION | Freq: Four times a day (QID) | RESPIRATORY_TRACT | Status: DC | PRN

## 2022-09-21 MED ORDER — ONDANSETRON HCL 4 MG PO TABS: 4.0000 mg | ORAL_TABLET | Freq: Four times a day (QID) | ORAL | Status: DC | PRN

## 2022-09-21 MED ORDER — ACETAMINOPHEN 500 MG PO TABS
1000.0000 mg | ORAL_TABLET | Freq: Four times a day (QID) | ORAL | Status: AC
  Administered 2022-09-21 – 2022-09-22 (×4): 1000 mg via ORAL
  Filled 2022-09-21 (×4): qty 2

## 2022-09-21 MED ORDER — FENTANYL CITRATE (PF) 100 MCG/2ML IJ SOLN
INTRAMUSCULAR | Status: AC
  Filled 2022-09-21: qty 2

## 2022-09-21 MED ORDER — PROPOFOL 10 MG/ML IV BOLUS
INTRAVENOUS | Status: DC | PRN
  Administered 2022-09-21: 30 mg via INTRAVENOUS
  Administered 2022-09-21: 20 mg via INTRAVENOUS

## 2022-09-21 MED ORDER — INSULIN ASPART 100 UNIT/ML IJ SOLN: 0.0000 [IU] | INTRAMUSCULAR | Status: DC | PRN

## 2022-09-21 MED ORDER — CEFAZOLIN SODIUM-DEXTROSE 2-4 GM/100ML-% IV SOLN
2.0000 g | INTRAVENOUS | Status: AC
  Administered 2022-09-21: 2 g via INTRAVENOUS
  Filled 2022-09-21: qty 100

## 2022-09-21 MED ORDER — BUSPIRONE HCL 5 MG PO TABS
5.0000 mg | ORAL_TABLET | Freq: Two times a day (BID) | ORAL | Status: DC
  Administered 2022-09-21 – 2022-09-25 (×8): 5 mg via ORAL
  Filled 2022-09-21 (×8): qty 1

## 2022-09-21 MED ORDER — ONDANSETRON HCL 4 MG/2ML IJ SOLN: 4.0000 mg | Freq: Once | INTRAMUSCULAR | Status: DC | PRN

## 2022-09-21 MED ORDER — KCL IN DEXTROSE-NACL 20-5-0.45 MEQ/L-%-% IV SOLN
INTRAVENOUS | Status: DC
  Filled 2022-09-21: qty 1000

## 2022-09-21 MED ORDER — OXYCODONE HCL 5 MG PO TABS
5.0000 mg | ORAL_TABLET | ORAL | Status: DC | PRN
  Administered 2022-09-21: 10 mg via ORAL
  Filled 2022-09-21 (×2): qty 2

## 2022-09-21 MED ORDER — PROPOFOL 500 MG/50ML IV EMUL
INTRAVENOUS | Status: DC | PRN
  Administered 2022-09-21: 75 ug/kg/min via INTRAVENOUS

## 2022-09-21 MED ORDER — CLONIDINE HCL 0.1 MG PO TABS
0.2000 mg | ORAL_TABLET | Freq: Two times a day (BID) | ORAL | Status: DC
  Administered 2022-09-21 – 2022-09-25 (×8): 0.2 mg via ORAL
  Filled 2022-09-21 (×8): qty 2

## 2022-09-21 MED ORDER — METHOCARBAMOL 500 MG PO TABS
500.0000 mg | ORAL_TABLET | Freq: Four times a day (QID) | ORAL | Status: DC | PRN
  Administered 2022-09-21 – 2022-09-22 (×2): 500 mg via ORAL
  Filled 2022-09-21 (×2): qty 1

## 2022-09-21 SURGICAL SUPPLY — 81 items
BAG COUNTER SPONGE SURGICOUNT (BAG) ×1 IMPLANT
BAG SPNG CNTER NS LX DISP (BAG) ×1
BANDAGE ESMARK 6X9 LF (GAUZE/BANDAGES/DRESSINGS) ×1 IMPLANT
BIT DRILL 7/64X5 DISP (BIT) ×1 IMPLANT
BNDG CMPR 9X6 STRL LF SNTH (GAUZE/BANDAGES/DRESSINGS) ×2
BNDG COHESIVE 4X5 TAN STRL (GAUZE/BANDAGES/DRESSINGS) ×1 IMPLANT
BNDG ELASTIC 4X5.8 VLCR STR LF (GAUZE/BANDAGES/DRESSINGS) ×1 IMPLANT
BNDG ELASTIC 6X5.8 VLCR STR LF (GAUZE/BANDAGES/DRESSINGS) ×1 IMPLANT
BNDG ESMARK 6X9 LF (GAUZE/BANDAGES/DRESSINGS) ×2
COVER MAYO STAND STRL (DRAPES) ×1 IMPLANT
COVER SURGICAL LIGHT HANDLE (MISCELLANEOUS) IMPLANT
CUFF TOURN SGL QUICK 34 (TOURNIQUET CUFF)
CUFF TOURN SGL QUICK 42 (TOURNIQUET CUFF) IMPLANT
CUFF TRNQT CYL 34X4.125X (TOURNIQUET CUFF) IMPLANT
DRAPE INCISE IOBAN 66X45 STRL (DRAPES) ×1 IMPLANT
DRAPE U-SHAPE 47X51 STRL (DRAPES) ×1 IMPLANT
DRSG AQUACEL AG ADV 3.5X10 (GAUZE/BANDAGES/DRESSINGS) IMPLANT
DURAPREP 26ML APPLICATOR (WOUND CARE) ×1 IMPLANT
ELECT REM PT RETURN 9FT ADLT (ELECTROSURGICAL) ×1
ELECTRODE REM PT RTRN 9FT ADLT (ELECTROSURGICAL) ×1 IMPLANT
FACESHIELD WRAPAROUND (MASK) ×2 IMPLANT
FACESHIELD WRAPAROUND OR TEAM (MASK) IMPLANT
GAUZE PAD ABD 8X10 STRL (GAUZE/BANDAGES/DRESSINGS) ×1 IMPLANT
GAUZE SPONGE 4X4 12PLY STRL (GAUZE/BANDAGES/DRESSINGS) ×1 IMPLANT
GAUZE XEROFORM 1X8 LF (GAUZE/BANDAGES/DRESSINGS) ×1 IMPLANT
GLOVE BIO SURGEON STRL SZ7.5 (GLOVE) ×1 IMPLANT
GLOVE BIO SURGEON STRL SZ8.5 (GLOVE) ×1 IMPLANT
GLOVE BIOGEL PI IND STRL 8 (GLOVE) ×1 IMPLANT
GLOVE BIOGEL PI IND STRL 9 (GLOVE) ×1 IMPLANT
GLOVE SURG SS PI 6.5 STRL IVOR (GLOVE) IMPLANT
GOWN STRL REUS W/ TWL LRG LVL3 (GOWN DISPOSABLE) ×2 IMPLANT
GOWN STRL REUS W/ TWL XL LVL3 (GOWN DISPOSABLE) ×1 IMPLANT
GOWN STRL REUS W/TWL 2XL LVL3 (GOWN DISPOSABLE) ×1 IMPLANT
GOWN STRL REUS W/TWL LRG LVL3 (GOWN DISPOSABLE) ×2
GOWN STRL REUS W/TWL XL LVL3 (GOWN DISPOSABLE) ×1
GRAFT TISS 230-320 GRACILIS (Bone Implant) IMPLANT
IMMOBILIZER KNEE 22 UNIV (SOFTGOODS) IMPLANT
KIT BASIN OR (CUSTOM PROCEDURE TRAY) ×1 IMPLANT
KIT TURNOVER KIT B (KITS) ×1 IMPLANT
MANIFOLD NEPTUNE II (INSTRUMENTS) ×1 IMPLANT
NDL 22X1.5 STRL (OR ONLY) (MISCELLANEOUS) IMPLANT
NDL SUT .5 MAYO 1.404X.05X (NEEDLE) IMPLANT
NEEDLE 22X1.5 STRL (OR ONLY) (MISCELLANEOUS) IMPLANT
NEEDLE MAYO TAPER (NEEDLE) ×1
NS IRRIG 1000ML POUR BTL (IV SOLUTION) ×1 IMPLANT
PACK ORTHO EXTREMITY (CUSTOM PROCEDURE TRAY) ×1 IMPLANT
PAD ARMBOARD 7.5X6 YLW CONV (MISCELLANEOUS) ×2 IMPLANT
PAD CAST 4YDX4 CTTN HI CHSV (CAST SUPPLIES) ×2 IMPLANT
PADDING CAST COTTON 4X4 STRL (CAST SUPPLIES) ×2
PASSER SUT SWANSON 36MM LOOP (INSTRUMENTS) ×1 IMPLANT
SPONGE T-LAP 18X18 ~~LOC~~+RFID (SPONGE) ×1 IMPLANT
STAPLER VISISTAT 35W (STAPLE) ×1 IMPLANT
STOCKINETTE IMPERVIOUS 9X36 MD (GAUZE/BANDAGES/DRESSINGS) ×1 IMPLANT
STOCKINETTE IMPERVIOUS LG (DRAPES) IMPLANT
SUCTION FRAZIER HANDLE 10FR (MISCELLANEOUS)
SUCTION TUBE FRAZIER 10FR DISP (MISCELLANEOUS) IMPLANT
SUT ETHIBOND 2 V 37 (SUTURE) IMPLANT
SUT FIBERWIRE #2 38 REV NDL BL (SUTURE)
SUT FIBERWIRE #5 38 CONV NDL (SUTURE) ×1
SUT PDS AB 4-0 P3 18 (SUTURE) IMPLANT
SUT VIC AB 1 CT1 27 (SUTURE) ×3
SUT VIC AB 1 CT1 27XBRD ANBCTR (SUTURE) ×1 IMPLANT
SUT VIC AB 1 CT1 27XBRD ANTBC (SUTURE) IMPLANT
SUT VIC AB 2-0 CTB1 (SUTURE) ×1 IMPLANT
SUT VIC AB 3-0 CT1 27 (SUTURE) ×2
SUT VIC AB 3-0 CT1 TAPERPNT 27 (SUTURE) IMPLANT
SUT VIC AB 3-0 PS1 18 (SUTURE) ×2
SUT VIC AB 3-0 PS1 18XBRD (SUTURE) IMPLANT
SUTURE FIBERWR #5 38 CONV NDL (SUTURE) IMPLANT
SUTURE FIBERWR#2 38 REV NDL BL (SUTURE) IMPLANT
SYR CONTROL 10ML LL (SYRINGE) IMPLANT
SYS FBRTK BUTTON 2.6 (Anchor) ×1 IMPLANT
SYSTEM FBRTK BUTTON 2.6 (Anchor) IMPLANT
TENDON GRACILIS FROZEN (Bone Implant) IMPLANT
TENDON GRACILIS FROZEN 230-320 (Bone Implant) IMPLANT
TOWEL GREEN STERILE (TOWEL DISPOSABLE) ×1 IMPLANT
TOWEL GREEN STERILE FF (TOWEL DISPOSABLE) ×1 IMPLANT
TUBE CONNECTING 12X1/4 (SUCTIONS) ×1 IMPLANT
UNDERPAD 30X36 HEAVY ABSORB (UNDERPADS AND DIAPERS) ×1 IMPLANT
WATER STERILE IRR 1000ML POUR (IV SOLUTION) ×1 IMPLANT
YANKAUER SUCT BULB TIP NO VENT (SUCTIONS) ×1 IMPLANT

## 2022-09-21 NOTE — Transfer of Care (Signed)
Immediate Anesthesia Transfer of Care Note  Patient: [name]  Procedure(s) Performed: LEFT PATELLAR TENDON REPAIR (Left: Knee)  Patient Location: PACU  Anesthesia Type:MAC  Level of Consciousness: alert  and patient cooperative  Airway & Oxygen Therapy: Patient Spontanous Breathing  Post-op Assessment: Report given to RN and Post -op Vital signs reviewed and stable  Post vital signs: Reviewed and stable  Last Vitals:  Vitals Value Taken Time  BP    Temp    Pulse    Resp    SpO2      Last Pain:  Vitals:   09/21/22 0958  TempSrc:   PainSc: 0-No pain         Complications: No notable events documented.

## 2022-09-21 NOTE — Anesthesia Postprocedure Evaluation (Signed)
Anesthesia Post Note  Patient: [name]  Procedure(s) Performed: LEFT PATELLAR TENDON REPAIR (Left: Knee)     Patient location during evaluation: Nursing Unit Anesthesia Type: Spinal Level of consciousness: oriented and awake and alert Pain management: pain level controlled Vital Signs Assessment: post-procedure vital signs reviewed and stable Respiratory status: spontaneous breathing and respiratory function stable Cardiovascular status: blood pressure returned to baseline and stable Postop Assessment: no headache, no backache, no apparent nausea or vomiting and patient able to bend at knees Anesthetic complications: no  No notable events documented.  Last Vitals:  Vitals:   09/21/22 1537 09/21/22 1552  BP: (!) 147/71 (!) 150/75  Pulse: 72 77  Resp: 13 19  Temp:    SpO2: 98% 98%    Last Pain:  Vitals:   09/21/22 1537  TempSrc:   PainSc: 0-No pain                 Barnet Glasgow

## 2022-09-21 NOTE — Anesthesia Procedure Notes (Signed)
Procedure Name: MAC Date/Time: 09/21/2022 12:11 PM  Performed by: Janace Litten, CRNAPre-anesthesia Checklist: Patient identified, Emergency Drugs available, Suction available and Patient being monitored Patient Re-evaluated:Patient Re-evaluated prior to induction Oxygen Delivery Method: Simple face mask

## 2022-09-21 NOTE — Anesthesia Procedure Notes (Signed)
Spinal  Patient location during procedure: OR Start time: 09/21/2022 12:12 PM End time: 09/21/2022 12:17 PM Reason for block: surgical anesthesia Staffing Performed: anesthesiologist  Anesthesiologist: Myrtie Soman, MD Performed by: Myrtie Soman, MD Authorized by: Myrtie Soman, MD   Preanesthetic Checklist Completed: patient identified, IV checked, site marked, risks and benefits discussed, surgical consent, monitors and equipment checked, pre-op evaluation and timeout performed Spinal Block Patient position: sitting Prep: Betadine Patient monitoring: heart rate, continuous pulse ox and blood pressure Approach: midline Location: L3-4 Injection technique: single-shot Needle Needle type: Sprotte  Needle gauge: 24 G Needle length: 9 cm Assessment Sensory level: T6 Events: CSF return Additional Notes

## 2022-09-21 NOTE — Op Note (Signed)
Pre Op Dx: Inferior pole patella fracture left total knee with total disruption of the extensor mechanism  Post Op Dx: Inferior patella pole fracture left total knee with disruption of extensor mechanism and complete tear of the medial collateral ligament.  Procedure: Suture repair of the inferior pole left patellar fracture with #2 and #5 FiberWire, repair of the medial collateral ligament Arthrex 3 point 5 suture anchor.  Surgeon: Kerin Salen, MD  Assistant: Kerry Hough. Barton Dubois  (present throughout entire procedure and necessary for timely completion of the procedure)  Anesthesia: Spinal  EBL: 200 cc  Fluids: 1500 cc  Indications: 88 year old female had primary left total knee arthroplasty on 09/03/2022.  Approximately 9 days after her primary procedure she was found at home by her family laying on the floor with a presumed fall around 3 in the morning.  She was taken to our office where we diagnosed her with a inferior pole patella fracture with complete disruption of the extensor mechanism.  Findings were discussed with the patient and her family and we elected to perform a repair after waiting another week to let the wound settle down.  Fortunately there was no drainage the wound was healing nicely no fevers or chills or signs of infection.  The risk and benefits of surgery were discussed at length with the family.  Specifically there is a 50% chance that a primary tendon repair will fail after a total knee.  They have chosen to proceed with the surgery.  Procedure: Patient identified by armband in the preop area received preoperative IV antibiotics and was taken to operating room 5 at [facility] sick monitors attached and spinal anesthesia was induced.  She was then placed supine on the operating table with a sure foot beneath the left heel and a lateral post.  Tourniquet was applied to the left lower extremity which was then prepped and draped in usual sterile fashion  from the ankle to the tourniquet.  Timeout procedure was performed.  The limb was wrapped with an Esmarch bandage and the tourniquet inflated to 300 mmHg.  The knee was flexed to 30 degrees with a sure foot and we recreated the anterior incision from the total knee and immediately identified the complete tear of the extensor mechanism as well as the medial retinaculum and more importantly the MCL.  The MCL was tagged at this time.  We briefly explored for her hamstring tendons which were not usable.  At this point we elected to perform a primary repair with #2 FiberWire reinforced with #5 FiberWire figure-of-eight and loop over the top of the patella.  On the side of the avulsion #2 FiberWire's were woven using a running interlocking stitch up-and-down medially and laterally going over the patella utilizing the patellar tendon.  We elected not to drill into the bone because of her osteoporosis.  In a similar fashion running interlocking suture was placed in the patellar tendon through the inferior pole fracture medially and laterally going distally and proximally.  This left [place] with 4 suture ends proximally and distally at the avulsion site.  With the knee in extension the tendon was easily reduced and the inferior pole of the patella and slightly over reduced with tension is on the sutures as we tied them individually.  We then drilled a hole just distal to the tibial tubercle allowing passage of a #5 FiberWire which was then taken over the top of the patella from lateral to medial through the  patellar tendon and then taken back down to the drill hole in the tibia just distal to the tubercle.  This loop was then tensioned slightly over reducing the patella and tied.  A second figure-of-eight loop was then made in a similar fashion.  The knee was taken through range of motion from 0 to 90 degrees with good stability.  We then placed a 3.5 mm Arthrex anchor just distal to the insertion of the MCL.  Using the 4 suture  strands coming out of the Arthrex anchor we then repaired the MCL back down to the medial side of the tibia.  At this point the tourniquet was let down no significant bleeders were noted.  We repaired the medial retinaculum with running #1 Vicryl.  The subcutaneous tissue was closed with 3-0 Vicryl subcutaneous suture, and a 3-0 subcuticular Vicryl suture and the skin.  Aquacel dressing and an Ace wrap was applied.  The patient was immediately placed in a knee immobilizer.  She will be kept in the knee immobilizer for 6 weeks she will be weightbearing as tolerated strictly supervised by physical therapy and only getting up with a walker.

## 2022-09-22 NOTE — Plan of Care (Signed)
  Problem: Education: Goal: Knowledge of the prescribed therapeutic regimen will improve Outcome: Not Progressing Goal: Individualized Educational Video(s) Outcome: Not Progressing   Problem: Activity: Goal: Ability to avoid complications of mobility impairment will improve Outcome: Not Progressing Goal: Range of joint motion will improve Outcome: Not Progressing   Problem: Clinical Measurements: Goal: Postoperative complications will be avoided or minimized Outcome: Not Progressing   Problem: Pain Management: Goal: Pain level will decrease with appropriate interventions Outcome: Not Progressing   Problem: Skin Integrity: Goal: Will show signs of wound healing Outcome: Not Progressing   Problem: Education: Goal: Knowledge of General Education information will improve Description: Including pain rating scale, medication(s)/side effects and non-pharmacologic comfort measures Outcome: Not Progressing   Problem: Health Behavior/Discharge Planning: Goal: Ability to manage health-related needs will improve Outcome: Not Progressing   Problem: Clinical Measurements: Goal: Ability to maintain clinical measurements within normal limits will improve Outcome: Not Progressing Goal: Will remain free from infection Outcome: Not Progressing Goal: Diagnostic test results will improve Outcome: Not Progressing Goal: Respiratory complications will improve Outcome: Not Progressing Goal: Cardiovascular complication will be avoided Outcome: Not Progressing   Problem: Activity: Goal: Risk for activity intolerance will decrease Outcome: Not Progressing   Problem: Nutrition: Goal: Adequate nutrition will be maintained Outcome: Not Progressing   Problem: Coping: Goal: Level of anxiety will decrease Outcome: Not Progressing   Problem: Elimination: Goal: Will not experience complications related to bowel motility Outcome: Not Progressing Goal: Will not experience complications related  to urinary retention Outcome: Not Progressing   Problem: Pain Managment: Goal: General experience of comfort will improve Outcome: Not Progressing   Problem: Safety: Goal: Ability to remain free from injury will improve Outcome: Not Progressing   Problem: Skin Integrity: Goal: Risk for impaired skin integrity will decrease Outcome: Not Progressing   

## 2022-09-22 NOTE — Evaluation (Signed)
Physical Therapy Evaluation Patient Details Name: [name] MRN: [id number] DOB: [birth year] Today's Date: 09/22/2022  History of Present Illness  Pt is an 88 y/o female admitted 12/29 for management of a L patellar fx sustained in a fall post L TKA 12/11.  Pt s/psuture repair of the patella, also the repair of the MCL.  PMHx:  DM, HTN, LTKA, Skin CA of the face.  Clinical Impression  Pt admitted with/for fx'd L patella post fall.  Pt back to needing minimal assist for basic mobility/gait.  Pt currently limited functionally due to the problems listed. ( See problems list.)   Pt will benefit from PT to maximize function and safety in order to get ready for next venue listed below.        Recommendations for follow up therapy are one component of a multi-disciplinary discharge planning process, led by the attending physician.  Recommendations may be updated based on patient status, additional functional criteria and insurance authorization.  Follow Up Recommendations Skilled nursing-short term rehab (<3 hours/day) Can patient physically be transported by private vehicle: Yes    Assistance Recommended at Discharge Intermittent Supervision/Assistance  Patient can return home with the following  A little help with walking and/or transfers;A little help with bathing/dressing/bathroom;Assistance with cooking/housework;Assist for transportation;Help with stairs or ramp for entrance    Equipment Recommendations None recommended by PT  Recommendations for Other Services       Functional Status Assessment Patient has had a recent decline in their functional status and demonstrates the ability to make significant improvements in function in a reasonable and predictable amount of time.     Precautions / Restrictions Precautions Precautions: Fall Restrictions LLE Weight Bearing: Weight bearing as tolerated      Mobility  Bed Mobility Overal bed mobility: Needs Assistance Bed Mobility:  Supine to Sit     Supine to sit: Min assist     General bed mobility comments: assisted bridging to EOB, min truncal assist until upright, then pt scooted to EOB without assist, but with mild struggle.    Transfers Overall transfer level: Needs assistance Equipment used: Rolling walker (2 wheels) Transfers: Sit to/from Stand, Bed to chair/wheelchair/BSC Sit to Stand: Min assist   Step pivot transfers: Min assist       General transfer comment: stability assist, cues for sequencing, w/shift assist .    Ambulation/Gait   Gait Distance (Feet): 2 Feet (forward and back) Assistive device: Rolling walker (2 wheels) Gait Pattern/deviations: Step-to pattern       General Gait Details: min assist, cues for sequencing  Stairs            Wheelchair Mobility    Modified Rankin (Stroke Patients Only)       Balance Overall balance assessment: Needs assistance Sitting-balance support: No upper extremity supported, Single extremity supported, Feet supported Sitting balance-Leahy Scale: Fair     Standing balance support: Reliant on assistive device for balance, Bilateral upper extremity supported, During functional activity Standing balance-Leahy Scale: Poor Standing balance comment: reliant on AD or external support due to new pain.                             Pertinent Vitals/Pain Pain Assessment Pain Assessment: 0-10 Faces Pain Scale: Hurts even more Pain Location: L knee Pain Descriptors / Indicators: Sharp Pain Intervention(s): Limited activity within patient's tolerance, Monitored during session    Home Living Family/patient expects to be discharged to:: Private  residence Living Arrangements: Children Available Help at Discharge: Family Type of Home: Mobile home Home Access: Stairs to enter Entrance Stairs-Rails: Chemical engineer of Steps: 4   Home Layout: One level [place]: Conservation officer, nature (2 wheels)      Prior  Function Prior Level of Function : Independent/Modified Independent;Other (comment) (needing assist post L TKA)                     Hand Dominance        Extremity/Trunk Assessment   Upper Extremity Assessment Upper Extremity Assessment: Overall WFL for tasks assessed    Lower Extremity Assessment Lower Extremity Assessment: Generalized weakness;LLE deficits/detail LLE Deficits / Details: Immobilized,  pt able to move in a gross uncoordinated manner in stance.  Unable to lift against gravity. LLE Coordination: decreased fine motor    Cervical / Trunk Assessment Cervical / Trunk Assessment: Normal  Communication   Communication: No difficulties  Cognition Arousal/Alertness: Awake/alert Behavior During Therapy: WFL for tasks assessed/performed Overall Cognitive Status: Within Functional Limits for tasks assessed                                          General Comments      Exercises     Assessment/Plan    PT Assessment Patient needs continued PT services  PT Problem List Decreased strength;Decreased range of motion;Decreased activity tolerance;Decreased balance;Decreased mobility;Pain       PT Treatment Interventions DME instruction;Gait training;Stair training;Functional mobility training;Therapeutic activities;Patient/family education    PT Goals (Current goals can be found in the Care Plan section)  Acute Rehab PT Goals Patient Stated Goal: regain PLOF/independence PT Goal Formulation: With patient Time For Goal Achievement: 11/03/22 Potential to Achieve Goals: Good    Frequency Min 3X/week     Co-evaluation               AM-PAC PT "6 Clicks" Mobility  Outcome Measure Help needed turning from your back to your side while in a flat bed without using bedrails?: A Little Help needed moving from lying on your back to sitting on the side of a flat bed without using bedrails?: A Little Help needed moving to and from a bed to a  chair (including a wheelchair)?: A Little Help needed standing up from a chair using your arms (e.g., wheelchair or bedside chair)?: A Little Help needed to walk in hospital room?: A Little Help needed climbing 3-5 steps with a railing? : A Lot 6 Click Score: 17    End of Session Equipment Utilized During Treatment: Left knee immobilizer Activity Tolerance: Patient tolerated treatment well Patient left: in chair;with call bell/phone within reach;with family/visitor present Nurse Communication: Mobility status;Weight bearing status PT Visit Diagnosis: Other abnormalities of gait and mobility (R26.89);Pain Pain - Right/Left: Left Pain - part of body: Knee    Time: 6967-8938 PT Time Calculation (min) (ACUTE ONLY): 32 min   Charges:   PT Evaluation $PT Eval Moderate Complexity: 1 Mod PT Treatments $Therapeutic Activity: 8-22 mins        09/22/2022  Ginger Carne., PT Acute Rehabilitation Services [phone]  (office)  Tessie Fass Michio Thier 09/22/2022, 12:29 PM

## 2022-09-22 NOTE — Progress Notes (Signed)
     [name] is a 88 y.o. female   Subjective: Patient appears comfortable in bed.  She reports pain controlled.  There is no family present in the room.  She tells me she has not been out of bed.  States the knee immobilizer felt a bit tight overnight and it was then loosened.  Otherwise, she voices no concerns.  There is plan for SNF placement at discharge when available.  Reports she is eating and drinking well.  Objectyive: Vitals:   09/22/22 0756 09/22/22 0757  BP: (!) 152/47   Pulse: 88   Resp: 16   Temp: 97.8 F (36.6 C)   SpO2: 97% 97%     Exam: Awake and alert Respirations even and unlabored No acute distress  Examination of the left lower extremity demonstrates appropriate placement of the knee immobilizer but it is a bit loose distally.  It was gently opened.  Dressing in place.  Swelling appropriate.  The leg is held in a neutral position.  There is no deformity.  Not assessed for range of motion at the knee.  Calfs and compartments are soft and nontender.  The knee immobilizer is reapproximated for better fitment.  He is able to plantarflex and dorsiflex the ankle without difficulty.  She can wiggle the toes.  Warm and well-perfused distally with intact sensation.  Assessment: Postop day 1 status post repair of left knee inferior patella pole fracture and MCL Status post left total knee arthroplasty on 09/03/2022   Plan: Patient appears to be doing well.  Did discuss the importance of keeping the knee immobilizer intact and avoiding range of motion at the knee.  I also verbalized the importance of protected weightbearing with walker only when strictly supervised by physical therapy.  She appeared to understand.  Planning for SNF placement at discharge.  -Pain control as needed. -Aspirin 81 mg twice daily for DVT prophylaxis -Keep knee immobilizer in place at all times.      Kristine Viswanathan J. [place], PA-C

## 2022-09-23 NOTE — NC FL2 (Signed)
[place] LEVEL OF CARE FORM     IDENTIFICATION  Patient Name: [name] Birthdate: 10/13/1931 Sex: female Admission Date (Current Location): 09/21/2022  [facility] and [place] Number:  Herbalist and Address:  [facility] [place], [place] [address] [place], [place] [address]      Provider Number: [id number]  Attending Physician Name and Address:  Frederik Pear, MD  Relative Name and Phone Number:  Aliene Beams   [phone]    Current Level of Care: Hospital Recommended Level of Care: [place] Prior Approval Number:    Date Approved/Denied:   PASRR Number: [id number]  Discharge Plan: SNF    Current Diagnoses: Patient Active Problem List   Diagnosis Date Noted   Periprosthetic fracture of patella 09/21/2022   Peri-prosthetic patellar fracture 09/19/2022   S/P total knee arthroplasty, left 09/03/2022   Degenerative arthritis of left knee 01/18/2021   S/P reverse total shoulder arthroplasty, 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   Primary osteoarthritis of right hip 01/20/2018   Osteoarthritis of right knee 01/18/2018   Chronic pain syndrome    Anxiety    Right hip pain 11/29/2017   CKD (chronic kidney disease) stage 2, GFR 60-89 ml/min 11/29/2017   Prosthetic joint infection ([place])    Renal failure 05/27/2012   HTN (hypertension) 10/17/2011   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    Orientation RESPIRATION BLADDER Height & Weight     Self, Time, Situation, Place  Normal Continent Weight: 176 lb 5.9 oz (80 kg) Height:  5' 5.5" (166.4 cm)  BEHAVIORAL SYMPTOMS/MOOD NEUROLOGICAL BOWEL NUTRITION STATUS      Continent Diet (see discharge summary)  AMBULATORY STATUS COMMUNICATION OF NEEDS Skin   Limited Assist Verbally Surgical wounds                       Personal Care Assistance Level of Assistance  Bathing, Feeding, Dressing Bathing Assistance: Limited assistance Feeding assistance:  Independent Dressing Assistance: Limited assistance     Functional Limitations Info  Sight, Hearing, Speech Sight Info: Adequate Hearing Info: Adequate Speech Info: Adequate    SPECIAL CARE FACTORS FREQUENCY  PT (By licensed PT), OT (By licensed OT)     PT Frequency: 5x week OT Frequency: 5x week            Contractures Contractures Info: Not present    Additional Factors Info  Code Status, Allergies Code Status Info: full Allergies Info: Lisinopril, Hydrochlorothiazide, Lipitor (Atorvastatin Calcium), Thiazide-type Diuretics, Zocor (Simvastatin), Codeine           Current Medications (09/23/2022):  This is the current hospital active medication list Current Facility-Administered Medications  Medication Dose Route Frequency Provider Last Rate Last Admin   acetaminophen (TYLENOL) tablet 325-650 mg  325-650 mg Oral Q6H PRN Leighton Parody, PA-C       albuterol (PROVENTIL) (2.5 MG/3ML) 0.083% nebulizer solution 2.5 mg  2.5 mg Inhalation Q6H PRN Leighton Parody, PA-C       ALPRAZolam Duanne Moron) tablet 0.5 mg  0.5 mg Oral BID PRN Leighton Parody, PA-C   0.5 mg at 09/21/22 1728   ascorbic acid (VITAMIN C) tablet 500 mg  500 mg Oral Daily Leighton Parody, PA-C   500 mg at 09/23/22 1034   aspirin EC tablet 81 mg  81 mg Oral BID Leighton Parody, PA-C   81 mg at 09/23/22 1034   atenolol (TENORMIN) tablet 50 mg  50 mg Oral Daily Hardin Negus,  Kerry Hough, PA-C   50 mg at 09/23/22 1035   bisacodyl (DULCOLAX) EC tablet 5 mg  5 mg Oral Daily PRN Leighton Parody, PA-C       budesonide (PULMICORT) nebulizer solution 0.25 mg  0.25 mg Nebulization BID Leighton Parody, PA-C   0.25 mg at 09/23/22 0757   busPIRone (BUSPAR) tablet 5 mg  5 mg Oral BID Leighton Parody, PA-C   5 mg at 09/23/22 1035   carvedilol (COREG) tablet 25 mg  25 mg Oral BID WC Leighton Parody, PA-C   25 mg at 09/23/22 0805   cloNIDine (CATAPRES) tablet 0.2 mg  0.2 mg Oral BID Leighton Parody, PA-C   0.2 mg at 09/23/22 1035    dextrose 5 % and 0.45 % NaCl with KCl 20 mEq/L infusion   Intravenous Continuous Leighton Parody, PA-C       docusate sodium (COLACE) capsule 100 mg  100 mg Oral BID Leighton Parody, PA-C   100 mg at 09/23/22 1035   ezetimibe (ZETIA) tablet 10 mg  10 mg Oral Daily Leighton Parody, PA-C   10 mg at 09/23/22 1035   furosemide (LASIX) tablet 80 mg  80 mg Oral Daily Leighton Parody, PA-C   80 mg at 09/23/22 1035   gabapentin (NEURONTIN) capsule 300 mg  300 mg Oral Daily Leighton Parody, PA-C   300 mg at 09/23/22 1035   hydrALAZINE (APRESOLINE) tablet 12.5 mg  12.5 mg Oral QID Leighton Parody, PA-C   12.5 mg at 09/23/22 1035   HYDROmorphone (DILAUDID) injection 0.5-1 mg  0.5-1 mg Intravenous Q4H PRN Leighton Parody, PA-C   1 mg at 09/22/22 1696   magnesium citrate solution 1 Bottle  1 Bottle Oral Once PRN Leighton Parody, PA-C       methocarbamol (ROBAXIN) tablet 500 mg  500 mg Oral Q6H PRN Leighton Parody, PA-C   500 mg at 09/22/22 7893   Or   methocarbamol (ROBAXIN) 500 mg in dextrose 5 % 50 mL IVPB  500 mg Intravenous Q6H PRN Leighton Parody, PA-C       metoCLOPramide (REGLAN) tablet 5-10 mg  5-10 mg Oral Q8H PRN Leighton Parody, PA-C       Or   metoCLOPramide (REGLAN) injection 5-10 mg  5-10 mg Intravenous Q8H PRN Joanell Rising K, PA-C       ondansetron [facility]) tablet 4 mg  4 mg Oral Q6H PRN Leighton Parody, PA-C       Or   ondansetron [facility]) injection 4 mg  4 mg Intravenous Q6H PRN Leighton Parody, PA-C       oxyCODONE (Oxy IR/ROXICODONE) immediate release tablet 10-15 mg  10-15 mg Oral Q4H PRN Leighton Parody, PA-C   15 mg at 09/23/22 8101   oxyCODONE (Oxy IR/ROXICODONE) immediate release tablet 5-10 mg  5-10 mg Oral Q4H PRN Leighton Parody, PA-C   10 mg at 09/21/22 2212   pantoprazole (PROTONIX) EC tablet 40 mg  40 mg Oral Daily Leighton Parody, PA-C   40 mg at 09/23/22 1035   regadenoson (LEXISCAN) 0.4 MG/5ML injection SOLN            senna-docusate (Senokot-S) tablet 1  tablet  1 tablet Oral QHS PRN Leighton Parody, PA-C       zolpidem (AMBIEN) tablet 5 mg  5 mg Oral QHS Joanell Rising K, PA-C   5 mg at 09/22/22 2115  Discharge Medications: Please see discharge summary for a list of discharge medications.  Relevant Imaging Results:  Relevant Lab Results:   Additional Information SSN: [id number]  Joanne Chars, LCSW

## 2022-09-23 NOTE — Progress Notes (Signed)
     [name] is a 88 y.o. female   Subjective: Patient states she feels well.  Pain well-controlled.  She reports compliance and understanding of restrictions.  She has adjusted the knee immobilizer once again overnight.  Otherwise, no new concerns.  Planning for SNF placement at discharge.   Objectyive: Vitals:   09/23/22 0757 09/23/22 0905  BP:  (!) 156/51  Pulse: 86 94  Resp: 16 16  Temp:  99.8 F (37.7 C)  SpO2:  95%     Exam: Awake and alert Respirations even and unlabored No acute distress  Examination of the left lower extremity demonstrates appropriate placement of the knee immobilizer but it is a bit loose. It was gently opened. Dressing in place. Swelling appropriate. The leg is held in a neutral position. There is no deformity.  Did not assess for range of motion at the knee. Calfs and compartments are soft and nontender. The knee immobilizer is reapproximated for better fitment. He is able to plantarflex and dorsiflex the ankle without difficulty. She can wiggle the toes. Warm and well-perfused distally with intact sensation.   Assessment: Postop day 2 status post repair of left knee inferior pole patella fracture and MCL Status post left total knee arthroplasty on 09/03/2022   Plan: Patient doing well and suitable for discharge to SNF once placement obtained.  The patient appears to understand restrictions and plan.  -Keep knee immobilizer intact at all times.  No range of motion at the knee.  Weightbearing as tolerated with the walker when strictly supervised by physical therapy.  Restrictions effective for 6 weeks. -Pain control as needed.  Well-controlled on current regimen.  Plan for oxycodone at discharge -Aspirin 81 mg twice daily for DVT prophylaxis   Follow-up with Dr. Mayer Camel outpatient at [place] 2 weeks from surgery for reevaluation.   Kristine Lezotte J. Martinique, PA-C

## 2022-09-23 NOTE — TOC Initial Note (Addendum)
Transition of Care [facility]) - Initial/Assessment Note    Patient Details  Name: [name] MRN: [id number] Date of Birth: [birth year]  Transition [facility] Care [facility]) CM/SW Contact:    Joanne Chars, LCSW Phone Number: 09/23/2022, 10:16 AM  Clinical Narrative:     CSW met with pt regarding DC recommendation for SNF.  Pt is not in agreement with this plan and wants to return home.  Per chart, [facility] is active, pt reports they are no longer coming to the home.  Pt usually lives alone but reports daughter Lynelle Smoke is currently staying with her to assist.  Permission given to speak with Tammy and also daughter in law Joseph Art and son Barbaraann Rondo.  CSW and pt attempted to call Tammy from the room to confirm [facility] choice.  No answer.  CSW left medicare choice document for SNF just in case plan changes to SNF.       1020: TC from daughter 17.  CSW explained the above, Tammy confirmed that she is staying with pt but did not want to discuss DC plan, asked CSW to call son Barbaraann Rondo at [phone].  CSW unable to reach [place] at this number.         1110: CSW received call from pt son Barbaraann Rondo.  Pt lives alone, daughter Lynelle Smoke is not able to care for her.  Barbaraann Rondo lives nearby but pt was just sent home several weeks ago and was unable to care for self, which led to this readmission.  He would like to pursue SNF, had talked with pt yesterday and she was agreeable, he will speak with her again.  [facility].  Would like options in [place] and [place].   Referral sent out in hub for SNF.   Expected Discharge Plan: [place] Barriers to Discharge: Continued Medical Work up   Patient Goals and CMS Choice Patient states their goals for this hospitalization and ongoing recovery are:: get back to running around like I used to          Expected Discharge Plan and Services In-house Referral: Clinical Social Work   Post Acute Care Choice: [place]  arrangements for the past 2 months: [place]                                      Prior Living Arrangements/Services Living arrangements for the past 2 months: Single Family Home Lives with:: Self (pt reports daughter Lynelle Smoke is staying with her currently) Patient language and need for interpreter reviewed:: Yes Do you feel safe going back to the place where you live?: Yes      Need for Family Participation in Patient Care: No (Comment) Care giver support system in place?: Yes (comment) Current home services: Home PT ([facility]) Criminal Activity/Legal Involvement Pertinent to Current Situation/Hospitalization: No - Comment as needed  Activities of Daily Living Home Assistive Devices/Equipment: Cane (specify quad or straight), Walker (specify type), Eyeglasses, Dentures (specify type), Bedside commode/3-in-1    Permission Sought/Granted Permission sought to share information with : Family Supports Permission granted to share information with : Yes, Verbal Permission Granted  Share Information with NAME: daughter Lynelle Smoke, DIL Renee           Emotional Assessment Appearance:: Appears stated age Attitude/Demeanor/Rapport: Engaged Affect (typically observed): Appropriate, Pleasant Orientation: : Oriented to Self, Oriented to Place, Oriented to  Time, Oriented  to Situation      Admission diagnosis:  Periprosthetic fracture of patella [K93.8XXA, [place] Patient Active Problem List   Diagnosis Date Noted   Periprosthetic fracture of patella 09/21/2022   Peri-prosthetic patellar fracture 09/19/2022   S/P total knee arthroplasty, left 09/03/2022   Degenerative arthritis of left knee 01/18/2021   S/P reverse total shoulder arthroplasty, 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   Primary osteoarthritis of right hip 01/20/2018   Osteoarthritis of right knee 01/18/2018   Chronic pain syndrome    Anxiety    Right hip pain 11/29/2017   CKD (chronic kidney disease) stage 2, GFR 60-89  ml/min 11/29/2017   Prosthetic joint infection ([place])    Renal failure 05/27/2012   HTN (hypertension) 10/17/2011   Infected prosthetic knee joint ([place]) 09/10/2011    Class: Acute   PCP:  Corrington, Kip A, MD Pharmacy:   [id number] [place], [place] - [address] [place] [id number]: [phone]: [phone] [facility] [place]Phone: [phone]: [phone]    Social Determinants of Health (SDOH) Social History: [place] No Food Insecurity (09/03/2022)  Housing: Low Risk  (09/03/2022)  Transportation Needs: No Transportation Needs (09/03/2022)  Utilities: Not At Risk (09/03/2022)  Tobacco Use: Medium Risk (09/21/2022)   SDOH Interventions:     Readmission Risk Interventions     No data to display

## 2022-09-24 ENCOUNTER — Encounter (HOSPITAL_COMMUNITY): Payer: Self-pay | Admitting: Orthopedic Surgery

## 2022-09-24 MED ORDER — OXYCODONE HCL 5 MG PO TABS: ORAL_TABLET | ORAL | 0 refills | Status: DC

## 2022-09-24 NOTE — Progress Notes (Signed)
     [name] is a 89 y.o. female    Subjective: Patient states she feels well.  Pain controlled on current regimen.  Tolerating knee immobilizer.  Working with physical therapy.  Planning for SNF placement at discharge.  No new concerns or overnight events.  No family present at bedside.  Objectyive: Vitals:   09/24/22 0500 09/24/22 0740  BP: (!) 156/81 115/63  Pulse: 96 99  Resp: 18 18  Temp: 98.7 F (37.1 C) 98.5 F (36.9 C)  SpO2: 98% 96%     Exam: Awake and alert Respirations even and unlabored No acute distress Examination of the left lower extremity demonstrates appropriate placement of the knee immobilizer. It was gently opened. Dressing in place. Swelling appropriate. The leg is held in a neutral position. There is no deformity.  Did not assess for range of motion at the knee. Calfs and compartments are soft and nontender. The knee immobilizer is reapproximated.  He is able to plantarflex and dorsiflex the ankle without difficulty. She can wiggle the toes. Warm and well-perfused distally with intact sensation.    Assessment: Postop day 3 status post repair of left knee inferior pole patella fracture and MCL Status post left total knee arthroplasty on 09/03/2022     Plan: Patient doing well and suitable for discharge to SNF once placement obtained.  The patient appears to understand restrictions and is agreeable to SNF placement.     -Keep knee immobilizer intact at all times.  No range of motion at the knee.  Weightbearing as tolerated with the walker when strictly supervised by physical therapy.  Restrictions effective for 6 weeks. -Pain control as needed.  Well-controlled on current regimen.  Plan for oxycodone at discharge -Aspirin 81 mg twice daily for DVT prophylaxis    Follow-up with Dr. Mayer Camel outpatient at [place] 2 weeks from surgery for reevaluation.   Shahiem Bedwell J. Martinique, PA-C

## 2022-09-24 NOTE — TOC Progression Note (Addendum)
Transition of Care [facility]) - Progression Note    Patient Details  Name: [name] MRN: [id number] Date of Birth: 03-09-1932  Transition of Care [facility]) CM/SW [place], RN Phone Number: 09/24/2022, 2:32 PM  Clinical Narrative:    CM called and spoke with Elyse Hsu, CM at [facility] and the DON at the facility is currently reviewing the patient's clinicals for possible bed offer.  I called and spoke with the patient's son, Yevette Edwards by phone and gave him choice regarding SNF placement at the son chose - 1. [facility], 2. [place], and [place].  Insurance authorization will be started in [facility] tomorrow once the patient's bed offer has been confirmed at [facility] - [facility] I will follow up with the patient's son for 2nd or 3rd choice for placement.  09/24/2021 1502 - CM called and spoke with Elyse Hsu, CM at [facility] and the facility made a bed offer to the patient.  The son is aware and accepted the bed offer.  Insurance authorization will be started at the request of the patient's son.   Expected Discharge Plan: [place] Barriers to Discharge: Continued Medical Work up  Expected Discharge Plan and Services In-house Referral: Clinical Social Work   Post Acute Care Choice: [place] arrangements for the past 2 months: [place] Determinants of Health (SDOH) Interventions [place]: No Food Insecurity (09/03/2022)  Housing: Low Risk  (09/03/2022)  Transportation Needs: No Transportation Needs (09/03/2022)  Utilities: Not At Risk (09/03/2022)  Tobacco Use: Medium Risk (09/21/2022)    Readmission Risk Interventions     No data to display

## 2022-09-25 NOTE — Progress Notes (Signed)
PATIENT ID: [name]  MRN: [id number]  DOB/AGE:  [birth year] / 89 y.o.  4 Days Post-Op Procedure(s) (LRB): LEFT PATELLAR TENDON REPAIR (Left)    PROGRESS NOTE Subjective:   Patient is alert, oriented, no Nausea, no Vomiting, yes passing gas, no Bowel Movement. Taking PO well with pt up eating in room. Denies SOB, Chest or Calf Pain. Using Incentive Spirometer, PAS in place. Ambulate WBAT with knee immobilizer on at all times.  Pt walked 30 ft with therapy, Patient reports pain as 7 on 0-10 scale.     Objective: Vital signs in last 24 hours: Temp:  [97.7 F (36.5 C)-98.2 F (36.8 C)] 97.7 F (36.5 C) (01/02 0748) Pulse Rate:  [93-96] 93 (01/02 0748) Resp:  [17-18] 18 (01/02 0748) BP: (115-164)/(67-78) 164/74 (01/02 0748) SpO2:  [97 %-100 %] 100 % (01/02 0748)    Intake/Output from previous day: I/O last 3 completed shifts: In: 500 [P.O.:500] Out: -    Intake/Output this shift: No intake/output data recorded.   LABORATORY DATA: No results for input(s): "WBC", "HGB", "HCT", "PLT", "NA", "K", "CL", "CO2", "BUN", "CREATININE", "GLUCOSE", "GLUCAP", "INR", "CALCIUM" in the last 72 hours.  Invalid input(s): "PT", "2"  Examination: Neurologically intact ABD soft Neurovascular intact Sensation intact distally Intact pulses distally Dorsiflexion/Plantar flexion intact Incision: dressing C/D/I and no drainage No cellulitis present Compartment soft} Knee immobilizer removed and aquacel is clean dry and intact. Assessment:   4 Days Post-Op Procedure(s) (LRB): LEFT PATELLAR TENDON REPAIR (Left) ADDITIONAL DIAGNOSIS:  DM, HTN, LTKA, Skin CA of the face.   Plan:  Weight Bearing as Tolerated (WBAT) with knee immobilizer on at all times  DVT Prophylaxis:  Aspirin  DISCHARGE PLAN: Skilled Nursing Facility/Rehab when bed available  DISCHARGE NEEDS: HHPT     Joanell Rising 09/25/2022, 8:12 AM

## 2022-09-25 NOTE — Care Management Important Message (Signed)
Important Message  Patient Details  Name: [name] MRN: [id number] Date of Birth: 1931-12-14   Medicare Important Message Given:  Yes     [name] Montine Circle 09/25/2022, 1:44 PM

## 2022-09-25 NOTE — Progress Notes (Signed)
Report given via phone to [place] at [facility]. All questions answered. Patient's peripheral IV removed prior to discharge without complications.

## 2022-09-25 NOTE — Discharge Summary (Addendum)
Patient ID: MONTEEN TOOPS MRN: [id number] DOB/AGE: [birth year] 89 y.o.  Admit date: 09/21/2022 Discharge date: 09/25/2022  Admission Diagnoses:  Principal Problem:   Peri-prosthetic patellar fracture Active Problems:   Periprosthetic fracture of patella   Discharge Diagnoses:  Same  Past Medical History:  Diagnosis Date   Anxiety    Arthritis    Asthma    Cancer ([place])    SKIN CA OF FACE   Chronic kidney disease    Chronic pain    COVID    moderate case - had pneumonia   Depression    Diabetes mellitus without complication ([place])    Gallstones    GERD (gastroesophageal reflux disease)    Hyperlipidemia    Hypertension    Insomnia    Iron deficiency anemia    Peri-prosthetic patellar fracture 09/19/2022   Pneumonia    Prosthetic joint infection ([place])    RBBB 03/29/2022   TIA (transient ischemic attack)     Surgeries: Procedure(s): LEFT PATELLAR TENDON REPAIR on 09/21/2022   Consultants:   Discharged Condition: Improved  Hospital Course: Deserae Jennings Fasching is an 89 y.o. female who was admitted 09/21/2022 for operative treatment ofPeri-prosthetic patellar fracture. Patient has severe unremitting pain that affects sleep, daily activities, and work/hobbies. After pre-op clearance the patient was taken to the operating room on 09/21/2022 and underwent  Procedure(s): [place].    Patient was given perioperative antibiotics:  Anti-infectives (From admission, onward)    Start     Dose/Rate Route Frequency Ordered Stop   09/21/22 1830  ceFAZolin (ANCEF) IVPB 2g/100 mL premix        2 g 200 mL/hr over 30 Minutes Intravenous Every 6 hours 09/21/22 1657 09/22/22 0656   09/21/22 0930  ceFAZolin (ANCEF) IVPB 2g/100 mL premix        2 g 200 mL/hr over 30 Minutes Intravenous On call to O.R. 09/21/22 0917 09/21/22 1223        Patient was given sequential compression devices, early ambulation, and chemoprophylaxis to prevent DVT.  Patient benefited  maximally from hospital stay and there were no complications.    Recent vital signs: Patient Vitals for the past 24 hrs:  BP Temp Temp src Pulse Resp SpO2  09/25/22 0748 (!) 164/74 97.7 F (36.5 C) Oral 93 18 100 %  09/25/22 0445 (!) 150/74 97.7 F (36.5 C) Oral 93 17 99 %  09/24/22 2042 -- -- -- -- -- 98 %  09/24/22 1948 115/67 98 F (36.7 C) Oral 95 17 98 %  09/24/22 1546 131/78 98.2 F (36.8 C) Oral 96 18 97 %     Recent laboratory studies: No results for input(s): "WBC", "HGB", "HCT", "PLT", "NA", "K", "CL", "CO2", "BUN", "CREATININE", "GLUCOSE", "INR", "CALCIUM" in the last 72 hours.  Invalid input(s): "PT", "2"   Discharge Medications:   Allergies as of 09/25/2022       Reactions   Lisinopril Swelling   angioedema   Hydrochlorothiazide Other (See Comments)   UNSPECIFIED REACTION    Lipitor [atorvastatin Calcium] Other (See Comments)   UNSPECIFIED REACTION    Thiazide-type Diuretics Other (See Comments)   UNSPECIFIED REACTION    Zocor [simvastatin] Swelling   SWELLING REACTION UNSPECIFIED    Codeine Nausea Only        Medication List     STOP taking these medications    HYDROcodone-acetaminophen 5-325 MG tablet Commonly known as: NORCO/VICODIN   oxyCODONE-acetaminophen 5-325 MG tablet Commonly known as: PERCOCET/ROXICET  TAKE these medications    albuterol 108 (90 Base) MCG/ACT inhaler Commonly known as: VENTOLIN HFA Inhale 2 puffs into the lungs every 6 (six) hours as needed for shortness of breath or wheezing.   ALPRAZolam 0.5 MG tablet Commonly known as: XANAX Take 0.5 mg by mouth 2 (two) times daily as needed for anxiety.   ascorbic acid 500 MG tablet Commonly known as: VITAMIN C Take 500 mg by mouth daily.   aspirin EC 81 MG tablet Take 1 tablet (81 mg total) by mouth 2 (two) times daily.   atenolol 50 MG tablet Commonly known as: TENORMIN Take 1.5 tablets (75 mg total) by mouth daily. What changed: how much to take   busPIRone 5  MG tablet Commonly known as: BUSPAR Take 5 mg by mouth 2 (two) times daily.   carvedilol 25 MG tablet Commonly known as: COREG Take 25 mg by mouth 2 (two) times daily.   cloNIDine 0.2 MG tablet Commonly known as: CATAPRES Take 1 tablet (0.2 mg total) by mouth 2 (two) times daily.   diclofenac Sodium 1 % Gel Commonly known as: VOLTAREN Apply 2 g topically 4 (four) times daily as needed for pain.   ezetimibe 10 MG tablet Commonly known as: ZETIA Take 10 mg by mouth daily.   fluticasone 110 MCG/ACT inhaler Commonly known as: FLOVENT HFA Inhale 1 puff into the lungs daily as needed (asthma).   furosemide 40 MG tablet Commonly known as: LASIX Take 1 tablet (40 mg total) by mouth daily. What changed: how much to take   gabapentin 300 MG capsule Commonly known as: NEURONTIN Take 1 capsule (300 mg total) by mouth 3 (three) times daily. What changed: when to take this   gabapentin 300 MG capsule Commonly known as: NEURONTIN Take 300 mg by mouth daily. What changed: Another medication with the same name was changed. Make sure you understand how and when to take each.   hydrALAZINE 25 MG tablet Commonly known as: APRESOLINE Take 12.5 mg by mouth 4 (four) times daily.   Multivitamin Womens 50+ Adv Tabs Take 1 tablet by mouth daily.   naloxone 4 MG/0.1ML Liqd nasal spray kit Commonly known as: NARCAN   oxyCODONE 5 MG immediate release tablet Commonly known as: Roxicodone Take 1 tablet by mouth every 4-6 hours as needed for post op pain   pantoprazole 40 MG tablet Commonly known as: PROTONIX Take 40 mg by mouth daily.   SUPER B COMPLEX PO Take 1 tablet by mouth daily.   tiZANidine 2 MG tablet Commonly known as: ZANAFLEX Take 1 tablet (2 mg total) by mouth every 6 (six) hours as needed for muscle spasms.   zolpidem 5 MG tablet Commonly known as: AMBIEN Take 5 mg by mouth at bedtime.               Discharge Care Instructions  (From admission, onward)            Start     Ordered   09/25/22 0000  Weight bearing as tolerated       Comments: Maintain knee immobilizer at all times   09/25/22 0828            Diagnostic Studies: DG Knee Left Port  Result Date: 09/21/2022 CLINICAL DATA:  Patellar fracture. EXAM: PORTABLE LEFT KNEE - 1-2 VIEW COMPARISON:  None Available. FINDINGS: Status post left total knee arthroplasty. Moderately displaced fracture is seen involving the inferior aspect of the patella at the margin of the prosthesis. Expected  postoperative changes are seen involving the soft tissues anteriorly. IMPRESSION: Status post left total knee arthroplasty. Moderately displaced fracture involving inferior aspect of patella. Electronically Signed   By: Marijo Conception M.D.   On: 09/21/2022 16:12    Disposition: Discharge disposition: 03-Skilled Nursing Facility       Discharge Instructions     Call MD / Call 911   Complete by: As directed    If you experience chest pain or shortness of breath, CALL 911 and be transported to the hospital emergency room.  If you develope a fever above 101 F, pus (white drainage) or increased drainage or redness at the wound, or calf pain, call your surgeon's office.   Constipation Prevention   Complete by: As directed    Drink plenty of fluids.  Prune juice may be helpful.  You may use a stool softener, such as Colace (over the counter) 100 mg twice a day.  Use MiraLax (over the counter) for constipation as needed.   Diet - low sodium heart healthy   Complete by: As directed    Driving restrictions   Complete by: As directed    No driving for 2 weeks   Increase activity slowly as tolerated   Complete by: As directed    Patient may shower   Complete by: As directed    You may shower without a dressing once there is no drainage.  Do not wash over the wound.  If drainage remains, cover wound with plastic wrap and then shower.   Post-operative opioid taper instructions:   Complete by: As  directed    POST-OPERATIVE OPIOID TAPER INSTRUCTIONS: It is important to wean off of your opioid medication as soon as possible. If you do not need pain medication after your surgery it is ok to stop day one. Opioids include: Codeine, Hydrocodone(Norco, Vicodin), Oxycodone(Percocet, oxycontin) and hydromorphone amongst others.  Long term and even short term use of opiods can cause: Increased pain response Dependence Constipation Depression Respiratory depression And more.  Withdrawal symptoms can include Flu like symptoms Nausea, vomiting And more Techniques to manage these symptoms Hydrate well Eat regular healthy meals Stay active Use relaxation techniques(deep breathing, meditating, yoga) Do Not substitute Alcohol to help with tapering If you have been on opioids for less than two weeks and do not have pain than it is ok to stop all together.  Plan to wean off of opioids This plan should start within one week post op of your joint replacement. Maintain the same interval or time between taking each dose and first decrease the dose.  Cut the total daily intake of opioids by one tablet each day Next start to increase the time between doses. The last dose that should be eliminated is the evening dose.      Weight bearing as tolerated   Complete by: As directed    Maintain knee immobilizer at all times        Follow-up Information     Frederik Pear, MD Follow up in 2 week(s).   Specialty: Orthopedic Surgery Contact information: [place] [address] [phone]                  Signed: Joanell Rising 09/25/2022, 8:29 AM

## 2022-09-25 NOTE — TOC Transition Note (Signed)
Transition of Care [facility]) - CM/SW Discharge Note   Patient Details  Name: [name] MRN: [id number] Date of Birth: Mar 20, 1932  [facility]) CM/SW Contact:  Curlene Labrum, RN Phone Number: 09/25/2022, 9:57 AM   Clinical Narrative:    CM called and spoke with [facility] ([facility]) and the patient has been approved for SNF placement.  I called Elyse Hsu, CM at the facility and the facility is able to accept her this morning.   I called and updated the patient's son and patient regarding her discharge to the facility today.  [facility] ambulance transport will be set up this morning.  Bedside nursing - please call report to [facility] at [phone].     Barriers to Discharge: Continued Medical Work up   Patient Goals and CMS Choice      Discharge Placement                         Discharge Plan and Services Additional resources added to the After Visit Summary for   In-house Referral: Clinical Social Work   Post Acute Care Choice: Home Health                               Social Determinants of Health (SDOH) Interventions SDOH Screenings   Food Insecurity: No Food Insecurity (09/03/2022)  Housing: Low Risk  (09/03/2022)  Transportation Needs: No Transportation Needs (09/03/2022)  Utilities: Not At Risk (09/03/2022)  Tobacco Use: Medium Risk (09/24/2022)     Readmission Risk Interventions    09/25/2022    9:57 AM  Readmission Risk Prevention Plan  Post Dischage Appt Complete  Medication Screening Complete  Transportation Screening Complete

## 2022-09-25 NOTE — Care Management Important Message (Signed)
Important Message  Patient Details  Name: [name] MRN: [id number] Date of Birth: Dec 10, 1931   Medicare Important Message Given:  Yes Patient left prior to IM delivery will mail to the patient home address.    Lynita Groseclose 09/25/2022, 1:49 PM

## 2022-09-25 NOTE — Progress Notes (Signed)
Physical Therapy Treatment Patient Details Name: [name] MRN: [id number] DOB: [birth year] Today's Date: 09/25/2022   History of Present Illness Pt is an 89 y/o female admitted 09/21/22 for patellar fx and MCL repair same date. Pt with recent Lt TKA 12/11 and fall 12/13 resulting in fx. PMHx:  DM, HTN, LTKA, Skin CA of the face.    PT Comments    Pt able to progress to walking in room this session, using BSC and performing increased sit to stand trials. Pt aware of KI at all times with KI in place throughout session and ABD pad to posterior brace at calf for pressure relief of skin.  Will continue to follow.    Recommendations for follow up therapy are one component of a multi-disciplinary discharge planning process, led by the attending physician.  Recommendations may be updated based on patient status, additional functional criteria and insurance authorization.  Follow Up Recommendations  Skilled nursing-short term rehab (<3 hours/day) Can patient physically be transported by private vehicle: Yes   Assistance Recommended at Discharge Intermittent Supervision/Assistance  Patient can return home with the following A little help with walking and/or transfers;A little help with bathing/dressing/bathroom;Assistance with cooking/housework;Assist for transportation;Help with stairs or ramp for entrance   Equipment Recommendations  None recommended by PT    Recommendations for Other Services       Precautions / Restrictions Precautions Precautions: Fall Required Braces or Orthoses: Knee Immobilizer - Left Knee Immobilizer - Left: On at all times Restrictions LLE Weight Bearing: Weight bearing as tolerated     Mobility  Bed Mobility Overal bed mobility: Modified Independent             General bed mobility comments: use of rail with HOB 15 degrees    Transfers Overall transfer level: Needs assistance   Transfers: Sit to/from Stand Sit to Stand: Min assist Stand  pivot transfers: Min guard         General transfer comment: min assist to rise from bed, bSC and recliner x 4 trials with cues for hand placement and LLE placement    Ambulation/Gait Ambulation/Gait assistance: Min guard Gait Distance (Feet): 30 Feet Assistive device: Rolling walker (2 wheels) Gait Pattern/deviations: Step-to pattern   Gait velocity interpretation: <1.8 ft/sec, indicate of risk for recurrent falls   General Gait Details: cues for posture and proximity, reliance on RW with steady gait and pt able to self-regulate distance   Stairs             Wheelchair Mobility    Modified Rankin (Stroke Patients Only)       Balance Overall balance assessment: Needs assistance   Sitting balance-Leahy Scale: Good     Standing balance support: Reliant on assistive device for balance, Bilateral upper extremity supported, During functional activity Standing balance-Leahy Scale: Poor Standing balance comment: reliant on RW in standing                            Cognition Arousal/Alertness: Awake/alert Behavior During Therapy: WFL for tasks assessed/performed Overall Cognitive Status: Within Functional Limits for tasks assessed                                          Exercises      General Comments        Pertinent Vitals/Pain Pain Assessment Pain Score: 5  Pain Location: L knee Pain Descriptors / Indicators: Aching Pain Intervention(s): Limited activity within patient's tolerance, Repositioned, Monitored during session    Home Living                          Prior Function            PT Goals (current goals can now be found in the care plan section) Progress towards PT goals: Progressing toward goals    Frequency    Min 3X/week      PT Plan Current plan remains appropriate    Co-evaluation              AM-PAC PT "6 Clicks" Mobility   Outcome Measure  Help needed turning from your back  to your side while in a flat bed without using bedrails?: None Help needed moving from lying on your back to sitting on the side of a flat bed without using bedrails?: A Little Help needed moving to and from a bed to a chair (including a wheelchair)?: A Little Help needed standing up from a chair using your arms (e.g., wheelchair or bedside chair)?: A Little Help needed to walk in hospital room?: A Little Help needed climbing 3-5 steps with a railing? : A Lot 6 Click Score: 18    End of Session Equipment Utilized During Treatment: Left knee immobilizer Activity Tolerance: Patient tolerated treatment well Patient left: in chair;with call bell/phone within reach;with chair alarm set Nurse Communication: Mobility status;Weight bearing status PT Visit Diagnosis: Other abnormalities of gait and mobility (R26.89);Pain;History of falling (Z91.81) Pain - Right/Left: Left Pain - part of body: Knee     Time: 2458-0998 PT Time Calculation (min) (ACUTE ONLY): 18 min  Charges:  $Gait Training: 8-22 mins                     Bayard Males, PT Acute Rehabilitation Services Office: [place] 09/25/2022, 7:52 AM

## 2023-02-28 ENCOUNTER — Emergency Department (HOSPITAL_COMMUNITY): Payer: Medicare Other

## 2023-02-28 ENCOUNTER — Inpatient Hospital Stay (HOSPITAL_COMMUNITY)
Admission: EM | Admit: 2023-02-28 | Discharge: 2023-03-05 | DRG: 682 | Disposition: A | Discharge status: Skilled Nursing Facility | Payer: Medicare Other | Attending: Internal Medicine | Admitting: Internal Medicine

## 2023-02-28 ENCOUNTER — Encounter (HOSPITAL_COMMUNITY): Payer: Self-pay

## 2023-02-28 ENCOUNTER — Other Ambulatory Visit: Payer: Self-pay

## 2023-02-28 DIAGNOSIS — Z66 Do not resuscitate: Secondary | ICD-10-CM | POA: Diagnosis present

## 2023-02-28 DIAGNOSIS — M19012 Primary osteoarthritis, left shoulder: Secondary | ICD-10-CM | POA: Diagnosis present

## 2023-02-28 DIAGNOSIS — F0154 Vascular dementia, unspecified severity, with anxiety: Secondary | ICD-10-CM | POA: Diagnosis present

## 2023-02-28 DIAGNOSIS — E1122 Type 2 diabetes mellitus with diabetic chronic kidney disease: Secondary | ICD-10-CM | POA: Diagnosis present

## 2023-02-28 DIAGNOSIS — F419 Anxiety disorder, unspecified: Secondary | ICD-10-CM

## 2023-02-28 DIAGNOSIS — J45909 Unspecified asthma, uncomplicated: Secondary | ICD-10-CM | POA: Diagnosis present

## 2023-02-28 DIAGNOSIS — R531 Weakness: Secondary | ICD-10-CM

## 2023-02-28 DIAGNOSIS — I480 Paroxysmal atrial fibrillation: Secondary | ICD-10-CM | POA: Diagnosis present

## 2023-02-28 DIAGNOSIS — Z833 Family history of diabetes mellitus: Secondary | ICD-10-CM | POA: Diagnosis not present

## 2023-02-28 DIAGNOSIS — E278 Other specified disorders of adrenal gland: Secondary | ICD-10-CM

## 2023-02-28 DIAGNOSIS — E860 Dehydration: Secondary | ICD-10-CM | POA: Diagnosis present

## 2023-02-28 DIAGNOSIS — M158 Other polyosteoarthritis: Secondary | ICD-10-CM

## 2023-02-28 DIAGNOSIS — N184 Chronic kidney disease, stage 4 (severe): Secondary | ICD-10-CM | POA: Diagnosis present

## 2023-02-28 DIAGNOSIS — M1990 Unspecified osteoarthritis, unspecified site: Secondary | ICD-10-CM | POA: Diagnosis present

## 2023-02-28 DIAGNOSIS — Z1152 Encounter for screening for COVID-19: Secondary | ICD-10-CM | POA: Diagnosis not present

## 2023-02-28 DIAGNOSIS — I10 Essential (primary) hypertension: Secondary | ICD-10-CM | POA: Diagnosis not present

## 2023-02-28 DIAGNOSIS — F32A Depression, unspecified: Secondary | ICD-10-CM | POA: Diagnosis present

## 2023-02-28 DIAGNOSIS — Z96653 Presence of artificial knee joint, bilateral: Secondary | ICD-10-CM | POA: Diagnosis present

## 2023-02-28 DIAGNOSIS — I129 Hypertensive chronic kidney disease with stage 1 through stage 4 chronic kidney disease, or unspecified chronic kidney disease: Secondary | ICD-10-CM | POA: Diagnosis present

## 2023-02-28 DIAGNOSIS — Z79899 Other long term (current) drug therapy: Secondary | ICD-10-CM

## 2023-02-28 DIAGNOSIS — Z85828 Personal history of other malignant neoplasm of skin: Secondary | ICD-10-CM

## 2023-02-28 DIAGNOSIS — R627 Adult failure to thrive: Secondary | ICD-10-CM | POA: Diagnosis present

## 2023-02-28 DIAGNOSIS — Z96643 Presence of artificial hip joint, bilateral: Secondary | ICD-10-CM | POA: Diagnosis present

## 2023-02-28 DIAGNOSIS — Z7982 Long term (current) use of aspirin: Secondary | ICD-10-CM

## 2023-02-28 DIAGNOSIS — K219 Gastro-esophageal reflux disease without esophagitis: Secondary | ICD-10-CM | POA: Diagnosis present

## 2023-02-28 DIAGNOSIS — I639 Cerebral infarction, unspecified: Secondary | ICD-10-CM | POA: Diagnosis not present

## 2023-02-28 DIAGNOSIS — N179 Acute kidney failure, unspecified: Principal | ICD-10-CM

## 2023-02-28 DIAGNOSIS — M6282 Rhabdomyolysis: Secondary | ICD-10-CM | POA: Diagnosis present

## 2023-02-28 DIAGNOSIS — G9341 Metabolic encephalopathy: Secondary | ICD-10-CM | POA: Diagnosis present

## 2023-02-28 DIAGNOSIS — R197 Diarrhea, unspecified: Secondary | ICD-10-CM | POA: Diagnosis present

## 2023-02-28 DIAGNOSIS — M19011 Primary osteoarthritis, right shoulder: Secondary | ICD-10-CM | POA: Diagnosis present

## 2023-02-28 DIAGNOSIS — Z87891 Personal history of nicotine dependence: Secondary | ICD-10-CM | POA: Diagnosis not present

## 2023-02-28 DIAGNOSIS — E669 Obesity, unspecified: Secondary | ICD-10-CM | POA: Diagnosis present

## 2023-02-28 DIAGNOSIS — N189 Chronic kidney disease, unspecified: Secondary | ICD-10-CM | POA: Diagnosis not present

## 2023-02-28 DIAGNOSIS — Z515 Encounter for palliative care: Secondary | ICD-10-CM | POA: Diagnosis not present

## 2023-02-28 DIAGNOSIS — I4891 Unspecified atrial fibrillation: Secondary | ICD-10-CM | POA: Diagnosis not present

## 2023-02-28 DIAGNOSIS — Z888 Allergy status to other drugs, medicaments and biological substances status: Secondary | ICD-10-CM

## 2023-02-28 DIAGNOSIS — Z8673 Personal history of transient ischemic attack (TIA), and cerebral infarction without residual deficits: Secondary | ICD-10-CM

## 2023-02-28 DIAGNOSIS — N949 Unspecified condition associated with female genital organs and menstrual cycle: Secondary | ICD-10-CM | POA: Diagnosis not present

## 2023-02-28 DIAGNOSIS — E782 Mixed hyperlipidemia: Secondary | ICD-10-CM

## 2023-02-28 DIAGNOSIS — F05 Delirium due to known physiological condition: Secondary | ICD-10-CM | POA: Diagnosis not present

## 2023-02-28 DIAGNOSIS — Z7189 Other specified counseling: Secondary | ICD-10-CM

## 2023-02-28 DIAGNOSIS — N1832 Chronic kidney disease, stage 3b: Secondary | ICD-10-CM | POA: Diagnosis not present

## 2023-02-28 DIAGNOSIS — Z885 Allergy status to narcotic agent status: Secondary | ICD-10-CM

## 2023-02-28 DIAGNOSIS — M170 Bilateral primary osteoarthritis of knee: Secondary | ICD-10-CM | POA: Diagnosis present

## 2023-02-28 DIAGNOSIS — Z6837 Body mass index (BMI) 37.0-37.9, adult: Secondary | ICD-10-CM

## 2023-02-28 DIAGNOSIS — E875 Hyperkalemia: Secondary | ICD-10-CM | POA: Diagnosis present

## 2023-02-28 DIAGNOSIS — Z7951 Long term (current) use of inhaled steroids: Secondary | ICD-10-CM

## 2023-02-28 DIAGNOSIS — Z96611 Presence of right artificial shoulder joint: Secondary | ICD-10-CM | POA: Diagnosis present

## 2023-02-28 DIAGNOSIS — L8990 Pressure ulcer of unspecified site, unspecified stage: Secondary | ICD-10-CM | POA: Insufficient documentation

## 2023-02-28 LAB — COMPREHENSIVE METABOLIC PANEL
ALT: 372 U/L — ABNORMAL HIGH (ref 0–44)
AST: 240 U/L — ABNORMAL HIGH (ref 15–41)
Albumin: 3.3 g/dL — ABNORMAL LOW (ref 3.5–5.0)
Alkaline Phosphatase: 67 U/L (ref 38–126)
Anion gap: 12 (ref 5–15)
BUN: 92 mg/dL — ABNORMAL HIGH (ref 8–23)
CO2: 20 mmol/L — ABNORMAL LOW (ref 22–32)
Calcium: 8.6 mg/dL — ABNORMAL LOW (ref 8.9–10.3)
Chloride: 104 mmol/L (ref 98–111)
Creatinine, Ser: 3.62 mg/dL — ABNORMAL HIGH (ref 0.44–1.00)
GFR, Estimated: 12 mL/min — ABNORMAL LOW (ref >60)
Glucose, Bld: 140 mg/dL — ABNORMAL HIGH (ref 70–99)
Potassium: 5.2 mmol/L — ABNORMAL HIGH (ref 3.5–5.1)
Sodium: 136 mmol/L (ref 135–145)
Total Bilirubin: 0.6 mg/dL (ref 0.3–1.2)
Total Protein: 7.1 g/dL (ref 6.5–8.1)

## 2023-02-28 LAB — CBG MONITORING, ED: Glucose-Capillary: 137 mg/dL — ABNORMAL HIGH (ref 70–99)

## 2023-02-28 LAB — CK: Total CK: 978 U/L — ABNORMAL HIGH (ref 38–234)

## 2023-02-28 LAB — MRSA NEXT GEN BY PCR, NASAL: MRSA by PCR Next Gen: DETECTED — AB

## 2023-02-28 LAB — URINALYSIS, W/ REFLEX TO CULTURE (INFECTION SUSPECTED)
Bacteria, UA: NONE SEEN
Bilirubin Urine: NEGATIVE
Glucose, UA: NEGATIVE mg/dL
Hgb urine dipstick: NEGATIVE
Ketones, ur: NEGATIVE mg/dL
Leukocytes,Ua: NEGATIVE
Nitrite: NEGATIVE
Protein, ur: 30 mg/dL — AB
Specific Gravity, Urine: 1.015 (ref 1.005–1.030)
pH: 5 (ref 5.0–8.0)

## 2023-02-28 LAB — CBC WITH DIFFERENTIAL/PLATELET
Abs Immature Granulocytes: 0.08 10*3/uL — ABNORMAL HIGH (ref 0.00–0.07)
Basophils Absolute: 0 10*3/uL (ref 0.0–0.1)
Basophils Relative: 0 %
Eosinophils Absolute: 0 10*3/uL (ref 0.0–0.5)
Eosinophils Relative: 0 %
HCT: 34.8 % — ABNORMAL LOW (ref 36.0–46.0)
Hemoglobin: 10.9 g/dL — ABNORMAL LOW (ref 12.0–15.0)
Immature Granulocytes: 1 %
Lymphocytes Relative: 6 %
Lymphs Abs: 0.7 10*3/uL (ref 0.7–4.0)
MCH: 29.6 pg (ref 26.0–34.0)
MCHC: 31.3 g/dL (ref 30.0–36.0)
MCV: 94.6 fL (ref 80.0–100.0)
Monocytes Absolute: 0.9 10*3/uL (ref 0.1–1.0)
Monocytes Relative: 8 %
Neutro Abs: 9.6 10*3/uL — ABNORMAL HIGH (ref 1.7–7.7)
Neutrophils Relative %: 85 %
Platelets: 233 10*3/uL (ref 150–400)
RBC: 3.68 MIL/uL — ABNORMAL LOW (ref 3.87–5.11)
RDW: 15 % (ref 11.5–15.5)
WBC: 11.2 10*3/uL — ABNORMAL HIGH (ref 4.0–10.5)
nRBC: 0 % (ref 0.0–0.2)

## 2023-02-28 LAB — TROPONIN I (HIGH SENSITIVITY)
Troponin I (High Sensitivity): 25 ng/L — ABNORMAL HIGH (ref <18)
Troponin I (High Sensitivity): 21 ng/L — ABNORMAL HIGH (ref <18)

## 2023-02-28 LAB — PHOSPHORUS: Phosphorus: 4.8 mg/dL — ABNORMAL HIGH (ref 2.5–4.6)

## 2023-02-28 LAB — LACTIC ACID, PLASMA: Lactic Acid, Venous: 1 mmol/L (ref 0.5–1.9)

## 2023-02-28 LAB — TSH: TSH: 0.72 u[IU]/mL (ref 0.350–4.500)

## 2023-02-28 LAB — MAGNESIUM: Magnesium: 2.9 mg/dL — ABNORMAL HIGH (ref 1.7–2.4)

## 2023-02-28 LAB — LIPASE, BLOOD: Lipase: 24 U/L (ref 11–51)

## 2023-02-28 LAB — SARS CORONAVIRUS 2 BY RT PCR: SARS Coronavirus 2 by RT PCR: NEGATIVE

## 2023-02-28 MED ORDER — CLONIDINE HCL 0.2 MG PO TABS
0.2000 mg | ORAL_TABLET | Freq: Two times a day (BID) | ORAL | Status: DC
  Administered 2023-02-28 – 2023-03-03 (×6): 0.2 mg via ORAL
  Filled 2023-02-28 (×6): qty 1

## 2023-02-28 MED ORDER — ACETAMINOPHEN 650 MG RE SUPP: 650.0000 mg | Freq: Four times a day (QID) | RECTAL | Status: DC | PRN

## 2023-02-28 MED ORDER — ASPIRIN 81 MG PO TBEC
81.0000 mg | DELAYED_RELEASE_TABLET | Freq: Two times a day (BID) | ORAL | Status: DC
  Administered 2023-02-28 – 2023-03-05 (×10): 81 mg via ORAL
  Filled 2023-02-28 (×10): qty 1

## 2023-02-28 MED ORDER — HYDRALAZINE HCL 25 MG PO TABS
12.5000 mg | ORAL_TABLET | Freq: Three times a day (TID) | ORAL | Status: DC
  Administered 2023-02-28 – 2023-03-02 (×5): 12.5 mg via ORAL
  Filled 2023-02-28 (×5): qty 1

## 2023-02-28 MED ORDER — HEPARIN SODIUM (PORCINE) 5000 UNIT/ML IJ SOLN
5000.0000 [IU] | Freq: Three times a day (TID) | INTRAMUSCULAR | Status: DC
  Administered 2023-02-28 – 2023-03-05 (×15): 5000 [IU] via SUBCUTANEOUS
  Filled 2023-02-28 (×14): qty 1

## 2023-02-28 MED ORDER — SODIUM CHLORIDE 0.9 % IV SOLN: INTRAVENOUS | Status: DC

## 2023-02-28 MED ORDER — BUSPIRONE HCL 5 MG PO TABS
5.0000 mg | ORAL_TABLET | Freq: Two times a day (BID) | ORAL | Status: DC
  Administered 2023-02-28 – 2023-03-05 (×10): 5 mg via ORAL
  Filled 2023-02-28 (×10): qty 1

## 2023-02-28 MED ORDER — HYDROMORPHONE HCL 1 MG/ML IJ SOLN
0.5000 mg | Freq: Four times a day (QID) | INTRAMUSCULAR | Status: DC | PRN
  Administered 2023-03-03 (×2): 0.5 mg via INTRAVENOUS
  Filled 2023-02-28 (×2): qty 0.5

## 2023-02-28 MED ORDER — SODIUM CHLORIDE 0.9 % IV BOLUS
1000.0000 mL | Freq: Once | INTRAVENOUS | Status: AC
  Administered 2023-02-28: 1000 mL via INTRAVENOUS

## 2023-02-28 MED ORDER — ENSURE ENLIVE PO LIQD
237.0000 mL | Freq: Two times a day (BID) | ORAL | Status: DC
  Administered 2023-02-28 – 2023-03-05 (×9): 237 mL via ORAL

## 2023-02-28 MED ORDER — PANTOPRAZOLE SODIUM 40 MG PO TBEC
40.0000 mg | DELAYED_RELEASE_TABLET | Freq: Every day | ORAL | Status: DC
  Administered 2023-03-01 – 2023-03-05 (×5): 40 mg via ORAL
  Filled 2023-02-28 (×5): qty 1

## 2023-02-28 MED ORDER — ACETAMINOPHEN 325 MG PO TABS
650.0000 mg | ORAL_TABLET | Freq: Four times a day (QID) | ORAL | Status: DC | PRN
  Administered 2023-03-02 – 2023-03-03 (×3): 650 mg via ORAL
  Filled 2023-02-28 (×3): qty 2

## 2023-02-28 MED ORDER — ONDANSETRON HCL 4 MG/2ML IJ SOLN: 4.0000 mg | Freq: Four times a day (QID) | INTRAMUSCULAR | Status: DC | PRN

## 2023-02-28 MED ORDER — METHOCARBAMOL 500 MG PO TABS: 500.0000 mg | ORAL_TABLET | Freq: Three times a day (TID) | ORAL | Status: DC | PRN

## 2023-02-28 MED ORDER — CARVEDILOL 12.5 MG PO TABS
25.0000 mg | ORAL_TABLET | Freq: Two times a day (BID) | ORAL | Status: DC
  Administered 2023-02-28 – 2023-03-03 (×6): 25 mg via ORAL
  Filled 2023-02-28 (×5): qty 2

## 2023-02-28 MED ORDER — OXYCODONE HCL 5 MG PO TABS
5.0000 mg | ORAL_TABLET | Freq: Four times a day (QID) | ORAL | Status: DC | PRN
  Administered 2023-03-03 – 2023-03-04 (×2): 5 mg via ORAL
  Filled 2023-02-28 (×2): qty 1

## 2023-02-28 MED ORDER — GABAPENTIN 300 MG PO CAPS
300.0000 mg | ORAL_CAPSULE | Freq: Every day | ORAL | Status: DC
  Administered 2023-03-01 – 2023-03-05 (×5): 300 mg via ORAL
  Filled 2023-02-28 (×5): qty 1

## 2023-02-28 MED ORDER — ONDANSETRON HCL 4 MG PO TABS
4.0000 mg | ORAL_TABLET | Freq: Four times a day (QID) | ORAL | Status: DC | PRN
  Administered 2023-03-04: 4 mg via ORAL
  Filled 2023-02-28: qty 1

## 2023-02-28 MED ORDER — EZETIMIBE 10 MG PO TABS
10.0000 mg | ORAL_TABLET | Freq: Every day | ORAL | Status: DC
  Administered 2023-03-01 – 2023-03-05 (×5): 10 mg via ORAL
  Filled 2023-02-28 (×6): qty 1

## 2023-02-28 MED ORDER — ALPRAZOLAM 0.25 MG PO TABS
0.2500 mg | ORAL_TABLET | Freq: Two times a day (BID) | ORAL | Status: DC | PRN
  Administered 2023-03-03 (×2): 0.25 mg via ORAL
  Filled 2023-02-28 (×2): qty 1

## 2023-02-28 NOTE — Assessment & Plan Note (Signed)
-  Continue the use of BuSpar and as needed Xanax.

## 2023-02-28 NOTE — Consult Note (Signed)
Consultation Note Date: 02/28/2023   Patient Name: [name]  DOB: 1932/01/21  MRN: [id number]  Age / Sex: 89 y.o., female  PCP: Corrington, Meredith Mody, MD Referring Physician: Vassie Loll, MD  Reason for Consultation: Establishing goals of care  HPI/Patient Profile: 89 y.o. female  with past medical history of garnered from 02/05/2023 office visit with [facility] Kipp Corrington, DM2, CKD 4, GERD, HTN/HLD, left hip replacement, right knee replacement, right bundle branch block 2013, iron deficiency anemia, ulcers, admitted on 02/28/2023 with weakness.   Clinical Assessment and Goals of Care: I have reviewed medical records including EPIC notes, labs and imaging, received report from RN, assessed the patient.  Attempt to see [name] in the ED but she has been transferred to the floor.  I seen [name] in her room.  She is lying quietly in bed.  She appears acutely/chronically ill and quite frail.  She is resting with her eyes closed.  She is oriented to self only.  Bedside nursing staff is present attending to needs.  When asked who I should call about her care [name] states, "my son Thereasa Distance".  Per ED provider note patient was brought by EMS from home where she lives with her daughter in a very dirty gown in a house that was very unkempt.  She is reportedly not gotten up from the couch for the last few days, not eating or drinking, endorses pain in the hip and shoulder due to her recent fall.  Per nursing staff notes bedbugs present, patient states that she is aware of bedbugs in her residence.  Call to son, [name], to discuss diagnosis prognosis, GOC, EOL wishes, disposition and options.  No answer, left voicemail message.  Conference with attending, bedside nursing staff, transition of care team related to patient condition, needs, goals of care.    HCPOA   NEXT OF KIN  -when asked who to call about her health concerns, [name] names her son, [name].    SUMMARY OF RECOMMENDATIONS   At this point continue to treat the treatable. Needs CODE STATUS/goals of care discussions.   Code Status/Advance Care Planning: Full code -by default.  Unable to speak with patient due to orientation level.  Unable to reach family  Symptom Management:  Per hospitalist, no additional needs at this time.  Palliative Prophylaxis:  Frequent Pain Assessment, Oral Care, and Turn Reposition  Additional Recommendations (Limitations, Scope, Preferences): Full Scope Treatment  Psycho-social/Spiritual:  Desire for further Chaplaincy support:no Additional Recommendations: Caregiving  Support/Resources  Prognosis:  Unable to determine, based on outcomes.  Discharge Planning: To Be Determined      Primary Diagnoses: Present on Admission:  Acute on chronic renal failure (HCC)   I have reviewed the medical record, interviewed the patient and family, and examined the patient. The following aspects are pertinent.  Past Medical History:  Diagnosis Date   Anxiety    Arthritis    Asthma    Cancer (HCC)    SKIN CA  OF FACE   Chronic kidney disease    Chronic pain    COVID    moderate case - had pneumonia   Depression    Diabetes mellitus without complication (HCC)    Gallstones    GERD (gastroesophageal reflux disease)    Hyperlipidemia    Hypertension    Insomnia    Iron deficiency anemia    Peri-prosthetic patellar fracture 09/19/2022   Pneumonia    Prosthetic joint infection (HCC)    RBBB 03/29/2022   TIA (transient ischemic attack)    Social History   Socioeconomic History   Marital status: Widowed    Spouse name: Not on file   Number of children: Not on file   Years of education: Not on file   Highest education level: Not on file  Occupational History   Not on file  Tobacco Use   Smoking status: Former    Packs/day: 0.25    Years:  10.00    Additional pack years: 0.00    Total pack years: 2.50    Types: Cigarettes    Quit date: 47    Years since quitting: 44.4   Smokeless tobacco: Never   Tobacco comments:    Quit 50 years ago  Vaping Use   Vaping Use: Never used  Substance and Sexual Activity   Alcohol use: No   Drug use: No   Sexual activity: Not Currently  Other Topics Concern   Not on file  Social History Narrative   Not on file   Social Determinants of Health   Financial Resource Strain: Not on file  Food Insecurity: No Food Insecurity (09/03/2022)   Hunger Vital Sign    Worried About Running Out of Food in the Last Year: Never true    Ran Out of Food in the Last Year: Never true  Transportation Needs: No Transportation Needs (09/03/2022)   PRAPARE - Administrator, Civil Service (Medical): No    Lack of Transportation (Non-Medical): No  Physical Activity: Not on file  Stress: Not on file  Social Connections: Not on file   Family History  Problem Relation Age of Onset   Diabetes Mellitus II Daughter    Scheduled Meds:  heparin injection (subcutaneous)  5,000 Units Subcutaneous Q8H   Continuous Infusions:  sodium chloride     PRN Meds:. Medications Prior to Admission:  Prior to Admission medications   Medication Sig Start Date End Date Taking? Authorizing Provider  albuterol (PROVENTIL HFA;VENTOLIN HFA) 108 (90 BASE) MCG/ACT inhaler Inhale 2 puffs into the lungs every 6 (six) hours as needed for shortness of breath or wheezing.    [provider]  ALPRAZolam Prudy Feeler) 0.5 MG tablet Take 0.5 mg by mouth 2 (two) times daily as needed for anxiety.    [provider]  aspirin EC 81 MG tablet Take 1 tablet (81 mg total) by mouth 2 (two) times daily. 09/03/22   Allena Katz, PA-C  atenolol (TENORMIN) 50 MG tablet Take 1.5 tablets (75 mg total) by mouth daily. Patient taking differently: Take 50 mg by mouth daily. 12/01/17   Rodolph Bong, MD  B Complex-C  (SUPER B COMPLEX PO) Take 1 tablet by mouth daily.    [provider]  busPIRone (BUSPAR) 5 MG tablet Take 5 mg by mouth 2 (two) times daily. 02/20/22   [provider]  carvedilol (COREG) 25 MG tablet Take 25 mg by mouth 2 (two) times daily. 01/17/22   [provider]  cloNIDine (CATAPRES) 0.2 MG tablet Take 1 tablet (0.2 mg total) by mouth 2 (two) times daily. 12/01/17   Rodolph Bong, MD  diclofenac Sodium (VOLTAREN) 1 % GEL Apply 2 g topically 4 (four) times daily as needed for pain. 02/19/22   [provider]  ezetimibe (ZETIA) 10 MG tablet Take 10 mg by mouth daily. 10/03/20   [provider]  fluticasone (FLOVENT HFA) 110 MCG/ACT inhaler Inhale 1 puff into the lungs daily as needed (asthma).    [provider]  furosemide (LASIX) 40 MG tablet Take 1 tablet (40 mg total) by mouth daily. Patient taking differently: Take 80 mg by mouth daily. 12/01/17   Rodolph Bong, MD  gabapentin (NEURONTIN) 300 MG capsule Take 1 capsule (300 mg total) by mouth 3 (three) times daily. Patient taking differently: Take 300 mg by mouth daily. 12/01/17   Rodolph Bong, MD  gabapentin (NEURONTIN) 300 MG capsule Take 300 mg by mouth daily. 09/14/22   [provider]  hydrALAZINE (APRESOLINE) 25 MG tablet Take 12.5 mg by mouth 4 (four) times daily. 10/17/20   [provider]  Multiple Vitamins-Minerals (MULTIVITAMIN WOMENS 50+ ADV) TABS Take 1 tablet by mouth daily.    [provider]  naloxone [facility]) nasal spray 4 mg/0.1 mL     [provider]  oxyCODONE (ROXICODONE) 5 MG immediate release tablet Take 1 tablet by mouth every 4-6 hours as needed for post op pain 09/24/22   [place], Jesse J, PA-C  pantoprazole (PROTONIX) 40 MG tablet Take 40 mg by mouth daily. 12/12/20   [provider]  tiZANidine (ZANAFLEX) 2 MG tablet Take 1 tablet (2 mg total) by mouth every 6 (six) hours as needed for muscle spasms. 09/03/22    Allena Katz, PA-C  vitamin C (ASCORBIC ACID) 500 MG tablet Take 500 mg by mouth daily.    [provider]  zolpidem (AMBIEN) 5 MG tablet Take 5 mg by mouth at bedtime. 01/20/12   [provider]   Allergies  Allergen Reactions   Lisinopril Swelling    angioedema   Hydrochlorothiazide Other (See Comments)    UNSPECIFIED REACTION    Lipitor [Atorvastatin Calcium] Other (See Comments)    UNSPECIFIED REACTION    Thiazide-Type Diuretics Other (See Comments)    UNSPECIFIED REACTION    Zocor [Simvastatin] Swelling    SWELLING REACTION UNSPECIFIED    Codeine Nausea Only   Review of Systems  Unable to perform ROS: Age    Physical Exam Vitals and nursing note reviewed.     Vital Signs: BP (!) 183/53   Pulse 73   Temp 98.7 F (37.1 C) (Oral)   Resp 15   Ht 5\' 5"  (1.651 m)   Wt 80.3 kg   SpO2 93%   BMI 29.45 kg/m  Pain Scale: 0-10   Pain Score: 9    SpO2: SpO2: 93 % O2 Device:SpO2: 93 % O2 Flow Rate: .   IO: Intake/output summary: No intake or output data in the 24 hours ending 02/28/23 1448  LBM:   Baseline Weight: Weight: 80.3 kg Most recent weight: Weight: 80.3 kg     Palliative Assessment/Data:     Time In: 1410 Time Out: 1450 Time Total: 40 minutes  Greater than 50%  of this time was spent counseling and coordinating care related to the above assessment and plan.  Signed by: Katheran Awe, NP   Please contact Palliative Medicine Team phone at [phone] for questions and  concerns.  For individual provider: See Loretha Stapler

## 2023-02-28 NOTE — Assessment & Plan Note (Addendum)
-  Incidental findings during CT abdomen and pelvis -Follow radiology recommendations repeat images for his stability in the next 6-12 months has been recommended. -No acute red flags reported.

## 2023-02-28 NOTE — Assessment & Plan Note (Signed)
-  continue heart healthy diet and continue tx with zetia.

## 2023-02-28 NOTE — Assessment & Plan Note (Addendum)
-  Workup not demonstrating signs of acute UTI -Patient's history and presentation suggesting prerenal azotemia, due to dehydration and continue use of nephrotoxic agents -Continue to avoid the use of NSAIDs and any other nephrotoxic agents as much as possible -Continue to hold diuretics -Continue fluid resuscitation and follow renal function trend; creatinine adequately improving and patient's condition stabilizing. -Patient with underlying history of stage IIIb/IV renal failure. -No hydronephrosis or bladder outlet obstruction appreciated on CT scan at time of admission.

## 2023-02-28 NOTE — ED Triage Notes (Signed)
Pt comes by [facility] from [facility]. Called out for weakness. Brought in with [facility] per EMS. EMS stated daughter gave pt Norco for her pain around 9am then put the medication back in her bra. Pt A&OX4 presently

## 2023-02-28 NOTE — ED Notes (Signed)
Bed bug found on pt. Pt states she is aware of them in her home.

## 2023-02-28 NOTE — ED Notes (Signed)
Pt son here and would like to get pt to come live with him.

## 2023-02-28 NOTE — Progress Notes (Signed)
Patient arrived to room 332. She is alert and oriented to person and place. Educated patient about call bell and unit. Safety video completed. Patient is able to walk to and from bathroom with 1 assist but is a high fall risk. Fall matt in place.

## 2023-02-28 NOTE — Progress Notes (Signed)
MRSA swab positive. Dr. Thomes Dinning notified.

## 2023-02-28 NOTE — Assessment & Plan Note (Addendum)
-  Involving shoulders, knees and hip -Status post knee replacement and hip surgery -Continue as needed analgesics and follow recommendations by PT services -Patient expresses to be all pending to the idea of rehabilitation at a skilled nursing facility.

## 2023-02-28 NOTE — Assessment & Plan Note (Addendum)
-  Chronic findings after reviewing CT head without contrast -Continue risk factor modifications -Continue aspirin for secondary prevention. -No focal neurologic deficits appreciated at the moment.

## 2023-02-28 NOTE — ED Provider Notes (Signed)
[place] [facility]GENC[facility] DEPARTMENT AT [facility] Provider Note   CSN: [id number] Arrival date & time: 02/28/23  4098     History  No chief complaint on file.   [name] is a 89 y.o. female.  She is brought in by EMS from home where she lives with her daughter.  Patient is reportedly not gotten off of couch for the last few days, not eating or drinking.  She does endorse pain in her left shoulder and her left hip which she says is due to her recent fall.  EMS states the patient was in very dirty gown, house was very unkempt.  Patient does not know why she is here.  Unclear baseline mental status.  Not oriented to time place or situation.  The history is provided by the patient and the EMS personnel.  Weakness Severity:  Moderate Onset quality:  Gradual Duration:  2 days Timing:  Constant Progression:  Unchanged Relieved by:  None tried Worsened by:  Nothing Ineffective treatments:  None tried Associated symptoms: difficulty walking   Associated symptoms: no abdominal pain, no chest pain, no cough, no dysuria and no fever   Fall Pertinent negatives include no chest pain and no abdominal pain.       Home Medications Prior to Admission medications   Medication Sig Start Date End Date Taking? Authorizing Provider  albuterol (PROVENTIL HFA;VENTOLIN HFA) 108 (90 BASE) MCG/ACT inhaler Inhale 2 puffs into the lungs every 6 (six) hours as needed for shortness of breath or wheezing.    [provider]  ALPRAZolam Prudy Feeler) 0.5 MG tablet Take 0.5 mg by mouth 2 (two) times daily as needed for anxiety.    [provider]  aspirin EC 81 MG tablet Take 1 tablet (81 mg total) by mouth 2 (two) times daily. 09/03/22   Allena Katz, PA-C  atenolol (TENORMIN) 50 MG tablet Take 1.5 tablets (75 mg total) by mouth daily. Patient taking differently: Take 50 mg by mouth daily. 12/01/17   Rodolph Bong, MD  B Complex-C (SUPER B COMPLEX PO) Take 1 tablet by mouth  daily.    [provider]  busPIRone (BUSPAR) 5 MG tablet Take 5 mg by mouth 2 (two) times daily. 02/20/22   [provider]  carvedilol (COREG) 25 MG tablet Take 25 mg by mouth 2 (two) times daily. 01/17/22   [provider]  cloNIDine (CATAPRES) 0.2 MG tablet Take 1 tablet (0.2 mg total) by mouth 2 (two) times daily. 12/01/17   Rodolph Bong, MD  diclofenac Sodium (VOLTAREN) 1 % GEL Apply 2 g topically 4 (four) times daily as needed for pain. 02/19/22   [provider]  ezetimibe (ZETIA) 10 MG tablet Take 10 mg by mouth daily. 10/03/20   [provider]  fluticasone (FLOVENT HFA) 110 MCG/ACT inhaler Inhale 1 puff into the lungs daily as needed (asthma).    [provider]  furosemide (LASIX) 40 MG tablet Take 1 tablet (40 mg total) by mouth daily. Patient taking differently: Take 80 mg by mouth daily. 12/01/17   Rodolph Bong, MD  gabapentin (NEURONTIN) 300 MG capsule Take 1 capsule (300 mg total) by mouth 3 (three) times daily. Patient taking differently: Take 300 mg by mouth daily. 12/01/17   Rodolph Bong, MD  gabapentin (NEURONTIN) 300 MG capsule Take 300 mg by mouth daily. 09/14/22   [provider]  hydrALAZINE (APRESOLINE) 25 MG tablet Take 12.5 mg by mouth 4 (four) times  daily. 10/17/20   [provider]  Multiple Vitamins-Minerals (MULTIVITAMIN WOMENS 50+ ADV) TABS Take 1 tablet by mouth daily.    [provider]  naloxone [facility]) nasal spray 4 mg/0.1 mL     [provider]  oxyCODONE (ROXICODONE) 5 MG immediate release tablet Take 1 tablet by mouth every 4-6 hours as needed for post op pain 09/24/22   [place], Jesse J, PA-C  pantoprazole (PROTONIX) 40 MG tablet Take 40 mg by mouth daily. 12/12/20   [provider]  tiZANidine (ZANAFLEX) 2 MG tablet Take 1 tablet (2 mg total) by mouth every 6 (six) hours as needed for muscle spasms. 09/03/22   Allena Katz, PA-C  vitamin C (ASCORBIC  ACID) 500 MG tablet Take 500 mg by mouth daily.    [provider]  zolpidem (AMBIEN) 5 MG tablet Take 5 mg by mouth at bedtime. 01/20/12   [provider]      Allergies    Lisinopril, Hydrochlorothiazide, Lipitor [atorvastatin calcium], Thiazide-type diuretics, Zocor [simvastatin], and Codeine    Review of Systems   Review of Systems  Constitutional:  Negative for fever.  Respiratory:  Negative for cough.   Cardiovascular:  Negative for chest pain.  Gastrointestinal:  Negative for abdominal pain.  Genitourinary:  Negative for dysuria.  Musculoskeletal:  Positive for back pain and gait problem.  Neurological:  Positive for weakness.    Physical Exam Updated Vital Signs BP (!) 161/104   Pulse 94   Temp 98.6 F (37 C)   Resp 13   Ht 4\' 10"  (1.473 m)   Wt 80.3 kg   SpO2 99%   BMI 36.99 kg/m  Physical Exam Vitals and nursing note reviewed.  Constitutional:      General: She is not in acute distress.    Appearance: Normal appearance. She is well-developed.  HENT:     Head: Normocephalic and atraumatic.  Eyes:     Conjunctiva/sclera: Conjunctivae normal.  Cardiovascular:     Rate and Rhythm: Normal rate and regular rhythm.     Heart sounds: No murmur heard. Pulmonary:     Effort: Pulmonary effort is normal. No respiratory distress.     Breath sounds: Normal breath sounds.  Abdominal:     Palpations: Abdomen is soft.     Tenderness: There is no abdominal tenderness. There is no guarding or rebound.  Musculoskeletal:        General: Tenderness present. No deformity.     Cervical back: Neck supple.     Comments: She is some tenderness around her left shoulder.  Full range of motion.  Elbow wrist nontender.  Right upper extremity full range of motion without any pain or limitations.  Lower extremities she has some tenderness around her left hip and there is a bruise on the lateral aspect.  Bilateral knee replacements with well-healed scars.  Distal pulses  motor and sensation intact.  Skin:    General: Skin is warm and dry.     Capillary Refill: Capillary refill takes less than 2 seconds.  Neurological:     General: No focal deficit present.     Mental Status: She is alert. She is disoriented.     Sensory: No sensory deficit.     Motor: No weakness.     ED Results / Procedures / Treatments   Labs (all labs ordered are listed, but only abnormal results are displayed) Labs Reviewed  COMPREHENSIVE METABOLIC PANEL - Abnormal; Notable for the following components:  Result Value   Potassium 5.2 (*)    CO2 20 (*)    Glucose, Bld 140 (*)    BUN 92 (*)    Creatinine, Ser 3.62 (*)    Calcium 8.6 (*)    Albumin 3.3 (*)    AST 240 (*)    ALT 372 (*)    GFR, Estimated 12 (*)    All other components within normal limits  CBC WITH DIFFERENTIAL/PLATELET - Abnormal; Notable for the following components:   WBC 11.2 (*)    RBC 3.68 (*)    Hemoglobin 10.9 (*)    HCT 34.8 (*)    Neutro Abs 9.6 (*)    Abs Immature Granulocytes 0.08 (*)    All other components within normal limits  URINALYSIS, W/ REFLEX TO CULTURE (INFECTION SUSPECTED) - Abnormal; Notable for the following components:   Protein, ur 30 (*)    All other components within normal limits  CBG MONITORING, ED - Abnormal; Notable for the following components:   Glucose-Capillary 137 (*)    All other components within normal limits  TROPONIN I (HIGH SENSITIVITY) - Abnormal; Notable for the following components:   Troponin I (High Sensitivity) 25 (*)    All other components within normal limits  TROPONIN I (HIGH SENSITIVITY) - Abnormal; Notable for the following components:   Troponin I (High Sensitivity) 21 (*)    All other components within normal limits  SARS CORONAVIRUS 2 BY RT PCR  MRSA NEXT GEN BY PCR, NASAL  LACTIC ACID, PLASMA  LIPASE, BLOOD    EKG EKG Interpretation  Date/Time:  Thursday February 28 2023 10:04:10 EDT Ventricular Rate:  83 PR Interval:  161 QRS  Duration: 160 QT Interval:  401 QTC Calculation: 472 R Axis:   88 Text Interpretation: Sinus rhythm Right bundle branch block No significant change since prior 7/23 Confirmed by Meridee Score [phone]) on 02/28/2023 10:17:36 AM  Radiology DG Knee Complete 4 Views Left  Result Date: 02/28/2023 CLINICAL DATA:  Weakness. EXAM: LEFT KNEE - COMPLETE 4+ VIEW COMPARISON:  Radiograph 09/21/2022 FINDINGS: Fracture through the inferior patellar pole is chronic, however the fracture fragments demonstrate increased displacement from prior exam. There is no 2 cm of osseous distraction. Patellar [place]. Tibiofemoral arthroplasty is intact without periprosthetic fracture. There is a knee joint effusion. Anterior soft tissue edema. IMPRESSION: 1. Fracture through the inferior patellar pole is chronic, however the fracture fragments demonstrate increased displacement from December exam. Patellar [place]. 2. Intact tibiofemoral arthroplasty. 3. Knee joint effusion and anterior soft tissue edema. Electronically Signed   By: Narda Rutherford M.D.   On: 02/28/2023 14:19   DG Hip Unilat With Pelvis 2-3 Views Left  Result Date: 02/28/2023 CLINICAL DATA:  Weakness. EXAM: DG HIP (WITH OR WITHOUT PELVIS) 2-3V LEFT COMPARISON:  None Available. FINDINGS: Left hip arthroplasty. Femoral stem is slightly laterally oriented. No periprosthetic fracture or lucency. Intact pubic rami and bony pelvis. Right hip arthroplasty with heterotopic calcification adjacent to the greater trochanter. Advanced degenerative change of the pubic symphysis and sacroiliac joints. IMPRESSION: Left hip arthroplasty with slight lateral orientation of the femoral stem. No periprosthetic fracture or acute findings. Electronically Signed   By: Narda Rutherford M.D.   On: 02/28/2023 14:17   DG Chest Port 1 View  Result Date: 02/28/2023 CLINICAL DATA:  Weakness. EXAM: PORTABLE CHEST 1 VIEW COMPARISON:  Chest radiograph 03/29/2022 FINDINGS: Heart size upper normal.  Normal mediastinal contours. No focal airspace disease, pulmonary edema, large pleural effusion or pneumothorax. IMPRESSION:  Borderline cardiomegaly without acute pulmonary process. Electronically Signed   By: Narda Rutherford M.D.   On: 02/28/2023 14:15   CT ABDOMEN PELVIS WO CONTRAST  Result Date: 02/28/2023 CLINICAL DATA:  Acute generalized abdominal pain. Elevated liver function tests. EXAM: CT ABDOMEN AND PELVIS WITHOUT CONTRAST TECHNIQUE: Multidetector CT imaging of the abdomen and pelvis was performed following the standard protocol without IV contrast. RADIATION DOSE REDUCTION: This exam was performed according to the departmental dose-optimization program which includes automated exposure control, adjustment of the mA and/or kV according to patient size and/or use of iterative reconstruction technique. COMPARISON:  None Available. FINDINGS: Lower chest: No acute abnormality. Hepatobiliary: Status post cholecystectomy. Mild intrahepatic and extrahepatic biliary dilatation is noted most likely due to post cholecystectomy status. Liver is otherwise unremarkable on these unenhanced images. Pancreas: Fatty replacement of the pancreas is noted. Spleen: Normal in size without focal abnormality. Adrenals/Urinary Tract: Adrenal glands appear normal. Exophytic cysts are seen involving the left kidney for which no further follow-up is required. No hydronephrosis or renal obstruction is noted. Urinary bladder is not well visualized due to scatter artifact arising from bilateral hip prostheses. Stomach/Bowel: The stomach is unremarkable. There is no evidence of bowel obstruction or inflammation. Vascular/Lymphatic: Aortic atherosclerosis. No enlarged abdominal or pelvic lymph nodes. Reproductive: Uterus and left adnexa are unremarkable. 6.1 cm right adnexal cyst is noted. Other: No abdominal wall hernia or abnormality. No abdominopelvic ascites. Musculoskeletal: Status post bilateral total hip arthroplasties. No  acute osseous abnormality is noted. IMPRESSION: 6.1 cm right adnexal cyst. Recommend follow-up US in 6-12 months. Note: This recommendation does not apply to premenarchal patients and to those with increased risk (genetic, family history, elevated tumor markers or other high-risk factors) of ovarian cancer. Reference: JACR 2020 Feb; 17(2):248-254. No acute abnormality seen in the abdomen or pelvis. Aortic Atherosclerosis (ICD10-I70.0). Electronically Signed   By: Lupita Raider M.D.   On: 02/28/2023 14:15   DG Shoulder Left  Result Date: 02/28/2023 CLINICAL DATA:  Weakness. EXAM: LEFT SHOULDER - 2+ VIEW COMPARISON:  None Available. FINDINGS: Glenohumeral degenerative change with prominent subchondral cysts in the humeral head. Moderate acromioclavicular degenerative change with inferior osteophytes. There is a subacromial spur. Subcortical cystic change in the lateral humeral head. No fracture or acute osseous abnormalities. No soft tissue calcifications. IMPRESSION: 1. Glenohumeral and acromioclavicular degenerative change. Subacromial spur. 2. Subcortical cystic change in the lateral humeral head, suggesting underlying rotator cuff pathology. Electronically Signed   By: Narda Rutherford M.D.   On: 02/28/2023 14:14   CT Head Wo Contrast  Result Date: 02/28/2023 CLINICAL DATA:  Mental status change, unknown cause EXAM: CT HEAD WITHOUT CONTRAST TECHNIQUE: Contiguous axial images were obtained from the base of the skull through the vertex without intravenous contrast. RADIATION DOSE REDUCTION: This exam was performed according to the departmental dose-optimization program which includes automated exposure control, adjustment of the mA and/or kV according to patient size and/or use of iterative reconstruction technique. COMPARISON:  CT Head 09/26/17 FINDINGS: Brain: No hemorrhage. No extra-axial fluid collection. No hydrocephalus. 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 This is likely chronic. No extra-axial fluid collection. Vascular: No hyperdense vessel or unexpected calcification. Skull: Normal. Negative for fracture or focal lesion. Sinuses/Orbits: No middle ear or mastoid effusion. Paranasal sinuses are clear. Bilateral lens replacement. Orbits are otherwise unremarkable. Other: None. 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 This  is chronic appearing, but technically age indeterminate. If there is clinical concern, further evaluation with a MRI could be considered. Electronically Signed   By: Lorenza Cambridge M.D.   On: 02/28/2023 13:56    Procedures Procedures    Medications Ordered in ED Medications  0.9 %  sodium chloride infusion (has no administration in time range)  heparin injection 5,000 Units (has no administration in time range)  feeding supplement (ENSURE ENLIVE / ENSURE PLUS) liquid 237 mL (237 mLs Oral Given 02/28/23 1608)  sodium chloride 0.9 % bolus 1,000 mL (1,000 mLs Intravenous New Bag/Given 02/28/23 1201)    ED Course/ Medical Decision Making/ A&P Clinical Course as of 02/28/23 1702  Thu Feb 28, 2023  1127 Labs coming back with new AKI, elevated potassium 5.2, elevated AST and ALT.  Have put in for head CT, CT abdomen and pelvis.  IV fluids.  Urinalysis without definite signs of infection, lactic acid normal [MB]  1438 Patient's son is here and now.  He said that the patient lives with her daughter and the place is not fit for habitation.  He says there is cockroaches everywhere and he brings them food.  He is hoping that patient can be placed in a nursing home.  He understands that she will be admitted to the hospital for hydration and further evaluation.  I discussed case with Dr. Gwenlyn Perking [facility] hospitalist who will evaluate patient for admission. [MB]    Clinical Course User Index [MB] Terrilee Files, MD                             Medical Decision  Making Amount and/or Complexity of Data Reviewed Labs: ordered. Radiology: ordered.  Risk Decision regarding hospitalization.   This patient complains of general weakness, fall, pain left side of body; this involves an extensive number of treatment Options and is a complaint that carries with it a high risk of complications and morbidity. The differential includes dehydration, malnutrition, AKI, metabolic derangement, fracture, dislocation, infection  I ordered, reviewed and interpreted labs, which included CBC with mildly elevated white count low stable hemoglobin, chemistries with new AKI and new elevated potassium elevated LFTs, urinalysis without signs of infection, COVID-negative, troponins mildly elevated but flat I ordered medication IV fluids and reviewed PMP when indicated. I ordered imaging studies which included x-rays of chest left hip left knee left shoulder, CT head, CT abdomen and pelvis and I independently    visualized and interpreted imaging which showed CVA unclear of age, adrenal cyst, degenerative changes, possible worsening of old patellar fracture Additional history obtained from EMS and patient's son Previous records obtained and reviewed in epic including prior discharge summary and PCP notes I consulted [facility] hospitalist Dr. Gwenlyn Perking and discussed lab and imaging findings and discussed disposition.  Cardiac monitoring reviewed, normal sinus rhythm Social determinants considered, no significant barriers Critical Interventions: None  After the interventions stated above, I reevaluated the patient and found patient to be fairly incapable of caring for herself Admission and further testing considered, she would benefit from mission to the hospital for improvement in her dehydration and AKI.  She will need evaluation by physical therapy and TOC as likely cannot return home         Final Clinical Impression(s) / ED Diagnoses Final diagnoses:  Generalized weakness   AKI (acute kidney injury) (HCC)  Adnexal cyst    Rx / DC Orders ED Discharge Orders  None         Terrilee Files, MD 02/28/23 [phone]

## 2023-02-28 NOTE — Assessment & Plan Note (Addendum)
-  Resume home antihypertensive agents with exception of nephrotoxic medications and diuretics -Continue to maintain adequate hydration and continue heart healthy diet.

## 2023-02-28 NOTE — H&P (Signed)
History and Physical    Patient: [name] NFA:[id number] DOB: 05/19/1932 DOA: 02/28/2023 DOS: the patient was seen and examined on 02/28/2023 PCP: Corrington, Kip A, MD  Patient coming from: Home  Chief Complaint:  Chief Complaint  Patient presents with   Weakness   Fall    Pt states she fell last week and c/o left shoulder pain   HPI: [name] is a 89 y.o. female with medical history significant of history of asthma, anxiety, chronic renal failure stage IIIb/IV, hypertension, mixed hyperlipidemia and osteoarthritis; presented to the hospital secondary to generalized weakness and failure to thrive.  Symptoms have been present for the last 4 days or so; when patient was found by EMS living condition was very deplorable and the house was unkept.  Patient oriented x 1-2 intermittently only and not able to contribute much to history. -Per records and reports provided at time of admission decreased oral intake will continue home medications leading to severe dehydration and failure to thrive.  Patient has been complaining of shoulders knees and hip pain.  No chest pain, no nausea, no vomiting, no shortness of breath patient denies dysuria or any particular focal deficits.  Workup in the ED demonstrating negative CT head for acute intracranial abnormality; positive chronic frontal lobe stroke.  Urinalysis not demonstrating acute infection; chest x-ray without acute cardiopulmonary process.  Basic metabolic panel with positive increase BUN and creatinine and also mild hyperkalemia.  Patient will be admitted due to acute on chronic renal failure along with dehydration and failure to thrive.   Review of Systems: As mentioned in the history of present illness. All other systems reviewed and are negative. Past Medical History:  Diagnosis Date   Anxiety    Arthritis    Asthma    Cancer (HCC)    SKIN CA OF FACE   Chronic kidney disease    Chronic pain    COVID    moderate case - had  pneumonia   Depression    Diabetes mellitus without complication (HCC)    Gallstones    GERD (gastroesophageal reflux disease)    Hyperlipidemia    Hypertension    Insomnia    Iron deficiency anemia    Peri-prosthetic patellar fracture 09/19/2022   Pneumonia    Prosthetic joint infection (HCC)    RBBB 03/29/2022   TIA (transient ischemic attack)    Past Surgical History:  Procedure Laterality Date    ARM FRACTURE     LEFT  and right   CHOLECYSTECTOMY     DILATION AND CURETTAGE OF UTERUS     IRRIGATION AND DEBRIDEMENT KNEE  09/12/2011   Procedure: IRRIGATION AND DEBRIDEMENT KNEE;  Surgeon: Nestor Lewandowsky;  Location: [facility] OR;  Service: Orthopedics;  Laterality: Right;  I&D RIGHT TKA REVISE BEARINGS/MBT. Poly exchange   PATELLAR TENDON REPAIR Left 09/21/2022   Procedure: LEFT PATELLAR TENDON REPAIR;  Surgeon: Gean Birchwood, MD;  Location: [facility] OR;  Service: Orthopedics;  Laterality: Left;   REPLACEMENT TOTAL KNEE     right   TOTAL HIP ARTHROPLASTY Left    TOTAL HIP ARTHROPLASTY Right 01/20/2018   Procedure: TOTAL HIP ARTHROPLASTY WITH REMOVAL OF INTRAMEDULLARY NAIL;  Surgeon: Gean Birchwood, MD;  Location: [facility] OR;  Service: Orthopedics;  Laterality: Right;   TOTAL KNEE ARTHROPLASTY Left 09/03/2022   Procedure: LEFT TOTAL KNEE ARTHROPLASTY;  Surgeon: Gean Birchwood, MD;  Location: [facility] ORS;  Service: Orthopedics;  Laterality: Left;   TOTAL SHOULDER ARTHROPLASTY Right 10/02/2018   Procedure:  REVERSE TOTAL SHOULDER;  Surgeon: Jones Broom, MD;  Location: [facility] OR;  Service: Orthopedics;  Laterality: Right;   Social History:  reports that she quit smoking about 44 years ago. Her smoking use included cigarettes. She has a 2.50 pack-year smoking history. She has never used smokeless tobacco. She reports that she does not drink alcohol and does not use drugs.  Allergies  Allergen Reactions   Lisinopril Swelling    angioedema   Hydrochlorothiazide Other (See Comments)    UNSPECIFIED REACTION     Lipitor [Atorvastatin Calcium] Other (See Comments)    UNSPECIFIED REACTION    Thiazide-Type Diuretics Other (See Comments)    UNSPECIFIED REACTION    Zocor [Simvastatin] Swelling    SWELLING REACTION UNSPECIFIED    Codeine Nausea Only    Family History  Problem Relation Age of Onset   Diabetes Mellitus II Daughter     Prior to Admission medications   Medication Sig Start Date End Date Taking? Authorizing Provider  albuterol (PROVENTIL HFA;VENTOLIN HFA) 108 (90 BASE) MCG/ACT inhaler Inhale 2 puffs into the lungs every 6 (six) hours as needed for shortness of breath or wheezing.    [provider]  ALPRAZolam Prudy Feeler) 0.5 MG tablet Take 0.5 mg by mouth 2 (two) times daily as needed for anxiety.    [provider]  aspirin EC 81 MG tablet Take 1 tablet (81 mg total) by mouth 2 (two) times daily. 09/03/22   Allena Katz, PA-C  atenolol (TENORMIN) 50 MG tablet Take 1.5 tablets (75 mg total) by mouth daily. Patient taking differently: Take 50 mg by mouth daily. 12/01/17   Rodolph Bong, MD  B Complex-C (SUPER B COMPLEX PO) Take 1 tablet by mouth daily.    [provider]  busPIRone (BUSPAR) 5 MG tablet Take 5 mg by mouth 2 (two) times daily. 02/20/22   [provider]  carvedilol (COREG) 25 MG tablet Take 25 mg by mouth 2 (two) times daily. 01/17/22   [provider]  cloNIDine (CATAPRES) 0.2 MG tablet Take 1 tablet (0.2 mg total) by mouth 2 (two) times daily. 12/01/17   Rodolph Bong, MD  diclofenac Sodium (VOLTAREN) 1 % GEL Apply 2 g topically 4 (four) times daily as needed for pain. 02/19/22   [provider]  ezetimibe (ZETIA) 10 MG tablet Take 10 mg by mouth daily. 10/03/20   [provider]  fluticasone (FLOVENT HFA) 110 MCG/ACT inhaler Inhale 1 puff into the lungs daily as needed (asthma).    [provider]  furosemide (LASIX) 40 MG tablet Take 1 tablet (40 mg total) by mouth daily. Patient taking  differently: Take 80 mg by mouth daily. 12/01/17   Rodolph Bong, MD  gabapentin (NEURONTIN) 300 MG capsule Take 1 capsule (300 mg total) by mouth 3 (three) times daily. Patient taking differently: Take 300 mg by mouth daily. 12/01/17   Rodolph Bong, MD  gabapentin (NEURONTIN) 300 MG capsule Take 300 mg by mouth daily. 09/14/22   [provider]  hydrALAZINE (APRESOLINE) 25 MG tablet Take 12.5 mg by mouth 4 (four) times daily. 10/17/20   [provider]  Multiple Vitamins-Minerals (MULTIVITAMIN WOMENS 50+ ADV) TABS Take 1 tablet by mouth daily.    [provider]  naloxone [facility]) nasal spray 4 mg/0.1 mL     [provider]  oxyCODONE (ROXICODONE) 5 MG immediate release tablet Take 1 tablet by mouth every 4-6 hours as needed for post op pain 09/24/22  [place], Jesse J, PA-C  pantoprazole (PROTONIX) 40 MG tablet Take 40 mg by mouth daily. 12/12/20   [provider]  tiZANidine (ZANAFLEX) 2 MG tablet Take 1 tablet (2 mg total) by mouth every 6 (six) hours as needed for muscle spasms. 09/03/22   Allena Katz, PA-C  vitamin C (ASCORBIC ACID) 500 MG tablet Take 500 mg by mouth daily.    [provider]  zolpidem (AMBIEN) 5 MG tablet Take 5 mg by mouth at bedtime. 01/20/12   [provider]    Physical Exam: Vitals:   02/28/23 1400 02/28/23 1428 02/28/23 1500 02/28/23 1520  BP: (!) 183/53  (!) 159/50 (!) 161/104  Pulse: 73  71 94  Resp: 15  13   Temp:  98.7 F (37.1 C)  98.6 F (37 C)  TempSrc:  Oral    SpO2: 93%  99% 99%  Weight:      Height:    4\' 10"  (1.473 m)   General exam: Alert, awake, oriented x 2 intermittently; distress.  Chronically ill in appearance.   Respiratory system: Good air movement bilaterally; no wheezing.  Good saturation on room air appreciated. Cardiovascular system:RRR. No rubs or gallops; no JVD Gastrointestinal system: Abdomen is nondistended, soft and nontender. No organomegaly or masses felt.  Normal bowel sounds heard. Central nervous system: Generally weak, no focal deficit.  Moving 4 limbs spontaneously. Extremities: No cyanosis or clubbing. Skin: No petechiae. Psychiatry: Judgement and insight appear impaired secondary to acute encephalopathy.  Data Reviewed: Comprehensive metabolic panel: Sodium 136, potassium 5.2, chloride 1 BUN 92, creatinine 3.62 and calcium 8.67 AST 240, ALT 372 and GFR 12. Troponin: 25 >> 21 Lipase: 24 -Lactic acid: 1.0 SARS coronavirus 2 by PCR negative. Urinalysis: feT1.015:385.0,95", hemoglobin urine dipstick negative, negative ketones, negative nitrites and negative leukocyte esterase.   Assessment and Plan: * Acute on chronic renal failure (HCC) -Workup not demonstrating signs of acute UTI -Patient's history and presentation suggesting prerenal azotemia, dehydration and continue use of nephrotoxic agents -Avoid the use of NSAIDs and any other nephrotoxic agents as much as possible -Hold diuretics -Provide fluid resuscitation and maintain adequate hydration -Avoid hypotension and the use of contrast. -Patient with underlying history of stage IIIb/IV renal failure. -CT scan demonstrating no hydronephrosis or signs of bladder obstruction. -Follow clinical response.  Mixed hyperlipidemia -continue heart healthy diet and continue tx with zetia.  CVA (cerebral vascular accident) (HCC) -Chronic findings after reviewing CT head without contrast -Continue risk factor modifications -Continue aspirin for secondary prevention.  Adrenal gland cyst (HCC) -Incidental findings during CT abdomen and pelvis -Follow radiology recommendations repeat images for his stability in the next 6-12 months has been recommended.  OA (osteoarthritis) -Involving shoulders, knees and hip -Status post knee replacement and hip surgery -Continue as needed analgesics -Physical therapy evaluation and recommendations for safe discharge.  Anxiety -Continue the use of  BuSpar and as needed Xanax.  HTN (hypertension) -Resume home antihypertensive agents with exception of nephrotoxic medications and diuretics -Heart healthy diet has been ordered -Continue to maintain adequate hydration.  Acute metabolic encephalopathy -Appears to be associated with uremia in the setting of acute on chronic renal failure -Provide fluid resuscitation -Minimize sedative agents -Continue constant reorientation.  Mild hyperkalemia -no acute telemetry changes appreciated -will follow trend -continue fluid resuscitation.  Transaminitis -most likely associated with dehydration and mild non traumatic rhabdomyolysis  -Continue fluid resuscitation -Checking CK levels -Follow LFTs trend.    Advance Care Planning:   Code Status: Full Code  Consults: Palliative care  Family Communication: No family at bedside.  Severity of Illness: The appropriate patient status for this patient is INPATIENT. Inpatient status is judged to be reasonable and necessary in order to provide the required intensity of service to ensure the patient's safety. The patient's presenting symptoms, physical exam findings, and initial radiographic and laboratory data in the context of their chronic comorbidities is felt to place them at high risk for further clinical deterioration. Furthermore, it is not anticipated that the patient will be medically stable for discharge from the hospital within 2 midnights of admission.   * I certify that at the point of admission it is my clinical judgment that the patient will require inpatient hospital care spanning beyond 2 midnights from the point of admission due to high intensity of service, high risk for further deterioration and high frequency of surveillance required.*  Author: Vassie Loll, MD 02/28/2023 6:10 PM  For on call review [email or web address].

## 2023-03-01 DIAGNOSIS — N949 Unspecified condition associated with female genital organs and menstrual cycle: Secondary | ICD-10-CM | POA: Diagnosis not present

## 2023-03-01 DIAGNOSIS — N179 Acute kidney failure, unspecified: Secondary | ICD-10-CM | POA: Diagnosis not present

## 2023-03-01 DIAGNOSIS — E875 Hyperkalemia: Secondary | ICD-10-CM

## 2023-03-01 DIAGNOSIS — N184 Chronic kidney disease, stage 4 (severe): Secondary | ICD-10-CM

## 2023-03-01 DIAGNOSIS — E782 Mixed hyperlipidemia: Secondary | ICD-10-CM | POA: Diagnosis not present

## 2023-03-01 DIAGNOSIS — M158 Other polyosteoarthritis: Secondary | ICD-10-CM | POA: Diagnosis not present

## 2023-03-01 LAB — COMPREHENSIVE METABOLIC PANEL
ALT: 322 U/L — ABNORMAL HIGH (ref 0–44)
AST: 197 U/L — ABNORMAL HIGH (ref 15–41)
Albumin: 2.6 g/dL — ABNORMAL LOW (ref 3.5–5.0)
Alkaline Phosphatase: 54 U/L (ref 38–126)
Anion gap: 9 (ref 5–15)
BUN: 69 mg/dL — ABNORMAL HIGH (ref 8–23)
CO2: 20 mmol/L — ABNORMAL LOW (ref 22–32)
Calcium: 8.1 mg/dL — ABNORMAL LOW (ref 8.9–10.3)
Chloride: 110 mmol/L (ref 98–111)
Creatinine, Ser: 1.83 mg/dL — ABNORMAL HIGH (ref 0.44–1.00)
GFR, Estimated: 26 mL/min — ABNORMAL LOW (ref >60)
Glucose, Bld: 107 mg/dL — ABNORMAL HIGH (ref 70–99)
Potassium: 4.4 mmol/L (ref 3.5–5.1)
Sodium: 139 mmol/L (ref 135–145)
Total Bilirubin: 0.6 mg/dL (ref 0.3–1.2)
Total Protein: 5.7 g/dL — ABNORMAL LOW (ref 6.5–8.1)

## 2023-03-01 LAB — CBC
HCT: 30.1 % — ABNORMAL LOW (ref 36.0–46.0)
Hemoglobin: 9.2 g/dL — ABNORMAL LOW (ref 12.0–15.0)
MCH: 29.2 pg (ref 26.0–34.0)
MCHC: 30.6 g/dL (ref 30.0–36.0)
MCV: 95.6 fL (ref 80.0–100.0)
Platelets: 211 10*3/uL (ref 150–400)
RBC: 3.15 MIL/uL — ABNORMAL LOW (ref 3.87–5.11)
RDW: 14.7 % (ref 11.5–15.5)
WBC: 7 10*3/uL (ref 4.0–10.5)
nRBC: 0 % (ref 0.0–0.2)

## 2023-03-01 NOTE — Evaluation (Signed)
Physical Therapy Evaluation Patient Details Name: [name] MRN: [id number] DOB: 03-13-1932 Today's Date: 03/01/2023  History of Present Illness  [name] is a 89 y.o. female with medical history significant of history of asthma, anxiety, chronic renal failure stage IIIb/IV, hypertension, mixed hyperlipidemia and osteoarthritis; presented to the hospital secondary to generalized weakness and failure to thrive.  Symptoms have been present for the last 4 days or so; when patient was found by EMS living condition was very deplorable and the house was unkept.  Patient oriented x 1-2 intermittently only and not able to contribute much to history.  -Per records and reports provided at time of admission decreased oral intake will continue home medications leading to severe dehydration and failure to thrive.  Patient has been complaining of shoulders knees and hip pain.     No chest pain, no nausea, no vomiting, no shortness of breath patient denies dysuria or any particular focal deficits.     Workup in the ED demonstrating negative CT head for acute intracranial abnormality; positive chronic frontal lobe stroke.  Urinalysis not demonstrating acute infection; chest x-ray without acute cardiopulmonary process.  Basic metabolic panel with positive increase BUN and creatinine and also mild hyperkalemia.  Patient will be admitted due to acute on chronic renal failure along with dehydration and failure to thrive.    Clinical Impression  Patient limited for functional mobility as stated below secondary to BLE weakness, fatigue and impaired standing balance. Patient impulsive with mobility requiring frequent cueing to slow down. She requires min assist with mobility today for mild strength deficit as well as balance. Patient ambulates in room/hall without loss of balance but mild unsteadiness. Patient will benefit from continued physical therapy in hospital and recommended venue below to increase strength,  balance, endurance for safe ADLs and gait.        Recommendations for follow up therapy are one component of a multi-disciplinary discharge planning process, led by the attending physician.  Recommendations may be updated based on patient status, additional functional criteria and insurance authorization.  Follow Up Recommendations Can patient physically be transported by private vehicle: Yes     Assistance Recommended at Discharge Intermittent Supervision/Assistance  Patient can return home with the following  A little help with walking and/or transfers;A little help with bathing/dressing/bathroom;Assistance with cooking/housework;Assist for transportation;Help with stairs or ramp for entrance    Equipment Recommendations None recommended by PT  Recommendations for Other Services       Functional Status Assessment Patient has had a recent decline in their functional status and demonstrates the ability to make significant improvements in function in a reasonable and predictable amount of time.     Precautions / Restrictions Precautions Precautions: Fall Restrictions Weight Bearing Restrictions: No      Mobility  Bed Mobility Overal bed mobility: Needs Assistance Bed Mobility: Supine to Sit, Sit to Supine     Supine to sit: Min assist Sit to supine: Supervision        Transfers Overall transfer level: Needs assistance Equipment used: Rolling walker (2 wheels) Transfers: Sit to/from Stand Sit to Stand: Min assist           General transfer comment: assist for balance and slight weakness    Ambulation/Gait Ambulation/Gait assistance: Min assist Gait Distance (Feet): 60 Feet Assistive device: Rolling walker (2 wheels) Gait Pattern/deviations: Step-through pattern, Decreased stride length       General Gait Details: mild unsteadiness but no loss of balance  Stairs  Wheelchair Mobility    Modified Rankin (Stroke Patients Only)        Balance Overall balance assessment: Needs assistance Sitting-balance support: Feet supported, No upper extremity supported Sitting balance-Leahy Scale: Fair Sitting balance - Comments: seated EOB   Standing balance support: Bilateral upper extremity supported, Reliant on assistive device for balance Standing balance-Leahy Scale: Fair Standing balance comment: with RW                             Pertinent Vitals/Pain Pain Assessment Pain Assessment: No/denies pain    Home Living Family/patient expects to be discharged to:: Private residence Living Arrangements: Alone;Children Available Help at Discharge: Family Type of Home: Mobile home Home Access: Stairs to enter Entrance Stairs-Rails: Lawyer of Steps: 4   Home Layout: One level Home Equipment: Agricultural consultant (2 wheels);Cane - single point      Prior Function Prior Level of Function : Needs assist             Mobility Comments: patient states household ambulation with RW ADLs Comments: independent with basic adl     Hand Dominance        Extremity/Trunk Assessment   Upper Extremity Assessment Upper Extremity Assessment: Overall WFL for tasks assessed    Lower Extremity Assessment Lower Extremity Assessment: Generalized weakness;Overall WFL for tasks assessed       Communication   Communication: No difficulties  Cognition Arousal/Alertness: Awake/alert Behavior During Therapy: Impulsive, WFL for tasks assessed/performed Overall Cognitive Status: No family/caregiver present to determine baseline cognitive functioning                                          General Comments      Exercises     Assessment/Plan    PT Assessment Patient needs continued PT services  PT Problem List Decreased strength;Decreased activity tolerance;Decreased balance;Decreased mobility;Decreased safety awareness       PT Treatment Interventions DME  instruction;Balance training;Gait training;Neuromuscular re-education;Stair training;Functional mobility training;Patient/family education;Therapeutic activities;Therapeutic exercise;Manual techniques    PT Goals (Current goals can be found in the Care Plan section)  Acute Rehab PT Goals Patient Stated Goal: return home PT Goal Formulation: With patient Time For Goal Achievement: 03/08/23 Potential to Achieve Goals: Good    Frequency Min 3X/week     Co-evaluation               AM-PAC PT "6 Clicks" Mobility  Outcome Measure Help needed turning from your back to your side while in a flat bed without using bedrails?: None Help needed moving from lying on your back to sitting on the side of a flat bed without using bedrails?: A Little Help needed moving to and from a bed to a chair (including a wheelchair)?: A Little Help needed standing up from a chair using your arms (e.g., wheelchair or bedside chair)?: A Little Help needed to walk in hospital room?: A Little Help needed climbing 3-5 steps with a railing? : A Lot 6 Click Score: 18    End of Session Equipment Utilized During Treatment: Gait belt Activity Tolerance: Patient tolerated treatment well Patient left: in bed;with call bell/phone within reach;with bed alarm set Nurse Communication: Mobility status PT Visit Diagnosis: Unsteadiness on feet (R26.81);Other abnormalities of gait and mobility (R26.89);Muscle weakness (generalized) (M62.81)    Time: 1610-9604 PT Time Calculation (min) (ACUTE  ONLY): 15 min   Charges:   PT Evaluation $PT Eval Low Complexity: 1 Low PT Treatments $Therapeutic Activity: 8-22 mins        3:13 PM, 03/01/23 Wyman Songster PT, DPT Physical Therapist at [facility]

## 2023-03-01 NOTE — [place] FL2 (Signed)
[place] MEDICAID FL2 LEVEL OF CARE FORM     IDENTIFICATION  Patient Name: ALENCIA [name] Birthdate: Sep 16, 1932 Sex: female Admission Date (Current Location): 02/28/2023  [facility] and [place] Number:  [employer] and Address:  [facility],  [address], [place] [address]      Provider Number: [id number]  Attending Physician Name and Address:  Vassie Loll, MD  Relative Name and Phone Number:  Loura Back [phone]) [phone]    Current Level of Care: Hospital Recommended Level of Care: Skilled Nursing Facility Prior Approval Number:    Date Approved/Denied:   PASRR Number: [id number]  Discharge Plan: SNF    Current Diagnoses: Patient Active Problem List   Diagnosis Date Noted   Acute on chronic renal failure (HCC) 02/28/2023   OA (osteoarthritis) 02/28/2023   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   CVA (cerebral vascular accident) (HCC) 02/28/2023   Mixed hyperlipidemia 02/28/2023   Periprosthetic fracture of patella 09/21/2022   Peri-prosthetic patellar fracture 09/19/2022   S/P total knee arthroplasty, left 09/03/2022   Degenerative arthritis of left knee 01/18/2021   S/P reverse total shoulder arthroplasty, 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   Primary osteoarthritis of right hip 01/20/2018   Osteoarthritis of right knee 01/18/2018   Chronic pain syndrome    Anxiety    Right hip pain 11/29/2017   CKD (chronic kidney disease) stage 2, GFR 60-89 ml/min 11/29/2017   Prosthetic joint infection (HCC)    Renal failure 05/27/2012   HTN (hypertension) 10/17/2011   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    Orientation RESPIRATION BLADDER Height & Weight     Self, Time, Situation, Place  Normal Continent Weight: 80.3 kg Height:  4\' 10"  (147.3 cm)  BEHAVIORAL SYMPTOMS/MOOD NEUROLOGICAL BOWEL NUTRITION STATUS      Continent Diet (See DC summary)  AMBULATORY STATUS COMMUNICATION OF NEEDS Skin   Extensive Assist Verbally                          Personal Care Assistance Level of Assistance  Bathing, Feeding, Dressing Bathing Assistance: Maximum assistance Feeding assistance: Limited assistance Dressing Assistance: Maximum assistance     Functional Limitations Info  Sight, Hearing, Speech Sight Info: Impaired Hearing Info: Adequate Speech Info: Adequate    SPECIAL CARE FACTORS FREQUENCY  PT (By licensed PT)     PT Frequency: 5 times a week              Contractures Contractures Info: Not present    Additional Factors Info  Code Status, Allergies Code Status Info: FULL Allergies Info: Lisinopril  Hydrochlorothiazide  Lipitor (Atorvastatin Calcium)  Thiazide-type Diuretics  Zocor (Simvastatin)  Codeine           Current Medications (03/01/2023):  This is the current hospital active medication list Current Facility-Administered Medications  Medication Dose Route Frequency Provider Last Rate Last Admin   0.9 %  sodium chloride infusion   Intravenous Continuous Vassie Loll, MD 75 mL/hr at 03/01/23 0523 New Bag at 03/01/23 0523   acetaminophen (TYLENOL) tablet 650 mg  650 mg Oral Q6H PRN Vassie Loll, MD       Or   acetaminophen (TYLENOL) suppository 650 mg  650 mg Rectal Q6H PRN Vassie Loll, MD       ALPRAZolam Prudy Feeler) tablet 0.25 mg  0.25 mg Oral BID PRN Vassie Loll, MD       aspirin EC tablet 81 mg  81 mg Oral BID Vassie Loll, MD  81 mg at 03/01/23 1000   busPIRone (BUSPAR) tablet 5 mg  5 mg Oral BID Vassie Loll, MD   5 mg at 03/01/23 1001   carvedilol (COREG) tablet 25 mg  25 mg Oral BID WC Vassie Loll, MD   25 mg at 03/01/23 1002   cloNIDine (CATAPRES) tablet 0.2 mg  0.2 mg Oral BID Vassie Loll, MD   0.2 mg at 03/01/23 1001   ezetimibe (ZETIA) tablet 10 mg  10 mg Oral Daily Vassie Loll, MD   10 mg at 03/01/23 1002   feeding supplement (ENSURE ENLIVE / ENSURE PLUS) liquid 237 mL  237 mL Oral BID BM Vassie Loll, MD   237 mL at 02/28/23 1608   gabapentin (NEURONTIN) capsule 300 mg   300 mg Oral Daily Vassie Loll, MD   300 mg at 03/01/23 1001   heparin injection 5,000 Units  5,000 Units Subcutaneous Q8H Vassie Loll, MD   5,000 Units at 03/01/23 0515   hydrALAZINE (APRESOLINE) tablet 12.5 mg  12.5 mg Oral Q8H Vassie Loll, MD   12.5 mg at 03/01/23 0514   HYDROmorphone (DILAUDID) injection 0.5 mg  0.5 mg Intravenous Q6H PRN Vassie Loll, MD       methocarbamol (ROBAXIN) tablet 500 mg  500 mg Oral Q8H PRN Vassie Loll, MD       ondansetron [facility]) tablet 4 mg  4 mg Oral Q6H PRN Vassie Loll, MD       Or   ondansetron [facility]) injection 4 mg  4 mg Intravenous Q6H PRN Vassie Loll, MD       oxyCODONE (Oxy IR/ROXICODONE) immediate release tablet 5 mg  5 mg Oral Q6H PRN Vassie Loll, MD       pantoprazole (PROTONIX) EC tablet 40 mg  40 mg Oral Daily Vassie Loll, MD   40 mg at 03/01/23 1001     Discharge Medications: Please see discharge summary for a list of discharge medications.  Relevant Imaging Results:  Relevant Lab Results:   Additional Information SS# [id number]  Leitha Bleak, RN

## 2023-03-01 NOTE — Plan of Care (Signed)
  Problem: Acute Rehab PT Goals(only PT should resolve) Goal: Pt Will Go Supine/Side To Sit Outcome: Progressing Flowsheets (Taken 03/01/2023 1513) Pt will go Supine/Side to Sit: with supervision Goal: Pt Will Go Sit To Supine/Side Outcome: Progressing Flowsheets (Taken 03/01/2023 1513) Pt will go Sit to Supine/Side: with supervision Goal: Patient Will Transfer Sit To/From Stand Outcome: Progressing Flowsheets (Taken 03/01/2023 1513) Patient will transfer sit to/from stand:  with supervision  with min guard assist Goal: Pt Will Transfer Bed To Chair/Chair To Bed Outcome: Progressing Flowsheets (Taken 03/01/2023 1513) Pt will Transfer Bed to Chair/Chair to Bed:  with supervision  min guard assist Goal: Pt Will Ambulate Outcome: Progressing Flowsheets (Taken 03/01/2023 1513) Pt will Ambulate:  100 feet  with min guard assist  with least restrictive assistive device Goal: Pt/caregiver will Perform Home Exercise Program Outcome: Progressing Flowsheets (Taken 03/01/2023 1513) Pt/caregiver will Perform Home Exercise Program:  For increased strengthening  For improved balance  Independently   3:14 PM, 03/01/23 Wyman Songster PT, DPT Physical Therapist at [facility]

## 2023-03-01 NOTE — Progress Notes (Signed)
Progress Note   Patient: [name] ZOX:[id number] DOB: 04-27-1932 DOA: 02/28/2023     1 DOS: the patient was seen and examined on 03/01/2023   Brief hospital admission narrative: [name] is a 89 y.o. female with medical history significant of history of asthma, anxiety, chronic renal failure stage IIIb/IV, hypertension, mixed hyperlipidemia and osteoarthritis; presented to the hospital secondary to generalized weakness and failure to thrive.  Symptoms have been present for the last 4 days or so; when patient was found by EMS living condition was very deplorable and the house was unkept.  Patient oriented x 1-2 intermittently only and not able to contribute much to history. -Per records and reports provided at time of admission decreased oral intake will continue home medications leading to severe dehydration and failure to thrive.  Patient has been complaining of shoulders knees and hip pain.   No chest pain, no nausea, no vomiting, no shortness of breath patient denies dysuria or any particular focal deficits.   Workup in the ED demonstrating negative CT head for acute intracranial abnormality; positive chronic frontal lobe stroke.  Urinalysis not demonstrating acute infection; chest x-ray without acute cardiopulmonary process.  Basic metabolic panel with positive increase BUN and creatinine and also mild hyperkalemia.  Patient will be admitted due to acute on chronic renal failure along with dehydration and failure to thrive.  Assessment and Plan: * Acute on chronic renal failure (HCC) -Workup not demonstrating signs of acute UTI -Patient's history and presentation suggesting prerenal azotemia, due to dehydration and continue use of nephrotoxic agents -Continue to avoid the use of NSAIDs and any other nephrotoxic agents as much as possible -Continue to hold diuretics -Continue fluid resuscitation and follow renal function trend; creatinine adequately improving and patient's condition  stabilizing. -Patient with underlying history of stage IIIb/IV renal failure. -No hydronephrosis or bladder outlet obstruction appreciated on CT scan at time of admission.   Mixed hyperlipidemia -continue heart healthy diet and continue tx with zetia.  CVA (cerebral vascular accident) (HCC) -Chronic findings after reviewing CT head without contrast -Continue risk factor modifications -Continue aspirin for secondary prevention. -No focal neurologic deficits appreciated at the moment.  Adrenal gland cyst (HCC) -Incidental findings during CT abdomen and pelvis -Follow radiology recommendations repeat images for his stability in the next 6-12 months has been recommended. -No acute red flags reported.  OA (osteoarthritis) -Involving shoulders, knees and hip -Status post knee replacement and hip surgery -Continue as needed analgesics and follow recommendations by PT services -Patient expresses to be all pending to the idea of rehabilitation at a skilled nursing facility.  Anxiety -Continue the use of BuSpar and as needed Xanax.  HTN (hypertension) -Resume home antihypertensive agents with exception of nephrotoxic medications and diuretics -Continue to maintain adequate hydration and continue heart healthy diet.  Transaminitis: -LFTs improving with fluid resuscitation -Continue to follow trend. -Avoid the use of hepatotoxic agents.  Hyperkalemia -Resolved; current potassium within normal limits -Continue to follow trend.  -Safe to DC telemetry.  Subjective:  Chronically ill in appearance and deconditioned; no fever, no chest pain, no nausea or vomiting.  Physical Exam: Vitals:   02/28/23 2016 03/01/23 0356 03/01/23 0958 03/01/23 1206  BP: (!) 167/67 (!) 148/57 (!) 168/62 (!) 130/56  Pulse: 73 75 92 74  Resp:  18 16 18   Temp: 98.7 F (37.1 C) 98.4 F (36.9 C) 98.9 F (37.2 C) 98.4 F (36.9 C)  TempSrc: Oral Oral Oral   SpO2: 96% 97% 97% 98%  Weight:      Height:        General exam: Alert, awake, oriented x 2 (oriented to person and place); denying chest pain, shortness of breath, nausea and vomiting.  Feeling better and more interactive. Respiratory system: Good air movement bilaterally; no using accessory muscles. Cardiovascular system:RRR. No rubs or gallops; no JVD. Gastrointestinal system: Abdomen is nondistended, soft and nontender. No organomegaly or masses felt. Normal bowel sounds heard. Central nervous system: Alert and oriented. No focal neurological deficits. Extremities: No cyanosis or clubbing. Skin: No petechiae. Psychiatry: Mood & affect appropriate.   Data Reviewed: TSH: 0.720 Phosphorus 4.8 Magnesium 2.9 MRSA PCR: Detected CK level: 978 CBC: WBC 7.0, hemoglobin 9.2 and platelet count 211 K Comprehensive metabolic panel: Sodium 139, potassium 4.4, chloride 110, bicarb 20, BUN 69, creatinine 1.83, AST 197, ALT 322, GFR 26 and anion gap 9   Family Communication: No family at bedside.  Son updated at time of admission by palliative care service.  Disposition: Status is: Inpatient Remains inpatient appropriate because: Continue IV fluids. Her renal function   Planned Discharge Destination: Skilled nursing facility short term rehab.   Time spent: 35 minutes  Author: Vassie Loll, MD 03/01/2023 4:01 PM  For on call review [email or web address].

## 2023-03-01 NOTE — Progress Notes (Signed)
Transition of Care [facility]) - Inpatient Brief Assessment   Patient Details  Name: [name] MRN: [id number] Date of Birth: Mar 06, 1932  Transition of Care [facility] CM/SW Contact:    Leitha Bleak, RN Phone Number: 03/01/2023, 3:04 PM   Clinical Narrative: Patient admitted with acute on chronic renal failure. TOC waiting PT eval. Chart review looks like patient is needs SNF.  TOC sending out for bed offers. TOC following.    Transition of Care Asessment: Insurance and Status: (P) Insurance coverage has been reviewed Patient has primary care physician: (P) Yes Home environment has been reviewed: (P) HOme with daughter Prior level of function:: (P) needs help Prior/Current Home Services: (P) No current home services Social Determinants of Health Reivew: (P) SDOH reviewed no interventions necessary Readmission risk has been reviewed: (P) Yes Transition of care needs: (P) transition of care needs identified, TOC will continue to follow

## 2023-03-01 NOTE — Progress Notes (Signed)
Pt slept through the night. SBP elevated, DBP low, other vitals stable. Pt disoriented and has exited bed 3x to use restroom. PIV was removed by pt, new PIV placed in L hand. Bed alarm is on, bed in lowest position, call bell within reach.

## 2023-03-02 DIAGNOSIS — N949 Unspecified condition associated with female genital organs and menstrual cycle: Secondary | ICD-10-CM | POA: Diagnosis not present

## 2023-03-02 DIAGNOSIS — E782 Mixed hyperlipidemia: Secondary | ICD-10-CM | POA: Diagnosis not present

## 2023-03-02 DIAGNOSIS — N179 Acute kidney failure, unspecified: Secondary | ICD-10-CM | POA: Diagnosis not present

## 2023-03-02 DIAGNOSIS — M158 Other polyosteoarthritis: Secondary | ICD-10-CM | POA: Diagnosis not present

## 2023-03-02 LAB — BASIC METABOLIC PANEL
Anion gap: 7 (ref 5–15)
BUN: 35 mg/dL — ABNORMAL HIGH (ref 8–23)
CO2: 21 mmol/L — ABNORMAL LOW (ref 22–32)
Calcium: 8.4 mg/dL — ABNORMAL LOW (ref 8.9–10.3)
Chloride: 111 mmol/L (ref 98–111)
Creatinine, Ser: 0.94 mg/dL (ref 0.44–1.00)
GFR, Estimated: 58 mL/min — ABNORMAL LOW (ref >60)
Glucose, Bld: 110 mg/dL — ABNORMAL HIGH (ref 70–99)
Potassium: 4.3 mmol/L (ref 3.5–5.1)
Sodium: 139 mmol/L (ref 135–145)

## 2023-03-02 MED ORDER — HYDRALAZINE HCL 25 MG PO TABS
25.0000 mg | ORAL_TABLET | Freq: Three times a day (TID) | ORAL | Status: DC
  Administered 2023-03-02 – 2023-03-04 (×6): 25 mg via ORAL
  Filled 2023-03-02 (×6): qty 1

## 2023-03-02 MED ORDER — LOPERAMIDE HCL 2 MG PO CAPS: 2.0000 mg | ORAL_CAPSULE | Freq: Four times a day (QID) | ORAL | Status: DC | PRN

## 2023-03-02 MED ORDER — LABETALOL HCL 5 MG/ML IV SOLN
20.0000 mg | Freq: Once | INTRAVENOUS | Status: AC
  Administered 2023-03-02: 20 mg via INTRAVENOUS
  Filled 2023-03-02: qty 4

## 2023-03-02 MED ORDER — ACETAMINOPHEN 325 MG PO TABS
ORAL_TABLET | ORAL | Status: AC
  Filled 2023-03-02: qty 1

## 2023-03-02 MED ORDER — LABETALOL HCL 5 MG/ML IV SOLN
20.0000 mg | Freq: Four times a day (QID) | INTRAVENOUS | Status: DC | PRN
  Administered 2023-03-03 – 2023-03-04 (×4): 20 mg via INTRAVENOUS
  Filled 2023-03-02 (×4): qty 4

## 2023-03-02 MED ORDER — SACCHAROMYCES BOULARDII 250 MG PO CAPS
250.0000 mg | ORAL_CAPSULE | Freq: Two times a day (BID) | ORAL | Status: DC
  Administered 2023-03-02 – 2023-03-05 (×7): 250 mg via ORAL
  Filled 2023-03-02 (×7): qty 1

## 2023-03-02 NOTE — Progress Notes (Signed)
Patient rested through the night, BP elevated, scheduled hydralazine given

## 2023-03-02 NOTE — Progress Notes (Signed)
   03/02/23 1406  Vitals  Temp 98.2 F (36.8 C)  BP (!) 192/68  MAP (mmHg) 102  Pulse Rate 74  Resp 16  Level of Consciousness  Level of Consciousness Alert  MEWS COLOR  MEWS Score Color Green  Oxygen Therapy  SpO2 100 %  O2 Device Room Air  MEWS Score  MEWS Temp 0  MEWS Systolic 0  MEWS Pulse 0  MEWS RR 0  MEWS LOC 0  MEWS Score 0   MD [place] notified. Scheduled hydralazine given.

## 2023-03-02 NOTE — TOC Progression Note (Signed)
Transition of Care [facility]) - Progression Note    Patient Details  Name: [name] MRN: [id number] Date of Birth: June 03, 1932  Transition of Care [facility]) CM/SW Contact  Catalina Gravel, LCSW Phone Number: 03/02/2023, 11:51 AM  Clinical Narrative:    CSW contacted son, POA.  PT recommended SNF placement.  Son in [place] indicates pt cannot return home to rehabilitate.  Shared that there are roaches and bed bugs also at home, not a safe place for DC and care.  CSW started insurance Auth/pending. [facility] was a preferred facility- no beds available.  JC was accepted by son as CSW contacted him a second time to advise approval. Shared that Auth pending. TOC to follow.      Barriers to Discharge: Continued Medical Work up  Expected Discharge Plan and Services                                               Social Determinants of Health (SDOH) Interventions SDOH Screenings   Food Insecurity: No Food Insecurity (02/28/2023)  Housing: Low Risk  (02/28/2023)  Transportation Needs: No Transportation Needs (02/28/2023)  Utilities: Not At Risk (02/28/2023)  Tobacco Use: Medium Risk (02/28/2023)    Readmission Risk Interventions    09/25/2022    9:57 AM  Readmission Risk Prevention Plan  Post Dischage Appt Complete  Medication Screening Complete  Transportation Screening Complete

## 2023-03-02 NOTE — Progress Notes (Signed)
   03/02/23 0804  Vitals  BP (!) 217/78  MEWS COLOR  MEWS Score Color Yellow  MEWS Score  MEWS Temp 0  MEWS Systolic 2  MEWS Pulse 0  MEWS RR 0  MEWS LOC 0  MEWS Score 2   MD [place] notified. Scheduled clonidine given. Will recheck

## 2023-03-02 NOTE — Progress Notes (Signed)
Progress Note   Patient: [name] YNW:[id number] DOB: 1931/09/26 DOA: 02/28/2023     2 DOS: the patient was seen and examined on 03/02/2023   Brief hospital admission narrative: Mylie Sparby Frick is a 89 y.o. female with medical history significant of history of asthma, anxiety, chronic renal failure stage IIIb/IV, hypertension, mixed hyperlipidemia and osteoarthritis; presented to the hospital secondary to generalized weakness and failure to thrive.  Symptoms have been present for the last 4 days or so; when patient was found by EMS living condition was very deplorable and the house was unkept.  Patient oriented x 1-2 intermittently only and not able to contribute much to history. -Per records and reports provided at time of admission decreased oral intake will continue home medications leading to severe dehydration and failure to thrive.  Patient has been complaining of shoulders knees and hip pain.   No chest pain, no nausea, no vomiting, no shortness of breath patient denies dysuria or any particular focal deficits.   Workup in the ED demonstrating negative CT head for acute intracranial abnormality; positive chronic frontal lobe stroke.  Urinalysis not demonstrating acute infection; chest x-ray without acute cardiopulmonary process.  Basic metabolic panel with positive increase BUN and creatinine and also mild hyperkalemia.  Patient will be admitted due to acute on chronic renal failure along with dehydration and failure to thrive.  Assessment and Plan: * Acute on chronic renal failure (HCC) -Workup not demonstrating signs of acute UTI -Patient's history and presentation suggesting prerenal azotemia, due to dehydration and continue use of nephrotoxic agents -Continue to avoid the use of NSAIDs and any other nephrotoxic agents as much as possible -Continue to hold diuretics and continue to maintain adequate hydration. -At this moment renal function has improved and is back to  baseline. -Stopping IV fluid resuscitation; patient advised to maintain good oral intake. -No hydronephrosis or bladder outlet obstruction appreciated on CT scan at time of admission.   Mixed hyperlipidemia -continue heart healthy diet and continue tx with zetia.  CVA (cerebral vascular accident) (HCC) -Chronic findings after reviewing CT head without contrast -Continue risk factor modifications -Continue aspirin for secondary prevention. -No focal neurologic deficits appreciated at the moment.  Adrenal gland cyst (HCC) -Incidental findings during CT abdomen and pelvis -Follow radiology recommendations repeat images for his stability in the next 6-12 months has been recommended. -No acute red flags reported.  OA (osteoarthritis) -Involving shoulders, knees and hip -Status post knee replacement and hip surgery -Continue as needed analgesics and follow recommendations by PT services -Patient expresses to be all pending to the idea of rehabilitation at a skilled nursing facility.  Anxiety -Continue the use of BuSpar and as needed Xanax.  HTN (hypertension) -Resume home antihypertensive agents with exception of nephrotoxic medications and diuretics -Continue to maintain adequate hydration and continue heart healthy diet.  Transaminitis: -LFTs improving with fluid resuscitation -Continue to follow trend intermittently. -Avoid the use of hepatotoxic agents.  Hyperkalemia -Resolved; current potassium within normal limits -Continue to follow trend.   Diarrhea/loose stools -Florastor and as needed loperamide will be provided. -Patient denies nausea, vomiting or abdominal pain.  Subjective:  Reports feeling better, breathing easier and denying any pain.  Patient expressing loose stools and + difficulty making it to the bathroom secondary to physical deconditioning.  Good urine output.  Physical Exam: Vitals:   03/02/23 0606 03/02/23 0804 03/02/23 0940 03/02/23 1406  BP: (!)  176/59 (!) 217/78 (!) 151/58 (!) 192/68  Pulse: 79  79 74  Resp:    16  Temp:    98.2 F (36.8 C)  TempSrc:      SpO2:   100% 100%  Weight:      Height:       General exam: Alert, awake, oriented x 2; reports feeling weak, deconditioned and experiencing loose stools.  No chest pain, no nausea, no vomiting. Respiratory system: Good air movement bilaterally; no using accessory muscles. Cardiovascular system:RRR. No rubs or gallops; no JVD. Gastrointestinal system: Abdomen is nondistended, soft and nontender. No organomegaly or masses felt. Normal bowel sounds heard. Central nervous system: No focal neurological deficits. Extremities: No cyanosis or clubbing. Skin: No petechiae. Psychiatry: Mood and affect appropriate.  Data Reviewed: Basic metabolic panel: Sodium 139, potassium 4.3, chloride 111, bicarb 21, BUN 35, creatinine 0.94 and GFR 58   Family Communication: No family at bedside.  Son has been updated by [facility] on 03/02/2023.  Disposition: Status is: Inpatient Remains inpatient appropriate because: Continue supportive care and await insurance authorization for placement.   Planned Discharge Destination: Skilled nursing facility short term rehab.   Time spent: 35 minutes  Author: Vassie Loll, MD 03/02/2023 3:54 PM  For on call review [email or web address].

## 2023-03-02 NOTE — Plan of Care (Signed)

## 2023-03-02 NOTE — Progress Notes (Signed)
BP trending down after hydralazine administration

## 2023-03-03 ENCOUNTER — Inpatient Hospital Stay (HOSPITAL_COMMUNITY): Payer: Medicare Other

## 2023-03-03 ENCOUNTER — Other Ambulatory Visit (HOSPITAL_COMMUNITY): Payer: Self-pay | Admitting: *Deleted

## 2023-03-03 DIAGNOSIS — I480 Paroxysmal atrial fibrillation: Secondary | ICD-10-CM

## 2023-03-03 DIAGNOSIS — I4891 Unspecified atrial fibrillation: Secondary | ICD-10-CM | POA: Diagnosis not present

## 2023-03-03 DIAGNOSIS — N179 Acute kidney failure, unspecified: Secondary | ICD-10-CM | POA: Diagnosis not present

## 2023-03-03 DIAGNOSIS — E782 Mixed hyperlipidemia: Secondary | ICD-10-CM | POA: Diagnosis not present

## 2023-03-03 DIAGNOSIS — R531 Weakness: Secondary | ICD-10-CM | POA: Diagnosis not present

## 2023-03-03 DIAGNOSIS — N949 Unspecified condition associated with female genital organs and menstrual cycle: Secondary | ICD-10-CM | POA: Diagnosis not present

## 2023-03-03 DIAGNOSIS — N1832 Chronic kidney disease, stage 3b: Secondary | ICD-10-CM

## 2023-03-03 LAB — COMPREHENSIVE METABOLIC PANEL
ALT: 247 U/L — ABNORMAL HIGH (ref 0–44)
AST: 72 U/L — ABNORMAL HIGH (ref 15–41)
Albumin: 3 g/dL — ABNORMAL LOW (ref 3.5–5.0)
Alkaline Phosphatase: 57 U/L (ref 38–126)
Anion gap: 11 (ref 5–15)
BUN: 18 mg/dL (ref 8–23)
CO2: 20 mmol/L — ABNORMAL LOW (ref 22–32)
Calcium: 8.5 mg/dL — ABNORMAL LOW (ref 8.9–10.3)
Chloride: 108 mmol/L (ref 98–111)
Creatinine, Ser: 0.85 mg/dL (ref 0.44–1.00)
GFR, Estimated: >60 mL/min (ref >60)
Glucose, Bld: 149 mg/dL — ABNORMAL HIGH (ref 70–99)
Potassium: 3.9 mmol/L (ref 3.5–5.1)
Sodium: 139 mmol/L (ref 135–145)
Total Bilirubin: 0.8 mg/dL (ref 0.3–1.2)
Total Protein: 6.5 g/dL (ref 6.5–8.1)

## 2023-03-03 LAB — ECHOCARDIOGRAM COMPLETE
AR max vel: 2.02 cm2
AV Area VTI: 2.2 cm2
AV Area mean vel: 2.3 cm2
AV Mean grad: 3.2 mmHg
AV Peak grad: 7.5 mmHg
Ao pk vel: 1.37 m/s
Area-P 1/2: 7.16 cm2
Height: 58 in
MV M vel: 5.31 m/s
MV Peak grad: 112.6 mmHg
S' Lateral: 3.2 cm
Weight: 2786.61 oz

## 2023-03-03 LAB — CBC
HCT: 34.6 % — ABNORMAL LOW (ref 36.0–46.0)
Hemoglobin: 11.1 g/dL — ABNORMAL LOW (ref 12.0–15.0)
MCH: 29.8 pg (ref 26.0–34.0)
MCHC: 32.1 g/dL (ref 30.0–36.0)
MCV: 93 fL (ref 80.0–100.0)
Platelets: 278 10*3/uL (ref 150–400)
RBC: 3.72 MIL/uL — ABNORMAL LOW (ref 3.87–5.11)
RDW: 14.2 % (ref 11.5–15.5)
WBC: 6.6 10*3/uL (ref 4.0–10.5)
nRBC: 0 % (ref 0.0–0.2)

## 2023-03-03 LAB — TROPONIN I (HIGH SENSITIVITY): Troponin I (High Sensitivity): 8 ng/L (ref <18)

## 2023-03-03 LAB — MAGNESIUM: Magnesium: 2 mg/dL (ref 1.7–2.4)

## 2023-03-03 MED ORDER — AMIODARONE HCL IN DEXTROSE 360-4.14 MG/200ML-% IV SOLN
60.0000 mg/h | INTRAVENOUS | Status: DC
  Administered 2023-03-03 (×2): 60 mg/h via INTRAVENOUS
  Filled 2023-03-03: qty 200

## 2023-03-03 MED ORDER — CHLORHEXIDINE GLUCONATE CLOTH 2 % EX PADS
6.0000 | MEDICATED_PAD | Freq: Every day | CUTANEOUS | Status: DC
  Administered 2023-03-03 – 2023-03-05 (×3): 6 via TOPICAL

## 2023-03-03 MED ORDER — METOPROLOL TARTRATE 5 MG/5ML IV SOLN
5.0000 mg | Freq: Once | INTRAVENOUS | Status: AC
  Administered 2023-03-03: 5 mg via INTRAVENOUS

## 2023-03-03 MED ORDER — METOPROLOL TARTRATE 5 MG/5ML IV SOLN
INTRAVENOUS | Status: AC
  Filled 2023-03-03: qty 5

## 2023-03-03 MED ORDER — HALOPERIDOL LACTATE 5 MG/ML IJ SOLN
2.0000 mg | Freq: Once | INTRAMUSCULAR | Status: AC
  Administered 2023-03-03: 2 mg via INTRAVENOUS

## 2023-03-03 MED ORDER — DILTIAZEM HCL 25 MG/5ML IV SOLN
10.0000 mg | Freq: Once | INTRAVENOUS | Status: AC
  Administered 2023-03-03: 10 mg via INTRAVENOUS

## 2023-03-03 MED ORDER — AMIODARONE LOAD VIA INFUSION
150.0000 mg | Freq: Once | INTRAVENOUS | Status: AC
  Administered 2023-03-03: 150 mg via INTRAVENOUS
  Filled 2023-03-03: qty 83.34

## 2023-03-03 MED ORDER — LORAZEPAM 2 MG/ML IJ SOLN
0.5000 mg | Freq: Once | INTRAMUSCULAR | Status: AC
  Administered 2023-03-03: 0.5 mg via INTRAVENOUS

## 2023-03-03 MED ORDER — CLONIDINE HCL 0.2 MG PO TABS
0.2000 mg | ORAL_TABLET | Freq: Three times a day (TID) | ORAL | Status: DC
  Administered 2023-03-03 – 2023-03-05 (×6): 0.2 mg via ORAL
  Filled 2023-03-03 (×6): qty 1

## 2023-03-03 MED ORDER — LORAZEPAM 2 MG/ML IJ SOLN
INTRAMUSCULAR | Status: AC
  Filled 2023-03-03: qty 1

## 2023-03-03 MED ORDER — HALOPERIDOL LACTATE 5 MG/ML IJ SOLN
0.5000 mg | Freq: Four times a day (QID) | INTRAMUSCULAR | Status: DC | PRN
  Filled 2023-03-03 (×2): qty 1

## 2023-03-03 MED ORDER — DILTIAZEM HCL 25 MG/5ML IV SOLN
INTRAVENOUS | Status: AC
  Filled 2023-03-03: qty 5

## 2023-03-03 MED ORDER — AMIODARONE HCL IN DEXTROSE 360-4.14 MG/200ML-% IV SOLN
30.0000 mg/h | INTRAVENOUS | Status: DC
  Filled 2023-03-03: qty 200

## 2023-03-03 MED ORDER — METOPROLOL TARTRATE 50 MG PO TABS
50.0000 mg | ORAL_TABLET | Freq: Two times a day (BID) | ORAL | Status: DC
  Administered 2023-03-03 – 2023-03-05 (×5): 50 mg via ORAL
  Filled 2023-03-03 (×5): qty 1

## 2023-03-03 NOTE — Progress Notes (Signed)
*  PRELIMINARY RESULTS* Echocardiogram 2D Echocardiogram has been performed.  [name] 03/03/2023, 2:33 PM

## 2023-03-03 NOTE — Progress Notes (Signed)
Afib with RVR. Rate in the 160s. Briefly responded to metoprolol, then HR spiked again. No response to cardizem. Transferred to stepdown for possible drip. Patient did have 4 second pause after cardizem per tele. Patient is confused, and thinks the staff is going to harm her in some way (although laughing about it when she says this). Does not believe she is in a hospital. Ativan ordered for anxiety. Trop 8. Mag pending. K+ WNL. Once in stepdown, consider amiodarone. Echo in the AM.

## 2023-03-03 NOTE — Progress Notes (Signed)
Pt confusing, combative, screaming out, pulling at tubes. States, "I am not staying here. I am going home." All attempts of calming pt unsuccessful. Dr. Carren Rang notified. Orders received.

## 2023-03-03 NOTE — Progress Notes (Signed)
Progress Note   Patient: [name]:[id number] DOB: Jun 14, 1932 DOA: 02/28/2023     3 DOS: the patient was seen and examined on 03/03/2023   Brief hospital admission narrative: [name] is a 89 y.o. female with medical history significant of history of asthma, anxiety, chronic renal failure stage IIIb/IV, hypertension, mixed hyperlipidemia and osteoarthritis; presented to the hospital secondary to generalized weakness and failure to thrive.  Symptoms have been present for the last 4 days or so; when patient was found by EMS living condition was very deplorable and the house was unkept.  Patient oriented x 1-2 intermittently only and not able to contribute much to history. -Per records and reports provided at time of admission decreased oral intake will continue home medications leading to severe dehydration and failure to thrive.  Patient has been complaining of shoulders knees and hip pain.   No chest pain, no nausea, no vomiting, no shortness of breath patient denies dysuria or any particular focal deficits.   Workup in the ED demonstrating negative CT head for acute intracranial abnormality; positive chronic frontal lobe stroke.  Urinalysis not demonstrating acute infection; chest x-ray without acute cardiopulmonary process.  Basic metabolic panel with positive increase BUN and creatinine and also mild hyperkalemia.  Patient will be admitted due to acute on chronic renal failure along with dehydration and failure to thrive.  Assessment and Plan: * Acute on chronic renal failure (HCC) -Workup not demonstrating signs of acute UTI -Patient's history and presentation suggesting prerenal azotemia, due to dehydration and continue use of nephrotoxic agents -Continue to avoid the use of NSAIDs and any other nephrotoxic agents as much as possible -Continue to hold diuretics and continue to maintain adequate hydration. -At this moment renal function has improved and is back to  baseline. -Stopping IV fluid resuscitation; patient advised to maintain good oral intake. -No hydronephrosis or bladder outlet obstruction appreciated on CT scan at time of admission.   Mixed hyperlipidemia -continue heart healthy diet and continue tx with zetia.  CVA (cerebral vascular accident) (HCC) -Chronic findings after reviewing CT head without contrast -Continue risk factor modifications -Continue aspirin for secondary prevention. -No focal neurologic deficits appreciated at the moment.  Adrenal gland cyst (HCC) -Incidental findings during CT abdomen and pelvis -Follow radiology recommendations repeat images for his stability in the next 6-12 months has been recommended. -No acute red flags reported.  OA (osteoarthritis) -Involving shoulders, knees and hip -Status post knee replacement and hip surgery -Continue as needed analgesics and follow recommendations by PT services -Patient expresses to be all pending to the idea of rehabilitation at a skilled nursing facility.  Anxiety -Continue the use of BuSpar and as needed Xanax.  HTN (hypertension) -Resume home antihypertensive agents with exception of nephrotoxic medications and diuretics -Continue to maintain adequate hydration and continue heart healthy diet.  New onset A-fib with RVR  -Patient auto converted back to using amiodarone -Currently rate controlled in sinus rhythm. -Will transition off amiodarone and start patient on metoprolol -Follow-up echo results and assist further rate/rhythm response.  Transaminitis: -LFTs improving antistaphylolysin. -Continue to follow trend intermittently. -Avoid the use of hepatotoxic agents.  Hyperkalemia -Resolved; current potassium within normal limits -Continue to follow trend.   Diarrhea/loose stools -Florastor and as needed loperamide will be provided. -Patient denies nausea, vomiting or abdominal pain. -No further episode of diarrhea currently  reported.  Subjective:  Afebrile, no chest pain, no nausea or vomiting.  Overnight with new onset A-fib with transfer to ICU for  further management.  Restless, anxious and oriented x 1  Physical Exam: Vitals:   03/03/23 0604 03/03/23 0735 03/03/23 0911 03/03/23 0918  BP: (!) 161/124  (!) 208/89 (!) 183/62  Pulse:    80  Resp:    19  Temp:  98.4 F (36.9 C)    TempSrc:  Oral    SpO2:    99%  Weight:      Height:       General exam: Alert, awake, oriented x 1; experiencing episodes of restlessness, agitation and pulling on IV lines, telemetry and other equipment.  Patient is high risk for falls. Respiratory system: No using accessory muscles; good saturation on room air. Cardiovascular system: Rate controlled, no rubs, no gallops, no JVD. Gastrointestinal system: Abdomen is nondistended, soft and nontender. No organomegaly or masses felt. Normal bowel sounds heard. Central nervous system: Alert and oriented. No focal neurological deficits. Extremities: No cyanosis, no clubbing, no edema. Skin: No petechiae. Psychiatry: Judgement and insight appear impaired secondary to underlying dementia with acute hospital-acquired delirium process.  Restless and anxious at time of examination.  Data Reviewed: CBC: WBCs 6.6, hemoglobin 11.1 platelet count 278 K Comprehensive metabolic panel: Sodium 139, potassium 3.9, chloride 108, bicarb 20, BUN 18, creatinine 0.85, albumin 3.0, AST 72, ALT 247 and GFR > 60 with a creatinine clearance of 39.7  Family Communication: No family at bedside.    Disposition: Status is: Inpatient Remains inpatient appropriate because: Continue supportive care and await insurance authorization for placement.  Patient overnight in the developing A-fib with RVR and transferred to ICU for rate control management.  Hospital-acquired delirium also appreciated.   Planned Discharge Destination: Skilled nursing facility short term rehab.  Time spent: 50  minutes  Author: Vassie Loll, MD 03/03/2023 10:19 AM  For on call review [email or web address].

## 2023-03-03 NOTE — Progress Notes (Signed)
Patient refusing to wear telemetry box, education provided on importance, patient still refusing after education provided

## 2023-03-03 NOTE — Progress Notes (Signed)
Patient transferred to ICU, report called to [place],RN

## 2023-03-03 NOTE — Progress Notes (Signed)
Patient attempted to get out bed, while answering bed alarm, patient stated she was having terrible chest pain. EKG obtained and showing AFIB RVR, BP 153/109. Dr. Carren Rang notified, rapid response called. Metoprolol IV push ordered and given, still showing AFIB RVR BP 169/134, ativan and Caridizem push ordered, both given.

## 2023-03-04 DIAGNOSIS — E782 Mixed hyperlipidemia: Secondary | ICD-10-CM | POA: Diagnosis not present

## 2023-03-04 DIAGNOSIS — Z7189 Other specified counseling: Secondary | ICD-10-CM | POA: Diagnosis not present

## 2023-03-04 DIAGNOSIS — N1832 Chronic kidney disease, stage 3b: Secondary | ICD-10-CM | POA: Diagnosis not present

## 2023-03-04 DIAGNOSIS — Z515 Encounter for palliative care: Secondary | ICD-10-CM | POA: Diagnosis not present

## 2023-03-04 DIAGNOSIS — N949 Unspecified condition associated with female genital organs and menstrual cycle: Secondary | ICD-10-CM | POA: Diagnosis not present

## 2023-03-04 DIAGNOSIS — R531 Weakness: Secondary | ICD-10-CM | POA: Diagnosis not present

## 2023-03-04 DIAGNOSIS — N179 Acute kidney failure, unspecified: Secondary | ICD-10-CM | POA: Diagnosis not present

## 2023-03-04 MED ORDER — HYDRALAZINE HCL 20 MG/ML IJ SOLN
10.0000 mg | Freq: Once | INTRAMUSCULAR | Status: AC
  Administered 2023-03-04: 10 mg via INTRAVENOUS
  Filled 2023-03-04: qty 1

## 2023-03-04 MED ORDER — HYDRALAZINE HCL 25 MG PO TABS
50.0000 mg | ORAL_TABLET | Freq: Three times a day (TID) | ORAL | Status: DC
  Administered 2023-03-04 – 2023-03-05 (×4): 50 mg via ORAL
  Filled 2023-03-04 (×4): qty 2

## 2023-03-04 MED ORDER — QUETIAPINE FUMARATE 25 MG PO TABS
12.5000 mg | ORAL_TABLET | Freq: Two times a day (BID) | ORAL | Status: DC
  Administered 2023-03-04 – 2023-03-05 (×3): 12.5 mg via ORAL
  Filled 2023-03-04 (×3): qty 1

## 2023-03-04 NOTE — TOC Progression Note (Addendum)
Transition of Care [facility]) - Progression Note    Patient Details  Name: [name] MRN: [id number] Date of Birth: 06-18-1932  Transition of Care [facility]) CM/SW Contact  Leitha Bleak, RN Phone Number: 03/04/2023, 2:20 PM  Clinical Narrative:   Palliative and INS Auth pending.    Barriers to Discharge: Continued Medical Work up  Expected Discharge Plan and Services    [place]

## 2023-03-04 NOTE — Progress Notes (Signed)
Palliative: Mrs. Bennetts is sitting up in the [place] chair in her room.  She appears chronically ill and somewhat frail.  She greets me, making and somewhat keeping eye contact.  She is alert and oriented to person and month, but tells me that we are in a place that is "supposed to be a hospital but is not".  I believe that she can make her basic needs known.  There is no family at bedside at this time.  At this point Mrs. Tyer would qualify for short-term rehab.  It seems that she has been living in a home owned by her son with her daughter Babette Relic living there also.  Thereasa Distance shares a story of mental decline over the last 45 to 60 days.  He states that she does indeed have memory loss and the last few times they visited she did not even recognize Thereasa Distance.  He states that he feels that she can no longer live independently and would need rehab with the possible transition to long-term care.    We talked about the benefits of outpatient palliative services.  They readily agreed.  We talk about CODE STATUS.  Running quickly endorses DNR.  Orders adjusted.    Conference with bedside nursing staff and transition of care team related to patient condition, needs, goals of care, disposition.  Plan:    Continue to treat the treatable but no CPR or intubation.  Time for outcomes.  Short-term rehab at [facility] with possible need for long-term care.  Outpatient palliative services through [facility].  50 minutes  Lillia Carmel, NP Palliative medicine team Team phone [phone] Greater than 50% of this time was spent counseling and coordinating care related to the above assessment and plan.

## 2023-03-04 NOTE — Progress Notes (Signed)
Progress Note   Patient: [name] LKG:[id number] DOB: November 28, 1931 DOA: 02/28/2023     4 DOS: the patient was seen and examined on 03/04/2023   Brief hospital admission narrative: Melonia Haw Hartgrove is a 89 y.o. female with medical history significant of history of asthma, anxiety, chronic renal failure stage IIIb/IV, hypertension, mixed hyperlipidemia and osteoarthritis; presented to the hospital secondary to generalized weakness and failure to thrive.  Symptoms have been present for the last 4 days or so; when patient was found by EMS living condition was very deplorable and the house was unkept.  Patient oriented x 1-2 intermittently only and not able to contribute much to history. -Per records and reports provided at time of admission decreased oral intake will continue home medications leading to severe dehydration and failure to thrive.  Patient has been complaining of shoulders knees and hip pain.   No chest pain, no nausea, no vomiting, no shortness of breath patient denies dysuria or any particular focal deficits.   Workup in the ED demonstrating negative CT head for acute intracranial abnormality; positive chronic frontal lobe stroke.  Urinalysis not demonstrating acute infection; chest x-ray without acute cardiopulmonary process.  Basic metabolic panel with positive increase BUN and creatinine and also mild hyperkalemia.  Patient will be admitted due to acute on chronic renal failure along with dehydration and failure to thrive.  Assessment and Plan: * Acute on chronic renal failure (HCC) -Workup not demonstrating signs of acute UTI -Patient's history and presentation suggesting prerenal azotemia, due to dehydration and continue use of nephrotoxic agents -Continue to avoid the use of NSAIDs and any other nephrotoxic agents as much as possible -Continue to hold diuretics and continue to maintain adequate hydration. -At this moment renal function has improved and is back to  baseline. -Stopping IV fluid resuscitation; patient advised to maintain good oral intake. -No hydronephrosis or bladder outlet obstruction appreciated on CT scan at time of admission.   Mixed hyperlipidemia -continue heart healthy diet and continue tx with zetia.  CVA (cerebral vascular accident) (HCC) -Chronic findings after reviewing CT head without contrast -Continue risk factor modifications -Continue aspirin for secondary prevention. -No focal neurologic deficits appreciated at the moment.  Adrenal gland cyst (HCC) -Incidental findings during CT abdomen and pelvis -Follow radiology recommendations repeat images for his stability in the next 6-12 months has been recommended. -No acute red flags reported.  OA (osteoarthritis) -Involving shoulders, knees and hip -Status post knee replacement and hip surgery -Continue as needed analgesics and follow recommendations by PT services -Patient expresses to be all pending to the idea of rehabilitation at a skilled nursing facility.  Anxiety -Continue the use of BuSpar and as needed Xanax.  HTN (hypertension) -Resume home antihypertensive agents with exception of nephrotoxic medications and diuretics -Continue to maintain adequate hydration and continue heart healthy diet.  New onset A-fib with RVR  -Patient auto converted back after using amiodarone on 03/03/2023. -Successfully transitioned off amiodarone and with good rate control while using metoprolol. -Echo demonstrated preserved ejection fraction and no wall motion abnormalities.  Diastolic dysfunction appreciated (currently compensated).  Transaminitis: -LFTs improving antistaphylolysin. -Continue to follow trend intermittently. -Avoid the use of hepatotoxic agents.  Hyperkalemia -Resolved; current potassium within normal limits -Continue to follow trend.   Hospital-acquired delirium/dementia -Continue supportive care, constant reorientation and assistance -Seroquel  twice a day will be started -Follow response -Patient with most likely component of vascular dementia (given prior history of stroke).  Diarrhea/loose stools -Florastor and as needed loperamide  will be provided. -Patient denies nausea, vomiting or abdominal pain. -No further episode of diarrhea currently reported.  Subjective:  No fever, no chest pain, no nausea, no vomiting.  Patient reports no palpitations and is currently rate control and not experiencing major distress.  Restlessness and delirium ongoing.  Physical Exam: Vitals:   03/04/23 0805 03/04/23 0900 03/04/23 1000 03/04/23 1100  BP: (!) 179/65 (!) 170/51  (!) 152/60  Pulse: 96 88 87 77  Resp: 18 17 16 17   Temp:      TempSrc:      SpO2: 94% 96% 97% 95%  Weight:      Height:       General exam: Alert, awake, oriented x 1; was calm at time of evaluation and following commands.  Patient denies chest pain, no nausea, no vomiting.  Overnight with significant restlessness and delirium. Respiratory system: Good air movement bilaterally; no using accessory muscle.  Good saturation on room air. Cardiovascular system: Rate controlled, no rubs, no gallops, no JVD. Gastrointestinal system: Abdomen is nondistended, soft and nontender. No organomegaly or masses felt. Normal bowel sounds heard. Central nervous system: Alert and oriented. No focal neurological deficits. Extremities: No cyanosis or clubbing.  No lower extremity edema appreciated. Skin: No petechiae. Psychiatry: Judgement and insight appear impaired secondary to ongoing hospital-acquired delirium and underlying dementia.  Latest data Reviewed: CBC: WBCs 6.6, hemoglobin 11.1 platelet count 278 K Comprehensive metabolic panel: Sodium 139, potassium 3.9, chloride 108, bicarb 20, BUN 18, creatinine 0.85, albumin 3.0, AST 72, ALT 247 and GFR > 60 with a creatinine clearance of 39.7  Family Communication: No family at bedside.    Disposition: Status is: Inpatient Remains  inpatient appropriate because: Continue supportive care and await insurance authorization for placement.  Patient overnight in the developing A-fib with RVR and transferred to ICU for rate control management (now improved and not longer requiring IV drips).  Hospital-acquired delirium also appreciated.   Planned Discharge Destination: Skilled nursing facility short term rehab.  Time spent: 50 minutes  Author: Vassie Loll, MD 03/04/2023 11:15 AM  For on call review [email or web address].

## 2023-03-05 DIAGNOSIS — N1832 Chronic kidney disease, stage 3b: Secondary | ICD-10-CM | POA: Diagnosis not present

## 2023-03-05 DIAGNOSIS — Z7189 Other specified counseling: Secondary | ICD-10-CM | POA: Diagnosis not present

## 2023-03-05 DIAGNOSIS — I10 Essential (primary) hypertension: Secondary | ICD-10-CM | POA: Diagnosis not present

## 2023-03-05 DIAGNOSIS — I639 Cerebral infarction, unspecified: Secondary | ICD-10-CM | POA: Diagnosis not present

## 2023-03-05 DIAGNOSIS — R531 Weakness: Principal | ICD-10-CM

## 2023-03-05 DIAGNOSIS — L8990 Pressure ulcer of unspecified site, unspecified stage: Secondary | ICD-10-CM | POA: Insufficient documentation

## 2023-03-05 DIAGNOSIS — Z515 Encounter for palliative care: Secondary | ICD-10-CM | POA: Diagnosis not present

## 2023-03-05 DIAGNOSIS — N179 Acute kidney failure, unspecified: Secondary | ICD-10-CM | POA: Diagnosis not present

## 2023-03-05 MED ORDER — CLONIDINE HCL 0.2 MG PO TABS: 0.2000 mg | ORAL_TABLET | Freq: Three times a day (TID) | ORAL | Status: AC

## 2023-03-05 MED ORDER — HYDRALAZINE HCL 50 MG PO TABS: 50.0000 mg | ORAL_TABLET | Freq: Three times a day (TID) | ORAL | Status: DC

## 2023-03-05 MED ORDER — QUETIAPINE FUMARATE 25 MG PO TABS: 12.5000 mg | ORAL_TABLET | Freq: Two times a day (BID) | ORAL | 0 refills | Status: DC

## 2023-03-05 MED ORDER — METHOCARBAMOL 500 MG PO TABS: 500.0000 mg | ORAL_TABLET | Freq: Three times a day (TID) | ORAL | Status: DC | PRN

## 2023-03-05 MED ORDER — SACCHAROMYCES BOULARDII 250 MG PO CAPS: 250.0000 mg | ORAL_CAPSULE | Freq: Two times a day (BID) | ORAL | Status: AC

## 2023-03-05 MED ORDER — ALPRAZOLAM 0.5 MG PO TABS: 0.5000 mg | ORAL_TABLET | Freq: Two times a day (BID) | ORAL | 0 refills | Status: DC | PRN

## 2023-03-05 MED ORDER — DOCUSATE SODIUM 100 MG PO CAPS
100.0000 mg | ORAL_CAPSULE | Freq: Two times a day (BID) | ORAL | Status: DC | PRN
  Administered 2023-03-05: 100 mg via ORAL
  Filled 2023-03-05: qty 1

## 2023-03-05 MED ORDER — OXYCODONE HCL 5 MG PO TABS: 5.0000 mg | ORAL_TABLET | Freq: Three times a day (TID) | ORAL | 0 refills | Status: DC | PRN

## 2023-03-05 MED ORDER — METOPROLOL TARTRATE 50 MG PO TABS: 50.0000 mg | ORAL_TABLET | Freq: Two times a day (BID) | ORAL | Status: DC

## 2023-03-05 MED ORDER — MUPIROCIN 2 % EX OINT
1.0000 | TOPICAL_OINTMENT | Freq: Two times a day (BID) | CUTANEOUS | Status: DC
  Administered 2023-03-05: 1 via NASAL
  Filled 2023-03-05: qty 22

## 2023-03-05 MED ORDER — FUROSEMIDE 40 MG PO TABS: 40.0000 mg | ORAL_TABLET | Freq: Every day | ORAL | Status: DC | PRN

## 2023-03-05 NOTE — Progress Notes (Signed)
Palliative: [name] is sitting up in the [place] chair in her room.  She greets me, making and mostly keeping eye contact.  She is alert and oriented, able to make her needs known.  She does have memory loss per her family.  There is no family at bedside at this time.  Overall, [name] is very pleasant.  She denies needs or concerns.  We talk about short-term rehab, but she is noncommittal.    Conference with attending, bedside nursing staff, transition of care team related to patient condition, needs, goals of care, disposition.  Plan: Continue to treat the treatable but no CPR or intubation.  Short-term rehab with likely need for long-term care.  Discharging today to [facility] for short-term rehab. DNR/goldenrod form completed and placed on chart.  25 minutes Lillia Carmel, NP Palliative medicine team Team phone [phone] Greater than 50% of this time was spent counseling and coordinating care related to the above assessment and plan.

## 2023-03-05 NOTE — Progress Notes (Signed)
Pt behaving appropriately and not trying to get out of the bed, discussed with MD, tele-sitter order discontinued. Will continue to monitor.

## 2023-03-05 NOTE — TOC Transition Note (Addendum)
Transition of Care [facility]) - CM/SW Discharge Note   Patient Details  Name: [name] DOBRANSKY MRN: [id number] Date of Birth: [birth year]  [facility] CM/SW Contact:  Leitha Bleak, RN Phone Number: 03/05/2023, 12:53 PM   Clinical Narrative:   Patient is medically ready to discharge to [facility], RN called report, DC summary sent. TOC faxed DC summary and transfer report.  Updated Heather at the facility to update that patient will need to continue outpatient palliative at the facility.   Son updated, he will meet her at the facility with clothes and other belongings needed.    Final next level of care: Skilled Nursing Facility Barriers to Discharge: Barriers Resolved   Patient Goals and CMS Choice CMS Medicare.gov Compare Post Acute Care list provided to:: Patient Represenative (must comment) Choice offered to / list presented to : Adult Children  Discharge Placement               Patient to be transferred to facility by: EMS   Patient and family notified of of transfer: 03/05/23  Discharge Plan and Services Additional resources added to the After Visit Summary for        Social Determinants of Health (SDOH) Interventions SDOH Screenings   Food Insecurity: No Food Insecurity (02/28/2023)  Housing: Low Risk  (02/28/2023)  Transportation Needs: No Transportation Needs (02/28/2023)  Utilities: Not At Risk (02/28/2023)  Tobacco Use: Medium Risk (02/28/2023)    Readmission Risk Interventions    09/25/2022    9:57 AM  Readmission Risk Prevention Plan  Post Dischage Appt Complete  Medication Screening Complete  Transportation Screening Complete

## 2023-03-05 NOTE — Progress Notes (Signed)
Report called and given to nursing staff at [facility]. Awaiting EMS for transport.

## 2023-03-05 NOTE — Discharge Summary (Signed)
Physician Discharge Summary   Patient: [name]: [id number] DOB: October 16, 1931  Admit date:     02/28/2023  Discharge date: 03/05/23  Discharge Physician: Vassie Loll   PCP: Vivien Presto, MD   Recommendations at discharge:  Repeat basic metabolic panel in 5 days to follow electrolytes and renal function Reassess blood pressure and adjust antihypertensive treatment as needed In 59-month repeat CT abdomen and pelvis to further assist and renal cyst. Outpatient follow-up with cardiology service recommended given presentation of transient paroxysmal A-fib with RVR during hospitalization.  Discharge Diagnoses: Principal Problem:   Acute on chronic renal failure (HCC) Active Problems:   HTN (hypertension)   Anxiety   OA (osteoarthritis)   Adrenal gland cyst (HCC)   CVA (cerebral vascular accident) (HCC)   Mixed hyperlipidemia   Generalized weakness   Pressure injury of skin  Brief Hospital admission narrative course: [name] is a 89 y.o. female with medical history significant of history of asthma, anxiety, chronic renal failure stage IIIb/IV, hypertension, mixed hyperlipidemia and osteoarthritis; presented to the hospital secondary to generalized weakness and failure to thrive.  Symptoms have been present for the last 4 days or so; when patient was found by EMS living condition was very deplorable and the house was unkept.  Patient oriented x 1-2 intermittently only and not able to contribute much to history. -Per records and reports provided at time of admission decreased oral intake will continue home medications leading to severe dehydration and failure to thrive.  Patient has been complaining of shoulders knees and hip pain.   No chest pain, no nausea, no vomiting, no shortness of breath patient denies dysuria or any particular focal deficits.   Workup in the ED demonstrating negative CT head for acute intracranial abnormality; positive chronic frontal lobe  stroke.  Urinalysis not demonstrating acute infection; chest x-ray without acute cardiopulmonary process.  Basic metabolic panel with positive increase BUN and creatinine and also mild hyperkalemia.  Patient will be admitted due to acute on chronic renal failure along with dehydration and failure to thrive.  Assessment and Plan: * Acute on chronic renal failure (HCC) -Workup not demonstrating signs of acute UTI -Patient's history and presentation suggesting prerenal azotemia, due to dehydration and continue use of nephrotoxic agents -Continue to avoid the use of NSAIDs and any other nephrotoxic agents as much as possible -Diuretics recommended on as-needed basis only. -At this moment renal function has improved and is back to baseline.  Continue to follow trend/stability. -Continue to maintain adequate oral hydration. -No hydronephrosis or bladder outlet obstruction appreciated on CT scan at time of admission.    Mixed hyperlipidemia -continue heart healthy diet and continue tx with zetia.   CVA (cerebral vascular accident) (HCC) -Chronic findings after reviewing CT head without contrast -Continue risk factor modifications -Continue aspirin for secondary prevention. -No focal neurologic deficits appreciated at the moment.   Adrenal gland cyst (HCC) -Incidental findings during CT abdomen and pelvis -Follow radiology recommendations repeat images for his stability in the next 6-12 months has been recommended. -No acute red flags reported.   OA (osteoarthritis) -Involving shoulders, knees and hip -Status post knee replacement and hip surgery -Continue as needed analgesics and follow recommendations by PT services -Patient expresses to be all pending to the idea of rehabilitation at a skilled nursing facility.   Anxiety -Continue the use of BuSpar and as needed Xanax.   HTN (hypertension) -Resume home antihypertensive agents with exception of nephrotoxic medications and  diuretics -Continue  to maintain adequate hydration and continue heart healthy diet.   New onset paroxysmal A-fib with RVR  -Patient auto converted back after using amiodarone on 03/03/2023. -Successfully transitioned off amiodarone and with good rate control while using metoprolol. -Echo demonstrated preserved ejection fraction and no wall motion abnormalities.  Diastolic dysfunction appreciated (currently compensated). -Outpatient follow-up with cardiology service recommended.   Transaminitis: -LFTs improving antistaphylolysin. -Continue to follow trend intermittently. -Avoid the use of hepatotoxic agents.   Hyperkalemia -Resolved; current potassium within normal limits -Continue to follow trend intermittently.    Hospital-acquired delirium/dementia -Seroquel twice a day started with excellent response.  Planning to continue and wean off slowly. -Patient will also continue the use of anxiolytics. -Continue constant reorientation and supportive care. -Follow response -Patient with most likely component of vascular dementia (given prior history of stroke).   Diarrhea/loose stools -Florastor and as needed loperamide were provided with adequate response. -Patient denies nausea, vomiting or abdominal pain. -No further episode of diarrhea currently reported.  Class II obesity -Low-calorie diet and portion control were discussed with patient. -Body mass index is 37.32 kg/m.    Consultants: None Procedures performed: See below for x-ray reports. Disposition: Skilled nursing facility Diet recommendation: Heart healthy/low-sodium diet.  DISCHARGE MEDICATION: Allergies as of 03/05/2023       Reactions   Lisinopril Swelling   angioedema   Hydrochlorothiazide Other (See Comments)   UNSPECIFIED REACTION    Lipitor [atorvastatin Calcium] Other (See Comments)   UNSPECIFIED REACTION    Thiazide-type Diuretics Other (See Comments)   UNSPECIFIED REACTION    Zocor [simvastatin]  Swelling   SWELLING REACTION UNSPECIFIED    Codeine Nausea Only        Medication List     STOP taking these medications    atenolol 50 MG tablet Commonly known as: TENORMIN   carvedilol 25 MG tablet Commonly known as: COREG   naloxone 4 MG/0.1ML Liqd nasal spray kit Commonly known as: NARCAN   tiZANidine 2 MG tablet Commonly known as: ZANAFLEX   zolpidem 5 MG tablet Commonly known as: AMBIEN       TAKE these medications    albuterol 108 (90 Base) MCG/ACT inhaler Commonly known as: VENTOLIN HFA Inhale 2 puffs into the lungs every 6 (six) hours as needed for shortness of breath or wheezing.   ALPRAZolam 0.5 MG tablet Commonly known as: XANAX Take 1 tablet (0.5 mg total) by mouth 2 (two) times daily as needed for anxiety.   ascorbic acid 500 MG tablet Commonly known as: VITAMIN C Take 500 mg by mouth daily.   aspirin EC 81 MG tablet Take 1 tablet (81 mg total) by mouth 2 (two) times daily.   busPIRone 5 MG tablet Commonly known as: BUSPAR Take 5 mg by mouth 2 (two) times daily.   cloNIDine 0.2 MG tablet Commonly known as: CATAPRES Take 1 tablet (0.2 mg total) by mouth 3 (three) times daily. What changed: when to take this   diclofenac Sodium 1 % Gel Commonly known as: VOLTAREN Apply 2 g topically 4 (four) times daily as needed for pain.   ezetimibe 10 MG tablet Commonly known as: ZETIA Take 10 mg by mouth daily.   fluticasone 110 MCG/ACT inhaler Commonly known as: FLOVENT HFA Inhale 1 puff into the lungs daily as needed (asthma).   furosemide 40 MG tablet Commonly known as: LASIX Take 1 tablet (40 mg total) by mouth daily as needed for fluid or edema. What changed:  when to take this reasons to  take this   gabapentin 300 MG capsule Commonly known as: NEURONTIN Take 1 capsule (300 mg total) by mouth 3 (three) times daily. What changed:  when to take this Another medication with the same name was removed. Continue taking this medication, and  follow the directions you see here.   hydrALAZINE 50 MG tablet Commonly known as: APRESOLINE Take 1 tablet (50 mg total) by mouth every 8 (eight) hours. What changed:  medication strength how much to take when to take this   methocarbamol 500 MG tablet Commonly known as: ROBAXIN Take 1 tablet (500 mg total) by mouth every 8 (eight) hours as needed for muscle spasms.   metoprolol tartrate 50 MG tablet Commonly known as: LOPRESSOR Take 1 tablet (50 mg total) by mouth 2 (two) times daily.   Multivitamin Womens 50+ Adv Tabs Take 1 tablet by mouth daily.   oxyCODONE 5 MG immediate release tablet Commonly known as: Roxicodone Take 1 tablet (5 mg total) by mouth every 8 (eight) hours as needed for severe pain. Take 1 tablet by mouth every 4-6 hours as needed for post op pain What changed:  how much to take how to take this when to take this reasons to take this   pantoprazole 40 MG tablet Commonly known as: PROTONIX Take 40 mg by mouth daily.   QUEtiapine 25 MG tablet Commonly known as: SEROQUEL Take 0.5 tablets (12.5 mg total) by mouth 2 (two) times daily.   saccharomyces boulardii 250 MG capsule Commonly known as: FLORASTOR Take 1 capsule (250 mg total) by mouth 2 (two) times daily.   SUPER B COMPLEX PO Take 1 tablet by mouth daily.               Discharge Care Instructions  (From admission, onward)           Start     Ordered   03/05/23 0000  Discharge wound care:       Comments: Constant repositioning; preventative measures recommended.   03/05/23 1234            Contact information for follow-up providers     Corrington, Kip A, MD. Schedule an appointment as soon as possible for a visit in 2 week(s).   Specialty: Family Medicine Why: After discharge from the skilled nursing facility. Contact information: [address] [place] [address] [phone]              Contact information for after-discharge care      Destination     [facility] .   Service: Skilled Nursing Contact information: [address] [place] [address] [phone]                    Discharge Exam: Ceasar Mons Weights   02/28/23 1019 03/03/23 0554 03/04/23 0410  Weight: 80.3 kg 79 kg 81 kg   General exam: Alert, awake, oriented x x 2; no overnight events.  Hemodynamically stable for discharge. Respiratory system: Clear to auscultation. Respiratory effort normal.  Good saturation on room air. Cardiovascular system:RRR. No rubs or gallops. Gastrointestinal system: Abdomen is nondistended, soft and nontender. No organomegaly or masses felt. Normal bowel sounds heard. Central nervous system: Alert and oriented. No focal neurological deficits. Extremities: No cyanosis or clubbing.  No edema appreciated. Skin: No petechiae; stage I pressure injury in her sacrum appreciated.  No signs of superimposed infection. Psychiatry: Judgement and insight appear impaired secondary to underlying dementia; currently oriented x 2 following  commands appropriately and in no acute distress.   Condition at discharge: Stable and improved.  The results of significant diagnostics from this hospitalization (including imaging, microbiology, ancillary and laboratory) are listed below for reference.   Imaging Studies: ECHOCARDIOGRAM COMPLETE  Result Date: 03/03/2023    ECHOCARDIOGRAM REPORT   Patient Name:   GRAZIELLA ALTEMUS Date of Exam: 03/03/2023 Medical Rec #:  [id number]       Height:       58.0 in Accession #:    [id number]      Weight:       174.2 lb Date of Birth:  1932/07/25       BSA:          1.717 m Patient Age:    89 years        BP:           190/76 mmHg Patient Gender: F               HR:           71 bpm. [facility] Procedure: 2D Echo, Cardiac Doppler and Color Doppler Indications:    Atrial Fibrillation I48.91  History:        Patient has no prior history of Echocardiogram examinations.                  Stroke; Risk Factors:Hypertension and Dyslipidemia. CKD (chronic                 kidney disease) stage 2, GFR 60-89 ml/min.  Sonographer:    Celesta Gentile RCS Referring Phys: [id number] ASIA B ZIERLE-GHOSH IMPRESSIONS  1. Left ventricular ejection fraction, by estimation, is 60 to 65%. The left ventricle has normal function. The left ventricle has no regional wall motion abnormalities. Left ventricular diastolic parameters are consistent with Grade II diastolic dysfunction (pseudonormalization). Elevated left atrial pressure.  2. Right ventricular systolic function is normal. The right ventricular size is normal. There is normal pulmonary artery systolic pressure.  3. Left atrial size was severely dilated.  4. The mitral valve is abnormal. Mild mitral valve regurgitation. No evidence of mitral stenosis.  5. The tricuspid valve is abnormal.  6. The aortic valve is tricuspid. There is mild calcification of the aortic valve. There is mild thickening of the aortic valve. Aortic valve regurgitation is not visualized. No aortic stenosis is present.  7. The inferior vena cava is normal in size with greater than 50% respiratory variability, suggesting right atrial pressure of 3 mmHg. FINDINGS  Left Ventricle: Left ventricular ejection fraction, by estimation, is 60 to 65%. The left ventricle has normal function. The left ventricle has no regional wall motion abnormalities. The left ventricular internal cavity size was normal in size. There is  no left ventricular hypertrophy. Left ventricular diastolic parameters are consistent with Grade II diastolic dysfunction (pseudonormalization). Elevated left atrial pressure. Right Ventricle: The right ventricular size is normal. Right vetricular wall thickness was not well visualized. Right ventricular systolic function is normal. There is normal pulmonary artery systolic pressure. The tricuspid regurgitant velocity is 2.35 m/s, and with an assumed right atrial pressure of 3 mmHg,  the estimated right ventricular systolic pressure is 25.1 mmHg. Left Atrium: Left atrial size was severely dilated. Right Atrium: Right atrial size was normal in size. Pericardium: There is no evidence of pericardial effusion. Mitral Valve: The mitral valve is abnormal. There is mild thickening of the mitral valve leaflet(s). There is mild calcification of the mitral  valve leaflet(s). Mild mitral annular calcification. Mild mitral valve regurgitation. No evidence of mitral valve stenosis. Tricuspid Valve: The tricuspid valve is abnormal. Tricuspid valve regurgitation is mild . No evidence of tricuspid stenosis. Aortic Valve: The aortic valve is tricuspid. There is mild calcification of the aortic valve. There is mild thickening of the aortic valve. There is mild aortic valve annular calcification. Aortic valve regurgitation is not visualized. No aortic stenosis  is present. Aortic valve mean gradient measures 3.2 mmHg. Aortic valve peak gradient measures 7.5 mmHg. Aortic valve area, by VTI measures 2.20 cm. Pulmonic Valve: The pulmonic valve was not well visualized. Pulmonic valve regurgitation is not visualized. No evidence of pulmonic stenosis. Aorta: The aortic root is normal in size and structure. Venous: The inferior vena cava is normal in size with greater than 50% respiratory variability, suggesting right atrial pressure of 3 mmHg. IAS/Shunts: No atrial level shunt detected by color flow Doppler.  LEFT VENTRICLE PLAX 2D LVIDd:         5.20 cm   Diastology LVIDs:         3.20 cm   LV e' medial:    6.42 cm/s LV PW:         0.90 cm   LV E/e' medial:  18.1 LV IVS:        1.00 cm   LV e' lateral:   8.05 cm/s LVOT diam:     1.90 cm   LV E/e' lateral: 14.4 LV SV:         68 LV SV Index:   40 LVOT Area:     2.84 cm  RIGHT VENTRICLE RV S prime:     9.57 cm/s TAPSE (M-mode): 2.4 cm LEFT ATRIUM            Index        RIGHT ATRIUM           Index LA diam:      4.30 cm  2.50 cm/m   RA Area:     18.30 cm LA Vol (A2C):  76.6 ml  44.61 ml/m  RA Volume:   51.70 ml  30.11 ml/m LA Vol (A4C): 130.0 ml 75.71 ml/m  AORTIC VALVE AV Area (Vmax):    2.02 cm AV Area (Vmean):   2.30 cm AV Area (VTI):     2.20 cm AV Vmax:           137.05 cm/s AV Vmean:          81.917 cm/s AV VTI:            0.312 m AV Peak Grad:      7.5 mmHg AV Mean Grad:      3.2 mmHg LVOT Vmax:         97.55 cm/s LVOT Vmean:        66.400 cm/s LVOT VTI:          0.242 m LVOT/AV VTI ratio: 0.78  AORTA Ao Root diam: 2.80 cm MITRAL VALVE                TRICUSPID VALVE MV Area (PHT): 7.16 cm     TR Peak grad:   22.1 mmHg MV Decel Time: 106 msec     TR Vmax:        235.00 cm/s MR Peak grad: 112.6 mmHg MR Mean grad: 73.5 mmHg     SHUNTS MR Vmax:      530.50 cm/s   Systemic VTI:  0.24 m MR Vmean:  401.5 cm/s    Systemic Diam: 1.90 cm MV E velocity: 116.00 cm/s MV A velocity: 76.20 cm/s MV E/A ratio:  1.52 Dina Rich MD Electronically signed by Dina Rich MD Signature Date/Time: 03/03/2023/2:40:37 PM    Final    DG Knee Complete 4 Views Left  Result Date: 02/28/2023 CLINICAL DATA:  Weakness. EXAM: LEFT KNEE - COMPLETE 4+ VIEW COMPARISON:  Radiograph 09/21/2022 FINDINGS: Fracture through the inferior patellar pole is chronic, however the fracture fragments demonstrate increased displacement from prior exam. There is no 2 cm of osseous distraction. Patellar [place]. Tibiofemoral arthroplasty is intact without periprosthetic fracture. There is a knee joint effusion. Anterior soft tissue edema. IMPRESSION: 1. Fracture through the inferior patellar pole is chronic, however the fracture fragments demonstrate increased displacement from December exam. Patellar [place]. 2. Intact tibiofemoral arthroplasty. 3. Knee joint effusion and anterior soft tissue edema. Electronically Signed   By: Narda Rutherford M.D.   On: 02/28/2023 14:19   DG Hip Unilat With Pelvis 2-3 Views Left  Result Date: 02/28/2023 CLINICAL DATA:  Weakness. EXAM: DG HIP (WITH OR WITHOUT PELVIS) 2-3V LEFT  COMPARISON:  None Available. FINDINGS: Left hip arthroplasty. Femoral stem is slightly laterally oriented. No periprosthetic fracture or lucency. Intact pubic rami and bony pelvis. Right hip arthroplasty with heterotopic calcification adjacent to the greater trochanter. Advanced degenerative change of the pubic symphysis and sacroiliac joints. IMPRESSION: Left hip arthroplasty with slight lateral orientation of the femoral stem. No periprosthetic fracture or acute findings. Electronically Signed   By: Narda Rutherford M.D.   On: 02/28/2023 14:17   DG Chest Port 1 View  Result Date: 02/28/2023 CLINICAL DATA:  Weakness. EXAM: PORTABLE CHEST 1 VIEW COMPARISON:  Chest radiograph 03/29/2022 FINDINGS: Heart size upper normal. Normal mediastinal contours. No focal airspace disease, pulmonary edema, large pleural effusion or pneumothorax. IMPRESSION: Borderline cardiomegaly without acute pulmonary process. Electronically Signed   By: Narda Rutherford M.D.   On: 02/28/2023 14:15   CT ABDOMEN PELVIS WO CONTRAST  Result Date: 02/28/2023 CLINICAL DATA:  Acute generalized abdominal pain. Elevated liver function tests. EXAM: CT ABDOMEN AND PELVIS WITHOUT CONTRAST TECHNIQUE: Multidetector CT imaging of the abdomen and pelvis was performed following the standard protocol without IV contrast. RADIATION DOSE REDUCTION: This exam was performed according to the departmental dose-optimization program which includes automated exposure control, adjustment of the mA and/or kV according to patient size and/or use of iterative reconstruction technique. COMPARISON:  None Available. FINDINGS: Lower chest: No acute abnormality. Hepatobiliary: Status post cholecystectomy. Mild intrahepatic and extrahepatic biliary dilatation is noted most likely due to post cholecystectomy status. Liver is otherwise unremarkable on these unenhanced images. Pancreas: Fatty replacement of the pancreas is noted. Spleen: Normal in size without focal  abnormality. Adrenals/Urinary Tract: Adrenal glands appear normal. Exophytic cysts are seen involving the left kidney for which no further follow-up is required. No hydronephrosis or renal obstruction is noted. Urinary bladder is not well visualized due to scatter artifact arising from bilateral hip prostheses. Stomach/Bowel: The stomach is unremarkable. There is no evidence of bowel obstruction or inflammation. Vascular/Lymphatic: Aortic atherosclerosis. No enlarged abdominal or pelvic lymph nodes. Reproductive: Uterus and left adnexa are unremarkable. 6.1 cm right adnexal cyst is noted. Other: No abdominal wall hernia or abnormality. No abdominopelvic ascites. Musculoskeletal: Status post bilateral total hip arthroplasties. No acute osseous abnormality is noted. IMPRESSION: 6.1 cm right adnexal cyst. Recommend follow-up US in 6-12 months. Note: This recommendation does not apply to premenarchal patients and to those with increased risk (  genetic, family history, elevated tumor markers or other high-risk factors) of ovarian cancer. Reference: JACR 2020 Feb; 17(2):248-254. No acute abnormality seen in the abdomen or pelvis. Aortic Atherosclerosis (ICD10-I70.0). Electronically Signed   By: Lupita Raider M.D.   On: 02/28/2023 14:15   DG Shoulder Left  Result Date: 02/28/2023 CLINICAL DATA:  Weakness. EXAM: LEFT SHOULDER - 2+ VIEW COMPARISON:  None Available. FINDINGS: Glenohumeral degenerative change with prominent subchondral cysts in the humeral head. Moderate acromioclavicular degenerative change with inferior osteophytes. There is a subacromial spur. Subcortical cystic change in the lateral humeral head. No fracture or acute osseous abnormalities. No soft tissue calcifications. IMPRESSION: 1. Glenohumeral and acromioclavicular degenerative change. Subacromial spur. 2. Subcortical cystic change in the lateral humeral head, suggesting underlying rotator cuff pathology. Electronically Signed   By: Narda Rutherford M.D.   On: 02/28/2023 14:14   CT Head Wo Contrast  Result Date: 02/28/2023 CLINICAL DATA:  Mental status change, unknown cause EXAM: CT HEAD WITHOUT CONTRAST TECHNIQUE: Contiguous axial images were obtained from the base of the skull through the vertex without intravenous contrast. RADIATION DOSE REDUCTION: This exam was performed according to the departmental dose-optimization program which includes automated exposure control, adjustment of the mA and/or kV according to patient size and/or use of iterative reconstruction technique. COMPARISON:  CT Head 09/26/17 FINDINGS: Brain: No hemorrhage. No extra-axial fluid collection. No hydrocephalus. Compared to prior exam there is a new infarct in the right frontal lobe/frontal opercular region. This is likely chronic. No extra-axial fluid collection. Vascular: No hyperdense vessel or unexpected calcification. Skull: Normal. Negative for fracture or focal lesion. Sinuses/Orbits: No middle ear or mastoid effusion. Paranasal sinuses are clear. Bilateral lens replacement. Orbits are otherwise unremarkable. Other: None. 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 This is chronic appearing, but technically age indeterminate. If there is clinical concern, further evaluation with a MRI could be considered. Electronically Signed   By: Lorenza Cambridge M.D.   On: 02/28/2023 13:56    Microbiology: Results for orders placed or performed during the hospital encounter of 02/28/23  SARS Coronavirus 2 by RT PCR (hospital order, performed in [facility] hospital lab) *cepheid single result test* Anterior Nasal Swab     Status: None   Collection Time: 02/28/23 10:40 AM   Specimen: Anterior Nasal Swab  Result Value Ref Range Status   SARS Coronavirus 2 by RT PCR NEGATIVE NEGATIVE Final    Comment: (NOTE) SARS-CoV-2 target nucleic acids are NOT DETECTED.  The SARS-CoV-2 RNA is generally detectable in upper  and lower respiratory specimens during the acute phase of infection. The lowest concentration of SARS-CoV-2 viral copies this assay can detect is 250 copies / mL. A negative result does not preclude SARS-CoV-2 infection and should not be used as the sole basis for treatment or other patient management decisions.  A negative result may occur with improper specimen collection / handling, submission of specimen other than nasopharyngeal swab, presence of viral mutation(s) within the areas targeted by this assay, and inadequate number of viral copies (<250 copies / mL). A negative result must be combined with clinical observations, patient history, and epidemiological information.  Fact Sheet for Patients:   RoadLapTop.co.za  Fact Sheet for Healthcare Providers: http://kim-miller.com/  This test is not yet approved or  cleared by the [place] FDA and has been authorized for detection and/or diagnosis of SARS-CoV-2 by FDA under an Emergency Use Authorization (EUA).  This EUA  will remain in effect (meaning this test can be used) for the duration of the COVID-19 declaration under Section 564(b)(1) of the Act, 21 U.S.C. section 360bbb-3(b)(1), unless the authorization is terminated or revoked sooner.  Performed at [facility], [address], [place], [place] [address]   MRSA Next Gen by PCR, Nasal     Status: Abnormal   Collection Time: 02/28/23  4:08 PM   Specimen: Nasal Mucosa; Nasal Swab  Result Value Ref Range Status   MRSA by PCR Next Gen DETECTED (A) NOT DETECTED Final    Comment: CRITICAL RESULT CALLED TO, READ BACK BY AND VERIFIED WITH: CATES LAND, MICHELLE @ 1749 ON 02/28/2023 BY FRATTO,ASHLEY        The GeneXpert MRSA Assay (FDA approved for NASAL specimens only), is one component of a comprehensive MRSA colonization surveillance program. It is not intended to diagnose MRSA infection nor to guide or monitor treatment  for MRSA infections. Performed at [facility], [address], [place], [place] [address]     Labs: CBC: Recent Labs  Lab 02/28/23 1036 03/01/23 0414 03/03/23 0444  WBC 11.2* 7.0 6.6  NEUTROABS 9.6*  --   --   HGB 10.9* 9.2* 11.1*  HCT 34.8* 30.1* 34.6*  MCV 94.6 95.6 93.0  PLT 233 211 278   Basic Metabolic Panel: Recent Labs  Lab 02/28/23 1036 02/28/23 1837 03/01/23 0414 03/02/23 0332 03/03/23 0444  NA 136  --  139 139 139  K 5.2*  --  4.4 4.3 3.9  CL 104  --  110 111 108  CO2 20*  --  20* 21* 20*  GLUCOSE 140*  --  107* 110* 149*  BUN 92*  --  69* 35* 18  CREATININE 3.62*  --  1.83* 0.94 0.85  CALCIUM 8.6*  --  8.1* 8.4* 8.5*  MG  --  2.9*  --   --  2.0  PHOS  --  4.8*  --   --   --    Liver Function Tests: Recent Labs  Lab 02/28/23 1036 03/01/23 0414 03/03/23 0444  AST 240* 197* 72*  ALT 372* 322* 247*  ALKPHOS 67 54 57  BILITOT 0.6 0.6 0.8  PROT 7.1 5.7* 6.5  ALBUMIN 3.3* 2.6* 3.0*   CBG: Recent Labs  Lab 02/28/23 1007  GLUCAP 137*    Discharge time spent: greater than 30 minutes.  Signed: Vassie Loll, MD [facility] 03/05/2023

## 2023-03-05 NOTE — Care Management Important Message (Signed)
Important Message  Patient Details  Name: [name] MRN: [id number] Date of Birth: June 13, 1932   Medicare Important Message Given:  Yes (spoke with son Thereasa Distance at [phone] to explain letter)     Corey Harold 03/05/2023, 12:51 PM

## 2023-04-19 ENCOUNTER — Ambulatory Visit: Payer: Medicare Other | Admitting: Internal Medicine

## 2023-06-18 ENCOUNTER — Ambulatory Visit: Payer: Medicare Other | Admitting: Internal Medicine

## 2023-07-15 ENCOUNTER — Encounter: Payer: Self-pay | Admitting: Neurology

## 2023-07-15 ENCOUNTER — Ambulatory Visit (INDEPENDENT_AMBULATORY_CARE_PROVIDER_SITE_OTHER): Payer: Medicare Other | Admitting: Neurology

## 2023-07-15 VITALS — BP 187/77 | HR 79 | Ht 63.6 in

## 2023-07-15 DIAGNOSIS — G3184 Mild cognitive impairment, so stated: Secondary | ICD-10-CM

## 2023-07-15 DIAGNOSIS — R251 Tremor, unspecified: Secondary | ICD-10-CM

## 2023-07-15 DIAGNOSIS — F03A3 Unspecified dementia, mild, with mood disturbance: Secondary | ICD-10-CM

## 2023-07-15 NOTE — Progress Notes (Signed)
[facility]  PATIENT: [name] DOB: November 26, 1931  REQUESTING CLINICIAN: Dennard Nip, NP HISTORY FROM: Patient and chart review  REASON FOR VISIT: Memory loss    HISTORICAL  CHIEF COMPLAINT:  Chief Complaint  Patient presents with   New Patient (Initial Visit)    Rm 12. Accompanied by Greggory Keen. NP/[place]/Shenika Lequita Halt NP/dementia, cognitive impairment.    HISTORY OF PRESENT ILLNESS:  This 89 year old woman with multiple medical conditions including hypertension, hyperlipidemia, previous stroke, depression and anxiety, and memory loss who is presenting for evaluation of the memory loss.  Patient is accompanied by transport who does not work one-to-one with patient, so history is limited.  Patient tells me prior to going to the hospital she was independent, living alone, her son lives next door and checks on her all the time.  She was independent in all activities daily living, she was driving, handling her bills and taking care of herself.  She reports in June she had an argument with her son and does not remember much until the next day when she was in the hospital.  Per chart review, it was noted that patient had suicidal ideations.  After discharge from the hospital she was sent to [facility].  At the facility, she does PT and OT, denies any fall but would like to go home. History is limited.   TBI:   No past history of TBI Stroke: Previous right frontal stroke  no past history of stroke Seizures:   no past history of seizures Sleep:   no history of sleep apnea.  Mood: Yes, both anxiety and depression Family history of Dementia:  Denies  Functional status:  Patient lives with at [facility]. Cooking: no Cleaning: no Shopping: no Bathing: no issues Toileting: no issues Driving: was driving prior to being admitted to the hospital Bills: was handling bill prior to admission but now son is handling the bills   Medications: no issues Ever left the stove on by accident?: denies Forget how to use items around the house?: denies Getting lost going to familiar places?: denies Forgetting loved ones names?: denies Word finding difficulty? yes Sleep: no issues    OTHER MEDICAL CONDITIONS: Hypertension, hyperlipidemia, Memory loss, Anxiety an Depression, previous strokes   REVIEW OF SYSTEMS: Full 14 system review of systems performed and negative with exception of: as noted in the HPI  ALLERGIES: Allergies  Allergen Reactions   Lisinopril Swelling    angioedema   Hydrochlorothiazide Other (See Comments)    UNSPECIFIED REACTION    Lipitor [Atorvastatin Calcium] Other (See Comments)    UNSPECIFIED REACTION    Thiazide-Type Diuretics Other (See Comments)    UNSPECIFIED REACTION    Zocor [Simvastatin] Swelling    SWELLING REACTION UNSPECIFIED    Codeine Nausea Only    HOME MEDICATIONS: Outpatient Medications Prior to Visit  Medication Sig Dispense Refill   acetaminophen (TYLENOL) 325 MG tablet Take 650 mg by mouth every 6 (six) hours as needed.     ALPRAZolam (XANAX) 0.5 MG tablet Take 1 tablet (0.5 mg total) by mouth 2 (two) times daily as needed for anxiety. 15 tablet 0   amLODipine (NORVASC) 10 MG tablet Take 10 mg by mouth daily.     apixaban (ELIQUIS) 5 MG TABS tablet Take 5 mg by mouth 2 (two) times daily.     B Complex-C (SUPER B COMPLEX PO) Take 1 tablet by mouth daily.     busPIRone (BUSPAR) 5 MG tablet Take  5 mg by mouth 2 (two) times daily.     Cholecalciferol 50 MCG (2000 UT) CAPS Take by mouth.     cloNIDine (CATAPRES) 0.2 MG tablet Take 1 tablet (0.2 mg total) by mouth 3 (three) times daily.     donepezil (ARICEPT) 5 MG tablet Take 5 mg by mouth at bedtime.     ezetimibe (ZETIA) 10 MG tablet Take 10 mg by mouth daily.     gabapentin (NEURONTIN) 300 MG capsule Take 1 capsule (300 mg total) by mouth 3 (three) times daily. (Patient taking differently: Take 300 mg by mouth daily.)  90 capsule 1   hydrALAZINE (APRESOLINE) 50 MG tablet Take 1 tablet (50 mg total) by mouth every 8 (eight) hours.     losartan (COZAAR) 50 MG tablet Take 50 mg by mouth daily.     metoprolol tartrate (LOPRESSOR) 50 MG tablet Take 1 tablet (50 mg total) by mouth 2 (two) times daily.     Multiple Vitamins-Minerals (MULTIVITAMIN WOMENS 50+ ADV) TABS Take 1 tablet by mouth daily.     oxyCODONE (ROXICODONE) 5 MG immediate release tablet Take 1 tablet (5 mg total) by mouth every 8 (eight) hours as needed for severe pain. Take 1 tablet by mouth every 4-6 hours as needed for post op pain 20 tablet 0   QUEtiapine (SEROQUEL) 25 MG tablet Take 0.5 tablets (12.5 mg total) by mouth 2 (two) times daily. 30 tablet 0   saccharomyces boulardii (FLORASTOR) 250 MG capsule Take 250 mg by mouth 2 (two) times daily.     SERTRALINE HCL PO Take 75 mg by mouth daily.     albuterol (PROVENTIL HFA;VENTOLIN HFA) 108 (90 BASE) MCG/ACT inhaler Inhale 2 puffs into the lungs every 6 (six) hours as needed for shortness of breath or wheezing.     aspirin EC 81 MG tablet Take 1 tablet (81 mg total) by mouth 2 (two) times daily. 60 tablet 0   diclofenac Sodium (VOLTAREN) 1 % GEL Apply 2 g topically 4 (four) times daily as needed for pain.     fluticasone (FLOVENT HFA) 110 MCG/ACT inhaler Inhale 1 puff into the lungs daily as needed (asthma).     furosemide (LASIX) 40 MG tablet Take 1 tablet (40 mg total) by mouth daily as needed for fluid or edema.     methocarbamol (ROBAXIN) 500 MG tablet Take 1 tablet (500 mg total) by mouth every 8 (eight) hours as needed for muscle spasms.     pantoprazole (PROTONIX) 40 MG tablet Take 40 mg by mouth daily.     vitamin C (ASCORBIC ACID) 500 MG tablet Take 500 mg by mouth daily.     No facility-administered medications prior to visit.    PAST MEDICAL HISTORY: Past Medical History:  Diagnosis Date   Anxiety    Arthritis    Asthma    Cancer (HCC)    SKIN CA OF FACE   Chronic kidney disease     Chronic pain    COVID    moderate case - had pneumonia   Depression    Diabetes mellitus without complication (HCC)    Gallstones    GERD (gastroesophageal reflux disease)    Hyperlipidemia    Hypertension    Insomnia    Iron deficiency anemia    Peri-prosthetic patellar fracture 09/19/2022   Pneumonia    Prosthetic joint infection (HCC)    RBBB 03/29/2022   TIA (transient ischemic attack)     PAST SURGICAL HISTORY: Past Surgical History:  Procedure Laterality Date    ARM FRACTURE     LEFT  and right   CHOLECYSTECTOMY     DILATION AND CURETTAGE OF UTERUS     IRRIGATION AND DEBRIDEMENT KNEE  09/12/2011   Procedure: IRRIGATION AND DEBRIDEMENT KNEE;  Surgeon: Nestor Lewandowsky;  Location: [facility] OR;  Service: Orthopedics;  Laterality: Right;  I&D RIGHT TKA REVISE BEARINGS/MBT. Poly exchange   PATELLAR TENDON REPAIR Left 09/21/2022   Procedure: LEFT PATELLAR TENDON REPAIR;  Surgeon: Gean Birchwood, MD;  Location: [facility] OR;  Service: Orthopedics;  Laterality: Left;   REPLACEMENT TOTAL KNEE     right   TOTAL HIP ARTHROPLASTY Left    TOTAL HIP ARTHROPLASTY Right 01/20/2018   Procedure: TOTAL HIP ARTHROPLASTY WITH REMOVAL OF INTRAMEDULLARY NAIL;  Surgeon: Gean Birchwood, MD;  Location: [facility] OR;  Service: Orthopedics;  Laterality: Right;   TOTAL KNEE ARTHROPLASTY Left 09/03/2022   Procedure: LEFT TOTAL KNEE ARTHROPLASTY;  Surgeon: Gean Birchwood, MD;  Location: [facility] ORS;  Service: Orthopedics;  Laterality: Left;   TOTAL SHOULDER ARTHROPLASTY Right 10/02/2018   Procedure: REVERSE TOTAL SHOULDER;  Surgeon: Jones Broom, MD;  Location: [facility] OR;  Service: Orthopedics;  Laterality: Right;    FAMILY HISTORY: Family History  Problem Relation Age of Onset   Diabetes Mellitus II Daughter     SOCIAL HISTORY: Social History   Socioeconomic History   Marital status: Widowed    Spouse name: Not on file   Number of children: Not on file   Years of education: Not on file   Highest education level: Not  on file  Occupational History   Not on file  Tobacco Use   Smoking status: Former    Current packs/day: 0.00    Average packs/day: 0.3 packs/day for 10.0 years (2.5 ttl pk-yrs)    Types: Cigarettes    Start date: 60    Quit date: 82    Years since quitting: 44.8   Smokeless tobacco: Never   Tobacco comments:    Quit 50 years ago  Vaping Use   Vaping status: Never Used  Substance and Sexual Activity   Alcohol use: No   Drug use: No   Sexual activity: Not Currently  Other Topics Concern   Not on file  Social History Narrative   Not on file   Social Determinants of Health   Financial Resource Strain: Low Risk  (03/28/2023)   Received from [place] [facility] [place], [facility] [employer] [facility]   Overall [employer] (CARDIA)    Difficulty of Paying Living Expenses: Not hard at all  Food Insecurity: No Food Insecurity (03/28/2023)   Received from [place] [facility] [place], [facility] [employer] [facility] Vital Sign    Worried About Programme researcher, broadcasting/film/video in the Last Year: Never true    Ran Out of Food in the Last Year: Never true  Transportation Needs: No Transportation Needs (03/28/2023)   Received from [place] [facility] [place], [facility] [employer] - Administrator, Civil Service (Medical): No    Lack of Transportation (Non-Medical): No  Physical Activity: Insufficiently Active (02/05/2023)   Received from [facility], [facility]   Exercise Vital Sign    Days of Exercise per Week: 2 days    Minutes of Exercise per Session: 10 min  Stress: Stress Concern Present (02/05/2023)   Received from [facility], [facility] of Occupational Health - Occupational Stress Questionnaire  Feeling of Stress : Very much  Social Connections: Moderately Integrated (02/05/2023)   Received from [facility], [facility]   Social Network    How would you rate your social network (family, work, friends)?:  Adequate participation with social networks  Intimate Partner Violence: Not At Risk (03/28/2023)   Received from [place] [facility] [place], [facility], Afraid, Rape, and Kick questionnaire    Fear of Current or Ex-Partner: No    Emotionally Abused: No    Physically Abused: No    Sexually Abused: No    PHYSICAL EXAM  GENERAL EXAM/CONSTITUTIONAL: Vitals:  Vitals:   07/15/23 0916 07/15/23 0929  BP: (!) 203/64 (!) 187/77  Pulse: 84 79  Height: 5' 3.6" (1.615 m)    Body mass index is 31.04 kg/m. Wt Readings from Last 3 Encounters:  03/04/23 178 lb 9.2 oz (81 kg)  09/21/22 176 lb 5.9 oz (80 kg)  09/03/22 175 lb (79.4 kg)   Patient is in no distress; well developed, nourished and groomed; neck is supple  MUSCULOSKELETAL: Gait, strength, tone, movements noted in Neurologic exam below  NEUROLOGIC: MENTAL STATUS:      No data to display            07/15/2023    9:17 AM  Montreal Cognitive Assessment   Visuospatial/ Executive (0/5) 3  Naming (0/3) 3  Attention: Read list of digits (0/2) 0  Attention: Read list of letters (0/1) 1  Attention: Serial 7 subtraction starting at 100 (0/3) 1  Language: Repeat phrase (0/2) 2  Language : Fluency (0/1) 0  Abstraction (0/2) 0  Delayed Recall (0/5) 2  Orientation (0/6) 5  Total 17  Adjusted Score (based on education) 18    CRANIAL NERVE:  2nd, 3rd, 4th, 6th- visual fields full to confrontation, extraocular muscles intact, no nystagmus 5th - facial sensation symmetric 7th - facial strength symmetric 8th - hearing intact 9th - palate elevates symmetrically, uvula midline 11th - shoulder shrug symmetric 12th - tongue protrusion midline  MOTOR:  normal bulk and tone, at least antigravity in the BUE, BLE but limited by pain  SENSORY:  normal and symmetric to light touch  COORDINATION:  finger-nose-finger, There is presence of resting and action tremors   GAIT/STATION:  Deferred       DIAGNOSTIC DATA (LABS, IMAGING, TESTING) - I reviewed patient records, labs, notes, testing and imaging myself where available.  Lab Results  Component Value Date   WBC 6.6 03/03/2023   HGB 11.1 (L) 03/03/2023   HCT 34.6 (L) 03/03/2023   MCV 93.0 03/03/2023   PLT 278 03/03/2023      Component Value Date/Time   NA 139 03/03/2023 0444   K 3.9 03/03/2023 0444   CL 108 03/03/2023 0444   CO2 20 (L) 03/03/2023 0444   GLUCOSE 149 (H) 03/03/2023 0444   BUN 18 03/03/2023 0444   CREATININE 0.85 03/03/2023 0444   CREATININE 1.64 (H) 08/24/2015 1139   CALCIUM 8.5 (L) 03/03/2023 0444   PROT 6.5 03/03/2023 0444   ALBUMIN 3.0 (L) 03/03/2023 0444   AST 72 (H) 03/03/2023 0444   ALT 247 (H) 03/03/2023 0444   ALKPHOS 57 03/03/2023 0444   BILITOT 0.8 03/03/2023 0444   GFRNONAA >60 03/03/2023 0444   GFRNONAA 29 (L) 08/24/2015 1139   GFRAA 32 (L) 07/08/2019 1224   GFRAA 34 (L) 08/24/2015 1139   No results found for: "CHOL", "HDL", "LDLCALC", "LDLDIRECT", "TRIG", "CHOLHDL" Lab Results  Component  Value Date   HGBA1C 6.0 (H) 08/31/2022   No results found for: "VITAMINB12" Lab Results  Component Value Date   TSH 0.720 02/28/2023    CT Head 02/28/2023 Compared to prior exam there is a new infarct in the right frontal lobe/frontal opercular region. This is chronic appearing, but technically age indeterminate. If there is clinical concern, further evaluation with a MRI could be considered  MRI Brain 03/25/2023 1.  No acute infarct or intracranial hemorrhage.  2.  Remote infarct of the right frontal lobe.  3.  Generalized volume loss without a specific neurodegenerative pattern.     ASSESSMENT AND PLAN  89 y.o. year old female with hypertension, hyperlipidemia, previous stroke, depression and anxiety, and memory loss who is presenting for evaluation of the memory loss.  She is alone today, therefore history is limited.  On exam she scored a 18 out of 30 on the MoCA indicative of some  impairment.  Patient likely has mild cognitive impairment versus mild dementia with mood disturbance.  Plan will be to obtain ATN profile to look for Alzheimer disease biomarker and B12 level.  She is already on Aricept, advised her to continue all of her medications.  I will contact them to go over the result, otherwise they can continue to follow with PCP.   1. Mild dementia with mood disturbance, unspecified dementia type (HCC)   2. Mild cognitive impairment   3. Tremor      Patient Instructions  Continue current medications Will check ATN profile to look for Alzheimer disease biomarker and B12 level Continue follow-up with PCP Return as needed.  Orders Placed This Encounter  Procedures   ATN PROFILE   Vitamin B12    No orders of the defined types were placed in this encounter.   No follow-ups on file.  I have spent a total of 65 minutes dedicated to this patient today, preparing to see patient, performing a medically appropriate examination and evaluation, ordering tests and/or medications and procedures, and counseling and educating the patient/family/caregiver; independently interpreting result and communicating results to the family/patient/caregiver; and documenting clinical information in the electronic medical record.   Windell Norfolk, MD 07/15/2023, 10:10 AM  [facility] [address], [address] [place], [place] [address] [phone]

## 2023-07-15 NOTE — Patient Instructions (Signed)
Continue current medications Will check ATN profile to look for Alzheimer disease biomarker and B12 level Continue follow-up with PCP Return as needed.

## 2023-07-18 ENCOUNTER — Telehealth: Payer: Self-pay

## 2023-07-18 LAB — ATN PROFILE
A -- Beta-amyloid 42/40 Ratio: 0.097 — ABNORMAL LOW (ref >0.102)
Beta-amyloid 40: 288.22 pg/mL
Beta-amyloid 42: 27.95 pg/mL
N -- NfL, Plasma: 6.92 pg/mL (ref 0.00–11.55)
T -- p-tau181: 1.18 pg/mL — ABNORMAL HIGH (ref 0.00–0.97)

## 2023-07-18 LAB — VITAMIN B12: Vitamin B-12: 774 pg/mL (ref 232–1245)

## 2023-07-18 NOTE — Progress Notes (Signed)
Please call and advise the patient/son that the recent labs showed evidence of Alzheimer disease biomarkers. She is already on Aricept, recommendation is to continue Aricept. No further action is required on these tests at this time. Please remind patient to keep any upcoming appointments or tests and to call us with any interim questions, concerns, problems or updates. Thanks,   Windell Norfolk, MD

## 2023-07-18 NOTE — Telephone Encounter (Signed)
-----   Message from [facility] sent at 07/18/2023  1:30 PM EDT ----- Please call and advise the patient/son that the recent labs showed evidence of Alzheimer disease biomarkers. She is already on Aricept, recommendation is to continue Aricept. No further action is required on these tests at this time. Please remind patient to keep any upcoming appointments or tests and to call us with any interim questions, concerns, problems or updates. Thanks,   Windell Norfolk, MD

## 2023-07-18 NOTE — Telephone Encounter (Signed)
Unable to leave msg.

## 2023-07-22 NOTE — Telephone Encounter (Signed)
DIL Renee Sorrell called and LVM stating she has missed a call and was calling back. Please advise.

## 2023-07-22 NOTE — Telephone Encounter (Signed)
Called and spoke to pts son and relayed result he voiced gratitude and understanding

## 2023-07-22 NOTE — Telephone Encounter (Signed)
Left msg to return call

## 2023-08-06 NOTE — Progress Notes (Unsigned)
Cardiology Office Note:  .   Date:  08/07/2023  ID:  SHANTANA GRATTON, DOB 1931-12-02, MRN [id number] PCP: Vivien Presto, MD  [facility] Providers Cardiologist:  None    History of Present Illness: .   [name] is a 89 y.o. female with hx of CVA, HTN, OA, CKD stage III b, referral for HLD and HTN as well as transient Afib. She was in the hospital in June. She had signs of FTT. She had Afib with RBBB. No progressive SOB, she has asthma.  Her right leg is always more swollen than the left. She denies palpitations.  Otherwise she is here today with a caregiver.  Echo on 03/03/2023 shows normal LV function, Grade IIDD, elevated LVEDP, severely enlarged LA, mild MR, RV fxn is normal  Current Outpatient Medications on File Prior to Visit  Medication Sig Dispense Refill   acetaminophen (TYLENOL) 325 MG tablet Take 650 mg by mouth every 6 (six) hours as needed.     ALPRAZolam (XANAX) 0.5 MG tablet Take 1 tablet (0.5 mg total) by mouth 2 (two) times daily as needed for anxiety. 15 tablet 0   amLODipine (NORVASC) 10 MG tablet Take 10 mg by mouth daily.     apixaban (ELIQUIS) 5 MG TABS tablet Take 5 mg by mouth 2 (two) times daily.     B Complex-C (SUPER B COMPLEX PO) Take 1 tablet by mouth daily.     busPIRone (BUSPAR) 5 MG tablet Take 5 mg by mouth 2 (two) times daily.     Cholecalciferol 50 MCG (2000 UT) CAPS Take by mouth.     cloNIDine (CATAPRES) 0.2 MG tablet Take 1 tablet (0.2 mg total) by mouth 3 (three) times daily.     donepezil (ARICEPT) 5 MG tablet Take 5 mg by mouth at bedtime.     ezetimibe (ZETIA) 10 MG tablet Take 10 mg by mouth daily.     gabapentin (NEURONTIN) 300 MG capsule Take 1 capsule (300 mg total) by mouth 3 (three) times daily. (Patient taking differently: Take 300 mg by mouth daily.) 90 capsule 1   hydrALAZINE (APRESOLINE) 50 MG tablet Take 1 tablet (50 mg total) by mouth every 8 (eight) hours.     losartan (COZAAR) 50 MG tablet Take 50 mg by mouth daily.      metoprolol tartrate (LOPRESSOR) 50 MG tablet Take 1 tablet (50 mg total) by mouth 2 (two) times daily.     Multiple Vitamins-Minerals (MULTIVITAMIN WOMENS 50+ ADV) TABS Take 1 tablet by mouth daily.     oxyCODONE (ROXICODONE) 5 MG immediate release tablet Take 1 tablet (5 mg total) by mouth every 8 (eight) hours as needed for severe pain. Take 1 tablet by mouth every 4-6 hours as needed for post op pain 20 tablet 0   QUEtiapine (SEROQUEL) 25 MG tablet Take 0.5 tablets (12.5 mg total) by mouth 2 (two) times daily. 30 tablet 0   saccharomyces boulardii (FLORASTOR) 250 MG capsule Take 250 mg by mouth 2 (two) times daily.     SERTRALINE HCL PO Take 75 mg by mouth daily.     No current facility-administered medications on file prior to visit.     ROS:  per HPI otherwise negative   Studies Reviewed: Marland Kitchen        EKG Interpretation Date/Time:  Wednesday August 07 2023 10:14:54 EST Ventricular Rate:  58 PR Interval:    QRS Duration:  146 QT Interval:  414 QTC Calculation: 406 R Axis:  41  Text Interpretation: Atrial fibrillation with slow ventricular response Right bundle Rianne Degraaf block When compared with ECG of 03-Mar-2023 04:47, Vent. rate has decreased BY  87 BPM QRS axis Shifted left Borderline criteria for Inferior infarct are no longer Present Confirmed by Carolan Clines ([id number]) on 08/07/2023 10:17:46 AM  Risk Assessment/Calculations:    CHA2DS2-VASc Score = 6   This indicates a 9.7% annual risk of stroke. The patient's score is based upon: CHF History: 0 HTN History: 1 Diabetes History: 0 Stroke History: 2 Vascular Disease History: 0 Age Score: 2 Gender Score: 1         Physical Exam:   VS:   Vitals:   08/07/23 1014  BP: (!) 108/44  Pulse: (!) 58  SpO2: 96%     Wt Readings from Last 3 Encounters:  03/04/23 178 lb 9.2 oz (81 kg)  09/21/22 176 lb 5.9 oz (80 kg)  09/03/22 175 lb (79.4 kg)    GEN: Well nourished, well developed in no acute distress.  Sitting in a  wheelchair NECK: No JVD CARDIAC: IRRR, no murmurs, rubs, gallops RESPIRATORY:  Clear to auscultation without rales, wheezing or rhonchi  ABDOMEN: Soft, non-tender, non-distended EXTREMITIES:  No edema; No deformity   ASSESSMENT AND PLAN: .   PAF -She remains in atrial fibrillation, she is asymptomatic.  Considering her age and hospitalization with signs of FTT we will plan to rate control for now; she is in agreement with this plan. - continue eliquis 5 mg BID -Was briefly managed with amiodarone , now rate controlled on beta-blocker  HTN -Well-controlled -Continue antihypertensives above  HFPeF CKD stage IIIb - in the setting of CKD -She is euvolemic today.  We discussed that if she were to develop worsening lower extremity edema or worsening dyspnea on exertion/PND to let us know  HLD - she has FTT and 89; no real benefit of managing lipids  Dispo: Follow-up in 6 months  Signed, Vanetta Rule, Alben Spittle, MD

## 2023-08-07 ENCOUNTER — Ambulatory Visit: Payer: Medicare Other | Attending: Internal Medicine | Admitting: Internal Medicine

## 2023-08-07 ENCOUNTER — Encounter: Payer: Self-pay | Admitting: Internal Medicine

## 2023-08-07 VITALS — BP 108/44 | HR 58 | Ht 63.5 in | Wt 194.4 lb

## 2023-08-07 DIAGNOSIS — I10 Essential (primary) hypertension: Secondary | ICD-10-CM

## 2023-08-07 DIAGNOSIS — E782 Mixed hyperlipidemia: Secondary | ICD-10-CM | POA: Diagnosis not present

## 2023-08-07 NOTE — Patient Instructions (Addendum)
Medication Instructions:  No changes    *If you need a refill on your cardiac medications before your next appointment, please call your pharmacy*   Lab Work: None    If you have labs (blood work) drawn today and your tests are completely normal, you will receive your results only by: MyChart Message (if you have MyChart) OR A paper copy in the mail If you have any lab test that is abnormal or we need to change your treatment, we will call you to review the results.   Testing/Procedures: None    Follow-Up: At [facility], you and your health needs are our priority.  As part of our continuing mission to provide you with exceptional heart care, we have created designated Provider Care Teams.  These Care Teams include your primary Cardiologist (physician) and Advanced Practice Providers (APPs -  Physician Assistants and Nurse Practitioners) who all work together to provide you with the care you need, when you need it.  We recommend signing up for the patient portal called "MyChart".  Sign up information is provided on this After Visit Summary.  MyChart is used to connect with patients for Virtual Visits (Telemedicine).  Patients are able to view lab/test results, encounter notes, upcoming appointments, etc.  Non-urgent messages can be sent to your provider as well.   To learn more about what you can do with MyChart, go to [email or web address].    Your next appointment:   6 month(s)  The format for your next appointment:   In Person  Provider:   Maisie Fus, MD    Other Instructions

## 2023-10-14 ENCOUNTER — Emergency Department (HOSPITAL_COMMUNITY): Payer: Medicare Other

## 2023-10-14 ENCOUNTER — Other Ambulatory Visit: Payer: Self-pay

## 2023-10-14 ENCOUNTER — Encounter (HOSPITAL_COMMUNITY): Payer: Self-pay | Admitting: Internal Medicine

## 2023-10-14 ENCOUNTER — Inpatient Hospital Stay (HOSPITAL_COMMUNITY)
Admission: EM | Admit: 2023-10-14 | Discharge: 2023-10-17 | DRG: 641 | Disposition: A | Discharge status: Skilled Nursing Facility | Payer: Medicare Other | Source: Skilled Nursing Facility | Attending: Internal Medicine | Admitting: Internal Medicine

## 2023-10-14 DIAGNOSIS — E1122 Type 2 diabetes mellitus with diabetic chronic kidney disease: Secondary | ICD-10-CM | POA: Diagnosis present

## 2023-10-14 DIAGNOSIS — S301XXA Contusion of abdominal wall, initial encounter: Principal | ICD-10-CM | POA: Diagnosis present

## 2023-10-14 DIAGNOSIS — K219 Gastro-esophageal reflux disease without esophagitis: Secondary | ICD-10-CM | POA: Diagnosis present

## 2023-10-14 DIAGNOSIS — J45909 Unspecified asthma, uncomplicated: Secondary | ICD-10-CM | POA: Diagnosis present

## 2023-10-14 DIAGNOSIS — Z888 Allergy status to other drugs, medicaments and biological substances status: Secondary | ICD-10-CM

## 2023-10-14 DIAGNOSIS — Z8616 Personal history of COVID-19: Secondary | ICD-10-CM

## 2023-10-14 DIAGNOSIS — Z7901 Long term (current) use of anticoagulants: Secondary | ICD-10-CM

## 2023-10-14 DIAGNOSIS — I480 Paroxysmal atrial fibrillation: Secondary | ICD-10-CM | POA: Diagnosis present

## 2023-10-14 DIAGNOSIS — Z79899 Other long term (current) drug therapy: Secondary | ICD-10-CM

## 2023-10-14 DIAGNOSIS — E875 Hyperkalemia: Principal | ICD-10-CM | POA: Diagnosis present

## 2023-10-14 DIAGNOSIS — E782 Mixed hyperlipidemia: Secondary | ICD-10-CM | POA: Diagnosis present

## 2023-10-14 DIAGNOSIS — Z6834 Body mass index (BMI) 34.0-34.9, adult: Secondary | ICD-10-CM

## 2023-10-14 DIAGNOSIS — G8929 Other chronic pain: Secondary | ICD-10-CM | POA: Diagnosis present

## 2023-10-14 DIAGNOSIS — Z96611 Presence of right artificial shoulder joint: Secondary | ICD-10-CM | POA: Diagnosis present

## 2023-10-14 DIAGNOSIS — Z8673 Personal history of transient ischemic attack (TIA), and cerebral infarction without residual deficits: Secondary | ICD-10-CM

## 2023-10-14 DIAGNOSIS — N179 Acute kidney failure, unspecified: Secondary | ICD-10-CM | POA: Diagnosis not present

## 2023-10-14 DIAGNOSIS — Z87891 Personal history of nicotine dependence: Secondary | ICD-10-CM

## 2023-10-14 DIAGNOSIS — Z96653 Presence of artificial knee joint, bilateral: Secondary | ICD-10-CM | POA: Diagnosis present

## 2023-10-14 DIAGNOSIS — N1831 Chronic kidney disease, stage 3a: Secondary | ICD-10-CM | POA: Diagnosis present

## 2023-10-14 DIAGNOSIS — E66812 Obesity, class 2: Secondary | ICD-10-CM | POA: Diagnosis present

## 2023-10-14 DIAGNOSIS — Z9049 Acquired absence of other specified parts of digestive tract: Secondary | ICD-10-CM

## 2023-10-14 DIAGNOSIS — I129 Hypertensive chronic kidney disease with stage 1 through stage 4 chronic kidney disease, or unspecified chronic kidney disease: Secondary | ICD-10-CM | POA: Diagnosis present

## 2023-10-14 DIAGNOSIS — Z66 Do not resuscitate: Secondary | ICD-10-CM | POA: Diagnosis present

## 2023-10-14 DIAGNOSIS — Z96643 Presence of artificial hip joint, bilateral: Secondary | ICD-10-CM | POA: Diagnosis present

## 2023-10-14 DIAGNOSIS — D509 Iron deficiency anemia, unspecified: Secondary | ICD-10-CM | POA: Diagnosis present

## 2023-10-14 DIAGNOSIS — W1830XA Fall on same level, unspecified, initial encounter: Secondary | ICD-10-CM | POA: Diagnosis present

## 2023-10-14 DIAGNOSIS — Z85828 Personal history of other malignant neoplasm of skin: Secondary | ICD-10-CM

## 2023-10-14 DIAGNOSIS — Z7982 Long term (current) use of aspirin: Secondary | ICD-10-CM

## 2023-10-14 DIAGNOSIS — M1990 Unspecified osteoarthritis, unspecified site: Secondary | ICD-10-CM | POA: Diagnosis present

## 2023-10-14 DIAGNOSIS — Z885 Allergy status to narcotic agent status: Secondary | ICD-10-CM

## 2023-10-14 DIAGNOSIS — Z833 Family history of diabetes mellitus: Secondary | ICD-10-CM

## 2023-10-14 LAB — URINALYSIS, ROUTINE W REFLEX MICROSCOPIC
Bilirubin Urine: NEGATIVE
Glucose, UA: NEGATIVE mg/dL
Hgb urine dipstick: NEGATIVE
Ketones, ur: NEGATIVE mg/dL
Leukocytes,Ua: NEGATIVE
Nitrite: NEGATIVE
Protein, ur: NEGATIVE mg/dL
Specific Gravity, Urine: 1.016 (ref 1.005–1.030)
pH: 5 (ref 5.0–8.0)

## 2023-10-14 LAB — CBC WITH DIFFERENTIAL/PLATELET
Abs Immature Granulocytes: 0.03 10*3/uL (ref 0.00–0.07)
Abs Immature Granulocytes: 0.04 10*3/uL (ref 0.00–0.07)
Basophils Absolute: 0 10*3/uL (ref 0.0–0.1)
Basophils Absolute: 0 10*3/uL (ref 0.0–0.1)
Basophils Relative: 0 %
Basophils Relative: 0 %
Eosinophils Absolute: 0.1 10*3/uL (ref 0.0–0.5)
Eosinophils Absolute: 0.1 10*3/uL (ref 0.0–0.5)
Eosinophils Relative: 1 %
Eosinophils Relative: 1 %
HCT: 32.7 % — ABNORMAL LOW (ref 36.0–46.0)
HCT: 29.7 % — ABNORMAL LOW (ref 36.0–46.0)
Hemoglobin: 10.1 g/dL — ABNORMAL LOW (ref 12.0–15.0)
Hemoglobin: 9 g/dL — ABNORMAL LOW (ref 12.0–15.0)
Immature Granulocytes: 0 %
Immature Granulocytes: 1 %
Lymphocytes Relative: 18 %
Lymphocytes Relative: 16 %
Lymphs Abs: 1.5 10*3/uL (ref 0.7–4.0)
Lymphs Abs: 1.3 10*3/uL (ref 0.7–4.0)
MCH: 28.7 pg (ref 26.0–34.0)
MCH: 28.5 pg (ref 26.0–34.0)
MCHC: 30.9 g/dL (ref 30.0–36.0)
MCHC: 30.3 g/dL (ref 30.0–36.0)
MCV: 92.9 fL (ref 80.0–100.0)
MCV: 94 fL (ref 80.0–100.0)
Monocytes Absolute: 0.6 10*3/uL (ref 0.1–1.0)
Monocytes Absolute: 0.6 10*3/uL (ref 0.1–1.0)
Monocytes Relative: 8 %
Monocytes Relative: 7 %
Neutro Abs: 5.8 10*3/uL (ref 1.7–7.7)
Neutro Abs: 5.9 10*3/uL (ref 1.7–7.7)
Neutrophils Relative %: 73 %
Neutrophils Relative %: 75 %
Platelets: 218 10*3/uL (ref 150–400)
Platelets: 198 10*3/uL (ref 150–400)
RBC: 3.52 MIL/uL — ABNORMAL LOW (ref 3.87–5.11)
RBC: 3.16 MIL/uL — ABNORMAL LOW (ref 3.87–5.11)
RDW: 13.9 % (ref 11.5–15.5)
RDW: 13.9 % (ref 11.5–15.5)
WBC: 8 10*3/uL (ref 4.0–10.5)
WBC: 7.9 10*3/uL (ref 4.0–10.5)
nRBC: 0 % (ref 0.0–0.2)
nRBC: 0 % (ref 0.0–0.2)

## 2023-10-14 LAB — BASIC METABOLIC PANEL
Anion gap: 6 (ref 5–15)
Anion gap: 7 (ref 5–15)
BUN: 41 mg/dL — ABNORMAL HIGH (ref 8–23)
BUN: 38 mg/dL — ABNORMAL HIGH (ref 8–23)
CO2: 19 mmol/L — ABNORMAL LOW (ref 22–32)
CO2: 21 mmol/L — ABNORMAL LOW (ref 22–32)
Calcium: 8.8 mg/dL — ABNORMAL LOW (ref 8.9–10.3)
Calcium: 8.7 mg/dL — ABNORMAL LOW (ref 8.9–10.3)
Chloride: 112 mmol/L — ABNORMAL HIGH (ref 98–111)
Chloride: 109 mmol/L (ref 98–111)
Creatinine, Ser: 1.38 mg/dL — ABNORMAL HIGH (ref 0.44–1.00)
Creatinine, Ser: 1.26 mg/dL — ABNORMAL HIGH (ref 0.44–1.00)
GFR, Estimated: 37 mL/min — ABNORMAL LOW (ref >60)
GFR, Estimated: 41 mL/min — ABNORMAL LOW (ref >60)
Glucose, Bld: 127 mg/dL — ABNORMAL HIGH (ref 70–99)
Glucose, Bld: 127 mg/dL — ABNORMAL HIGH (ref 70–99)
Potassium: 6.2 mmol/L — ABNORMAL HIGH (ref 3.5–5.1)
Potassium: 5.9 mmol/L — ABNORMAL HIGH (ref 3.5–5.1)
Sodium: 137 mmol/L (ref 135–145)
Sodium: 137 mmol/L (ref 135–145)

## 2023-10-14 LAB — CBG MONITORING, ED: Glucose-Capillary: 87 mg/dL (ref 70–99)

## 2023-10-14 MED ORDER — AMLODIPINE BESYLATE 5 MG PO TABS
10.0000 mg | ORAL_TABLET | Freq: Every day | ORAL | Status: DC
  Administered 2023-10-14 – 2023-10-17 (×4): 10 mg via ORAL
  Filled 2023-10-14 (×4): qty 2

## 2023-10-14 MED ORDER — OXYCODONE-ACETAMINOPHEN 5-325 MG PO TABS
1.0000 | ORAL_TABLET | Freq: Once | ORAL | Status: AC
  Administered 2023-10-14: 1 via ORAL
  Filled 2023-10-14: qty 1

## 2023-10-14 MED ORDER — ONDANSETRON HCL 4 MG PO TABS: 4.0000 mg | ORAL_TABLET | Freq: Four times a day (QID) | ORAL | Status: DC | PRN

## 2023-10-14 MED ORDER — SODIUM CHLORIDE 0.9 % IV BOLUS
500.0000 mL | Freq: Once | INTRAVENOUS | Status: AC
Start: 2023-10-14 — End: 2023-10-14
  Administered 2023-10-14: 500 mL via INTRAVENOUS

## 2023-10-14 MED ORDER — SODIUM ZIRCONIUM CYCLOSILICATE 5 G PO PACK
10.0000 g | PACK | Freq: Once | ORAL | Status: AC
  Administered 2023-10-14: 10 g via ORAL
  Filled 2023-10-14: qty 2

## 2023-10-14 MED ORDER — ACETAMINOPHEN 325 MG PO TABS
650.0000 mg | ORAL_TABLET | Freq: Four times a day (QID) | ORAL | Status: DC | PRN
  Administered 2023-10-15: 650 mg via ORAL
  Filled 2023-10-14: qty 2

## 2023-10-14 MED ORDER — DEXTROSE 50 % IV SOLN: 1.0000 | Freq: Once | INTRAVENOUS | Status: DC

## 2023-10-14 MED ORDER — QUETIAPINE FUMARATE 25 MG PO TABS
75.0000 mg | ORAL_TABLET | Freq: Two times a day (BID) | ORAL | Status: DC
  Administered 2023-10-14 – 2023-10-17 (×7): 75 mg via ORAL
  Filled 2023-10-14 (×7): qty 3

## 2023-10-14 MED ORDER — BUSPIRONE HCL 5 MG PO TABS
5.0000 mg | ORAL_TABLET | Freq: Two times a day (BID) | ORAL | Status: DC
  Administered 2023-10-14 – 2023-10-17 (×7): 5 mg via ORAL
  Filled 2023-10-14 (×7): qty 1

## 2023-10-14 MED ORDER — ALPRAZOLAM 0.5 MG PO TABS
0.5000 mg | ORAL_TABLET | Freq: Two times a day (BID) | ORAL | Status: DC | PRN
  Administered 2023-10-15 – 2023-10-16 (×4): 0.5 mg via ORAL
  Filled 2023-10-14 (×4): qty 1

## 2023-10-14 MED ORDER — DONEPEZIL HCL 5 MG PO TABS
5.0000 mg | ORAL_TABLET | Freq: Every day | ORAL | Status: DC
  Administered 2023-10-14 – 2023-10-16 (×3): 5 mg via ORAL
  Filled 2023-10-14 (×3): qty 1

## 2023-10-14 MED ORDER — CALCIUM GLUCONATE-NACL 1-0.675 GM/50ML-% IV SOLN
1.0000 g | Freq: Once | INTRAVENOUS | Status: AC
  Administered 2023-10-14: 1000 mg via INTRAVENOUS
  Filled 2023-10-14: qty 50

## 2023-10-14 MED ORDER — OXYCODONE HCL 5 MG PO TABS
5.0000 mg | ORAL_TABLET | Freq: Three times a day (TID) | ORAL | Status: DC | PRN
  Administered 2023-10-14 – 2023-10-16 (×2): 5 mg via ORAL
  Filled 2023-10-14 (×2): qty 1

## 2023-10-14 MED ORDER — HYDRALAZINE HCL 25 MG PO TABS
100.0000 mg | ORAL_TABLET | Freq: Two times a day (BID) | ORAL | Status: DC
  Administered 2023-10-14 – 2023-10-16 (×6): 100 mg via ORAL
  Filled 2023-10-14 (×6): qty 4

## 2023-10-14 MED ORDER — CLONIDINE HCL 0.1 MG PO TABS
0.2000 mg | ORAL_TABLET | Freq: Three times a day (TID) | ORAL | Status: DC
  Administered 2023-10-14 – 2023-10-17 (×10): 0.2 mg via ORAL
  Filled 2023-10-14 (×2): qty 2
  Filled 2023-10-14: qty 1
  Filled 2023-10-14 (×7): qty 2

## 2023-10-14 MED ORDER — SODIUM CHLORIDE 0.9 % IV SOLN: INTRAVENOUS | Status: DC

## 2023-10-14 MED ORDER — INSULIN ASPART 100 UNIT/ML IV SOLN
5.0000 [IU] | Freq: Once | INTRAVENOUS | Status: AC
  Administered 2023-10-14: 5 [IU] via INTRAVENOUS

## 2023-10-14 MED ORDER — ONDANSETRON HCL 4 MG/2ML IJ SOLN: 4.0000 mg | Freq: Four times a day (QID) | INTRAMUSCULAR | Status: DC | PRN

## 2023-10-14 MED ORDER — ACETAMINOPHEN 650 MG RE SUPP: 650.0000 mg | Freq: Four times a day (QID) | RECTAL | Status: DC | PRN

## 2023-10-14 MED ORDER — METOPROLOL TARTRATE 25 MG PO TABS
25.0000 mg | ORAL_TABLET | Freq: Two times a day (BID) | ORAL | Status: DC
  Administered 2023-10-14 – 2023-10-17 (×7): 25 mg via ORAL
  Filled 2023-10-14 (×7): qty 1

## 2023-10-14 MED ORDER — EZETIMIBE 10 MG PO TABS
10.0000 mg | ORAL_TABLET | Freq: Every day | ORAL | Status: DC
Start: 2023-10-14 — End: 2023-10-17
  Administered 2023-10-14 – 2023-10-17 (×4): 10 mg via ORAL
  Filled 2023-10-14 (×4): qty 1

## 2023-10-14 NOTE — ED Provider Notes (Signed)
[place] EMERGENCY DEPARTMENT AT [facility] Provider Note   CSN: [id number] Arrival date & time: 10/14/23  4034     History {Add pertinent medical, surgical, social history, OB history to HPI:1} Chief Complaint  Patient presents with   [name] is a 89 y.o. female.  Presents to the emergency department from skilled nursing facility after an unwitnessed ground-level fall.  Patient complaining of pain on the right flank area.  EMS reports a large bruise in the area.      Home Medications Prior to Admission medications   Medication Sig Start Date End Date Taking? Authorizing Provider  acetaminophen (TYLENOL) 325 MG tablet Take 650 mg by mouth every 6 (six) hours as needed.    [provider]  ALPRAZolam Prudy Feeler) 0.5 MG tablet Take 1 tablet (0.5 mg total) by mouth 2 (two) times daily as needed for anxiety. Patient taking differently: Take 0.5 mg by mouth 3 (three) times daily. 03/05/23   Vassie Loll, MD  amLODipine (NORVASC) 10 MG tablet Take 10 mg by mouth daily.    [provider]  apixaban (ELIQUIS) 5 MG TABS tablet Take 5 mg by mouth 2 (two) times daily.    [provider]  aspirin EC 81 MG tablet Take 81 mg by mouth daily. Swallow whole.    [provider]  B Complex-C (SUPER B COMPLEX PO) Take 1 tablet by mouth daily.    [provider]  busPIRone (BUSPAR) 5 MG tablet Take 5 mg by mouth 2 (two) times daily. 02/20/22   [provider]  Cholecalciferol 50 MCG (2000 UT) CAPS Take 2,000 Units by mouth daily at 6 (six) AM.    [provider]  cloNIDine (CATAPRES) 0.2 MG tablet Take 1 tablet (0.2 mg total) by mouth 3 (three) times daily. 03/05/23   Vassie Loll, MD  donepezil (ARICEPT) 5 MG tablet Take 5 mg by mouth at bedtime.    [provider]  ezetimibe (ZETIA) 10 MG tablet Take 10 mg by mouth daily. 10/03/20   [provider]  gabapentin (NEURONTIN) 300 MG capsule Take 1  capsule (300 mg total) by mouth 3 (three) times daily. Patient taking differently: Take 300 mg by mouth daily. 12/01/17   Rodolph Bong, MD  hydrALAZINE (APRESOLINE) 50 MG tablet Take 1 tablet (50 mg total) by mouth every 8 (eight) hours. 03/05/23   Vassie Loll, MD  losartan (COZAAR) 50 MG tablet Take 50 mg by mouth daily.    [provider]  metoprolol tartrate (LOPRESSOR) 50 MG tablet Take 1 tablet (50 mg total) by mouth 2 (two) times daily. Patient taking differently: Take 50 mg by mouth daily at 6 (six) AM. 03/05/23   Vassie Loll, MD  Multiple Vitamins-Minerals (MULTIVITAMIN WOMENS 50+ ADV) TABS Take 1 tablet by mouth daily.    [provider]  oxyCODONE (ROXICODONE) 5 MG immediate release tablet Take 1 tablet (5 mg total) by mouth every 8 (eight) hours as needed for severe pain. Take 1 tablet by mouth every 4-6 hours as needed for post op pain Patient not taking: Reported on 08/07/2023 03/05/23   Vassie Loll, MD  QUEtiapine (SEROQUEL) 25 MG tablet Take 0.5 tablets (12.5 mg total) by mouth 2 (two) times daily. Patient not taking: Reported on 08/07/2023 03/05/23   Vassie Loll, MD  saccharomyces boulardii (FLORASTOR) 250 MG capsule Take 250 mg by mouth daily at 6 (six) AM.    [provider]  sertraline (  ZOLOFT) 50 MG tablet Take 50 mg by mouth at bedtime. 06/18/23   [provider]  SERTRALINE HCL PO Take 75 mg by mouth in the morning.    [provider]      Allergies    Lisinopril, Hydrochlorothiazide, Lipitor [atorvastatin calcium], Thiazide-type diuretics, Zocor [simvastatin], and Codeine    Review of Systems   Review of Systems  Physical Exam Updated Vital Signs Ht 5\' 3"  (1.6 m)   Wt 88.2 kg   BMI 34.44 kg/m  Physical Exam Vitals and nursing note reviewed.  HENT:     Head: Atraumatic.  Eyes:     Pupils: Pupils are equal, round, and reactive to light.  Cardiovascular:     Rate and Rhythm: Normal rate.  Pulmonary:      Effort: Pulmonary effort is normal.  Abdominal:     Palpations: Abdomen is soft.  Musculoskeletal:     Cervical back: Neck supple.     Lumbar back: Tenderness present.       Back:  Neurological:     Mental Status: She is alert.    ED Results / Procedures / Treatments   Labs (all labs ordered are listed, but only abnormal results are displayed) Labs Reviewed - No data to display  EKG None  Radiology No results found.  Procedures Procedures  {Document cardiac monitor, telemetry assessment procedure when appropriate:1}  Medications Ordered in ED Medications - No data to display  ED Course/ Medical Decision Making/ A&P   {   Click here for ABCD2, HEART and other calculatorsREFRESH Note before signing :1}                              Medical Decision Making Amount and/or Complexity of Data Reviewed Radiology: ordered and independent interpretation performed. Decision-making details documented in ED Course.   Differential diagnosis considered includes, but not limited to: Blunt trauma including intracranial injury, spinal injury, thoracic injury, intra-abdominal and retroperitoneal injury, orthopedic injury  Presents to the emergency department for evaluation after a ground-level fall.  Patient resides in a skilled nursing facility.  She arrives by ambulance, has some bruising on her right flank area.  Moving both lower extremities, no obvious hip injury.  Patient is on Eliquis, no scalp lacerations or contusion but CT head and cervical spine performed because of possibility of injury and bleeding on blood thinners.  CT head and cervical spine without acute findings.  Patient had CT abdomen and pelvis to rule out retroperitoneal bleed and lumbar reconstructions because of her low back pain.  {Document critical care time when appropriate:1} {Document review of labs and clinical decision tools ie heart score, Chads2Vasc2 etc:1}  {Document your independent review of radiology  images, and any outside records:1} {Document your discussion with family members, caretakers, and with consultants:1} {Document social determinants of health affecting pt's care:1} {Document your decision making why or why not admission, treatments were needed:1} Final Clinical Impression(s) / ED Diagnoses Final diagnoses:  None    Rx / DC Orders ED Discharge Orders     None

## 2023-10-14 NOTE — H&P (Signed)
History and Physical    Patient: [name] YQI:[id number] DOB: 1931/12/05 DOA: 10/14/2023 DOS: the patient was seen and examined on 10/14/2023 PCP: Corrington, Meredith Mody, MD  Patient coming from: [facility] (skilled nursing facility)  Chief Complaint:  Chief Complaint  Patient presents with   Fall   HPI: [name] is a 89 y.o. female with medical history significant of hyperlipidemia, hypertension, type 2 diabetes mellitus with nephropathy, chronic kidney disease stage III a, GERD, depression/anxiety, paroxysmal atrial fibrillation (chronically on Eliquis) and adrenal gland cyst; who presented from the skilled nursing facility after experiencing an unwitnessed fall. No acute fracture appreciated on workup in the ED; patient with significant right flank hematoma (recommended for observation and holding anticoagulation) as per trauma surgeon.  Patient was also noticed to be hyperkalemic and with acute kidney injury.  Gentle fluid resuscitation has been initiated; patient received Lokelma, bicarbonate and calcium gluconate.  TRH contacted to place in the hospital for further evaluation and management.  Review of Systems: As mentioned in the history of present illness. All other systems reviewed and are negative. Past Medical History:  Diagnosis Date   Anxiety    Arthritis    Asthma    Cancer (HCC)    SKIN CA OF FACE   Chronic kidney disease    Chronic pain    COVID    moderate case - had pneumonia   Depression    Diabetes mellitus without complication (HCC)    Gallstones    GERD (gastroesophageal reflux disease)    Hyperlipidemia    Hypertension    Insomnia    Iron deficiency anemia    Peri-prosthetic patellar fracture 09/19/2022   Pneumonia    Prosthetic joint infection (HCC)    RBBB 03/29/2022   TIA (transient ischemic attack)    Past Surgical History:  Procedure Laterality Date    ARM FRACTURE     LEFT  and right   CHOLECYSTECTOMY     DILATION AND CURETTAGE  OF UTERUS     IRRIGATION AND DEBRIDEMENT KNEE  09/12/2011   Procedure: IRRIGATION AND DEBRIDEMENT KNEE;  Surgeon: Nestor Lewandowsky;  Location: [facility] OR;  Service: Orthopedics;  Laterality: Right;  I&D RIGHT TKA REVISE BEARINGS/MBT. Poly exchange   PATELLAR TENDON REPAIR Left 09/21/2022   Procedure: LEFT PATELLAR TENDON REPAIR;  Surgeon: Gean Birchwood, MD;  Location: [facility] OR;  Service: Orthopedics;  Laterality: Left;   REPLACEMENT TOTAL KNEE     right   TOTAL HIP ARTHROPLASTY Left    TOTAL HIP ARTHROPLASTY Right 01/20/2018   Procedure: TOTAL HIP ARTHROPLASTY WITH REMOVAL OF INTRAMEDULLARY NAIL;  Surgeon: Gean Birchwood, MD;  Location: [facility] OR;  Service: Orthopedics;  Laterality: Right;   TOTAL KNEE ARTHROPLASTY Left 09/03/2022   Procedure: LEFT TOTAL KNEE ARTHROPLASTY;  Surgeon: Gean Birchwood, MD;  Location: [facility] ORS;  Service: Orthopedics;  Laterality: Left;   TOTAL SHOULDER ARTHROPLASTY Right 10/02/2018   Procedure: REVERSE TOTAL SHOULDER;  Surgeon: Jones Broom, MD;  Location: [facility] OR;  Service: Orthopedics;  Laterality: Right;   Social History:  reports that she quit smoking about 45 years ago. Her smoking use included cigarettes. She started smoking about 55 years ago. She has a 2.5 pack-year smoking history. She has never used smokeless tobacco. She reports that she does not drink alcohol and does not use drugs.  Allergies  Allergen Reactions   Lisinopril Swelling    angioedema   Hydrochlorothiazide Other (See Comments)    UNSPECIFIED REACTION    Lipitor [  Atorvastatin Calcium] Other (See Comments)    UNSPECIFIED REACTION    Thiazide-Type Diuretics Other (See Comments)    UNSPECIFIED REACTION    Zocor [Simvastatin] Swelling    SWELLING REACTION UNSPECIFIED    Codeine Nausea Only    Family History  Problem Relation Age of Onset   Diabetes Mellitus II Daughter     Prior to Admission medications   Medication Sig Start Date End Date Taking? Authorizing Provider  acetaminophen (TYLENOL) 325  MG tablet Take 650 mg by mouth every 6 (six) hours as needed.   Yes [provider]  ALPRAZolam (XANAX) 0.5 MG tablet Take 1 tablet (0.5 mg total) by mouth 2 (two) times daily as needed for anxiety. Patient taking differently: Take 0.5 mg by mouth 3 (three) times daily. 03/05/23  Yes Vassie Loll, MD  amLODipine (NORVASC) 10 MG tablet Take 10 mg by mouth daily.   Yes [provider]  apixaban (ELIQUIS) 5 MG TABS tablet Take 5 mg by mouth 2 (two) times daily.   Yes [provider]  aspirin EC 81 MG tablet Take 81 mg by mouth daily. Swallow whole.   Yes [provider]  B Complex-C (SUPER B COMPLEX PO) Take 1 tablet by mouth daily.   Yes [provider]  busPIRone (BUSPAR) 5 MG tablet Take 5 mg by mouth 2 (two) times daily. 02/20/22  Yes [provider]  Cholecalciferol 50 MCG (2000 UT) CAPS Take 2,000 Units by mouth daily at 6 (six) AM.   Yes [provider]  cloNIDine (CATAPRES) 0.2 MG tablet Take 1 tablet (0.2 mg total) by mouth 3 (three) times daily. 03/05/23  Yes Vassie Loll, MD  donepezil (ARICEPT) 5 MG tablet Take 5 mg by mouth at bedtime.   Yes [provider]  ezetimibe (ZETIA) 10 MG tablet Take 10 mg by mouth daily. 10/03/20  Yes [provider]  gabapentin (NEURONTIN) 300 MG capsule Take 1 capsule (300 mg total) by mouth 3 (three) times daily. Patient taking differently: Take 300 mg by mouth at bedtime. 12/01/17  Yes Rodolph Bong, MD  hydrALAZINE (APRESOLINE) 50 MG tablet Take 1 tablet (50 mg total) by mouth every 8 (eight) hours. Patient taking differently: Take 100 mg by mouth every 8 (eight) hours. 03/05/23  Yes Vassie Loll, MD  losartan (COZAAR) 50 MG tablet Take 50 mg by mouth daily.   Yes [provider]  metoprolol tartrate (LOPRESSOR) 50 MG tablet Take 1 tablet (50 mg total) by mouth 2 (two) times daily. Patient taking differently: Take 50 mg by mouth daily at 6 (six) AM. 03/05/23  Yes  Vassie Loll, MD  Multiple Vitamins-Minerals (MULTIVITAMIN WOMENS 50+ ADV) TABS Take 1 tablet by mouth daily.   Yes [provider]  oxyCODONE (ROXICODONE) 5 MG immediate release tablet Take 1 tablet (5 mg total) by mouth every 8 (eight) hours as needed for severe pain. Take 1 tablet by mouth every 4-6 hours as needed for post op pain 03/05/23  Yes Vassie Loll, MD  QUEtiapine (SEROQUEL) 25 MG tablet Take 75 mg by mouth See admin instructions. Take 75 mg in the morning and 75 mg at bedtime   Yes [provider]  saccharomyces boulardii (FLORASTOR) 250 MG capsule Take 250 mg by mouth daily at 6 (six) AM.   Yes [provider]  SERTRALINE HCL PO Take 75 mg by mouth in the morning.   Yes [provider]    Physical Exam: Vitals:   10/14/23 7829  10/14/23 0615 10/14/23 0645 10/14/23 0700  BP: (!) 152/78 (!) 153/76 (!) 166/85 136/70  Pulse: 74 86 95 77  Resp: 11 13 14 13   Temp:      TempSrc:      SpO2: 98% 94% 96% 95%  Weight:      Height:       General exam: Alert, awake, following commands appropriately.  Good saturation on room air.  In no major distress. Respiratory system: Clear to auscultation. Respiratory effort normal.  Good saturation on room air. Cardiovascular system: Rate controlled, no rubs, no gallops, no JVD. Gastrointestinal system: Abdomen is obese, nondistended, soft and nontender. No organomegaly or masses felt. Normal bowel sounds heard. Central nervous system:  No focal neurological deficits. Extremities: No cyanosis or clubbing; 1-2+ edema appreciated bilaterally (per patient chronic and unchanged). Skin: Positive right flank bruise/hematoma appreciated on exam; refer to epic media for details. Psychiatry: Judgement and insight appear normal. Mood & affect appropriate.   Data Reviewed: Basic metabolic panel: Sodium 137, potassium 6.2, chloride 112, bicarb 19, BUN 41, creatinine 1.38, GFR 37 CBC: WBCs 8.0, hemoglobin 10.1 and  platelet count 218K UA: Demonstrating specific gravity 1.0 16, negative hemoglobin urine dipstick, no ketones, no protein, no nitrites, no leukocyte esterase.   Assessment and Plan: 1-acute kidney injury on chronic renal failure stage IIIa -Most likely associated with prerenal azotemia -Will provide fluid resuscitation -Follow-up renal function trend -Urinalysis no suggesting the presence of acute infection -Minimize nephrotoxic agents and the use of contrast.  2-flank hematoma -Continue supportive care and observation -Holding Eliquis at the moment.  3-mixed hyperlipidemia -Continue heart healthy diet and the use of Zetia  4-history of CVA -No new focal deficits -Continue risk factor modifications.  5-paroxysmal atrial fibrillation -Rate controlled -Holding anticoagulation in the setting of acute flank hematoma -Telemetry monitoring in place.  6-adrenal gland cyst -Per radiology recommendations 6-24-month repeat images recommended -Diabetes on changes in adrenal cyst size.  7-osteoarthritis -Continue supportive care -PT evaluation requested -Continue as needed analgesics  8-hypertension -Continue current antihypertensive agents and follow vital signs.  9-hyperkalemia -Lokelma, calcium gluconate, insulin and bicarbonate provided at time of admission -Fluid resuscitation will be given -Follow electrolytes trend -Telemetry in place.  10-class II obesity -Body mass index is 34.44 kg/m.  -Low-calorie diet and portion control discussed with patient.    Advance Care Planning:   Code Status: Limited: Do not attempt resuscitation (DNR) -DNR-LIMITED -Do Not Intubate/DNI    Consults: Nephrology service curbside; EDP has also curbside trauma surgery.  Family Communication: No family at bedside.  Severity of Illness: The appropriate patient status for this patient is OBSERVATION. Observation status is judged to be reasonable and necessary in order to provide the  required intensity of service to ensure the patient's safety. The patient's presenting symptoms, physical exam findings, and initial radiographic and laboratory data in the context of their medical condition is felt to place them at decreased risk for further clinical deterioration. Furthermore, it is anticipated that the patient will be medically stable for discharge from the hospital within 2 midnights of admission.   Author: Vassie Loll, MD 10/14/2023 9:30 AM  For on call review [email or web address].

## 2023-10-14 NOTE — ED Notes (Signed)
Patient transported to CT 

## 2023-10-14 NOTE — ED Provider Notes (Signed)
7:15 AM Care transferred to me.  Patient pending CBC now and then in about 3 hours to make sure there is no significant blood loss/anemia.  Will also observe her to make sure this hematoma does not appear to expand.  Will apply ice.  She feels relatively comfortable and tells me that her knees are chronically bad and her legs seem to give out on her this morning.  She did not pass out.  8:50 AM Patient's blood work shows stable to mildly low hemoglobin compared to baseline at 10.  However her kidney function is a little worse with a mild AKI and she has a potassium of 6.2, possibly from her trauma.  Will start fluids, give her Lokelma, insulin and glucose.  She does have some minimally peaked T waves that are changed from earlier in the visit and so I will also give her a dose of calcium.  I did briefly discuss with trauma surgery, Dr. Janee Morn, can consider an abdominal binder to help prevent this from swelling and expanding but otherwise this could be monitored.  Jeani Hawking with serial hemoglobins (expected to drop some) and supportive care such as the binder and holding Eliquis.  No need for trauma surgery evaluation or transfer to [facility].  Will consult nephrology and admit to the hospitalist service.  9:12 AM Dr. Arrie Aran of nephrology consulted and recommend supportive care and if her potassium does not improve with treatment such as fluids, insulin, glucose, Lokelma then hospitalist can consult nephrology again in the hospital.  Discussed with Dr. Gwenlyn Perking, who will admit.   EKG Interpretation Date/Time:  Monday October 14 2023 08:40:39 EST Ventricular Rate:  105 PR Interval:  138 QRS Duration:  138 QT Interval:  368 QTC Calculation: 487 R Axis:   158  Text Interpretation: Atrial fibrillation RBBB and LPFB mildly peaked T waves Confirmed by Pricilla Loveless [phone]) on 10/14/2023 8:44:14 AM         CRITICAL CARE Performed by: Audree Camel   Total critical care time: 35  minutes  Critical care time was exclusive of separately billable procedures and treating other patients.  Critical care was necessary to treat or prevent imminent or life-threatening deterioration.  Critical care was time spent personally by me on the following activities: development of treatment plan with patient and/or surrogate as well as nursing, discussions with consultants, evaluation of patient's response to treatment, examination of patient, obtaining history from patient or surrogate, ordering and performing treatments and interventions, ordering and review of laboratory studies, ordering and review of radiographic studies, pulse oximetry and re-evaluation of patient's condition.    Pricilla Loveless, MD 10/14/23 [phone]

## 2023-10-14 NOTE — ED Triage Notes (Signed)
Pt BIB [facility] from [facility]. Pt had an unwitnessed fall and is on blood thinners. Hematoma observed on lower back.

## 2023-10-14 NOTE — Progress Notes (Signed)
Abd binder applied.  Removed ice pack  and turned on back.  New IV

## 2023-10-14 NOTE — Progress Notes (Signed)
Alert and oriented.  Bruising to lower back.  Ice pack applied and called materials for abd binder. Scds applied. Vitals stable.  Given oxycodone for back pain rated 8.  Stated that walks with walker at [facility]

## 2023-10-15 DIAGNOSIS — E66812 Obesity, class 2: Secondary | ICD-10-CM | POA: Diagnosis present

## 2023-10-15 DIAGNOSIS — I129 Hypertensive chronic kidney disease with stage 1 through stage 4 chronic kidney disease, or unspecified chronic kidney disease: Secondary | ICD-10-CM | POA: Diagnosis present

## 2023-10-15 DIAGNOSIS — Z7982 Long term (current) use of aspirin: Secondary | ICD-10-CM | POA: Diagnosis not present

## 2023-10-15 DIAGNOSIS — G8929 Other chronic pain: Secondary | ICD-10-CM | POA: Diagnosis present

## 2023-10-15 DIAGNOSIS — I480 Paroxysmal atrial fibrillation: Secondary | ICD-10-CM | POA: Diagnosis present

## 2023-10-15 DIAGNOSIS — E782 Mixed hyperlipidemia: Secondary | ICD-10-CM | POA: Diagnosis present

## 2023-10-15 DIAGNOSIS — Z833 Family history of diabetes mellitus: Secondary | ICD-10-CM | POA: Diagnosis not present

## 2023-10-15 DIAGNOSIS — N1831 Chronic kidney disease, stage 3a: Secondary | ICD-10-CM | POA: Diagnosis present

## 2023-10-15 DIAGNOSIS — Z66 Do not resuscitate: Secondary | ICD-10-CM | POA: Diagnosis present

## 2023-10-15 DIAGNOSIS — Z8673 Personal history of transient ischemic attack (TIA), and cerebral infarction without residual deficits: Secondary | ICD-10-CM | POA: Diagnosis not present

## 2023-10-15 DIAGNOSIS — Z8616 Personal history of COVID-19: Secondary | ICD-10-CM | POA: Diagnosis not present

## 2023-10-15 DIAGNOSIS — Z7901 Long term (current) use of anticoagulants: Secondary | ICD-10-CM | POA: Diagnosis not present

## 2023-10-15 DIAGNOSIS — Z96643 Presence of artificial hip joint, bilateral: Secondary | ICD-10-CM | POA: Diagnosis present

## 2023-10-15 DIAGNOSIS — E875 Hyperkalemia: Secondary | ICD-10-CM | POA: Diagnosis present

## 2023-10-15 DIAGNOSIS — S301XXA Contusion of abdominal wall, initial encounter: Secondary | ICD-10-CM | POA: Diagnosis present

## 2023-10-15 DIAGNOSIS — Z96611 Presence of right artificial shoulder joint: Secondary | ICD-10-CM | POA: Diagnosis present

## 2023-10-15 DIAGNOSIS — M1990 Unspecified osteoarthritis, unspecified site: Secondary | ICD-10-CM | POA: Diagnosis present

## 2023-10-15 DIAGNOSIS — J45909 Unspecified asthma, uncomplicated: Secondary | ICD-10-CM | POA: Diagnosis present

## 2023-10-15 DIAGNOSIS — Z96653 Presence of artificial knee joint, bilateral: Secondary | ICD-10-CM | POA: Diagnosis present

## 2023-10-15 DIAGNOSIS — N179 Acute kidney failure, unspecified: Secondary | ICD-10-CM | POA: Diagnosis present

## 2023-10-15 DIAGNOSIS — Z87891 Personal history of nicotine dependence: Secondary | ICD-10-CM | POA: Diagnosis not present

## 2023-10-15 DIAGNOSIS — W1830XA Fall on same level, unspecified, initial encounter: Secondary | ICD-10-CM | POA: Diagnosis present

## 2023-10-15 DIAGNOSIS — D509 Iron deficiency anemia, unspecified: Secondary | ICD-10-CM | POA: Diagnosis present

## 2023-10-15 DIAGNOSIS — E1122 Type 2 diabetes mellitus with diabetic chronic kidney disease: Secondary | ICD-10-CM | POA: Diagnosis present

## 2023-10-15 DIAGNOSIS — K219 Gastro-esophageal reflux disease without esophagitis: Secondary | ICD-10-CM | POA: Diagnosis present

## 2023-10-15 LAB — BASIC METABOLIC PANEL
Anion gap: 7 (ref 5–15)
BUN: 36 mg/dL — ABNORMAL HIGH (ref 8–23)
CO2: 21 mmol/L — ABNORMAL LOW (ref 22–32)
Calcium: 8.6 mg/dL — ABNORMAL LOW (ref 8.9–10.3)
Chloride: 111 mmol/L (ref 98–111)
Creatinine, Ser: 1.27 mg/dL — ABNORMAL HIGH (ref 0.44–1.00)
GFR, Estimated: 40 mL/min — ABNORMAL LOW (ref >60)
Glucose, Bld: 116 mg/dL — ABNORMAL HIGH (ref 70–99)
Potassium: 5.5 mmol/L — ABNORMAL HIGH (ref 3.5–5.1)
Sodium: 139 mmol/L (ref 135–145)

## 2023-10-15 LAB — CBC
HCT: 26.9 % — ABNORMAL LOW (ref 36.0–46.0)
Hemoglobin: 8.3 g/dL — ABNORMAL LOW (ref 12.0–15.0)
MCH: 29.2 pg (ref 26.0–34.0)
MCHC: 30.9 g/dL (ref 30.0–36.0)
MCV: 94.7 fL (ref 80.0–100.0)
Platelets: 202 10*3/uL (ref 150–400)
RBC: 2.84 MIL/uL — ABNORMAL LOW (ref 3.87–5.11)
RDW: 14.1 % (ref 11.5–15.5)
WBC: 6.3 10*3/uL (ref 4.0–10.5)
nRBC: 0 % (ref 0.0–0.2)

## 2023-10-15 MED ORDER — SODIUM ZIRCONIUM CYCLOSILICATE 10 G PO PACK
10.0000 g | PACK | Freq: Once | ORAL | Status: AC
  Administered 2023-10-15: 10 g via ORAL
  Filled 2023-10-15: qty 1

## 2023-10-15 MED ORDER — SODIUM CHLORIDE 0.9 % IV SOLN: INTRAVENOUS | Status: AC

## 2023-10-15 NOTE — Plan of Care (Signed)
  Problem: Acute Rehab PT Goals(only PT should resolve) Goal: Pt Will Go Supine/Side To Sit Flowsheets (Taken 10/15/2023 1548) Pt will go Supine/Side to Sit: with contact guard assist Goal: Patient Will Transfer Sit To/From Stand Flowsheets (Taken 10/15/2023 1548) Patient will transfer sit to/from stand:  with supervision  with contact guard assist Goal: Pt Will Transfer Bed To Chair/Chair To Bed Flowsheets (Taken 10/15/2023 1548) Pt will Transfer Bed to Chair/Chair to Bed:  with supervision  with contact guard assist Goal: Pt Will Ambulate Flowsheets (Taken 10/15/2023 1548) Pt will Ambulate:  50 feet  with rolling walker  with supervision  with contact guard assist    [name] SPT

## 2023-10-15 NOTE — Progress Notes (Signed)
Mobility Specialist Progress Note:    10/15/23 0955  Mobility  Activity Ambulated with assistance to bathroom  Level of Assistance Standby assist, set-up cues, supervision of patient - no hands on  Assistive Device Front wheel walker  Distance Ambulated (ft) 20 ft  Range of Motion/Exercises Active;All extremities  Activity Response Tolerated well  Mobility Referral Yes  Mobility visit 1 Mobility  Mobility Specialist Start Time (ACUTE ONLY) 0955  Mobility Specialist Stop Time (ACUTE ONLY) 1025  Mobility Specialist Time Calculation (min) (ACUTE ONLY) 30 min   Pt received in chair, agreeable to mobility. Required SBA to stand and ambulate. Tolerated well, asx throughout. Returned to chair, call bell in reach. All needs met.  Sherrill Buikema Mobility Specialist Please contact via Special educational needs teacher or  Rehab office at [phone]

## 2023-10-15 NOTE — Progress Notes (Signed)
Progress Note   Patient: [name]:[id number] DOB: 1931-10-18 DOA: 10/14/2023     0 DOS: the patient was seen and examined on 10/15/2023   Brief hospital admission course: [name] is a 89 y.o. female with medical history significant of hyperlipidemia, hypertension, type 2 diabetes mellitus with nephropathy, chronic kidney disease stage III a, GERD, depression/anxiety, paroxysmal atrial fibrillation (chronically on Eliquis) and adrenal gland cyst; who presented from the skilled nursing facility after experiencing an unwitnessed fall. No acute fracture appreciated on workup in the ED; patient with significant right flank hematoma (recommended for observation and holding anticoagulation) as per trauma surgeon.   Patient was also noticed to be hyperkalemic and with acute kidney injury.   Gentle fluid resuscitation has been initiated; patient received Lokelma, bicarbonate and calcium gluconate.  TRH contacted to place in the hospital for further evaluation and management.  Assessment and Plan: 1-acute kidney injury on chronic renal failure stage IIIa -Renal function after fluid resuscitation has started to improve but not back to baseline yet -Continue to follow trend -Continue minimizing nephrotoxic agents maintain adequate hydration.  2-flank hematoma -Will continue holding Eliquis -No significant enlargement currently appreciated, but patient hemoglobin dropped some more. -Hemodilution from fluid resuscitation probably accountable for some of her reduction. -Continue to follow hemoglobin trend. -Transfuse for hemoglobin less than 7.  3-history of CVA -No new focal deficit appreciated -Continue Refacto modifications  4-mechanical fall/physical deconditioning -Physical therapy assessment has been requested -Will follow recommendations. -Continue supportive care and as needed analgesics.  5-paroxysmal atrial fibrillation -Rate controlled; patient reports no  palpitations or fluttering. -Continue holding anticoagulation acutely as per hematoma process -Continue telemetry monitoring.  6-adrenal gland -Repeat CT scan in 6 to 46-month as recommended by radiology -There has been some changes in patient's adrenal cyst size.  7-osteoarthritis -Continue supportive care -Continue as needed analgesics.  8-hypertension -Continue current antihypertensive agents.  9-class II obesity -Body mass index is 34.44 kg/m.  -Low-calorie diet and portion control discussed with patient.  10-hyperkalemia -At time of admission patient received calcium gluconate, insulin, bicarbonate and Lokelma -After fluid resuscitation patient's potassium down to 5.5 -Will repeat Lokelma again and continue telemetry monitoring -Continue to follow electrolytes trend.  Subjective:  Patient reporting some flank pain; no chest pain, no palpitations, no shortness of breath, no nausea or vomiting.  Physical Exam: Vitals:   10/15/23 0459 10/15/23 0856 10/15/23 1328 10/15/23 1548  BP: (!) 147/54 (!) 156/62 137/62 (!) 178/75  Pulse: 86 (!) 105 97   Resp: 17  17   Temp: 98.7 F (37.1 C)  98.1 F (36.7 C)   TempSrc: Oral  Oral   SpO2: 96% 97% 100%   Weight:      Height:       General exam: Alert, awake, following commands appropriately and in no acute distress. Respiratory system: Clear to auscultation. Respiratory effort normal.  Good saturation on room air. Cardiovascular system: Rate controlled; no rubs, no gallops, no JVD. Gastrointestinal system: Abdomen is obese, nondistended, soft and nontender. No organomegaly or masses felt. Normal bowel sounds heard. Central nervous system: No new focal neurological deficits. Extremities: No cyanosis or clubbing; 1+ edema appreciated bilaterally (chronic and unchanged). Skin: No petechiae; big bruises/hematoma in her right flank.  Refer to epic media for illustration. Psychiatry: Mood & affect appropriate.   Data  Reviewed: CBC: WBC 6.3, hemoglobin 8.3 and platelet count 202K Basic metabolic panel: Sodium 139, potassium 5.5, chloride 111, bicarb 21, BUN 36, creatinine 1.2 and GFR  40  Family Communication: No family at bedside.  Disposition: Status is: Inpatient Remains inpatient appropriate because: Continue IV therapy; follow renal function trend and hemoglobin stability.   Planned Discharge Destination: Skilled nursing facility long-term resident.  Time spent: 50 minutes  Author: Vassie Loll, MD 10/15/2023 4:34 PM  For on call review [email or web address].

## 2023-10-15 NOTE — Care Management Obs Status (Signed)
MEDICARE OBSERVATION STATUS NOTIFICATION   Patient Details  Name: [name] MRN: [id number] Date of Birth: [birth year]   Medicare Observation Status Notification Given:  Yes Pearletha Furl., CMA, verbally reviewed observation notice with Abelino Derrick. Copy provided.)    Corey Harold 10/15/2023, 11:06 AM

## 2023-10-15 NOTE — Plan of Care (Signed)
   Problem: Health Behavior/Discharge Planning: Goal: Ability to manage health-related needs will improve Outcome: Progressing   Problem: Clinical Measurements: Goal: Will remain free from infection Outcome: Progressing Goal: Diagnostic test results will improve Outcome: Progressing

## 2023-10-15 NOTE — TOC Initial Note (Signed)
Transition of Care [facility]) - Initial/Assessment Note    Patient Details  Name: [name] MRN: [id number] Date of Birth: [birth year]  Transition of [facility] CM/SW Contact:    Villa Herb, LCSWA Phone Number: 10/15/2023, 12:51 PM  Clinical Narrative:                 [facility] consulted for SNF placement. CSW noted per chart review that pt arrived from [facility]. CSW spoke to pts son who states that plan will be for pt to return to SNF at [facility]. CSW spoke to [facility] in admissions who requested PT eval and auth be started. CSW reached out to MD to request PT eval be ordered. TOC to follow.   Expected Discharge Plan: Skilled Nursing Facility Barriers to Discharge: Continued Medical Work up   Patient Goals and CMS Choice Patient states their goals for this hospitalization and ongoing recovery are:: return to facility CMS Medicare.gov Compare Post Acute Care list provided to:: Patient Choice offered to / list presented to : Patient      Expected Discharge Plan and Services In-house Referral: Clinical Social Work Discharge Planning Services: CM Consult   Living arrangements for the past 2 months: Skilled Nursing Facility                                      Prior Living Arrangements/Services Living arrangements for the past 2 months: Skilled Nursing Facility Lives with:: Facility Resident Patient language and need for interpreter reviewed:: Yes Do you feel safe going back to the place where you live?: Yes      Need for Family Participation in Patient Care: Yes (Comment) Care giver support system in place?: Yes (comment)   Criminal Activity/Legal Involvement Pertinent to Current Situation/Hospitalization: No - Comment as needed  Activities of Daily Living   ADL Screening (condition at time of admission) Independently performs ADLs?: No Does the patient have a NEW difficulty with bathing/dressing/toileting/self-feeding that is expected to last >3 days?:  No Does the patient have a NEW difficulty with getting in/out of bed, walking, or climbing stairs that is expected to last >3 days?: No Does the patient have a NEW difficulty with communication that is expected to last >3 days?: No Is the patient deaf or have difficulty hearing?: No Does the patient have difficulty seeing, even when wearing glasses/contacts?: No Does the patient have difficulty concentrating, remembering, or making decisions?: No  Permission Sought/Granted                  Emotional Assessment Appearance:: Appears stated age       Alcohol / Substance Use: Not Applicable Psych Involvement: No (comment)  Admission diagnosis:  Hyperkalemia [E87.5] AKI (acute kidney injury) (HCC) [N17.9] Acute kidney injury (HCC) [N17.9] Contusion of flank, initial encounter [S30.1XXA] Patient Active Problem List   Diagnosis Date Noted   AKI (acute kidney injury) (HCC) 10/14/2023   Generalized weakness 03/05/2023   Pressure injury of skin 03/05/2023   Acute on chronic renal failure (HCC) 02/28/2023   OA (osteoarthritis) 02/28/2023   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   CVA (cerebral vascular accident) (HCC) 02/28/2023   Mixed hyperlipidemia 02/28/2023   Periprosthetic fracture of patella 09/21/2022   Peri-prosthetic patellar fracture 09/19/2022   S/P total knee arthroplasty, left 09/03/2022   Degenerative arthritis of left knee 01/18/2021   S/P reverse total shoulder arthroplasty, 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 [place] [address] Phone: [phone] Fax: [phone]  [facility] - [place], [place] - [address] [place] [address] Phone: [phone] Fax: [phone]  [facility] - [place], [place] - [address] [place] [address] Phone: [phone] Fax: [phone]     Social Drivers of Health (SDOH) Social History: SDOH Screenings   Food Insecurity: No Food Insecurity (10/14/2023)  Housing: Low Risk  (10/14/2023)  Transportation Needs: No Transportation Needs (10/14/2023)  Utilities: Not At Risk (10/14/2023)  Financial Resource Strain: Low Risk  (03/28/2023)   Received from [facility]  Physical Activity: Insufficiently Active (02/05/2023)   [facility] from [facility], [facility]  Social Connections: Patient Declined (10/14/2023)  Stress: Stress Concern Present (02/05/2023)   Received from [facility], [facility]  Tobacco Use: Medium Risk (10/14/2023)   SDOH Interventions:     Readmission Risk Interventions    09/25/2022    9:57 AM  Readmission Risk Prevention Plan  Post Dischage Appt Complete  Medication Screening Complete  Transportation Screening Complete

## 2023-10-15 NOTE — Progress Notes (Signed)
Patient family in room visiting and patient became upset discussing daughter that has recently passed. Patient asked for something to help with her nerves. PRN Xanax administered. Family still at bedside and patient has calmed down and eating snack.

## 2023-10-15 NOTE — Plan of Care (Signed)
  Problem: Health Behavior/Discharge Planning: Goal: Ability to manage health-related needs will improve Outcome: Adequate for Discharge   Problem: Clinical Measurements: Goal: Diagnostic test results will improve Outcome: Adequate for Discharge   

## 2023-10-15 NOTE — Evaluation (Signed)
Physical Therapy Evaluation Patient Details Name: [name] MRN: [id number] DOB: [birth year] Today's Date: 10/15/2023  History of Present Illness  [name] is a 89 y.o. female with medical history significant of hyperlipidemia, hypertension, type 2 diabetes mellitus with nephropathy, chronic kidney disease stage III a, GERD, depression/anxiety, paroxysmal atrial fibrillation (chronically on Eliquis) and adrenal gland cyst; who presented from the skilled nursing facility after experiencing an unwitnessed fall.  No acute fracture appreciated on workup in the ED; patient with significant right flank hematoma (recommended for observation and holding anticoagulation) as per trauma surgeon. Patient was also noticed to be hyperkalemic and with acute kidney injury. Gentle fluid resuscitation has been initiated; patient received Lokelma, bicarbonate and calcium gluconate. TRH contacted to place in the hospital for further evaluation and management.   Clinical Impression  Patient was agreeable to therapy. Patient performed bed mobility and transfers with CTG/min assist. Patient used RW and CTG to ambulate in room. Patient did require min assist for initial boost to perform sit to stand. Patient tolerated treatment well. Patient will benefit from continued skilled physical therapy in hospital and recommended venue below to increase strength, balance, endurance for safe ADLs and gait.        If plan is discharge home, recommend the following: A little help with walking and/or transfers;A little help with bathing/dressing/bathroom;Help with stairs or ramp for entrance;Assistance with cooking/housework   Can travel by private vehicle        Equipment Recommendations None recommended by PT  Recommendations for Other Services       Functional Status Assessment Patient has had a recent decline in their functional status and demonstrates the ability to make significant improvements in function in a  reasonable and predictable amount of time.     Precautions / Restrictions Precautions Precautions: Fall Restrictions Weight Bearing Restrictions Per Provider Order: No      Mobility  Bed Mobility Overal bed mobility: Needs Assistance Bed Mobility: Supine to Sit, Sit to Supine     Supine to sit: Min assist, Contact guard Sit to supine: Contact guard assist, Min assist   General bed mobility comments: slow labored movements Patient Response: Cooperative  Transfers Overall transfer level: Needs assistance Equipment used: Rolling walker (2 wheels) Transfers: Sit to/from Stand, Bed to chair/wheelchair/BSC Sit to Stand: Contact guard assist, Min assist Stand pivot transfers: Contact guard assist, Min assist              Ambulation/Gait Ambulation/Gait assistance: Contact guard assist, Min assist Gait Distance (Feet): 25 Feet Assistive device: Rolling walker (2 wheels) Gait Pattern/deviations: Step-to pattern, Decreased step length - right, Decreased step length - left, Decreased stride length Gait velocity: slow     General Gait Details: unsteady, slow movement  Stairs            Wheelchair Mobility     Tilt Bed Tilt Bed Patient Response: Cooperative  Modified Rankin (Stroke Patients Only)       Balance Overall balance assessment: Needs assistance Sitting-balance support: Feet supported, Bilateral upper extremity supported Sitting balance-Leahy Scale: Fair Sitting balance - Comments: able to maintain balance EOB, fair/good   Standing balance support: Bilateral upper extremity supported, During functional activity Standing balance-Leahy Scale: Fair Standing balance comment: fair/good with use of RW                             Pertinent Vitals/Pain Pain Assessment Pain Assessment: No/denies pain  Home Living Family/patient expects to be discharged to:: Skilled nursing facility                        Prior Function Prior  Level of Function : Needs assist             Mobility Comments: patient states household ambulation with RW ADLs Comments: adls assisted by SNF staff     Extremity/Trunk Assessment        Lower Extremity Assessment Lower Extremity Assessment: Generalized weakness       Communication   Communication Communication: No apparent difficulties Cueing Techniques: Verbal cues;Tactile cues  Cognition Arousal: Alert Behavior During Therapy: WFL for tasks assessed/performed Overall Cognitive Status: Within Functional Limits for tasks assessed                                          General Comments      Exercises     Assessment/Plan    PT Assessment Patient needs continued PT services  PT Problem List Decreased strength;Decreased range of motion;Decreased activity tolerance;Decreased balance;Decreased mobility;Decreased coordination       PT Treatment Interventions DME instruction;Gait training;Stair training;Patient/family education;Functional mobility training;Therapeutic activities;Therapeutic exercise;Balance training    PT Goals (Current goals can be found in the Care Plan section)  Acute Rehab PT Goals Patient Stated Goal: To return home PT Goal Formulation: With patient Time For Goal Achievement: 10/29/23 Potential to Achieve Goals: Good    Frequency Min 3X/week     Co-evaluation               AM-PAC PT "6 Clicks" Mobility  Outcome Measure Help needed turning from your back to your side while in a flat bed without using bedrails?: A Little Help needed moving from lying on your back to sitting on the side of a flat bed without using bedrails?: A Little Help needed moving to and from a bed to a chair (including a wheelchair)?: A Little Help needed standing up from a chair using your arms (e.g., wheelchair or bedside chair)?: A Little Help needed to walk in hospital room?: A Little Help needed climbing 3-5 steps with a railing? :  Total 6 Click Score: 16    End of Session Equipment Utilized During Treatment: Gait belt Activity Tolerance: Patient tolerated treatment well Patient left: in chair;with call bell/phone within reach;with bed alarm set Nurse Communication: Mobility status PT Visit Diagnosis: Unsteadiness on feet (R26.81);Other abnormalities of gait and mobility (R26.89);Muscle weakness (generalized) (M62.81)    Time: 9604-5409 PT Time Calculation (min) (ACUTE ONLY): 27 min   Charges:   PT Evaluation $PT Eval Moderate Complexity: 1 Mod PT Treatments $Therapeutic Activity: 23-37 mins PT General Charges $$ ACUTE PT VISIT: 1 Visit         Naomy Esham SPT

## 2023-10-16 DIAGNOSIS — N179 Acute kidney failure, unspecified: Secondary | ICD-10-CM | POA: Diagnosis not present

## 2023-10-16 LAB — RETICULOCYTES
Immature Retic Fract: 18.6 % — ABNORMAL HIGH (ref 2.3–15.9)
RBC.: 2.38 MIL/uL — ABNORMAL LOW (ref 3.87–5.11)
Retic Count, Absolute: 50 10*3/uL (ref 19.0–186.0)
Retic Ct Pct: 2.1 % (ref 0.4–3.1)

## 2023-10-16 LAB — CBC
HCT: 22.1 % — ABNORMAL LOW (ref 36.0–46.0)
Hemoglobin: 7 g/dL — ABNORMAL LOW (ref 12.0–15.0)
MCH: 29.3 pg (ref 26.0–34.0)
MCHC: 31.7 g/dL (ref 30.0–36.0)
MCV: 92.5 fL (ref 80.0–100.0)
Platelets: 156 10*3/uL (ref 150–400)
RBC: 2.39 MIL/uL — ABNORMAL LOW (ref 3.87–5.11)
RDW: 13.9 % (ref 11.5–15.5)
WBC: 4.1 10*3/uL (ref 4.0–10.5)
nRBC: 0 % (ref 0.0–0.2)

## 2023-10-16 LAB — BASIC METABOLIC PANEL
Anion gap: 6 (ref 5–15)
BUN: 25 mg/dL — ABNORMAL HIGH (ref 8–23)
CO2: 21 mmol/L — ABNORMAL LOW (ref 22–32)
Calcium: 8.5 mg/dL — ABNORMAL LOW (ref 8.9–10.3)
Chloride: 114 mmol/L — ABNORMAL HIGH (ref 98–111)
Creatinine, Ser: 1.13 mg/dL — ABNORMAL HIGH (ref 0.44–1.00)
GFR, Estimated: 47 mL/min — ABNORMAL LOW (ref >60)
Glucose, Bld: 115 mg/dL — ABNORMAL HIGH (ref 70–99)
Potassium: 4.7 mmol/L (ref 3.5–5.1)
Sodium: 141 mmol/L (ref 135–145)

## 2023-10-16 LAB — IRON AND TIBC
Iron: 22 ug/dL — ABNORMAL LOW (ref 28–170)
Saturation Ratios: 7 % — ABNORMAL LOW (ref 10.4–31.8)
TIBC: 328 ug/dL (ref 250–450)
UIBC: 306 ug/dL

## 2023-10-16 LAB — HEMOGLOBIN AND HEMATOCRIT, BLOOD
HCT: 26.3 % — ABNORMAL LOW (ref 36.0–46.0)
Hemoglobin: 8 g/dL — ABNORMAL LOW (ref 12.0–15.0)

## 2023-10-16 LAB — FERRITIN: Ferritin: 18 ng/mL (ref 11–307)

## 2023-10-16 LAB — VITAMIN B12: Vitamin B-12: 376 pg/mL (ref 180–914)

## 2023-10-16 LAB — FOLATE: Folate: >40 ng/mL (ref >5.9)

## 2023-10-16 MED ORDER — SODIUM CHLORIDE 0.9 % IV SOLN
100.0000 mg | Freq: Once | INTRAVENOUS | Status: AC
  Administered 2023-10-16: 100 mg via INTRAVENOUS
  Filled 2023-10-16: qty 5

## 2023-10-16 MED ORDER — SODIUM CHLORIDE 0.9 % IV SOLN
INTRAVENOUS | Status: AC
Start: 2023-10-16 — End: 2023-10-16

## 2023-10-16 NOTE — Plan of Care (Signed)
  Problem: Health Behavior/Discharge Planning: Goal: Ability to manage health-related needs will improve Outcome: Progressing   Problem: Clinical Measurements: Goal: Diagnostic test results will improve Outcome: Progressing   

## 2023-10-16 NOTE — Progress Notes (Signed)
   10/16/23 0803  Vitals  BP (!) 178/86  Pulse Rate (!) 106  MEWS COLOR  MEWS Score Color Green  MEWS Score  MEWS Temp 0  MEWS Systolic 0  MEWS Pulse 1  MEWS RR 0  MEWS LOC 0  MEWS Score 1   Gave patient her morning meds.

## 2023-10-16 NOTE — NC FL2 (Signed)
[place] MEDICAID FL2 LEVEL OF CARE FORM     IDENTIFICATION  Patient Name: [name] Birthdate: 1931/11/18 Sex: female Admission Date (Current Location): 10/14/2023  [facility] and [place] Number:  [employer] and Address:  [facility],  [address] [place] [address]      Provider Number: [id number]  Attending Physician Name and Address:  Erick Blinks, DO  Relative Name and Phone Number:       Current Level of Care: Hospital Recommended Level of Care: Skilled Nursing Facility Prior Approval Number:    Date Approved/Denied:   PASRR Number:    Discharge Plan: SNF    Current Diagnoses: Patient Active Problem List   Diagnosis Date Noted   AKI (acute kidney injury) (HCC) 10/14/2023   Generalized weakness 03/05/2023   Pressure injury of skin 03/05/2023   Acute on chronic renal failure (HCC) 02/28/2023   OA (osteoarthritis) 02/28/2023   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   CVA (cerebral vascular accident) (HCC) 02/28/2023   Mixed hyperlipidemia 02/28/2023   Periprosthetic fracture of patella 09/21/2022   Peri-prosthetic patellar fracture 09/19/2022   S/P total knee arthroplasty, left 09/03/2022   Degenerative arthritis of left knee 01/18/2021   S/P reverse total shoulder arthroplasty, 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   Primary osteoarthritis of right hip 01/20/2018   Osteoarthritis of right knee 01/18/2018   Chronic pain syndrome    Anxiety    Right hip pain 11/29/2017   CKD (chronic kidney disease) stage 2, GFR 60-89 ml/min 11/29/2017   Prosthetic joint infection (HCC)    Renal failure 05/27/2012   HTN (hypertension) 10/17/2011   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    Orientation RESPIRATION BLADDER Height & Weight     Place, Self  Normal Incontinent Weight: 194 lb 7.1 oz (88.2 kg) Height:  5\' 3"  (160 cm)  BEHAVIORAL SYMPTOMS/MOOD NEUROLOGICAL BOWEL NUTRITION STATUS      Continent Diet (Heart healthy)  AMBULATORY STATUS  COMMUNICATION OF NEEDS Skin   Extensive Assist Verbally Normal                       Personal Care Assistance Level of Assistance  Bathing, Feeding, Dressing Bathing Assistance: Limited assistance Feeding assistance: Independent Dressing Assistance: Limited assistance     Functional Limitations Info  Sight, Hearing, Speech Sight Info: Adequate Hearing Info: Adequate Speech Info: Adequate    SPECIAL CARE FACTORS FREQUENCY  PT (By licensed PT), OT (By licensed OT)     PT Frequency: 5 times weekly OT Frequency: 5 times weekly            Contractures Contractures Info: Not present    Additional Factors Info  Code Status, Allergies Code Status Info: DNR Allergies Info: Lisinopril, Hydrochlorothiazide, Lipitor (Atorvastatin Calcium), Thiazide-type Diuretics, Zocor (Simvastatin), Codeine           Current Medications (10/16/2023):  This is the current hospital active medication list Current Facility-Administered Medications  Medication Dose Route Frequency Provider Last Rate Last Admin   acetaminophen (TYLENOL) tablet 650 mg  650 mg Oral Q6H PRN Vassie Loll, MD   650 mg at 10/15/23 2300   Or   acetaminophen (TYLENOL) suppository 650 mg  650 mg Rectal Q6H PRN Vassie Loll, MD       ALPRAZolam Prudy Feeler) tablet 0.5 mg  0.5 mg Oral BID PRN Vassie Loll, MD   0.5 mg at 10/15/23 1951   amLODipine (NORVASC) tablet 10 mg  10 mg Oral Daily Vassie Loll,  MD   10 mg at 10/15/23 0853   busPIRone (BUSPAR) tablet 5 mg  5 mg Oral BID Vassie Loll, MD   5 mg at 10/15/23 2214   cloNIDine (CATAPRES) tablet 0.2 mg  0.2 mg Oral TID Vassie Loll, MD   0.2 mg at 10/15/23 2214   dextrose 50 % solution 50 mL  1 ampule Intravenous Once Pricilla Loveless, MD       donepezil (ARICEPT) tablet 5 mg  5 mg Oral QHS Vassie Loll, MD   5 mg at 10/15/23 2214   ezetimibe (ZETIA) tablet 10 mg  10 mg Oral Daily Vassie Loll, MD   10 mg at 10/15/23 0865   hydrALAZINE (APRESOLINE) tablet 100  mg  100 mg Oral BID Vassie Loll, MD   100 mg at 10/15/23 2213   metoprolol tartrate (LOPRESSOR) tablet 25 mg  25 mg Oral BID Vassie Loll, MD   25 mg at 10/15/23 2213   ondansetron (ZOFRAN) tablet 4 mg  4 mg Oral Q6H PRN Vassie Loll, MD       Or   ondansetron [facility]) injection 4 mg  4 mg Intravenous Q6H PRN Vassie Loll, MD       oxyCODONE (Oxy IR/ROXICODONE) immediate release tablet 5 mg  5 mg Oral Q8H PRN Vassie Loll, MD   5 mg at 10/14/23 1601   QUEtiapine (SEROQUEL) tablet 75 mg  75 mg Oral BID Vassie Loll, MD   75 mg at 10/15/23 2213     Discharge Medications: Please see discharge summary for a list of discharge medications.  Relevant Imaging Results:  Relevant Lab Results:   Additional Information SSN: [id number], [place], [place]

## 2023-10-16 NOTE — Progress Notes (Signed)
PROGRESS NOTE    [name]  FUX:[id number] DOB: 1932-06-28 DOA: 10/14/2023 PCP: Vivien Presto, MD   Brief Narrative:    [name] is a 89 y.o. female with medical history significant of hyperlipidemia, hypertension, type 2 diabetes mellitus with nephropathy, chronic kidney disease stage III a, GERD, depression/anxiety, paroxysmal atrial fibrillation (chronically on Eliquis) and adrenal gland cyst; who presented from the skilled nursing facility after experiencing an unwitnessed fall. No acute fracture appreciated on workup in the ED; patient with significant right flank hematoma (recommended for observation and holding anticoagulation) as per trauma surgeon.   Patient was also noticed to be hyperkalemic and with acute kidney injury.  Assessment & Plan:   Principal Problem:   AKI (acute kidney injury) (HCC)  Assessment and Plan:   1-acute kidney injury on chronic renal failure stage IIIa-improving -Renal function after fluid resuscitation has started to improve but not back to baseline yet -Continue to follow trend -Continue minimizing nephrotoxic agents maintain adequate hydration. -Recheck labs in a.m.   2-flank hematoma -Will continue holding Eliquis until ecchymosis has resolved and can be monitored outpatient -No significant enlargement currently appreciated, but patient hemoglobin dropped some more. -Hemodilution from fluid resuscitation probably accountable for some of her reduction. -Continue to follow hemoglobin trend. -Transfuse for hemoglobin less than 7.  Currently hemoglobin trend has remained stable -Plan to administer Venofer for iron deficiency   3-history of CVA -No new focal deficit appreciated   4-mechanical fall/physical deconditioning -Physical therapy assessment has been requested -Will follow recommendations. -Continue supportive care and as needed analgesics.   5-paroxysmal atrial fibrillation -Rate controlled; patient reports no  palpitations or fluttering. -Continue holding anticoagulation acutely as per hematoma process -Continue telemetry monitoring.   6-adrenal gland -Repeat CT scan in 6 to 59-month as recommended by radiology -There has been some changes in patient's adrenal cyst size.   7-osteoarthritis -Continue supportive care -Continue as needed analgesics.   8-hypertension -Continue current antihypertensive agents.   9-class II obesity -Body mass index is 34.44 kg/m.  -Low-calorie diet and portion control discussed with patient.   10-hyperkalemia -At time of admission patient received calcium gluconate, insulin, bicarbonate and Lokelma -After fluid resuscitation patient's potassium down to 5.5 -Will repeat Lokelma again and continue telemetry monitoring -Continue to follow electrolytes trend.    DVT prophylaxis: SCDs Code Status: DNR Family Communication: None at bedside Disposition Plan:  Status is: Inpatient Remains inpatient appropriate because: For IV medications.   Consultants:  None  Procedures:  None  Antimicrobials:  None  Subjective: Patient seen and evaluated today with no new acute complaints or concerns. No acute concerns or events noted overnight.  Denies any pain at this time.  Objective: Vitals:   10/16/23 0419 10/16/23 0803 10/16/23 1058 10/16/23 1326  BP: (!) 157/61 (!) 178/86 (!) 133/53 (!) 138/58  Pulse: 83 (!) 106 89 83  Resp: 18   17  Temp: 98.5 F (36.9 C)   98.4 F (36.9 C)  TempSrc: Oral   Oral  SpO2: 99%  96% 98%  Weight:      Height:        Intake/Output Summary (Last 24 hours) at 10/16/2023 1602 Last data filed at 10/16/2023 1500 Gross per 24 hour  Intake 1937.08 ml  Output 700 ml  Net 1237.08 ml   Filed Weights   10/14/23 0515  Weight: 88.2 kg    Examination:  General exam: Appears calm and comfortable  Respiratory system: Clear to auscultation. Respiratory effort normal. Cardiovascular  system: S1 & S2 heard, RRR.   Gastrointestinal system: Abdomen is soft Central nervous system: Alert and awake Extremities: No edema Skin: No significant lesions noted Psychiatry: Flat affect.    Data Reviewed: I have personally reviewed following labs and imaging studies  CBC: Recent Labs  Lab 10/14/23 0759 10/14/23 1212 10/15/23 0336 10/16/23 0357 10/16/23 1018  WBC 8.0 7.9 6.3 4.1  --   NEUTROABS 5.8 5.9  --   --   --   HGB 10.1* 9.0* 8.3* 7.0* 8.0*  HCT 32.7* 29.7* 26.9* 22.1* 26.3*  MCV 92.9 94.0 94.7 92.5  --   PLT 218 198 202 156  --    Basic Metabolic Panel: Recent Labs  Lab 10/14/23 0759 10/14/23 1212 10/15/23 0336 10/16/23 0357  NA 137 137 139 141  K 6.2* 5.9* 5.5* 4.7  CL 112* 109 111 114*  CO2 19* 21* 21* 21*  GLUCOSE 127* 127* 116* 115*  BUN 41* 38* 36* 25*  CREATININE 1.38* 1.26* 1.27* 1.13*  CALCIUM 8.8* 8.7* 8.6* 8.5*   GFR: Estimated Creatinine Clearance: 35.5 mL/min (A) (by C-G formula based on SCr of 1.13 mg/dL (H)). Liver Function Tests: No results for input(s): "AST", "ALT", "ALKPHOS", "BILITOT", "PROT", "ALBUMIN" in the last 168 hours. No results for input(s): "LIPASE", "AMYLASE" in the last 168 hours. No results for input(s): "AMMONIA" in the last 168 hours. Coagulation Profile: No results for input(s): "INR", "PROTIME" in the last 168 hours. Cardiac Enzymes: No results for input(s): "CKTOTAL", "CKMB", "CKMBINDEX", "TROPONINI" in the last 168 hours. BNP (last 3 results) No results for input(s): "PROBNP" in the last 8760 hours. HbA1C: No results for input(s): "HGBA1C" in the last 72 hours. CBG: Recent Labs  Lab 10/14/23 1042  GLUCAP 87   Lipid Profile: No results for input(s): "CHOL", "HDL", "LDLCALC", "TRIG", "CHOLHDL", "LDLDIRECT" in the last 72 hours. Thyroid Function Tests: No results for input(s): "TSH", "T4TOTAL", "FREET4", "T3FREE", "THYROIDAB" in the last 72 hours. Anemia Panel: Recent Labs    10/16/23 0357  VITAMINB12 376  FOLATE >40.0   FERRITIN 18  TIBC 328  IRON 22*  RETICCTPCT 2.1   Sepsis Labs: No results for input(s): "PROCALCITON", "LATICACIDVEN" in the last 168 hours.  No results found for this or any previous visit (from the past 240 hours).       Radiology Studies: No results found.      Scheduled Meds:  amLODipine  10 mg Oral Daily   busPIRone  5 mg Oral BID   cloNIDine  0.2 mg Oral TID   dextrose  1 ampule Intravenous Once   donepezil  5 mg Oral QHS   ezetimibe  10 mg Oral Daily   hydrALAZINE  100 mg Oral BID   metoprolol tartrate  25 mg Oral BID   QUEtiapine  75 mg Oral BID   Continuous Infusions:  sodium chloride 75 mL/hr at 10/16/23 1318   iron sucrose       LOS: 1 day    Time spent: 35 minutes    Jamisha Hoeschen Hoover Brunette, DO [facility]  If 7PM-7AM, please contact night-coverage [email or web address] 10/16/2023, 4:02 PM

## 2023-10-16 NOTE — Progress Notes (Signed)
Mobility Specialist Progress Note:    10/16/23 1430  Mobility  Activity Refused mobility   Pt politely refused mobility, had just been returned to bed and wanted to nap. All needs met.   Lawerance Bach Mobility Specialist Please contact via Special educational needs teacher or  Rehab office at [phone]

## 2023-10-17 DIAGNOSIS — N179 Acute kidney failure, unspecified: Secondary | ICD-10-CM | POA: Diagnosis not present

## 2023-10-17 LAB — CBC
HCT: 24.8 % — ABNORMAL LOW (ref 36.0–46.0)
Hemoglobin: 7.4 g/dL — ABNORMAL LOW (ref 12.0–15.0)
MCH: 28.2 pg (ref 26.0–34.0)
MCHC: 29.8 g/dL — ABNORMAL LOW (ref 30.0–36.0)
MCV: 94.7 fL (ref 80.0–100.0)
Platelets: 175 10*3/uL (ref 150–400)
RBC: 2.62 MIL/uL — ABNORMAL LOW (ref 3.87–5.11)
RDW: 13.9 % (ref 11.5–15.5)
WBC: 4.7 10*3/uL (ref 4.0–10.5)
nRBC: 0 % (ref 0.0–0.2)

## 2023-10-17 LAB — BASIC METABOLIC PANEL
Anion gap: 6 (ref 5–15)
BUN: 21 mg/dL (ref 8–23)
CO2: 22 mmol/L (ref 22–32)
Calcium: 8.6 mg/dL — ABNORMAL LOW (ref 8.9–10.3)
Chloride: 114 mmol/L — ABNORMAL HIGH (ref 98–111)
Creatinine, Ser: 1.03 mg/dL — ABNORMAL HIGH (ref 0.44–1.00)
GFR, Estimated: 52 mL/min — ABNORMAL LOW (ref >60)
Glucose, Bld: 113 mg/dL — ABNORMAL HIGH (ref 70–99)
Potassium: 4.7 mmol/L (ref 3.5–5.1)
Sodium: 142 mmol/L (ref 135–145)

## 2023-10-17 LAB — MAGNESIUM: Magnesium: 1.9 mg/dL (ref 1.7–2.4)

## 2023-10-17 MED ORDER — METOPROLOL TARTRATE 25 MG PO TABS: 25.0000 mg | ORAL_TABLET | Freq: Two times a day (BID) | ORAL | 2 refills | Status: AC

## 2023-10-17 MED ORDER — BUSPIRONE HCL 5 MG PO TABS: 5.0000 mg | ORAL_TABLET | Freq: Two times a day (BID) | ORAL | 0 refills | Status: AC

## 2023-10-17 MED ORDER — HYDRALAZINE HCL 25 MG PO TABS
100.0000 mg | ORAL_TABLET | Freq: Three times a day (TID) | ORAL | Status: DC
  Administered 2023-10-17: 100 mg via ORAL
  Filled 2023-10-17: qty 4

## 2023-10-17 MED ORDER — ALPRAZOLAM 0.5 MG PO TABS: 0.5000 mg | ORAL_TABLET | Freq: Two times a day (BID) | ORAL | 0 refills | Status: DC | PRN

## 2023-10-17 MED ORDER — OXYCODONE HCL 5 MG PO TABS: 5.0000 mg | ORAL_TABLET | Freq: Three times a day (TID) | ORAL | 0 refills | Status: DC | PRN

## 2023-10-17 MED ORDER — HYDRALAZINE HCL 100 MG PO TABS: 100.0000 mg | ORAL_TABLET | Freq: Three times a day (TID) | ORAL | 2 refills | Status: DC

## 2023-10-17 NOTE — TOC Transition Note (Signed)
Transition of Care [facility]) - Discharge Note   Patient Details  Name: [name] MRN: [id number] Date of Birth: 11-19-1931  [facility] CM/SW Contact:  Elliot Gault, LCSW Phone Number: 10/17/2023, 10:46 AM   Clinical Narrative:     Pt medically stable for dc today per MD. Updated Whitney Post at [facility] and they can re-admit pt today.  Updated pt's son, Thereasa Distance, of dc and he remains in agreement with dc plan.  DC clinical sent electronically. RN to call report. Transportation arranged with [facility].  No other TOC needs for dc.  Final next level of care: Skilled Nursing Facility Barriers to Discharge: Barriers Resolved   Patient Goals and CMS Choice Patient states their goals for this hospitalization and ongoing recovery are:: return to facility CMS Medicare.gov Compare Post Acute Care list provided to:: Patient Choice offered to / list presented to : Patient      Discharge Placement                  Name of family member notified: Loura Back Patient and family notified of of transfer: 10/17/23  Discharge Plan and Services Additional resources added to the After Visit Summary for   In-house Referral: Clinical Social Work Discharge Planning Services: CM Consult                                 Social Drivers of Health (SDOH) Interventions SDOH Screenings   Food Insecurity: No Food Insecurity (10/14/2023)  Housing: Low Risk  (10/14/2023)  Transportation Needs: No Transportation Needs (10/14/2023)  Utilities: Not At Risk (10/14/2023)  Financial Resource Strain: Low Risk  (03/28/2023)   Received from [facility], [facility]  Physical Activity: Insufficiently Active (02/05/2023)   Received from [facility], [facility]  Social Connections: Patient Declined (10/14/2023)  Stress: Stress Concern Present (02/05/2023)   Received from [facility], [facility]  Tobacco Use: Medium Risk (10/14/2023)      Readmission Risk Interventions    09/25/2022    9:57 AM  Readmission Risk Prevention Plan  Post Dischage Appt Complete  Medication Screening Complete  Transportation Screening Complete

## 2023-10-17 NOTE — Plan of Care (Signed)
  Problem: Health Behavior/Discharge Planning: Goal: Ability to manage health-related needs will improve Outcome: Progressing   Problem: Clinical Measurements: Goal: Will remain free from infection Outcome: Progressing   

## 2023-10-17 NOTE — Discharge Summary (Signed)
Physician Discharge Summary  [name] NFA:[id number] DOB: May 27, 1932 DOA: 10/14/2023  PCP: Vivien Presto, MD  Admit date: 10/14/2023  Discharge date: 10/17/2023  Admitted From:Home  Disposition:  SNF  Recommendations for Outpatient Follow-up:  Follow up with PCP in 1-2 weeks Continue to hold Eliquis until ecchymosis has resolved Follow-up hemoglobin and hematocrit in [redacted] week along with BMP Follow-up CT abdomen to evaluate adrenal cyst in 6-12 months as recommended per radiology Continue blood pressure medications as noted below  Home Health: None  Equipment/Devices: None  Discharge Condition:Stable  CODE STATUS: DNR  Diet recommendation: Heart Healthy/carb modified  Brief/Interim Summary: [name] is a 89 y.o. female with medical history significant of hyperlipidemia, hypertension, type 2 diabetes mellitus with nephropathy, chronic kidney disease stage III a, GERD, depression/anxiety, paroxysmal atrial fibrillation (chronically on Eliquis) and adrenal gland cyst; who presented from the skilled nursing facility after experiencing an unwitnessed fall. No acute fracture appreciated on workup in the ED; patient with significant right flank hematoma (recommended for observation and holding anticoagulation) as per trauma surgeon.   Patient was also noticed to be hyperkalemic and with acute kidney injury.  AKI is resolving and hyperkalemia has resolved.  Her hemoglobin levels continue to remain stable and she has been recommended to remain off of Eliquis until ecchymosis resolves per general surgery.  Blood pressure medications have been adjusted as well as noted below.  No other acute events or concerns noted.  PT recommending SNF/rehab and patient is stable for transfer today.  Discharge Diagnoses:  Principal Problem:   AKI (acute kidney injury) (HCC)  Principal discharge diagnosis: AKI on CKD stage IIIa along with flank hematoma in the setting of Eliquis use along with  fall.  Discharge Instructions  Discharge Instructions     Diet - low sodium heart healthy   Complete by: As directed    Increase activity slowly   Complete by: As directed       Allergies as of 10/17/2023       Reactions   Lisinopril Swelling   angioedema   Hydrochlorothiazide Other (See Comments)   UNSPECIFIED REACTION    Lipitor [atorvastatin Calcium] Other (See Comments)   UNSPECIFIED REACTION    Thiazide-type Diuretics Other (See Comments)   UNSPECIFIED REACTION    Zocor [simvastatin] Swelling   SWELLING REACTION UNSPECIFIED    Codeine Nausea Only        Medication List     STOP taking these medications    aspirin EC 81 MG tablet   Eliquis 5 MG Tabs tablet Generic drug: apixaban   losartan 50 MG tablet Commonly known as: COZAAR       TAKE these medications    acetaminophen 325 MG tablet Commonly known as: TYLENOL Take 650 mg by mouth every 6 (six) hours as needed.   ALPRAZolam 0.5 MG tablet Commonly known as: XANAX Take 1 tablet (0.5 mg total) by mouth 2 (two) times daily as needed for anxiety. What changed:  when to take this reasons to take this   amLODipine 10 MG tablet Commonly known as: NORVASC Take 10 mg by mouth daily.   busPIRone 5 MG tablet Commonly known as: BUSPAR Take 1 tablet (5 mg total) by mouth 2 (two) times daily.   Cholecalciferol 50 MCG (2000 UT) Caps Take 2,000 Units by mouth daily at 6 (six) AM.   cloNIDine 0.2 MG tablet Commonly known as: CATAPRES Take 1 tablet (0.2 mg total) by mouth 3 (three) times daily.  donepezil 5 MG tablet Commonly known as: ARICEPT Take 5 mg by mouth at bedtime.   ezetimibe 10 MG tablet Commonly known as: ZETIA Take 10 mg by mouth daily.   gabapentin 300 MG capsule Commonly known as: NEURONTIN Take 1 capsule (300 mg total) by mouth 3 (three) times daily. What changed: when to take this   hydrALAZINE 100 MG tablet Commonly known as: APRESOLINE Take 1 tablet (100 mg total) by  mouth every 8 (eight) hours. What changed:  medication strength how much to take   metoprolol tartrate 25 MG tablet Commonly known as: LOPRESSOR Take 1 tablet (25 mg total) by mouth 2 (two) times daily. What changed:  medication strength how much to take   Multivitamin Womens 50+ Adv Tabs Take 1 tablet by mouth daily.   oxyCODONE 5 MG immediate release tablet Commonly known as: Roxicodone Take 1 tablet (5 mg total) by mouth every 8 (eight) hours as needed for severe pain (pain score 7-10) or breakthrough pain. Take 1 tablet by mouth every 4-6 hours as needed for post op pain What changed: reasons to take this   QUEtiapine 25 MG tablet Commonly known as: SEROQUEL Take 75 mg by mouth See admin instructions. Take 75 mg in the morning and 75 mg at bedtime   saccharomyces boulardii 250 MG capsule Commonly known as: FLORASTOR Take 250 mg by mouth daily at 6 (six) AM.   SERTRALINE HCL PO Take 75 mg by mouth in the morning.   SUPER B COMPLEX PO Take 1 tablet by mouth daily.        Follow-up Information     Corrington, Kip A, MD. Schedule an appointment as soon as possible for a visit in 1 week(s).   Specialty: Family Medicine Contact information: [address] [place] [address] [phone]                Allergies  Allergen Reactions   Lisinopril Swelling    angioedema   Hydrochlorothiazide Other (See Comments)    UNSPECIFIED REACTION    Lipitor [Atorvastatin Calcium] Other (See Comments)    UNSPECIFIED REACTION    Thiazide-Type Diuretics Other (See Comments)    UNSPECIFIED REACTION    Zocor [Simvastatin] Swelling    SWELLING REACTION UNSPECIFIED    Codeine Nausea Only    Consultations: Trauma surgery   Procedures/Studies: CT ABDOMEN PELVIS WO CONTRAST Result Date: 10/14/2023 CLINICAL DATA:  Unwitnessed fall.  Right flank hematoma. EXAM: CT ABDOMEN AND PELVIS WITHOUT CONTRAST TECHNIQUE: Multidetector CT imaging of the abdomen and pelvis  was performed following the standard protocol without IV contrast. RADIATION DOSE REDUCTION: This exam was performed according to the departmental dose-optimization program which includes automated exposure control, adjustment of the mA and/or kV according to patient size and/or use of iterative reconstruction technique. COMPARISON:  02/28/2023 FINDINGS: Lower chest: Calcified granuloma again noted right lung base. Hepatobiliary: No suspicious focal abnormality in the liver on this study without intravenous contrast. Gallbladder surgically absent. No intrahepatic or extrahepatic biliary dilation. Pancreas: Diffusely atrophic without main duct dilatation. Spleen: No splenomegaly. No suspicious focal mass lesion. Adrenals/Urinary Tract: No adrenal nodule or mass. Right kidney unremarkable without hydronephrosis. 3.9 cm exophytic lesion lower pole left kidney is similar to prior to approaches water density suggesting renal cyst as etiology. 1.3 cm exophytic lesion anterior lower pole left kidney has attenuation too high to be a simple cyst in has increased from 10 mm previously. While this may be a cyst complicated by proteinaceous  debris or hemorrhage, renal neoplasm could have this appearance. Possible trace peripelvic edema in the left kidney. No evidence for hydroureter. Bladder is nondistended and largely obscured by beam hardening artifact from bilateral hip replacement. Stomach/Bowel: Stomach is unremarkable. No gastric wall thickening. No evidence of outlet obstruction. Duodenum is normally positioned as is the ligament of Treitz. No small bowel wall thickening. No small bowel dilatation. The terminal ileum is normal. The appendix is not well visualized, but there is no edema or inflammation in the region of the cecal tip to suggest appendicitis. No gross colonic mass. No colonic wall thickening. Vascular/Lymphatic: There is moderate atherosclerotic calcification of the abdominal aorta without aneurysm. There  is no gastrohepatic or hepatoduodenal ligament lymphadenopathy. No retroperitoneal or mesenteric lymphadenopathy. No pelvic sidewall lymphadenopathy. Reproductive: No left adnexal mass. 6.6 cm posterior right adnexal cystic lesion has increased slightly in size from 6.1 cm previously when measured in a similar fashion on today's study. Other: No intraperitoneal free fluid. Musculoskeletal: Bilateral hip replacement. Bones are diffusely demineralized subcutaneous edema is seen in the midline and right paramidline lower back. Midline 15.5 x 4.0 x 9.9 cm high density collection within the subcutaneous fat of the lower back suggest hematoma. IMPRESSION: 1. 15.5 x 4.0 x 9.9 cm high density collection within the subcutaneous fat of the midline and right paramidline lower back suggests hematoma. Surrounding edema/hemorrhage is noted in the subcutaneous fat of the lower back and right medial flank region. 2. 1.3 cm exophytic lesion anterior lower pole left kidney has attenuation too high to be a simple cyst and has increased from 10 mm previously. While this may be a cyst complicated by proteinaceous debris or hemorrhage, renal neoplasm could have this appearance. MRI of the abdomen with and without contrast could be used to further evaluate as clinically warranted. 3. 6.6 cm posterior right adnexal cystic lesion has increased slightly in size from 6.1 cm previously. Based on recommendations from the prior CT, follow-up pelvic ultrasound within the next 6 months recommended to further evaluate. This recommendation does not apply to premenarchal patients and to those with increased risk (genetic, family history, elevated tumor markers or other high-risk factors) of ovarian cancer. Reference: JACR 2020 Feb; 17(2):248-254. 4.  Aortic Atherosclerosis (ICD10-I70.0). Electronically Signed   By: Kennith Center M.D.   On: 10/14/2023 06:36   CT L-SPINE NO CHARGE Result Date: 10/14/2023 CLINICAL DATA:  Unwitnessed fall.  Blood  thinners. EXAM: CT Lumbar Spine without contrast TECHNIQUE: Technique: Multiplanar CT images of the lumbar spine were reconstructed from contemporary CT of the Abdomen and Pelvis. RADIATION DOSE REDUCTION: This exam was performed according to the departmental dose-optimization program which includes automated exposure control, adjustment of the mA and/or kV according to patient size and/or use of iterative reconstruction technique. CONTRAST:  None COMPARISON:  Abdominal CT 02/28/2023 FINDINGS: Segmentation: 5 lumbar type vertebrae. Alignment: Normal. Vertebrae: No acute fracture or focal pathologic process. Subjective generalized osteopenia. Paraspinal and other soft tissues: Subcutaneous high-density collection over the lower lumbar spine measuring 10 cm craniocaudal, 14 cm in transverse span, and 4 cm in thickness-consistent with hematoma in the setting. Disc levels: Diffuse degenerative facet spurring. Generalized disc space narrowing and bulging with focal advanced degeneration at L5-S1 where there is vacuum containing disc fissure which extends into the right foramen. Biforaminal impingement at L5-S1. IMPRESSION: 1. Large subcutaneous hematoma over the lower lumbar spine. 2. No acute fracture or aggressive bone lesion. Electronically Signed   By: Tiburcio Pea M.D.   On: 10/14/2023  06:24   CT HEAD WO CONTRAST ( ) Result Date: 10/14/2023 CLINICAL DATA:  Unwitnessed fall on blood thinners. Head and neck trauma. EXAM: CT HEAD WITHOUT CONTRAST CT CERVICAL SPINE WITHOUT CONTRAST TECHNIQUE: Multidetector CT imaging of the head and cervical spine was performed following the standard protocol without intravenous contrast. Multiplanar CT image reconstructions of the cervical spine were also generated. RADIATION DOSE REDUCTION: This exam was performed according to the departmental dose-optimization program which includes automated exposure control, adjustment of the mA and/or kV according to patient size and/or  use of iterative reconstruction technique. COMPARISON:  02/28/2023 FINDINGS: CT HEAD FINDINGS Brain: No evidence of acute infarction, hemorrhage, hydrocephalus, extra-axial collection or mass lesion/mass effect. 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 Generalized cerebral volume loss. Vascular: No hyperdense vessel or unexpected calcification. Skull: Negative for fracture Sinuses/Orbits: No evidence of injury CT CERVICAL SPINE FINDINGS Alignment: No traumatic malalignment. Degenerative anterolisthesis at C4-5 and C5-6. Skull base and vertebrae: No acute fracture. No primary bone lesion or focal pathologic process. Generalized osteopenia Soft tissues and spinal canal: No prevertebral fluid or swelling. No visible canal hematoma. Disc levels: Generalized degenerative endplate and facet spurring. Intervertebral ankylosis at C2-3. Upper chest: Tracheomalacia.  No acute finding. IMPRESSION: No evidence of acute intracranial or cervical spine injury. Electronically Signed   By: Tiburcio Pea M.D.   On: 10/14/2023 06:09   CT CERVICAL SPINE WO CONTRAST Result Date: 10/14/2023 CLINICAL DATA:  Unwitnessed fall on blood thinners. Head and neck trauma. EXAM: CT HEAD WITHOUT CONTRAST CT CERVICAL SPINE WITHOUT CONTRAST TECHNIQUE: Multidetector CT imaging of the head and cervical spine was performed following the standard protocol without intravenous contrast. Multiplanar CT image reconstructions of the cervical spine were also generated. RADIATION DOSE REDUCTION: This exam was performed according to the departmental dose-optimization program which includes automated exposure control, adjustment of the mA and/or kV according to patient size and/or use of iterative reconstruction technique. COMPARISON:  02/28/2023 FINDINGS: CT HEAD FINDINGS Brain: No evidence of acute infarction, hemorrhage, hydrocephalus, extra-axial collection or mass lesion/mass effect. 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  Generalized cerebral volume loss. Vascular: No hyperdense vessel or unexpected calcification. Skull: Negative for fracture Sinuses/Orbits: No evidence of injury CT CERVICAL SPINE FINDINGS Alignment: No traumatic malalignment. Degenerative anterolisthesis at C4-5 and C5-6. Skull base and vertebrae: No acute fracture. No primary bone lesion or focal pathologic process. Generalized osteopenia Soft tissues and spinal canal: No prevertebral fluid or swelling. No visible canal hematoma. Disc levels: Generalized degenerative endplate and facet spurring. Intervertebral ankylosis at C2-3. Upper chest: Tracheomalacia.  No acute finding. IMPRESSION: No evidence of acute intracranial or cervical spine injury. Electronically Signed   By: Tiburcio Pea M.D.   On: 10/14/2023 06:09     Discharge Exam: Vitals:   10/16/23 2033 10/17/23 0815  BP: (!) 179/93 (!) 152/74  Pulse: 95 86  Resp: 20 14  Temp: 98.2 F (36.8 C) 98.8 F (37.1 C)  SpO2: 95% 97%   Vitals:   10/16/23 1058 10/16/23 1326 10/16/23 2033 10/17/23 0815  BP: (!) 133/53 (!) 138/58 (!) 179/93 (!) 152/74  Pulse: 89 83 95 86  Resp:  17 20 14   Temp:  98.4 F (36.9 C) 98.2 F (36.8 C) 98.8 F (37.1 C)  TempSrc:  Oral  Oral  SpO2: 96% 98% 95% 97%  Weight:      Height:        General: Pt is alert, awake, not in acute distress Cardiovascular: RRR, S1/S2 +,  no rubs, no gallops Respiratory: CTA bilaterally, no wheezing, no rhonchi Abdominal: Soft, NT, ND, bowel sounds + Extremities: no edema, no cyanosis    The results of significant diagnostics from this hospitalization (including imaging, microbiology, ancillary and laboratory) are listed below for reference.     Microbiology: No results found for this or any previous visit (from the past 240 hours).   Labs: BNP (last 3 results) No results for input(s): "BNP" in the last 8760 hours. Basic Metabolic Panel: Recent Labs  Lab 10/14/23 0759 10/14/23 1212 10/15/23 0336 10/16/23 0357  10/17/23 0440  NA 137 137 139 141 142  K 6.2* 5.9* 5.5* 4.7 4.7  CL 112* 109 111 114* 114*  CO2 19* 21* 21* 21* 22  GLUCOSE 127* 127* 116* 115* 113*  BUN 41* 38* 36* 25* 21  CREATININE 1.38* 1.26* 1.27* 1.13* 1.03*  CALCIUM 8.8* 8.7* 8.6* 8.5* 8.6*  MG  --   --   --   --  1.9   Liver Function Tests: No results for input(s): "AST", "ALT", "ALKPHOS", "BILITOT", "PROT", "ALBUMIN" in the last 168 hours. No results for input(s): "LIPASE", "AMYLASE" in the last 168 hours. No results for input(s): "AMMONIA" in the last 168 hours. CBC: Recent Labs  Lab 10/14/23 0759 10/14/23 1212 10/15/23 0336 10/16/23 0357 10/16/23 1018 10/17/23 0440  WBC 8.0 7.9 6.3 4.1  --  4.7  NEUTROABS 5.8 5.9  --   --   --   --   HGB 10.1* 9.0* 8.3* 7.0* 8.0* 7.4*  HCT 32.7* 29.7* 26.9* 22.1* 26.3* 24.8*  MCV 92.9 94.0 94.7 92.5  --  94.7  PLT 218 198 202 156  --  175   Cardiac Enzymes: No results for input(s): "CKTOTAL", "CKMB", "CKMBINDEX", "TROPONINI" in the last 168 hours. BNP: Invalid input(s): "POCBNP" CBG: Recent Labs  Lab 10/14/23 1042  GLUCAP 87   D-Dimer No results for input(s): "DDIMER" in the last 72 hours. Hgb A1c No results for input(s): "HGBA1C" in the last 72 hours. Lipid Profile No results for input(s): "CHOL", "HDL", "LDLCALC", "TRIG", "CHOLHDL", "LDLDIRECT" in the last 72 hours. Thyroid function studies No results for input(s): "TSH", "T4TOTAL", "T3FREE", "THYROIDAB" in the last 72 hours.  Invalid input(s): "FREET3" Anemia work up Recent Labs    10/16/23 0357  VITAMINB12 376  FOLATE >40.0  FERRITIN 18  TIBC 328  IRON 22*  RETICCTPCT 2.1   Urinalysis    Component Value Date/Time   COLORURINE YELLOW 10/14/2023 1117   APPEARANCEUR CLEAR 10/14/2023 1117   LABSPEC 1.016 10/14/2023 1117   PHURINE 5.0 10/14/2023 1117   GLUCOSEU NEGATIVE 10/14/2023 1117   HGBUR NEGATIVE 10/14/2023 1117   BILIRUBINUR NEGATIVE 10/14/2023 1117   KETONESUR NEGATIVE 10/14/2023 1117    PROTEINUR NEGATIVE 10/14/2023 1117   UROBILINOGEN 0.2 09/10/2011 2155   NITRITE NEGATIVE 10/14/2023 1117   LEUKOCYTESUR NEGATIVE 10/14/2023 1117   Sepsis Labs Recent Labs  Lab 10/14/23 1212 10/15/23 0336 10/16/23 0357 10/17/23 0440  WBC 7.9 6.3 4.1 4.7   Microbiology No results found for this or any previous visit (from the past 240 hours).   Time coordinating discharge: 35 minutes  SIGNED:   Erick Blinks, DO [facility] 10/17/2023, 10:33 AM  If 7PM-7AM, please contact night-coverage [email or web address]

## 2023-10-17 NOTE — Care Management Important Message (Signed)
Important Message  Patient Details  Name: [name] MRN: [id number] Date of Birth: [birth year]   Important Message Given:  N/A - LOS <3 / Initial given by admissions     Corey Harold 10/17/2023, 10:27 AM

## 2023-10-17 NOTE — Progress Notes (Signed)
Physical Therapy Treatment Patient Details Name: [name] MRN: [id number] DOB: 11/15/1931 Today's Date: 10/17/2023   History of Present Illness [name] is a 89 y.o. female with medical history significant of hyperlipidemia, hypertension, type 2 diabetes mellitus with nephropathy, chronic kidney disease stage III a, GERD, depression/anxiety, paroxysmal atrial fibrillation (chronically on Eliquis) and adrenal gland cyst; who presented from the skilled nursing facility after experiencing an unwitnessed fall.  No acute fracture appreciated on workup in the ED; patient with significant right flank hematoma (recommended for observation and holding anticoagulation) as per trauma surgeon. Patient was also noticed to be hyperkalemic and with acute kidney injury. Gentle fluid resuscitation has been initiated; patient received Lokelma, bicarbonate and calcium gluconate. TRH contacted to place in the hospital for further evaluation and management.    PT Comments  Pt supine in bed and willing to participate.  Demonstrates slow labored movements with bed mobility, transfers and gait.  Pt required cueing for handplacement to assist with bed mobility on handrails and encouraged to push from bed vs pull on walker to assist to standing.  RW used for safety during gait, slow labored movement that was limited by fatigue.  EOS pt left in chair with call bell within reach, RW aware of status.      If plan is discharge home, recommend the following:     Can travel by private vehicle        Equipment Recommendations       Recommendations for Other Services       Precautions / Restrictions Precautions Precautions: Fall Restrictions Weight Bearing Restrictions Per Provider Order: No     Mobility  Bed Mobility Overal bed mobility: Modified Independent       Supine to sit: Min assist, Contact guard     General bed mobility comments: slow labored movements    Transfers Overall transfer  level: Modified independent Equipment used: Rolling walker (2 wheels) Transfers: Sit to/from Stand Sit to Stand: Contact guard assist, Min assist           General transfer comment: Cueing for handplacement for assistance and safety with STS    Ambulation/Gait Ambulation/Gait assistance: Contact guard assist, Min assist Gait Distance (Feet): 25 Feet Assistive device: Rolling walker (2 wheels) Gait Pattern/deviations: Step-to pattern, Decreased step length - right, Decreased step length - left, Decreased stride length       General Gait Details: unsteady, slow movement   Stairs             Wheelchair Mobility     Tilt Bed    Modified Rankin (Stroke Patients Only)       Balance                                            Cognition Arousal: Alert Behavior During Therapy: WFL for tasks assessed/performed Overall Cognitive Status: Within Functional Limits for tasks assessed                                          Exercises General Exercises - Lower Extremity Ankle Circles/Pumps: AROM, Both, 10 reps, Seated Long Arc Quad: AROM, Both, 10 reps, Seated Hip ABduction/ADduction: AROM, Both, 10 reps, Seated Hip Flexion/Marching: AROM, Both, 10 reps, Strengthening, Seated    General Comments  Pertinent Vitals/Pain Pain Assessment Pain Assessment: Faces Faces Pain Scale: Hurts a little bit Pain Location: LBP Pain Descriptors / Indicators: Sore Pain Intervention(s): Monitored during session, Repositioned, Limited activity within patient's tolerance    Home Living                          Prior Function            PT Goals (current goals can now be found in the care plan section)      Frequency           PT Plan      Co-evaluation              AM-PAC PT "6 Clicks" Mobility   Outcome Measure  Help needed turning from your back to your side while in a flat bed without using  bedrails?: A Little Help needed moving from lying on your back to sitting on the side of a flat bed without using bedrails?: A Little Help needed moving to and from a bed to a chair (including a wheelchair)?: A Little Help needed standing up from a chair using your arms (e.g., wheelchair or bedside chair)?: A Little Help needed to walk in hospital room?: A Little Help needed climbing 3-5 steps with a railing? : Total 6 Click Score: 16    End of Session Equipment Utilized During Treatment: Gait belt Activity Tolerance: Patient tolerated treatment well Patient left: in chair;with call bell/phone within reach Nurse Communication: Mobility status       Time: 0865-7846 PT Time Calculation (min) (ACUTE ONLY): 24 min  Charges:    $Therapeutic Activity: 8-22 mins PT General Charges $$ ACUTE PT VISIT: 1 Visit                     Becky Sax, LPTA/CLT; CBIS [phone]  Juel Burrow 10/17/2023, 9:45 AM

## 2023-10-17 NOTE — Plan of Care (Signed)
  Problem: Health Behavior/Discharge Planning: Goal: Ability to manage health-related needs will improve Outcome: Adequate for Discharge   Problem: Clinical Measurements: Goal: Will remain free from infection Outcome: Adequate for Discharge Goal: Diagnostic test results will improve Outcome: Adequate for Discharge   Problem: Acute Rehab PT Goals(only PT should resolve) Goal: Pt Will Go Supine/Side To Sit Outcome: Adequate for Discharge Goal: Patient Will Transfer Sit To/From Stand Outcome: Adequate for Discharge Goal: Pt Will Transfer Bed To Chair/Chair To Bed Outcome: Adequate for Discharge Goal: Pt Will Ambulate Outcome: Adequate for Discharge

## 2023-10-31 ENCOUNTER — Other Ambulatory Visit (HOSPITAL_COMMUNITY): Payer: Self-pay | Admitting: Emergency Medicine

## 2023-10-31 DIAGNOSIS — E278 Other specified disorders of adrenal gland: Secondary | ICD-10-CM

## 2023-12-09 ENCOUNTER — Ambulatory Visit (HOSPITAL_COMMUNITY)
Admission: RE | Admit: 2023-12-09 | Discharge: 2023-12-09 | Disposition: A | Discharge status: Home / Self Care | Payer: Medicare Other | Source: Ambulatory Visit | Attending: Emergency Medicine | Admitting: Emergency Medicine

## 2023-12-09 ENCOUNTER — Other Ambulatory Visit (HOSPITAL_COMMUNITY): Payer: Self-pay | Admitting: Emergency Medicine

## 2023-12-09 DIAGNOSIS — E278 Other specified disorders of adrenal gland: Secondary | ICD-10-CM | POA: Diagnosis present

## 2023-12-09 DIAGNOSIS — R935 Abnormal findings on diagnostic imaging of other abdominal regions, including retroperitoneum: Secondary | ICD-10-CM | POA: Insufficient documentation

## 2024-02-19 ENCOUNTER — Encounter (HOSPITAL_COMMUNITY): Payer: Self-pay | Admitting: *Deleted

## 2024-02-19 ENCOUNTER — Inpatient Hospital Stay (HOSPITAL_COMMUNITY)
Admission: EM | Admit: 2024-02-19 | Discharge: 2024-02-22 | DRG: 394 | Disposition: A | Discharge status: Skilled Nursing Facility | Source: Skilled Nursing Facility | Attending: Internal Medicine | Admitting: Internal Medicine

## 2024-02-19 ENCOUNTER — Emergency Department (HOSPITAL_COMMUNITY)

## 2024-02-19 ENCOUNTER — Other Ambulatory Visit: Payer: Self-pay

## 2024-02-19 DIAGNOSIS — N1832 Chronic kidney disease, stage 3b: Secondary | ICD-10-CM | POA: Diagnosis present

## 2024-02-19 DIAGNOSIS — F411 Generalized anxiety disorder: Secondary | ICD-10-CM | POA: Diagnosis present

## 2024-02-19 DIAGNOSIS — Z96653 Presence of artificial knee joint, bilateral: Secondary | ICD-10-CM | POA: Diagnosis present

## 2024-02-19 DIAGNOSIS — K648 Other hemorrhoids: Secondary | ICD-10-CM | POA: Diagnosis present

## 2024-02-19 DIAGNOSIS — Z833 Family history of diabetes mellitus: Secondary | ICD-10-CM | POA: Diagnosis not present

## 2024-02-19 DIAGNOSIS — D120 Benign neoplasm of cecum: Secondary | ICD-10-CM | POA: Diagnosis not present

## 2024-02-19 DIAGNOSIS — D123 Benign neoplasm of transverse colon: Secondary | ICD-10-CM | POA: Diagnosis not present

## 2024-02-19 DIAGNOSIS — D62 Acute posthemorrhagic anemia: Secondary | ICD-10-CM

## 2024-02-19 DIAGNOSIS — Z7901 Long term (current) use of anticoagulants: Secondary | ICD-10-CM | POA: Diagnosis not present

## 2024-02-19 DIAGNOSIS — D122 Benign neoplasm of ascending colon: Secondary | ICD-10-CM | POA: Diagnosis not present

## 2024-02-19 DIAGNOSIS — F32A Depression, unspecified: Secondary | ICD-10-CM | POA: Diagnosis present

## 2024-02-19 DIAGNOSIS — I480 Paroxysmal atrial fibrillation: Secondary | ICD-10-CM | POA: Diagnosis present

## 2024-02-19 DIAGNOSIS — K625 Hemorrhage of anus and rectum: Secondary | ICD-10-CM | POA: Diagnosis not present

## 2024-02-19 DIAGNOSIS — Z885 Allergy status to narcotic agent status: Secondary | ICD-10-CM

## 2024-02-19 DIAGNOSIS — Z888 Allergy status to other drugs, medicaments and biological substances status: Secondary | ICD-10-CM

## 2024-02-19 DIAGNOSIS — Z96643 Presence of artificial hip joint, bilateral: Secondary | ICD-10-CM | POA: Diagnosis present

## 2024-02-19 DIAGNOSIS — Z79899 Other long term (current) drug therapy: Secondary | ICD-10-CM

## 2024-02-19 DIAGNOSIS — G8929 Other chronic pain: Secondary | ICD-10-CM | POA: Diagnosis present

## 2024-02-19 DIAGNOSIS — E785 Hyperlipidemia, unspecified: Secondary | ICD-10-CM | POA: Diagnosis present

## 2024-02-19 DIAGNOSIS — K219 Gastro-esophageal reflux disease without esophagitis: Secondary | ICD-10-CM | POA: Diagnosis present

## 2024-02-19 DIAGNOSIS — E1122 Type 2 diabetes mellitus with diabetic chronic kidney disease: Secondary | ICD-10-CM | POA: Diagnosis present

## 2024-02-19 DIAGNOSIS — D649 Anemia, unspecified: Secondary | ICD-10-CM

## 2024-02-19 DIAGNOSIS — I10 Essential (primary) hypertension: Secondary | ICD-10-CM | POA: Diagnosis not present

## 2024-02-19 DIAGNOSIS — E669 Obesity, unspecified: Secondary | ICD-10-CM | POA: Diagnosis present

## 2024-02-19 DIAGNOSIS — Z87891 Personal history of nicotine dependence: Secondary | ICD-10-CM

## 2024-02-19 DIAGNOSIS — Z8673 Personal history of transient ischemic attack (TIA), and cerebral infarction without residual deficits: Secondary | ICD-10-CM | POA: Diagnosis not present

## 2024-02-19 DIAGNOSIS — D125 Benign neoplasm of sigmoid colon: Secondary | ICD-10-CM | POA: Diagnosis present

## 2024-02-19 DIAGNOSIS — J45909 Unspecified asthma, uncomplicated: Secondary | ICD-10-CM | POA: Diagnosis present

## 2024-02-19 DIAGNOSIS — Z6833 Body mass index (BMI) 33.0-33.9, adult: Secondary | ICD-10-CM

## 2024-02-19 DIAGNOSIS — Z66 Do not resuscitate: Secondary | ICD-10-CM | POA: Diagnosis present

## 2024-02-19 DIAGNOSIS — I129 Hypertensive chronic kidney disease with stage 1 through stage 4 chronic kidney disease, or unspecified chronic kidney disease: Secondary | ICD-10-CM | POA: Diagnosis present

## 2024-02-19 DIAGNOSIS — D124 Benign neoplasm of descending colon: Secondary | ICD-10-CM | POA: Diagnosis present

## 2024-02-19 DIAGNOSIS — Z96611 Presence of right artificial shoulder joint: Secondary | ICD-10-CM | POA: Diagnosis present

## 2024-02-19 DIAGNOSIS — K922 Gastrointestinal hemorrhage, unspecified: Principal | ICD-10-CM

## 2024-02-19 DIAGNOSIS — K921 Melena: Secondary | ICD-10-CM | POA: Diagnosis not present

## 2024-02-19 HISTORY — DX: Paroxysmal atrial fibrillation: I48.0

## 2024-02-19 LAB — CBC
HCT: 19.5 % — ABNORMAL LOW (ref 36.0–46.0)
Hemoglobin: 6.2 g/dL — CL (ref 12.0–15.0)
MCH: 29.8 pg (ref 26.0–34.0)
MCHC: 31.8 g/dL (ref 30.0–36.0)
MCV: 93.8 fL (ref 80.0–100.0)
Platelets: 243 10*3/uL (ref 150–400)
RBC: 2.08 MIL/uL — ABNORMAL LOW (ref 3.87–5.11)
RDW: 15.1 % (ref 11.5–15.5)
WBC: 10.1 10*3/uL (ref 4.0–10.5)
nRBC: 0 % (ref 0.0–0.2)

## 2024-02-19 LAB — COMPREHENSIVE METABOLIC PANEL WITH GFR
ALT: 11 U/L (ref 0–44)
AST: 13 U/L — ABNORMAL LOW (ref 15–41)
Albumin: 2.7 g/dL — ABNORMAL LOW (ref 3.5–5.0)
Alkaline Phosphatase: 30 U/L — ABNORMAL LOW (ref 38–126)
Anion gap: 9 (ref 5–15)
BUN: 63 mg/dL — ABNORMAL HIGH (ref 8–23)
CO2: 20 mmol/L — ABNORMAL LOW (ref 22–32)
Calcium: 8 mg/dL — ABNORMAL LOW (ref 8.9–10.3)
Chloride: 110 mmol/L (ref 98–111)
Creatinine, Ser: 1.43 mg/dL — ABNORMAL HIGH (ref 0.44–1.00)
GFR, Estimated: 35 mL/min — ABNORMAL LOW (ref >60)
Glucose, Bld: 271 mg/dL — ABNORMAL HIGH (ref 70–99)
Potassium: 5.2 mmol/L — ABNORMAL HIGH (ref 3.5–5.1)
Sodium: 139 mmol/L (ref 135–145)
Total Bilirubin: 0.4 mg/dL (ref 0.0–1.2)
Total Protein: 5.1 g/dL — ABNORMAL LOW (ref 6.5–8.1)

## 2024-02-19 LAB — PREPARE RBC (CROSSMATCH)

## 2024-02-19 LAB — LIPASE, BLOOD: Lipase: 23 U/L (ref 11–51)

## 2024-02-19 LAB — PROTIME-INR
INR: 1.2 (ref 0.8–1.2)
Prothrombin Time: 15.8 s — ABNORMAL HIGH (ref 11.4–15.2)

## 2024-02-19 MED ORDER — DONEPEZIL HCL 5 MG PO TABS
5.0000 mg | ORAL_TABLET | Freq: Every day | ORAL | Status: DC
  Administered 2024-02-19 – 2024-02-21 (×3): 5 mg via ORAL
  Filled 2024-02-19 (×3): qty 1

## 2024-02-19 MED ORDER — CLONIDINE HCL 0.2 MG PO TABS: 0.2000 mg | ORAL_TABLET | Freq: Three times a day (TID) | ORAL | Status: DC

## 2024-02-19 MED ORDER — SODIUM CHLORIDE 0.9 % IV BOLUS
1000.0000 mL | Freq: Once | INTRAVENOUS | Status: AC
  Administered 2024-02-19: 1000 mL via INTRAVENOUS

## 2024-02-19 MED ORDER — ALPRAZOLAM 0.5 MG PO TABS
0.5000 mg | ORAL_TABLET | Freq: Two times a day (BID) | ORAL | Status: DC | PRN
  Administered 2024-02-19 – 2024-02-21 (×3): 0.5 mg via ORAL
  Filled 2024-02-19 (×4): qty 1

## 2024-02-19 MED ORDER — METOPROLOL TARTRATE 25 MG PO TABS
25.0000 mg | ORAL_TABLET | Freq: Two times a day (BID) | ORAL | Status: DC
  Administered 2024-02-19 – 2024-02-22 (×7): 25 mg via ORAL
  Filled 2024-02-19 (×7): qty 1

## 2024-02-19 MED ORDER — BUSPIRONE HCL 5 MG PO TABS
5.0000 mg | ORAL_TABLET | Freq: Two times a day (BID) | ORAL | Status: DC
  Administered 2024-02-19 – 2024-02-22 (×7): 5 mg via ORAL
  Filled 2024-02-19 (×7): qty 1

## 2024-02-19 MED ORDER — ONDANSETRON HCL 4 MG/2ML IJ SOLN: 4.0000 mg | Freq: Four times a day (QID) | INTRAMUSCULAR | Status: DC | PRN

## 2024-02-19 MED ORDER — ONDANSETRON HCL 4 MG PO TABS: 4.0000 mg | ORAL_TABLET | Freq: Four times a day (QID) | ORAL | Status: DC | PRN

## 2024-02-19 MED ORDER — QUETIAPINE FUMARATE 25 MG PO TABS
100.0000 mg | ORAL_TABLET | Freq: Two times a day (BID) | ORAL | Status: DC
  Administered 2024-02-19 – 2024-02-22 (×7): 100 mg via ORAL
  Filled 2024-02-19 (×7): qty 4

## 2024-02-19 MED ORDER — AMLODIPINE BESYLATE 5 MG PO TABS: 10.0000 mg | ORAL_TABLET | Freq: Every day | ORAL | Status: DC

## 2024-02-19 MED ORDER — EZETIMIBE 10 MG PO TABS
10.0000 mg | ORAL_TABLET | Freq: Every day | ORAL | Status: DC
  Administered 2024-02-19 – 2024-02-22 (×4): 10 mg via ORAL
  Filled 2024-02-19 (×4): qty 1

## 2024-02-19 MED ORDER — SERTRALINE HCL 50 MG PO TABS
75.0000 mg | ORAL_TABLET | Freq: Every day | ORAL | Status: DC
  Administered 2024-02-19 – 2024-02-22 (×4): 75 mg via ORAL
  Filled 2024-02-19 (×4): qty 2

## 2024-02-19 MED ORDER — SODIUM CHLORIDE 0.9% IV SOLUTION: Freq: Once | INTRAVENOUS | Status: AC

## 2024-02-19 MED ORDER — GABAPENTIN 300 MG PO CAPS
300.0000 mg | ORAL_CAPSULE | Freq: Three times a day (TID) | ORAL | Status: DC
  Administered 2024-02-19 – 2024-02-22 (×10): 300 mg via ORAL
  Filled 2024-02-19 (×10): qty 1

## 2024-02-19 MED ORDER — ACETAMINOPHEN 650 MG RE SUPP: 650.0000 mg | Freq: Four times a day (QID) | RECTAL | Status: DC | PRN

## 2024-02-19 MED ORDER — ACETAMINOPHEN 325 MG PO TABS
650.0000 mg | ORAL_TABLET | Freq: Four times a day (QID) | ORAL | Status: DC | PRN
  Administered 2024-02-19 – 2024-02-22 (×3): 650 mg via ORAL
  Filled 2024-02-19 (×3): qty 2

## 2024-02-19 MED ORDER — HYDRALAZINE HCL 50 MG PO TABS: 100.0000 mg | ORAL_TABLET | Freq: Three times a day (TID) | ORAL | Status: DC

## 2024-02-19 NOTE — ED Notes (Signed)
 Talked to son and gave update on pt.

## 2024-02-19 NOTE — Consult Note (Signed)
 Gastroenterology Consult   Referring Provider: No ref. provider found Primary Care Physician:  Corrington, Kip A, MD Primary Gastroenterologist:  not established   Patient ID: [name]; [id number]; Dec 18, 1931   Admit date: 02/19/2024  LOS: 0 days   Date of Consultation: 02/19/2024  Reason for Consultation:  rectal bleeding, anemia   History of Present Illness   [name] is a 89 y.o. year old female with history of CKD, DM, HTN, A fib on eliquis who presented to the ED this morning with c/o rectal bleeding since Monday with staff at nursing facility ([facility]) noting blood in patient's stool, she complained of some intermittent abdominal pain as well but none on arrival.   ED Course: Hgb 6.2 (baseline 9-11 over the last year, dropped in January s/p fall with hematoma formation) K+ 5.2 , BUN 63 (baseline around 30/40) Creat 1.43, calcium  8  notably patient was seen in the hospital in January for a fall, CT A/P at that time with large lower back hematoma, hemoglobin dropped to but patient was not transfused   Consult: Patient states she noticed rectal bleeding on Monday, denies any abdominal pain, she reports a second  episode of bleeding yesterday with BRB in the toilet. She endorses baseline is 2 BMs per day. She endorses normal stooling, no constipation or diarrhea. No nausea or vomiting. She is unsure of any new medications. She is unsure of last dose of Eliquis, she typically takes this twice a day. No changes in appetite or weight loss. She takes PRN aspirin  for leg pain, she notes this is not often, thinks it is 324mg . Patient's grand daughter reports approximately 1 year ago she had some rectal bleeding but reports that patient did not tell family until a month or two later,  she is unsure what workup or what the cause of bleeding was. Denies any black colored stools. She endorses some SOB at times, cannot pinpoint how often, usually occurs with exertion.  Granddaughter reports patient had near syncopal episode on the toilet prior to her arrival to the ED.   Last dose of Eliquis: unsure of last dose, last night?   Last EGD: never   Last Colonoscopy: does not think she has had one in the past.    Past Medical History:  Diagnosis Date   Anxiety    Arthritis    Asthma    Cancer (HCC)    SKIN CA OF FACE   Chronic kidney disease    Chronic pain    COVID    moderate case - had pneumonia   Depression    Diabetes mellitus without complication (HCC)    Gallstones    GERD (gastroesophageal reflux disease)    Hyperlipidemia    Hypertension    Insomnia    Iron  deficiency anemia    Paroxysmal atrial fibrillation (HCC)    Peri-prosthetic patellar fracture 09/19/2022   Pneumonia    Prosthetic joint infection (HCC)    RBBB 03/29/2022   TIA (transient ischemic attack)     Past Surgical History:  Procedure Laterality Date    ARM FRACTURE     LEFT  and right   CHOLECYSTECTOMY     DILATION AND CURETTAGE OF UTERUS     IRRIGATION AND DEBRIDEMENT KNEE  09/12/2011   Procedure: IRRIGATION AND DEBRIDEMENT KNEE;  Surgeon: Ilean Mall;  Location: [facility];  Service: Orthopedics;  Laterality: Right;  I&D RIGHT TKA REVISE BEARINGS/MBT. Poly exchange   PATELLAR TENDON REPAIR Left 09/21/2022  Procedure: LEFT PATELLAR TENDON REPAIR;  Surgeon: Wendolyn Hamburger, MD;  Location: [facility] OR;  Service: Orthopedics;  Laterality: Left;   REPLACEMENT TOTAL KNEE     right   TOTAL HIP ARTHROPLASTY Left    TOTAL HIP ARTHROPLASTY Right 01/20/2018   Procedure: TOTAL HIP ARTHROPLASTY WITH REMOVAL OF INTRAMEDULLARY NAIL;  Surgeon: Wendolyn Hamburger, MD;  Location: [facility] OR;  Service: Orthopedics;  Laterality: Right;   TOTAL KNEE ARTHROPLASTY Left 09/03/2022   Procedure: LEFT TOTAL KNEE ARTHROPLASTY;  Surgeon: Wendolyn Hamburger, MD;  Location: [facility] ORS;  Service: Orthopedics;  Laterality: Left;   TOTAL SHOULDER ARTHROPLASTY Right 10/02/2018   Procedure: REVERSE TOTAL SHOULDER;  Surgeon:  Sammye Cristal, MD;  Location: [facility] OR;  Service: Orthopedics;  Laterality: Right;    Prior to Admission medications   Medication Sig Start Date End Date Taking? Authorizing Provider  acetaminophen  (TYLENOL ) 325 MG tablet Take 650 mg by mouth every 6 (six) hours as needed.   Yes [provider]  ALPRAZolam  (XANAX ) 0.5 MG tablet Take 1 tablet (0.5 mg total) by mouth 2 (two) times daily as needed for anxiety. 10/17/23  Yes Shah, Pratik D, DO  amLODipine  (NORVASC ) 10 MG tablet Take 10 mg by mouth daily.   Yes [provider]  apixaban (ELIQUIS) 5 MG TABS tablet Take 5 mg by mouth 2 (two) times daily.   Yes [provider]  B Complex-C (SUPER B COMPLEX PO) Take 1 tablet by mouth daily.   Yes [provider]  busPIRone  (BUSPAR ) 5 MG tablet Take 1 tablet (5 mg total) by mouth 2 (two) times daily. 10/17/23  Yes Mason Sole, Pratik D, DO  Cholecalciferol 50 MCG (2000 UT) CAPS Take 2,000 Units by mouth daily at 6 (six) AM.   Yes [provider]  cloNIDine  (CATAPRES ) 0.2 MG tablet Take 1 tablet (0.2 mg total) by mouth 3 (three) times daily. 03/05/23  Yes Justina Oman, MD  donepezil  (ARICEPT ) 5 MG tablet Take 5 mg by mouth at bedtime.   Yes [provider]  ezetimibe  (ZETIA ) 10 MG tablet Take 10 mg by mouth daily. 10/03/20  Yes [provider]  ferrous sulfate 325 (65 FE) MG tablet Take 325 mg by mouth daily with breakfast.   Yes [provider]  gabapentin  (NEURONTIN ) 300 MG capsule Take 1 capsule (300 mg total) by mouth 3 (three) times daily. Patient taking differently: Take 300 mg by mouth 3 (three) times daily. 12/01/17  Yes Armenta Landau, MD  hydrALAZINE  (APRESOLINE ) 100 MG tablet Take 1 tablet (100 mg total) by mouth every 8 (eight) hours. 10/17/23  Yes Shah, Pratik D, DO  metoprolol  tartrate (LOPRESSOR ) 25 MG tablet Take 1 tablet (25 mg total) by mouth 2 (two) times daily. 10/17/23  Yes Shah, Pratik D, DO  Multiple Vitamins-Minerals  (MULTIVITAMIN WOMENS 50+ ADV) TABS Take 1 tablet by mouth daily.   Yes [provider]  oxyCODONE  (ROXICODONE ) 5 MG immediate release tablet Take 1 tablet (5 mg total) by mouth every 8 (eight) hours as needed for severe pain (pain score 7-10) or breakthrough pain. Take 1 tablet by mouth every 4-6 hours as needed for post op pain 10/17/23  Yes Shah, Pratik D, DO  QUEtiapine  (SEROQUEL ) 25 MG tablet Take 100 mg by mouth 2 (two) times daily.   Yes [provider]  saccharomyces boulardii (FLORASTOR) 250 MG capsule Take 250 mg by mouth daily at 6 (six) AM.   Yes [provider]  SERTRALINE  HCL PO Take 75 mg  by mouth in the morning.   Yes [provider]    Current Facility-Administered Medications  Medication Dose Route Frequency Provider Last Rate Last Admin   acetaminophen  (TYLENOL ) tablet 650 mg  650 mg Oral Q6H PRN Dorrell, Robert, MD       Or   acetaminophen  (TYLENOL ) suppository 650 mg  650 mg Rectal Q6H PRN Dorrell, Robert, MD       ALPRAZolam  (XANAX ) tablet 0.5 mg  0.5 mg Oral BID PRN Myrl Askew, MD       busPIRone  (BUSPAR ) tablet 5 mg  5 mg Oral BID Myrl Askew, MD       donepezil  (ARICEPT ) tablet 5 mg  5 mg Oral QHS Myrl Askew, MD       ezetimibe  (ZETIA ) tablet 10 mg  10 mg Oral Daily Dorrell, Robert, MD       gabapentin  (NEURONTIN ) capsule 300 mg  300 mg Oral TID Myrl Askew, MD       metoprolol  tartrate (LOPRESSOR ) tablet 25 mg  25 mg Oral BID Myrl Askew, MD       ondansetron  (ZOFRAN ) tablet 4 mg  4 mg Oral Q6H PRN Myrl Askew, MD       Or   ondansetron  (ZOFRAN ) injection 4 mg  4 mg Intravenous Q6H PRN Dorrell, Robert, MD       QUEtiapine  (SEROQUEL ) tablet 100 mg  100 mg Oral BID Myrl Askew, MD       sertraline (ZOLOFT) tablet 75 mg  75 mg Oral Daily Dorrell, Robert, MD       Current Outpatient Medications  Medication Sig Dispense Refill   acetaminophen  (TYLENOL ) 325 MG tablet Take 650 mg by mouth every 6 (six) hours  as needed.     ALPRAZolam  (XANAX ) 0.5 MG tablet Take 1 tablet (0.5 mg total) by mouth 2 (two) times daily as needed for anxiety. 15 tablet 0   amLODipine  (NORVASC ) 10 MG tablet Take 10 mg by mouth daily.     apixaban (ELIQUIS) 5 MG TABS tablet Take 5 mg by mouth 2 (two) times daily.     B Complex-C (SUPER B COMPLEX PO) Take 1 tablet by mouth daily.     busPIRone  (BUSPAR ) 5 MG tablet Take 1 tablet (5 mg total) by mouth 2 (two) times daily. 10 tablet 0   Cholecalciferol 50 MCG (2000 UT) CAPS Take 2,000 Units by mouth daily at 6 (six) AM.     cloNIDine  (CATAPRES ) 0.2 MG tablet Take 1 tablet (0.2 mg total) by mouth 3 (three) times daily.     donepezil  (ARICEPT ) 5 MG tablet Take 5 mg by mouth at bedtime.     ezetimibe  (ZETIA ) 10 MG tablet Take 10 mg by mouth daily.     ferrous sulfate 325 (65 FE) MG tablet Take 325 mg by mouth daily with breakfast.     gabapentin  (NEURONTIN ) 300 MG capsule Take 1 capsule (300 mg total) by mouth 3 (three) times daily. (Patient taking differently: Take 300 mg by mouth 3 (three) times daily.) 90 capsule 1   hydrALAZINE  (APRESOLINE ) 100 MG tablet Take 1 tablet (100 mg total) by mouth every 8 (eight) hours. 90 tablet 2   metoprolol  tartrate (LOPRESSOR ) 25 MG tablet Take 1 tablet (25 mg total) by mouth 2 (two) times daily. 60 tablet 2   Multiple Vitamins-Minerals (MULTIVITAMIN WOMENS 50+ ADV) TABS Take 1 tablet by mouth daily.     oxyCODONE  (ROXICODONE ) 5 MG immediate release tablet Take 1 tablet (5 mg total) by mouth  every 8 (eight) hours as needed for severe pain (pain score 7-10) or breakthrough pain. Take 1 tablet by mouth every 4-6 hours as needed for post op pain 10 tablet 0   QUEtiapine  (SEROQUEL ) 25 MG tablet Take 100 mg by mouth 2 (two) times daily.     saccharomyces boulardii (FLORASTOR) 250 MG capsule Take 250 mg by mouth daily at 6 (six) AM.     SERTRALINE  HCL PO Take 75 mg by mouth in the morning.      Allergies as of 02/19/2024 - Review Complete 02/19/2024   Allergen Reaction Noted   Lisinopril Swelling 09/24/1996   Hydrochlorothiazide Other (See Comments) 09/10/2011   Lipitor [atorvastatin calcium ] Other (See Comments) 09/10/2011   Thiazide-type diuretics Other (See Comments)    Zocor [simvastatin] Swelling 09/10/2011   Codeine Nausea Only 09/24/2001    Review of Systems   Gen: Denies any fever, chills, loss of appetite, change in weight or weight loss CV: Denies chest pain, heart palpitations, syncope, edema  Resp: Denies shortness of breath with rest, cough, wheezing, coughing up blood, and pleurisy. GI:  denies melena, nausea, vomiting, diarrhea, constipation, dysphagia, odyonophagia, early satiety or weight loss. +hematochezia GU : Denies urinary burning, blood in urine, urinary frequency, and urinary incontinence. MS: Denies joint pain, limitation of movement, swelling, cramps, and atrophy.  Derm: Denies rash, itching, dry skin, hives. Psych: Denies depression, anxiety, memory loss, hallucinations, and confusion. Heme: Denies bruising or bleeding Neuro:  Denies any headaches, dizziness, paresthesias, shaking  Physical Exam   Vital Signs in last 24 hours: Temp:  [97.9 F (36.6 C)-98.1 F (36.7 C)] 97.9 F (36.6 C) (05/28 0823) Pulse Rate:  [92-113] 103 (05/28 0855) Resp:  [14-22] 18 (05/28 0855) BP: (105-158)/(46-85) 158/61 (05/28 0855) SpO2:  [96 %-100 %] 97 % (05/28 0855) Weight:  [88.2 kg] 88.2 kg (05/28 0413)   General:   Alert,  Well-developed, well-nourished, pleasant and cooperative in NAD Head:  Normocephalic and atraumatic. Eyes:  Sclera clear, no icterus.   Conjunctiva pink. Ears:  Normal auditory acuity. Mouth:  No deformity or lesions, dentition normal. Lungs:  Clear throughout to auscultation.   No wheezes, crackles, or rhonchi. No acute distress. Heart:  Regular rate and rhythm; no murmurs, clicks, rubs,  or gallops. Abdomen:  Soft, nontender and nondistended. No masses, hepatosplenomegaly or hernias noted.  Normal bowel sounds, without guarding, and without rebound.   Msk:  Symmetrical without gross deformities. Normal posture. Extremities:  Without clubbing or edema. Neurologic:  Alert and  oriented x4. Skin:  Intact without significant lesions or rashes. Psych:  Alert and cooperative. Normal mood and affect.  Labs/Studies   Recent Labs Recent Labs    02/19/24 0420  WBC 10.1  HGB 6.2*  HCT 19.5*  PLT 243   BMET Recent Labs    02/19/24 0420  NA 139  K 5.2*  CL 110  CO2 20*  GLUCOSE 271*  BUN 63*  CREATININE 1.43*  CALCIUM  8.0*   LFT Recent Labs    02/19/24 0420  PROT 5.1*  ALBUMIN 2.7*  AST 13*  ALT 11  ALKPHOS 30*  BILITOT 0.4   PT/INR Recent Labs    02/19/24 0420  LABPROT 15.8*  INR 1.2    Assessment   [name] is a 89 y.o. year old female  with history of CKD, DM, HTN, A fib on eliquis who presented to the ED this morning with c/o rectal bleeding since Monday, hgb on admission was 6.2, GI consulted  for further evaluation  Rectal bleeding and normocytic anemia:   -baseline hgb 9-11 range over the past year -had acute drop in hgb in January after fall with hematoma formation but no blood transfusion at that time -hgb 6.2 on admission, 2 units PRBCs ordered -reported 2 episodes of hematochezia, 1 Monday and 1 Tuesday  -denies any other GI symptoms -no prior Colonoscopy she can recall -did have rectal bleeding about 1 year ago though patient nor family are sure of etiology/workup for this -Differentials include diverticular bleed, bleeding polyps, AVMs, malignancy.  Last Eliquis dose: patient thinks possibly last night, unsure what time.  I attempted to contact her SNF ([facility]) multiple times to confirm timing of her last AC dose, but was unable to reach anyone    Discussed colonoscopy with the patient especially given her advanced age, however, she is amenable to proceeding with this for further evaluation.  Indications, risks and benefits  of procedure discussed in detail with patient. Patient verbalized understanding and is in agreement to proceed with Colonoscopy.   Will plan for colonoscopy likely Friday to give appropriate time for eliquis washout  Plan / Recommendations    Continue to Hold eliquis Trend h&h, transfuse for hgb <7 Monitor for overt GI bleeding PPI BID Clear liquid diet  Plan for colonoscopy Friday    02/19/2024, 8:59 AM  Katrinka Herbison L. Haydon Kalmar, MSN, APRN, AGNP-C Adult-Gerontology Nurse Practitioner [facility] at [facility]

## 2024-02-19 NOTE — Plan of Care (Signed)

## 2024-02-19 NOTE — ED Notes (Signed)
 ED Provider at bedside.

## 2024-02-19 NOTE — ED Triage Notes (Signed)
 Pt BIB [facility] from [facility] for c/o rectal bleeding that started yesterday  Pt has some intermittent abdominal pain but states at this moment she has no pain

## 2024-02-19 NOTE — ED Provider Notes (Signed)
 [facility]-EMERGENCY DEPT [facility] Emergency Department Provider Note MRN:  [id number]  Arrival date & time: 02/19/24     Chief Complaint   Rectal Bleeding   History of Present Illness   [name] is a 89 y.o. year-old female with a history of CKD, diabetes, hypertension, A-fib presenting to the ED with chief complaint of GI bleeding.  Blood in stool reported from care facility.  Happening since yesterday.  Intermittent abdominal pain but no pain currently.  Patient denies vomiting.  Takes Eliquis.  Review of Systems  A thorough review of systems was obtained and all systems are negative except as noted in the HPI and PMH.   Patient's Health History    Past Medical History:  Diagnosis Date   Anxiety    Arthritis    Asthma    Cancer (HCC)    SKIN CA OF FACE   Chronic kidney disease    Chronic pain    COVID    moderate case - had pneumonia   Depression    Diabetes mellitus without complication (HCC)    Gallstones    GERD (gastroesophageal reflux disease)    Hyperlipidemia    Hypertension    Insomnia    Iron  deficiency anemia    Paroxysmal atrial fibrillation (HCC)    Peri-prosthetic patellar fracture 09/19/2022   Pneumonia    Prosthetic joint infection (HCC)    RBBB 03/29/2022   TIA (transient ischemic attack)     Past Surgical History:  Procedure Laterality Date    ARM FRACTURE     LEFT  and right   CHOLECYSTECTOMY     DILATION AND CURETTAGE OF UTERUS     IRRIGATION AND DEBRIDEMENT KNEE  09/12/2011   Procedure: IRRIGATION AND DEBRIDEMENT KNEE;  Surgeon: Ilean Mall;  Location: [facility] OR;  Service: Orthopedics;  Laterality: Right;  I&D RIGHT TKA REVISE BEARINGS/MBT. Poly exchange   PATELLAR TENDON REPAIR Left 09/21/2022   Procedure: LEFT PATELLAR TENDON REPAIR;  Surgeon: Wendolyn Hamburger, MD;  Location: [facility] OR;  Service: Orthopedics;  Laterality: Left;   REPLACEMENT TOTAL KNEE     right   TOTAL HIP ARTHROPLASTY Left    TOTAL HIP ARTHROPLASTY Right 01/20/2018    Procedure: TOTAL HIP ARTHROPLASTY WITH REMOVAL OF INTRAMEDULLARY NAIL;  Surgeon: Wendolyn Hamburger, MD;  Location: [facility] OR;  Service: Orthopedics;  Laterality: Right;   TOTAL KNEE ARTHROPLASTY Left 09/03/2022   Procedure: LEFT TOTAL KNEE ARTHROPLASTY;  Surgeon: Wendolyn Hamburger, MD;  Location: [facility] ORS;  Service: Orthopedics;  Laterality: Left;   TOTAL SHOULDER ARTHROPLASTY Right 10/02/2018   Procedure: REVERSE TOTAL SHOULDER;  Surgeon: Sammye Cristal, MD;  Location: [facility] OR;  Service: Orthopedics;  Laterality: Right;    Family History  Problem Relation Age of Onset   Diabetes Mellitus II Daughter     Social History   Socioeconomic History   Marital status: Widowed    Spouse name: Not on file   Number of children: Not on file   Years of education: Not on file   Highest education level: Not on file  Occupational History   Not on file  Tobacco Use   Smoking status: Former    Current packs/day: 0.00    Average packs/day: 0.3 packs/day for 10.0 years (2.5 ttl pk-yrs)    Types: Cigarettes    Start date: 35    Quit date: 86    Years since quitting: 45.4   Smokeless tobacco: Never   Tobacco comments:    Quit 50 years ago  Vaping Use   Vaping status: Never Used  Substance and Sexual Activity   Alcohol use: No   Drug use: No   Sexual activity: Not Currently  Other Topics Concern   Not on file  Social History Narrative   Not on file   Social Drivers of Health   Financial Resource Strain: Low Risk  (03/28/2023)   Received from [facility], [facility]   Overall [employer] (CARDIA)    Difficulty of Paying Living Expenses: Not hard at all  Food Insecurity: No Food Insecurity (10/14/2023)   Hunger Vital Sign    Worried About Running Out of Food in the Last Year: Never true    Ran Out of Food in the Last Year: Never true  Transportation Needs: No Transportation Needs (10/14/2023)   PRAPARE - Administrator, Civil Service  (Medical): No    Lack of Transportation (Non-Medical): No  Physical Activity: Insufficiently Active (02/05/2023)   Received from [facility], [facility]   Exercise Vital Sign    Days of Exercise per Week: 2 days    Minutes of Exercise per Session: 10 min  Stress: Stress Concern Present (02/05/2023)   Received from [facility], [facility]ational [facility] - Occupational Stress Questionnaire    Feeling of Stress : Very much  Social Connections: Patient Declined (10/14/2023)   Social Connection and Isolation Panel [NHANES]    Frequency of Communication with Friends and Family: Patient declined    Frequency of Social Gatherings with Friends and Family: Patient declined    Attends Religious Services: Patient declined    Active Member of Clubs or Organizations: Patient declined    Attends Banker Meetings: Patient declined    Marital Status: Patient declined  Intimate Partner Violence: Not At Risk (10/14/2023)   Humiliation, Afraid, Rape, and Kick questionnaire    Fear of Current or Ex-Partner: No    Emotionally Abused: No    Physically Abused: No    Sexually Abused: No     Physical Exam   Vitals:   02/19/24 0445 02/19/24 0500  BP: (!) 119/50 (!) 128/51  Pulse: (!) 107 (!) 108  Resp: (!) 22 19  Temp:    SpO2: 98% 97%    CONSTITUTIONAL: Well-appearing, NAD NEURO/PSYCH:  Alert and oriented x 3, no focal deficits EYES:  eyes equal and reactive ENT/NECK:  no LAD, no JVD CARDIO: Tachycardic rate, well-perfused, normal S1 and S2 PULM:  CTAB no wheezing or rhonchi GI/GU:  non-distended, non-tender MSK/SPINE:  No gross deformities, no edema SKIN:  no rash, atraumatic   *Additional and/or pertinent findings included in MDM below  Diagnostic and Interventional Summary    EKG Interpretation Date/Time:  Wednesday Feb 19 2024 04:20:36 EDT Ventricular Rate:  109 PR Interval:  70 QRS Duration:  144 QT Interval:  361 QTC  Calculation: 487 R Axis:   64  Text Interpretation: Sinus tachycardia Right bundle branch block Confirmed by Gwenetta Lennert [phone]) on 02/19/2024 5:00:18 AM       Labs Reviewed  PROTIME-INR - Abnormal; Notable for the following components:      Result Value   Prothrombin Time 15.8 (*)    All other components within normal limits  CBC - Abnormal; Notable for the following components:   RBC 2.08 (*)    Hemoglobin 6.2 (*)    HCT 19.5 (*)    All other components within normal limits  COMPREHENSIVE METABOLIC  PANEL WITH GFR - Abnormal; Notable for the following components:   Potassium 5.2 (*)    CO2 20 (*)    Glucose, Bld 271 (*)    BUN 63 (*)    Creatinine, Ser 1.43 (*)    Calcium  8.0 (*)    Total Protein 5.1 (*)    Albumin 2.7 (*)    AST 13 (*)    Alkaline Phosphatase 30 (*)    GFR, Estimated 35 (*)    All other components within normal limits  LIPASE, BLOOD  TYPE AND SCREEN  PREPARE RBC (CROSSMATCH)    DG Chest Port 1 View  Final Result      Medications  0.9 %  sodium chloride  infusion (Manually program via Guardrails IV Fluids) (has no administration in time range)  sodium chloride  0.9 % bolus 1,000 mL (1,000 mLs Intravenous New Bag/Given 02/19/24 0452)     Procedures  /  Critical Care .Critical Care  Performed by: Edson Graces, MD Authorized by: Edson Graces, MD   Critical care provider statement:    Critical care time (minutes):  35   Critical care was necessary to treat or prevent imminent or life-threatening deterioration of the following conditions: Symptomatic anemia requiring blood transfusion.   Critical care was time spent personally by me on the following activities:  Development of treatment plan with patient or surrogate, discussions with consultants, evaluation of patient's response to treatment, examination of patient, ordering and review of laboratory studies, ordering and review of radiographic studies, ordering and performing treatments and  interventions, pulse oximetry, re-evaluation of patient's condition and review of old charts   ED Course and Medical Decision Making  Initial Impression and Ddx Patient well-appearing in no acute distress, mild tachycardia, soft diastolic blood pressure but seems well-perfused, fully conversant.  Abdomen soft nontender with no rebound guarding or rigidity.  Lower GI bleed, on Eliquis.  Awaiting labs, reassuring hemodynamics at this time.  Past medical/surgical history that increases complexity of ED encounter: A-fib, stroke  Interpretation of Diagnostics I personally reviewed the EKG and my interpretation is as follows: Sinus rhythm  Labs reveal hemoglobin of 6.2  Patient Reassessment and Ultimate Disposition/Management     Patient consents for blood transfusion, will request hospitalist admission.  Patient management required discussion with the following services or consulting groups:  Hospitalist Service  Complexity of Problems Addressed Acute illness or injury that poses threat of life of bodily function  Additional Data Reviewed and Analyzed Further history obtained from: EMS on arrival  Additional Factors Impacting ED Encounter Risk Consideration of hospitalization  Merrick Abe. Harless Lien, MD [facility] Emergency Medicine [facility] [email or web address]  Final Clinical Impressions(s) / ED Diagnoses     ICD-10-CM   1. Lower GI bleed  K92.2     2. Symptomatic anemia  D64.9       ED Discharge Orders     None        Discharge Instructions Discussed with and Provided to Patient:   Discharge Instructions   None      Edson Graces, MD 02/19/24 [phone]

## 2024-02-19 NOTE — ED Notes (Signed)
 This RN attempted to call [name] to update facility of pt's disposition. No answer at this time  This RN attempted to call pt's son. No answer at this time.

## 2024-02-19 NOTE — Hospital Course (Addendum)
 89 year old female with a history of hypertension, diabetes mellitus type 2, hyperlipidemia, CKD stage IIIb, paroxysmal atrial fibrillation, stroke, and chronic toe impairment presenting with hematochezia.  The patient is a poor historian secondary to her cognitive impairment.  History is obtained from review of the medical record and speaking with the patient.  The patient states that she went to use the commode and after trying to get up she was feeling some generalized weakness and some cramping in the upper abdomen.  Staff noted the patient to have some blood in the commode, and the patient was feeling clammy.  As result, the patient was transferred to the emergency department for further evaluation and treatment. In the ED, the patient was afebrile and hemodynamically stable with oxygen  saturation 96% on room air.  WBC 10.1, hemoglobin 6.2, platelet 243.  Sodium 139, potassium 5.2, bicarbonate 20, serum creatinine 1.43.  LFTs were unremarkable.  2 units PRBC were ordered.

## 2024-02-19 NOTE — H&P (Signed)
 History and Physical    SABINE TENENBAUM ZOX:[id number] DOB: 01/19/1932 DOA: 02/19/2024  PCP: Chandler Combs, MD   Chief Complaint:  gib  HPI: [name] is a 89 y.o. female with medical history significant of type 2 diabetes, GERD, hypertension, hyperlipidemia, paroxysmal A-fib on anticoagulation who is emergency department due to hematochezia.  Patient's nursing facility has noted bright red blood per rectum since yesterday.  She takes Eliquis.  She presented to the ER where she was found to be afebrile hemodynamically stable.  Labs were obtained which showed hemoglobin 6.2, INR 1.2, potassium 5.2, glucose 271, creatinine 1.4, lipase within normal limits.  Patient had chest x-ray which showed no acute findings.  No prior colonoscopy documented in chart.  No active bleeding since presentation.  She was transfused and admitted for further workup.   Review of Systems: Review of Systems  Constitutional:  Negative for chills and fever.  HENT: Negative.    Eyes: Negative.   Respiratory: Negative.    Cardiovascular:  Negative for chest pain.  Gastrointestinal: Negative.   Genitourinary: Negative.   Musculoskeletal: Negative.   Skin: Negative.   Neurological: Negative.   Endo/Heme/Allergies: Negative.      As per HPI otherwise 10 point review of systems negative.   Allergies  Allergen Reactions   Lisinopril Swelling    angioedema   Hydrochlorothiazide Other (See Comments)    UNSPECIFIED REACTION    Lipitor [Atorvastatin Calcium ] Other (See Comments)    UNSPECIFIED REACTION    Thiazide-Type Diuretics Other (See Comments)    UNSPECIFIED REACTION    Zocor [Simvastatin] Swelling    SWELLING REACTION UNSPECIFIED    Codeine Nausea Only    Past Medical History:  Diagnosis Date   Anxiety    Arthritis    Asthma    Cancer (HCC)    SKIN CA OF FACE   Chronic kidney disease    Chronic pain    COVID    moderate case - had pneumonia   Depression    Diabetes mellitus without  complication (HCC)    Gallstones    GERD (gastroesophageal reflux disease)    Hyperlipidemia    Hypertension    Insomnia    Iron  deficiency anemia    Paroxysmal atrial fibrillation (HCC)    Peri-prosthetic patellar fracture 09/19/2022   Pneumonia    Prosthetic joint infection (HCC)    RBBB 03/29/2022   TIA (transient ischemic attack)     Past Surgical History:  Procedure Laterality Date    ARM FRACTURE     LEFT  and right   CHOLECYSTECTOMY     DILATION AND CURETTAGE OF UTERUS     IRRIGATION AND DEBRIDEMENT KNEE  09/12/2011   Procedure: IRRIGATION AND DEBRIDEMENT KNEE;  Surgeon: Ilean Mall;  Location: [facility] OR;  Service: Orthopedics;  Laterality: Right;  I&D RIGHT TKA REVISE BEARINGS/MBT. Poly exchange   PATELLAR TENDON REPAIR Left 09/21/2022   Procedure: LEFT PATELLAR TENDON REPAIR;  Surgeon: Wendolyn Hamburger, MD;  Location: [facility] OR;  Service: Orthopedics;  Laterality: Left;   REPLACEMENT TOTAL KNEE     right   TOTAL HIP ARTHROPLASTY Left    TOTAL HIP ARTHROPLASTY Right 01/20/2018   Procedure: TOTAL HIP ARTHROPLASTY WITH REMOVAL OF INTRAMEDULLARY NAIL;  Surgeon: Wendolyn Hamburger, MD;  Location: [facility] OR;  Service: Orthopedics;  Laterality: Right;   TOTAL KNEE ARTHROPLASTY Left 09/03/2022   Procedure: LEFT TOTAL KNEE ARTHROPLASTY;  Surgeon: Wendolyn Hamburger, MD;  Location: [facility] ORS;  Service: Orthopedics;  Laterality:  Left;   TOTAL SHOULDER ARTHROPLASTY Right 10/02/2018   Procedure: REVERSE TOTAL SHOULDER;  Surgeon: Sammye Cristal, MD;  Location: [facility] OR;  Service: Orthopedics;  Laterality: Right;     reports that she quit smoking about 45 years ago. Her smoking use included cigarettes. She started smoking about 55 years ago. She has a 2.5 pack-year smoking history. She has never used smokeless tobacco. She reports that she does not drink alcohol and does not use drugs.  Family History  Problem Relation Age of Onset   Diabetes Mellitus II Daughter     Prior to Admission medications    Medication Sig Start Date End Date Taking? Authorizing Provider  acetaminophen  (TYLENOL ) 325 MG tablet Take 650 mg by mouth every 6 (six) hours as needed.   Yes [provider]  ALPRAZolam  (XANAX ) 0.5 MG tablet Take 1 tablet (0.5 mg total) by mouth 2 (two) times daily as needed for anxiety. 10/17/23  Yes Shah, Pratik D, DO  amLODipine  (NORVASC ) 10 MG tablet Take 10 mg by mouth daily.   Yes [provider]  apixaban (ELIQUIS) 5 MG TABS tablet Take 5 mg by mouth 2 (two) times daily.   Yes [provider]  B Complex-C (SUPER B COMPLEX PO) Take 1 tablet by mouth daily.   Yes [provider]  busPIRone  (BUSPAR ) 5 MG tablet Take 1 tablet (5 mg total) by mouth 2 (two) times daily. 10/17/23  Yes Mason Sole, Pratik D, DO  Cholecalciferol 50 MCG (2000 UT) CAPS Take 2,000 Units by mouth daily at 6 (six) AM.   Yes [provider]  cloNIDine  (CATAPRES ) 0.2 MG tablet Take 1 tablet (0.2 mg total) by mouth 3 (three) times daily. 03/05/23  Yes Justina Oman, MD  donepezil  (ARICEPT ) 5 MG tablet Take 5 mg by mouth at bedtime.   Yes [provider]  ezetimibe  (ZETIA ) 10 MG tablet Take 10 mg by mouth daily. 10/03/20  Yes [provider]  ferrous sulfate 325 (65 FE) MG tablet Take 325 mg by mouth daily with breakfast.   Yes [provider]  gabapentin  (NEURONTIN ) 300 MG capsule Take 1 capsule (300 mg total) by mouth 3 (three) times daily. Patient taking differently: Take 300 mg by mouth 3 (three) times daily. 12/01/17  Yes Armenta Landau, MD  hydrALAZINE  (APRESOLINE ) 100 MG tablet Take 1 tablet (100 mg total) by mouth every 8 (eight) hours. 10/17/23  Yes Shah, Pratik D, DO  metoprolol  tartrate (LOPRESSOR ) 25 MG tablet Take 1 tablet (25 mg total) by mouth 2 (two) times daily. 10/17/23  Yes Shah, Pratik D, DO  Multiple Vitamins-Minerals (MULTIVITAMIN WOMENS 50+ ADV) TABS Take 1 tablet by mouth daily.   Yes [provider]  oxyCODONE  (ROXICODONE ) 5  MG immediate release tablet Take 1 tablet (5 mg total) by mouth every 8 (eight) hours as needed for severe pain (pain score 7-10) or breakthrough pain. Take 1 tablet by mouth every 4-6 hours as needed for post op pain 10/17/23  Yes Shah, Pratik D, DO  QUEtiapine  (SEROQUEL ) 25 MG tablet Take 100 mg by mouth 2 (two) times daily.   Yes [provider]  saccharomyces boulardii (FLORASTOR) 250 MG capsule Take 250 mg by mouth daily at 6 (six) AM.   Yes [provider]  SERTRALINE HCL PO Take 75 mg by mouth in the morning.   Yes [provider]    Physical Exam: Vitals:   02/19/24 0500 02/19/24 0505 02/19/24 0515 02/19/24 0544  BP: (!) 128/51   Aaron Aas)  141/60  Pulse: (!) 108 95 92 (!) 104  Resp: 19 18 16 18   Temp:    97.9 F (36.6 C)  TempSrc:    Oral  SpO2: 97% 97% 97% 98%  Weight:      Height:       Physical Exam Vitals reviewed.  Constitutional:      Appearance: She is normal weight.  HENT:     Head: Normocephalic.     Nose: Nose normal. No congestion.     Mouth/Throat:     Mouth: Mucous membranes are moist.     Pharynx: Oropharynx is clear.  Eyes:     Conjunctiva/sclera: Conjunctivae normal.     Pupils: Pupils are equal, round, and reactive to light.  Cardiovascular:     Rate and Rhythm: Normal rate and regular rhythm.     Pulses: Normal pulses.     Heart sounds: Normal heart sounds.  Pulmonary:     Effort: Pulmonary effort is normal.     Breath sounds: Normal breath sounds.  Abdominal:     General: Abdomen is flat. Bowel sounds are normal.  Musculoskeletal:        General: Normal range of motion.     Cervical back: Normal range of motion.  Skin:    General: Skin is warm.     Capillary Refill: Capillary refill takes less than 2 seconds.  Neurological:     General: No focal deficit present.     Mental Status: She is alert.  Psychiatric:        Mood and Affect: Mood normal.        Labs on Admission: I have personally reviewed the patients's  labs and imaging studies.  Assessment/Plan Principal Problem:   Lower GI bleed   # # Hematochezia - Patient is on Eliquis - No prior evidence of colonoscopy in chart - Hemoglobin dropped to 6.2  Plan: N.p.o. Hold Eliquis Will need GI consultation in the morning Trend CBC  # Hypertension-continue on amlodipine , metoprolol , hydralazine ,, clonidine   # Paroxysmal A-fib-continue metoprolol , hold Eliquis  # Hyperlipidemia-continue Zetia   # Generalized anxiety-continue Xanax   # Cognitive impairment-continue Aricept   # Chronic pain-continue gabapentin   # Depression-continue sertraline , Seroquel     Admission status: Inpatient Med-Surg  Certification: The appropriate patient status for this patient is INPATIENT. Inpatient status is judged to be reasonable and necessary in order to provide the required intensity of service to ensure the patient's safety. The patient's presenting symptoms, physical exam findings, and initial radiographic and laboratory data in the context of their chronic comorbidities is felt to place them at high risk for further clinical deterioration. Furthermore, it is not anticipated that the patient will be medically stable for discharge from the hospital within 2 midnights of admission.   * I certify that at the point of admission it is my clinical judgment that the patient will require inpatient hospital care spanning beyond 2 midnights from the point of admission due to high intensity of service, high risk for further deterioration and high frequency of surveillance required.Myrl Askew MD [facility] If 7PM-7AM, please contact night-coverage [email or web address]  02/19/2024, 5:57 AM

## 2024-02-19 NOTE — Plan of Care (Signed)

## 2024-02-19 NOTE — Progress Notes (Addendum)
 PROGRESS NOTE  [name] YQM:[id number] DOB: 1932/02/06 DOA: 02/19/2024 PCP: Chandler Combs, MD  Brief History:  89 year old female with a history of hypertension, diabetes mellitus type 2, hyperlipidemia, CKD stage IIIb, paroxysmal atrial fibrillation, stroke, and chronic toe impairment presenting with hematochezia.  The patient is a poor historian secondary to her cognitive impairment.  History is obtained from review of the medical record and speaking with the patient.  The patient states that she went to use the commode and after trying to get up she was feeling some generalized weakness and some cramping in the upper abdomen.  Staff noted the patient to have some blood in the commode, and the patient was feeling clammy.  As result, the patient was transferred to the emergency department for further evaluation and treatment. In the ED, the patient was afebrile and hemodynamically stable with oxygen  saturation 96% on room air.  WBC 10.1, hemoglobin 6.2, platelet 243.  Sodium 139, potassium 5.2, bicarbonate 20, serum creatinine 1.43.  LFTs were unremarkable.  2 units PRBC were ordered.     Assessment/Plan: Hematochezia/acute blood loss anemia - GI consult - Baseline hemoglobin 9-10 - Transfusing 2 units PRBC - Clear liquid diet - No previously documented EGD or colonoscopy  Essential hypertension - Continue metoprolol  succinate - Holding hydralazine , clonidine , and amlodipine  secondary to soft blood pressure  Paroxysmal atrial fibrillation - Currently in sinus rhythm - Previously on apixaban, but appears to she is no longer taking this - Patient had admission 10/14/2023 1o 10/17/23 for right flank hematoma after a fall  Cognitive impairment - Continue Aricept  - Continue Seroquel  - Continue sertraline  Depression/anxiety - Continue buspirone  and sertraline          Family Communication:  no Family at bedside  Consultants:  GI  Code Status:  FULL /  DNR  DVT Prophylaxis:  SCDs   Procedures: As Listed in Progress Note Above  Antibiotics: None      Total time spent 50 minutes.  Greater than 50% spent face to face counseling and coordinating care.    Subjective: Patient denies fevers, chills, headache, chest pain, dyspnea, nausea, vomiting, diarrhea, abdominal pain, dysuria, hematuria, hematochezia, and melena.   Objective: Vitals:   02/19/24 0607 02/19/24 0630 02/19/24 0645 02/19/24 0700  BP: (!) 119/57 (!) 105/55  (!) 125/51  Pulse: 95 (!) 101 96 (!) 101  Resp: 16 14 17 18   Temp: 97.9 F (36.6 C)     TempSrc: Oral     SpO2: 97% 97% 97% 96%  Weight:      Height:        Intake/Output Summary (Last 24 hours) at 02/19/2024 0710 Last data filed at 02/19/2024 0542 Gross per 24 hour  Intake 1000 ml  Output --  Net 1000 ml   Weight change:  Exam:  General:  Pt is alert, follows commands appropriately, not in acute distress HEENT: No icterus, No thrush, No neck mass, [place]/AT Cardiovascular: RRR, S1/S2, no rubs, no gallops Respiratory: CTA bilaterally, no wheezing, no crackles, no rhonchi Abdomen: Soft/+BS, non tender, non distended, no guarding Extremities: No edema, No lymphangitis, No petechiae, No rashes, no synovitis   Data Reviewed: I have personally reviewed following labs and imaging studies Basic Metabolic Panel: Recent Labs  Lab 02/19/24 0420  NA 139  K 5.2*  CL 110  CO2 20*  GLUCOSE 271*  BUN 63*  CREATININE 1.43*  CALCIUM  8.0*   Liver Function Tests:  Recent Labs  Lab 02/19/24 0420  AST 13*  ALT 11  ALKPHOS 30*  BILITOT 0.4  PROT 5.1*  ALBUMIN 2.7*   Recent Labs  Lab 02/19/24 0420  LIPASE 23   No results for input(s): "AMMONIA" in the last 168 hours. Coagulation Profile: Recent Labs  Lab 02/19/24 0420  INR 1.2   CBC: Recent Labs  Lab 02/19/24 0420  WBC 10.1  HGB 6.2*  HCT 19.5*  MCV 93.8  PLT 243   Cardiac Enzymes: No results for input(s): "CKTOTAL", "CKMB",  "CKMBINDEX", "TROPONINI" in the last 168 hours. BNP: Invalid input(s): "POCBNP" CBG: No results for input(s): "GLUCAP" in the last 168 hours. HbA1C: No results for input(s): "HGBA1C" in the last 72 hours. Urine analysis:    Component Value Date/Time   COLORURINE YELLOW 10/14/2023 1117   APPEARANCEUR CLEAR 10/14/2023 1117   LABSPEC 1.016 10/14/2023 1117   PHURINE 5.0 10/14/2023 1117   GLUCOSEU NEGATIVE 10/14/2023 1117   HGBUR NEGATIVE 10/14/2023 1117   BILIRUBINUR NEGATIVE 10/14/2023 1117   KETONESUR NEGATIVE 10/14/2023 1117   PROTEINUR NEGATIVE 10/14/2023 1117   UROBILINOGEN 0.2 09/10/2011 2155   NITRITE NEGATIVE 10/14/2023 1117   LEUKOCYTESUR NEGATIVE 10/14/2023 1117   Sepsis Labs: @LABRCNTIP (procalcitonin:4,lacticidven:4) )No results found for this or any previous visit (from the past 240 hours).   Scheduled Meds:  busPIRone   5 mg Oral BID   donepezil   5 mg Oral QHS   ezetimibe   10 mg Oral Daily   gabapentin   300 mg Oral TID   metoprolol  tartrate  25 mg Oral BID   QUEtiapine   100 mg Oral BID   sertraline  75 mg Oral Daily   Continuous Infusions:  Procedures/Studies: DG Chest Port 1 View Result Date: 02/19/2024 CLINICAL DATA:  89 year old female with epigastric pain. EXAM: PORTABLE CHEST 1 VIEW COMPARISON:  Portable chest 02/28/2023 and earlier. FINDINGS: Portable AP upright view at 0437 hours. Improved lung volumes from last year. Heart size stable and within normal limits. Calcified aortic atherosclerosis. Other mediastinal contours are within normal limits. Visualized tracheal air column is within normal limits. Allowing for portable technique the lungs are clear. No pneumothorax or pleural effusion. Chronic right shoulder arthroplasty. Severe chronic left shoulder degeneration. No acute osseous abnormality identified. Paucity of bowel gas in the visible abdomen. No evidence of pneumoperitoneum. IMPRESSION: No acute cardiopulmonary abnormality. Electronically Signed   By:  Marlise Simpers M.D.   On: 02/19/2024 04:59    Demaris Fillers, DO  [facility]  If 7PM-7AM, please contact night-coverage [email or web address] Password [email or web address] 02/19/2024, 7:10 AM   LOS: 0 days

## 2024-02-19 NOTE — TOC Initial Note (Signed)
 Transition of Care [facility]) - Initial/Assessment Note    Patient Details  Name: [name] MRN: [id number] Date of Birth: [birth year]  [facility] CM/SW Contact:    Grandville Lax, LCSWA Phone Number: 02/19/2024, 11:53 AM  Clinical Narrative:                 Pt is high risk for readmission. CSW notes per chart review that pt arrived from [facility]. CSW spoke to Bambi Lever in admissions who states that pt is a long term resident and can return once medically stable. CSW confirms with facility that pt will not need new auth to return as pt is LTC. TOC to follow.   Expected Discharge Plan: Long Term Nursing Home Barriers to Discharge: Continued Medical Work up   Patient Goals and CMS Choice Patient states their goals for this hospitalization and ongoing recovery are:: return to LTC CMS Medicare.gov Compare Post Acute Care list provided to:: Patient Represenative (must comment) Choice offered to / list presented to : Adult Children      Expected Discharge Plan and Services In-house Referral: Clinical Social Work Discharge Planning Services: CM Consult Post Acute Care Choice: Nursing Home Living arrangements for the past 2 months: Skilled Nursing Facility                                      Prior Living Arrangements/Services Living arrangements for the past 2 months: Skilled Nursing Facility Lives with:: Facility Resident Patient language and need for interpreter reviewed:: Yes Do you feel safe going back to the place where you live?: Yes      Need for Family Participation in Patient Care: No (Comment) Care giver support system in place?: No (comment)   Criminal Activity/Legal Involvement Pertinent to Current Situation/Hospitalization: No - Comment as needed  Activities of Daily Living   ADL Screening (condition at time of admission) Independently performs ADLs?: No Does the patient have a NEW difficulty with bathing/dressing/toileting/self-feeding  that is expected to last >3 days?: Yes (Initiates electronic notice to provider for possible OT consult) Does the patient have a NEW difficulty with getting in/out of bed, walking, or climbing stairs that is expected to last >3 days?: Yes (Initiates electronic notice to provider for possible PT consult) Does the patient have a NEW difficulty with communication that is expected to last >3 days?: No Is the patient deaf or have difficulty hearing?: Yes Does the patient have difficulty seeing, even when wearing glasses/contacts?: Yes Does the patient have difficulty concentrating, remembering, or making decisions?: No  Permission Sought/Granted                  Emotional Assessment Appearance:: Appears stated age       Alcohol / Substance Use: Not Applicable Psych Involvement: No (comment)  Admission diagnosis:  Lower GI bleed [K92.2] Symptomatic anemia [D64.9] Patient Active Problem List   Diagnosis Date Noted   Lower GI bleed 02/19/2024   Hematochezia 02/19/2024   ABLA (acute blood loss anemia) 02/19/2024   PAF (paroxysmal atrial fibrillation) (HCC) 02/19/2024   AKI (acute kidney injury) (HCC) 10/14/2023   Generalized weakness 03/05/2023   Pressure injury of skin 03/05/2023   Acute on chronic renal failure (HCC) 02/28/2023   OA (osteoarthritis) 02/28/2023   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   CVA (cerebral vascular accident) (HCC) 02/28/2023   Mixed hyperlipidemia 02/28/2023   Periprosthetic fracture of patella  09/21/2022   Peri-prosthetic patellar fracture 09/19/2022   S/P total knee arthroplasty, left 09/03/2022   Degenerative arthritis of left knee 01/18/2021   S/P reverse total shoulder arthroplasty, 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   Primary osteoarthritis of right hip 01/20/2018   Osteoarthritis of right knee 01/18/2018   Chronic pain syndrome    Anxiety    Right hip pain 11/29/2017   CKD (chronic kidney disease) stage 2, GFR 60-89 ml/min 11/29/2017   Prosthetic joint  infection (HCC)    Renal failure 05/27/2012   HTN (hypertension) 10/17/2011   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    Class: Acute   PCP:  Corrington, Kip A, MD Pharmacy:   [phone] - [place], [place] - [address] [place] [address] Phone: [phone] Fax: [phone]  [facility] - [place], [place] - [address] [place] [address] Phone: [phone] Fax: [phone]  [facility] - [place], [place] - [address] [place] [address] Phone: [phone] Fax: [phone]     Social Drivers of Health (SDOH) Social History: SDOH Screenings   Food Insecurity: No Food Insecurity (02/19/2024)  Housing: Low Risk  (02/19/2024)  Transportation Needs: No Transportation Needs (02/19/2024)  Utilities: Not At Risk (02/19/2024)  Financial Resource Strain: Low Risk  (03/28/2023)   Received from [facility], [facility]  Physical Activity: Insufficiently Active (02/05/2023)   Received [facility], [facility]  Social Connections: Unknown (02/19/2024)  Stress: Stress Concern Present (02/05/2023)   Received from [facility], [facility]  Tobacco Use: Medium Risk (02/19/2024)   SDOH Interventions:     Readmission Risk Interventions    02/19/2024   11:52 AM 09/25/2022    9:57 AM  Readmission Risk Prevention Plan  Post Dischage Appt  Complete  Medication Screening  Complete  Transportation Screening Complete Complete  Social Work Consult for Recovery Care Planning/Counseling Complete   Palliative Care Screening Not Applicable   Medication Review Oceanographer) Complete

## 2024-02-20 DIAGNOSIS — Z7901 Long term (current) use of anticoagulants: Secondary | ICD-10-CM | POA: Diagnosis not present

## 2024-02-20 DIAGNOSIS — K922 Gastrointestinal hemorrhage, unspecified: Secondary | ICD-10-CM | POA: Diagnosis not present

## 2024-02-20 DIAGNOSIS — D62 Acute posthemorrhagic anemia: Secondary | ICD-10-CM | POA: Diagnosis not present

## 2024-02-20 DIAGNOSIS — D649 Anemia, unspecified: Secondary | ICD-10-CM

## 2024-02-20 DIAGNOSIS — K625 Hemorrhage of anus and rectum: Secondary | ICD-10-CM | POA: Diagnosis not present

## 2024-02-20 DIAGNOSIS — I480 Paroxysmal atrial fibrillation: Secondary | ICD-10-CM | POA: Diagnosis not present

## 2024-02-20 DIAGNOSIS — K921 Melena: Secondary | ICD-10-CM | POA: Diagnosis not present

## 2024-02-20 LAB — CBC WITH DIFFERENTIAL/PLATELET
Abs Immature Granulocytes: 0.13 10*3/uL — ABNORMAL HIGH (ref 0.00–0.07)
Basophils Absolute: 0 10*3/uL (ref 0.0–0.1)
Basophils Relative: 1 %
Eosinophils Absolute: 0.2 10*3/uL (ref 0.0–0.5)
Eosinophils Relative: 2 %
HCT: 28.9 % — ABNORMAL LOW (ref 36.0–46.0)
Hemoglobin: 9.5 g/dL — ABNORMAL LOW (ref 12.0–15.0)
Immature Granulocytes: 2 %
Lymphocytes Relative: 27 %
Lymphs Abs: 2.1 10*3/uL (ref 0.7–4.0)
MCH: 30.4 pg (ref 26.0–34.0)
MCHC: 32.9 g/dL (ref 30.0–36.0)
MCV: 92.6 fL (ref 80.0–100.0)
Monocytes Absolute: 0.6 10*3/uL (ref 0.1–1.0)
Monocytes Relative: 7 %
Neutro Abs: 5 10*3/uL (ref 1.7–7.7)
Neutrophils Relative %: 61 %
Platelets: 201 10*3/uL (ref 150–400)
RBC: 3.12 MIL/uL — ABNORMAL LOW (ref 3.87–5.11)
RDW: 15.1 % (ref 11.5–15.5)
WBC: 8 10*3/uL (ref 4.0–10.5)
nRBC: 0.4 % — ABNORMAL HIGH (ref 0.0–0.2)

## 2024-02-20 LAB — BASIC METABOLIC PANEL WITH GFR
Anion gap: 5 (ref 5–15)
BUN: 44 mg/dL — ABNORMAL HIGH (ref 8–23)
CO2: 23 mmol/L (ref 22–32)
Calcium: 8.1 mg/dL — ABNORMAL LOW (ref 8.9–10.3)
Chloride: 112 mmol/L — ABNORMAL HIGH (ref 98–111)
Creatinine, Ser: 1.18 mg/dL — ABNORMAL HIGH (ref 0.44–1.00)
GFR, Estimated: 44 mL/min — ABNORMAL LOW (ref >60)
Glucose, Bld: 147 mg/dL — ABNORMAL HIGH (ref 70–99)
Potassium: 4.5 mmol/L (ref 3.5–5.1)
Sodium: 140 mmol/L (ref 135–145)

## 2024-02-20 LAB — CBC
HCT: 22 % — ABNORMAL LOW (ref 36.0–46.0)
Hemoglobin: 7 g/dL — ABNORMAL LOW (ref 12.0–15.0)
MCH: 29.7 pg (ref 26.0–34.0)
MCHC: 31.8 g/dL (ref 30.0–36.0)
MCV: 93.2 fL (ref 80.0–100.0)
Platelets: 145 10*3/uL — ABNORMAL LOW (ref 150–400)
RBC: 2.36 MIL/uL — ABNORMAL LOW (ref 3.87–5.11)
RDW: 15.2 % (ref 11.5–15.5)
WBC: 6.4 10*3/uL (ref 4.0–10.5)
nRBC: 0 % (ref 0.0–0.2)

## 2024-02-20 LAB — PREPARE RBC (CROSSMATCH)

## 2024-02-20 MED ORDER — PANTOPRAZOLE SODIUM 40 MG PO TBEC
40.0000 mg | DELAYED_RELEASE_TABLET | Freq: Two times a day (BID) | ORAL | Status: DC
  Administered 2024-02-20 – 2024-02-22 (×4): 40 mg via ORAL
  Filled 2024-02-20 (×4): qty 1

## 2024-02-20 MED ORDER — PEG 3350-KCL-NA BICARB-NACL 420 G PO SOLR
4000.0000 mL | Freq: Once | ORAL | Status: AC
  Administered 2024-02-20: 4000 mL via ORAL

## 2024-02-20 MED ORDER — BISACODYL 5 MG PO TBEC
10.0000 mg | DELAYED_RELEASE_TABLET | Freq: Once | ORAL | Status: AC
  Administered 2024-02-20: 10 mg via ORAL
  Filled 2024-02-20: qty 2

## 2024-02-20 MED ORDER — LACTATED RINGERS IV SOLN: INTRAVENOUS | Status: DC

## 2024-02-20 MED ORDER — SODIUM CHLORIDE 0.9 % IV SOLN: INTRAVENOUS | Status: DC

## 2024-02-20 MED ORDER — SODIUM CHLORIDE 0.9% IV SOLUTION: Freq: Once | INTRAVENOUS | Status: AC

## 2024-02-20 NOTE — Progress Notes (Signed)
 Gastroenterology Progress Note   Referring Provider: No ref. provider found Primary Care Physician:  Corrington, Kip A, MD Primary Gastroenterologist:  Kristine Cress, MD Patient ID: [name]; [id number]; Sep 24, 1932   Subjective:    No complaints this morning. Denies any BM in 24 hours. No abdominal pain. Tolerating her clear liquid diet.   Objective:   Vital signs in last 24 hours: Temp:  [97.9 F (36.6 C)-99.5 F (37.5 C)] 98.1 F (36.7 C) (05/29 0310) Pulse Rate:  [97-109] 104 (05/29 0310) Resp:  [18-20] 18 (05/29 0310) BP: (133-181)/(50-93) 158/93 (05/29 0310) SpO2:  [94 %-98 %] 95 % (05/29 0310) Last BM Date : 02/18/24 General:   Alert,  Well-developed, well-nourished, pleasant and cooperative in NAD Head:  Normocephalic and atraumatic. Eyes:  Sclera clear, no icterus.   Abdomen:  Soft, nontender and nondistended.  Normal bowel sounds, without guarding, and without rebound.   Extremities:  Without clubbing, deformity or edema. Neurologic:  Alert and  oriented x4;  grossly normal neurologically. Skin:  Intact without significant lesions or rashes. Psych:  Alert and cooperative. Normal mood and affect.  Intake/Output from previous day: 05/28 0701 - 05/29 0700 In: 913.3 [P.O.:300; I.V.:10.8; Blood:602.5] Out: -  Intake/Output this shift: No intake/output data recorded.  Lab Results: CBC Recent Labs    02/19/24 0420 02/20/24 0441  WBC 10.1 6.4  HGB 6.2* 7.0*  HCT 19.5* 22.0*  MCV 93.8 93.2  PLT 243 145*   BMET Recent Labs    02/19/24 0420 02/20/24 0441  NA 139 140  K 5.2* 4.5  CL 110 112*  CO2 20* 23  GLUCOSE 271* 147*  BUN 63* 44*  CREATININE 1.43* 1.18*  CALCIUM  8.0* 8.1*   LFTs Recent Labs    02/19/24 0420  BILITOT 0.4  ALKPHOS 30*  AST 13*  ALT 11  PROT 5.1*  ALBUMIN 2.7*   Recent Labs    02/19/24 0420  LIPASE 23   PT/INR Recent Labs    02/19/24 0420  LABPROT 15.8*  INR 1.2         Imaging Studies: DG Chest  Port 1 View Result Date: 02/19/2024 CLINICAL DATA:  89 year old female with epigastric pain. EXAM: PORTABLE CHEST 1 VIEW COMPARISON:  Portable chest 02/28/2023 and earlier. FINDINGS: Portable AP upright view at 0437 hours. Improved lung volumes from last year. Heart size stable and within normal limits. Calcified aortic atherosclerosis. Other mediastinal contours are within normal limits. Visualized tracheal air column is within normal limits. Allowing for portable technique the lungs are clear. No pneumothorax or pleural effusion. Chronic right shoulder arthroplasty. Severe chronic left shoulder degeneration. No acute osseous abnormality identified. Paucity of bowel gas in the visible abdomen. No evidence of pneumoperitoneum. IMPRESSION: No acute cardiopulmonary abnormality. Electronically Signed   By: Marlise Simpers M.D.   On: 02/19/2024 04:59  [2 weeks]  Assessment:   [name] is [name] 89 y.o. year old female  with history of CKD, DM, HTN, [name] fib on eliquis who presented to the ED this morning with c/o rectal bleeding since Monday, hgb on admission was 6.2, GI consulted for further evaluation.   Rectal bleeding and normocytic anemia:   -baseline Hgb 9-11 range over the past year -had acute drop in Hgb in January after fall with hematoma formation but no blood transfusion at that time. Hgb 7.4 at discharge. No interval Hgb's available. -Hgb 6.2 on admission, 2 units PRBCs given, with Hgb this morning 7.1. -reported 2 episodes of hematochezia, 1  Monday and 1 Tuesday  -denies any other GI symptoms -no prior colonoscopy she can recall -did have rectal bleeding about 1 year ago though patient nor family are sure of etiology/workup for this -differentials include diverticular bleed, bleeding polyps, AVMs, malignancy.   Last Eliquis dose: patient thinks possibly night of 5/27.  Multiple attempts to contact her SNF ([facility]) to confirm timing of her last AC dose, but was unable to reach anyone.      Discussed colonoscopy with the patient especially given her advanced age, however, she is amenable to proceeding with this for further evaluation.  Indications, risks and benefits of procedure discussed in detail with patient. Patient verbalized understanding and is in agreement to proceed with Colonoscopy.        Plan:   Hold Eliquis. PPI BID. Agree with 3rd unit of prbcs this morning.  Trend H/H and transfuse if Hgb <7. Clear liquid diet, NPO at midnight. Colonoscopy tomorrow.     LOS: 1 day   [name]. [name] [facility] [phone] 5/29/20258:21 AM

## 2024-02-20 NOTE — Progress Notes (Signed)
   02/20/24 1959  Assess: MEWS Score  Temp 98.2 F (36.8 C)  BP (!) 201/73  MAP (mmHg) 109  Pulse Rate (!) 105  Resp 20  SpO2 99 %  O2 Device Room Air  Assess: MEWS Score  MEWS Temp 0  MEWS Systolic 2  MEWS Pulse 1  MEWS RR 0  MEWS LOC 0  MEWS Score 3  MEWS Score Color Yellow  Assess: if the MEWS score is Yellow or Red  Were vital signs accurate and taken at a resting state? Yes  Does the patient meet 2 or more of the SIRS criteria? No  MEWS guidelines implemented  Yes, yellow  Treat  MEWS Interventions Considered administering scheduled or prn medications/treatments as ordered  Take Vital Signs  Increase Vital Sign Frequency  Yellow: Q2hr x1, continue Q4hrs until patient remains green for 12hrs  Escalate  MEWS: Escalate Yellow: Discuss with charge nurse and consider notifying provider and/or RRT  Notify: Charge Nurse/RN  Name of Charge Nurse/RN Notified Derrel Flies  Provider Notification  Provider Name/Title Dr. Elyse Hand  Date Provider Notified 02/20/24  Time Provider Notified 2001  Method of Notification Page  Notification Reason Other (Comment) (yellow mews)  Provider response Other (Comment) (awaiting orders)  Assess: SIRS CRITERIA  SIRS Temperature  0  SIRS Respirations  0  SIRS Pulse 1  SIRS WBC 0  SIRS Score Sum  1

## 2024-02-20 NOTE — Progress Notes (Signed)
 Prep is finished and has been on [facility] with lots of bloody output.  BSC emptied twice and each time about half full of moroon colored output with small clots.

## 2024-02-20 NOTE — Plan of Care (Signed)
   Problem: Education: Goal: Knowledge of General Education information will improve Description Including pain rating scale, medication(s)/side effects and non-pharmacologic comfort measures Outcome: Progressing   Problem: Health Behavior/Discharge Planning: Goal: Ability to manage health-related needs will improve Outcome: Progressing

## 2024-02-20 NOTE — Progress Notes (Signed)
 PROGRESS NOTE  [name] ZOX:[id number] DOB: 1931/11/04 DOA: 02/19/2024 PCP: Chandler Combs, MD  Brief History:  89 year old female with a history of hypertension, diabetes mellitus type 2, hyperlipidemia, CKD stage IIIb, paroxysmal atrial fibrillation, stroke, and chronic toe impairment presenting with hematochezia.  The patient is a poor historian secondary to her cognitive impairment.  History is obtained from review of the medical record and speaking with the patient.  The patient states that she went to use the commode and after trying to get up she was feeling some generalized weakness and some cramping in the upper abdomen.  Staff noted the patient to have some blood in the commode, and the patient was feeling clammy.  As result, the patient was transferred to the emergency department for further evaluation and treatment. In the ED, the patient was afebrile and hemodynamically stable with oxygen  saturation 96% on room air.  WBC 10.1, hemoglobin 6.2, platelet 243.  Sodium 139, potassium 5.2, bicarbonate 20, serum creatinine 1.43.  LFTs were unremarkable.  2 units PRBC were ordered.     Assessment/Plan: Hematochezia/acute blood loss anemia - GI consult appreciated - Baseline hemoglobin 9-10 - continues to have hematochezia during the hospitalization - Transfused 3 units PRBC this admission - Clear liquid diet - No previously documented EGD or colonoscopy - plan for colonoscopy 5/30 to allow for apixaban washout   Essential hypertension - Continue metoprolol  tartrate - Holding hydralazine , clonidine , and amlodipine  secondary to soft blood pressure initially   Paroxysmal atrial fibrillation - Currently in sinus rhythm - Previously on apixaban - Patient had admission 10/14/2023 1o 10/17/23 for right flank hematoma after a fall   Cognitive impairment - Continue Aricept  - Continue Seroquel  - Continue sertraline   Depression/anxiety - Continue buspirone  and  sertraline                   Family Communication:  no Family at bedside   Consultants:  GI   Code Status:   DNR   DVT Prophylaxis:  SCDs     Procedures: As Listed in Progress Note Above   Antibiotics: None           Subjective: Patient denies fevers, chills, headache, chest pain, dyspnea, nausea, vomiting, diarrhea, abdominal pain, dysuria, hematuria, hematochezia, and melena.   Objective: Vitals:   02/20/24 1022 02/20/24 1240 02/20/24 1329 02/20/24 1402  BP: (!) 162/75 (!) 165/79 (!) 144/71 (!) 155/59  Pulse: (!) 108 (!) 105 100 96  Resp: 16 16 16 20   Temp: 98.5 F (36.9 C) 98.5 F (36.9 C) 98.3 F (36.8 C) 97.9 F (36.6 C)  TempSrc: Oral   Oral  SpO2: 98% 98% 97% 97%  Weight:      Height:        Intake/Output Summary (Last 24 hours) at 02/20/2024 1708 Last data filed at 02/20/2024 1455 Gross per 24 hour  Intake --  Output 500 ml  Net -500 ml   Weight change:  Exam:  General:  Pt is alert, follows commands appropriately, not in acute distress HEENT: No icterus, No thrush, No neck mass, [place]/AT Cardiovascular: RRR, S1/S2, no rubs, no gallops Respiratory: CTA bilaterally, no wheezing, no crackles, no rhonchi Abdomen: Soft/+BS, non tender, non distended, no guarding Extremities: No edema, No lymphangitis, No petechiae, No rashes, no synovitis   Data Reviewed: I have personally reviewed following labs and imaging studies Basic Metabolic Panel: Recent Labs  Lab 02/19/24 0420 02/20/24 0441  NA 139 140  K 5.2* 4.5  CL 110 112*  CO2 20* 23  GLUCOSE 271* 147*  BUN 63* 44*  CREATININE 1.43* 1.18*  CALCIUM  8.0* 8.1*   Liver Function Tests: Recent Labs  Lab 02/19/24 0420  AST 13*  ALT 11  ALKPHOS 30*  BILITOT 0.4  PROT 5.1*  ALBUMIN 2.7*   Recent Labs  Lab 02/19/24 0420  LIPASE 23   No results for input(s): "AMMONIA" in the last 168 hours. Coagulation Profile: Recent Labs  Lab 02/19/24 0420  INR 1.2   CBC: Recent Labs   Lab 02/19/24 0420 02/20/24 0441 02/20/24 1542  WBC 10.1 6.4 8.0  NEUTROABS  --   --  5.0  HGB 6.2* 7.0* 9.5*  HCT 19.5* 22.0* 28.9*  MCV 93.8 93.2 92.6  PLT 243 145* 201   Cardiac Enzymes: No results for input(s): "CKTOTAL", "CKMB", "CKMBINDEX", "TROPONINI" in the last 168 hours. BNP: Invalid input(s): "POCBNP" CBG: No results for input(s): "GLUCAP" in the last 168 hours. HbA1C: No results for input(s): "HGBA1C" in the last 72 hours. Urine analysis:    Component Value Date/Time   COLORURINE YELLOW 10/14/2023 1117   APPEARANCEUR CLEAR 10/14/2023 1117   LABSPEC 1.016 10/14/2023 1117   PHURINE 5.0 10/14/2023 1117   GLUCOSEU NEGATIVE 10/14/2023 1117   HGBUR NEGATIVE 10/14/2023 1117   BILIRUBINUR NEGATIVE 10/14/2023 1117   KETONESUR NEGATIVE 10/14/2023 1117   PROTEINUR NEGATIVE 10/14/2023 1117   UROBILINOGEN 0.2 09/10/2011 2155   NITRITE NEGATIVE 10/14/2023 1117   LEUKOCYTESUR NEGATIVE 10/14/2023 1117   Sepsis Labs: @LABRCNTIP (procalcitonin:4,lacticidven:4) )No results found for this or any previous visit (from the past 240 hours).   Scheduled Meds:  busPIRone   5 mg Oral BID   donepezil   5 mg Oral QHS   ezetimibe   10 mg Oral Daily   gabapentin   300 mg Oral TID   metoprolol  tartrate  25 mg Oral BID   pantoprazole   40 mg Oral BID   QUEtiapine   100 mg Oral BID   sertraline  75 mg Oral Daily   Continuous Infusions:  sodium chloride       Procedures/Studies: DG Chest Port 1 View Result Date: 02/19/2024 CLINICAL DATA:  89 year old female with epigastric pain. EXAM: PORTABLE CHEST 1 VIEW COMPARISON:  Portable chest 02/28/2023 and earlier. FINDINGS: Portable AP upright view at 0437 hours. Improved lung volumes from last year. Heart size stable and within normal limits. Calcified aortic atherosclerosis. Other mediastinal contours are within normal limits. Visualized tracheal air column is within normal limits. Allowing for portable technique the lungs are clear. No  pneumothorax or pleural effusion. Chronic right shoulder arthroplasty. Severe chronic left shoulder degeneration. No acute osseous abnormality identified. Paucity of bowel gas in the visible abdomen. No evidence of pneumoperitoneum. IMPRESSION: No acute cardiopulmonary abnormality. Electronically Signed   By: Marlise Simpers M.D.   On: 02/19/2024 04:59    Demaris Fillers, DO  [facility]  If 7PM-7AM, please contact night-coverage [email or web address] Password [email or web address] 02/20/2024, 5:08 PM   LOS: 1 day

## 2024-02-20 NOTE — Progress Notes (Signed)
 Completed 1 unit prbc with no adverse reaction.  Repeat labs drawn.  Had large amount of blood from rectum in toilet earlier. Vitals stable.  Contacted Dr. Winferd Hatter and Azalee Lenz.  Consent signed for colonoscopy tomorrow and drinking prep now.  No more bloody discharge and has been to toilet several times for voiding.

## 2024-02-21 ENCOUNTER — Inpatient Hospital Stay (HOSPITAL_COMMUNITY): Admitting: Anesthesiology

## 2024-02-21 ENCOUNTER — Encounter (HOSPITAL_COMMUNITY): Payer: Self-pay | Admitting: Internal Medicine

## 2024-02-21 ENCOUNTER — Encounter (HOSPITAL_COMMUNITY)
Admission: EM | Disposition: A | Discharge status: Skilled Nursing Facility | Payer: Self-pay | Source: Skilled Nursing Facility | Attending: Internal Medicine

## 2024-02-21 DIAGNOSIS — D120 Benign neoplasm of cecum: Secondary | ICD-10-CM | POA: Diagnosis not present

## 2024-02-21 DIAGNOSIS — K625 Hemorrhage of anus and rectum: Secondary | ICD-10-CM | POA: Diagnosis not present

## 2024-02-21 DIAGNOSIS — K922 Gastrointestinal hemorrhage, unspecified: Secondary | ICD-10-CM | POA: Diagnosis not present

## 2024-02-21 DIAGNOSIS — D124 Benign neoplasm of descending colon: Secondary | ICD-10-CM

## 2024-02-21 DIAGNOSIS — I10 Essential (primary) hypertension: Secondary | ICD-10-CM

## 2024-02-21 DIAGNOSIS — D125 Benign neoplasm of sigmoid colon: Secondary | ICD-10-CM

## 2024-02-21 DIAGNOSIS — K648 Other hemorrhoids: Secondary | ICD-10-CM

## 2024-02-21 DIAGNOSIS — D123 Benign neoplasm of transverse colon: Secondary | ICD-10-CM

## 2024-02-21 DIAGNOSIS — D122 Benign neoplasm of ascending colon: Secondary | ICD-10-CM

## 2024-02-21 DIAGNOSIS — I480 Paroxysmal atrial fibrillation: Secondary | ICD-10-CM | POA: Diagnosis not present

## 2024-02-21 DIAGNOSIS — D649 Anemia, unspecified: Secondary | ICD-10-CM | POA: Diagnosis not present

## 2024-02-21 HISTORY — PX: COLONOSCOPY: SHX5424

## 2024-02-21 LAB — BPAM RBC
Blood Product Expiration Date: 202506272359
Blood Product Expiration Date: 202506272359
Blood Product Expiration Date: 202506272359
ISSUE DATE / TIME: 202505280544
ISSUE DATE / TIME: 202505280814
ISSUE DATE / TIME: 202505291003
Unit Type and Rh: 6200
Unit Type and Rh: 6200
Unit Type and Rh: 6200

## 2024-02-21 LAB — CBC WITH DIFFERENTIAL/PLATELET
Abs Immature Granulocytes: 0.11 10*3/uL — ABNORMAL HIGH (ref 0.00–0.07)
Basophils Absolute: 0 10*3/uL (ref 0.0–0.1)
Basophils Relative: 1 %
Eosinophils Absolute: 0.1 10*3/uL (ref 0.0–0.5)
Eosinophils Relative: 2 %
HCT: 25.5 % — ABNORMAL LOW (ref 36.0–46.0)
Hemoglobin: 8.2 g/dL — ABNORMAL LOW (ref 12.0–15.0)
Immature Granulocytes: 2 %
Lymphocytes Relative: 20 %
Lymphs Abs: 1.3 10*3/uL (ref 0.7–4.0)
MCH: 30.7 pg (ref 26.0–34.0)
MCHC: 32.2 g/dL (ref 30.0–36.0)
MCV: 95.5 fL (ref 80.0–100.0)
Monocytes Absolute: 0.6 10*3/uL (ref 0.1–1.0)
Monocytes Relative: 10 %
Neutro Abs: 4.4 10*3/uL (ref 1.7–7.7)
Neutrophils Relative %: 65 %
Platelets: 174 10*3/uL (ref 150–400)
RBC: 2.67 MIL/uL — ABNORMAL LOW (ref 3.87–5.11)
RDW: 15.2 % (ref 11.5–15.5)
WBC: 6.6 10*3/uL (ref 4.0–10.5)
nRBC: 0 % (ref 0.0–0.2)

## 2024-02-21 LAB — GLUCOSE, CAPILLARY: Glucose-Capillary: 113 mg/dL — ABNORMAL HIGH (ref 70–99)

## 2024-02-21 LAB — TYPE AND SCREEN
ABO/RH(D): A POS
Antibody Screen: NEGATIVE
Unit division: 0
Unit division: 0
Unit division: 0

## 2024-02-21 SURGERY — COLONOSCOPY
Anesthesia: General

## 2024-02-21 MED ORDER — PROPOFOL 10 MG/ML IV BOLUS
INTRAVENOUS | Status: DC | PRN
  Administered 2024-02-21: 50 mg via INTRAVENOUS
  Administered 2024-02-21: 25 mg via INTRAVENOUS

## 2024-02-21 MED ORDER — LACTATED RINGERS IV SOLN: INTRAVENOUS | Status: DC | PRN

## 2024-02-21 MED ORDER — LACTATED RINGERS IV SOLN: INTRAVENOUS | Status: DC

## 2024-02-21 MED ORDER — PROPOFOL 500 MG/50ML IV EMUL
INTRAVENOUS | Status: DC | PRN
  Administered 2024-02-21: 150 ug/kg/min via INTRAVENOUS

## 2024-02-21 NOTE — Op Note (Signed)
 [facility] Patient Name: [name] Procedure Date: 02/21/2024 2:45 PM MRN: [id number] Date of Birth: 1932-09-14 Attending MD: Samantha Cress , , [id number] CSN: [id number] Age: 89 Admit Type: Inpatient Procedure:                Colonoscopy Indications:              Rectal bleeding Providers:                Samantha Cress, Willena Harp Annell Barrow Referring MD:              Medicines:                Monitored Anesthesia Care Complications:            No immediate complications. Estimated Blood Loss:     Estimated blood loss: none. Procedure:                Pre-Anesthesia Assessment:                           - Prior to the procedure, a History and Physical                            was performed, and patient medications, allergies                            and sensitivities were reviewed. The patient's                            tolerance of previous anesthesia was reviewed.                           - The risks and benefits of the procedure and the                            sedation options and risks were discussed with the                            patient. All questions were answered and informed                            consent was obtained.                           - ASA Grade Assessment: II - A patient with mild                            systemic disease.                           After obtaining informed consent, the colonoscope                            was passed under direct vision. Throughout the  procedure, the patient's blood pressure, pulse, and                            oxygen  saturations were monitored continuously. The                            [id number]-HQ190L ([id number]) scope was introduced through                            the anus and advanced to the the cecum, identified                            by appendiceal orifice and ileocecal valve. The                             colonoscopy was performed without difficulty. The                            patient tolerated the procedure well. The quality                            of the bowel preparation was adequate. Scope In: 3:05:00 PM Scope Out: 3:44:48 PM Scope Withdrawal Time: 0 hours 27 minutes 1 second  Total Procedure Duration: 0 hours 39 minutes 48 seconds  Findings:      The perianal and digital rectal examinations were normal.      Eleven sessile polyps were found in the transverse colon, ascending       colon and cecum. The polyps were 4 to 15 mm in size. These polyps were       removed with a cold snare. Resection and retrieval were complete.      Three sessile polyps were found in the sigmoid colon and descending       colon. The polyps were 3 to 6 mm in size. These polyps were removed with       a cold snare. Resection and retrieval were complete.      Non-bleeding internal hemorrhoids were found during retroflexion. The       hemorrhoids were small.      Note: bleeding possibly related to diverticular bleeding, resolved. Impression:               - Eleven 4 to 15 mm polyps in the transverse colon,                            in the ascending colon and in the cecum, removed                            with a cold snare. Resected and retrieved.                           - Three 3 to 6 mm polyps in the sigmoid colon and                            in the descending colon, removed with a cold snare.  Resected and retrieved.                           - Non-bleeding internal hemorrhoids. Moderate Sedation:      Per Anesthesia Care Recommendation:           - Return patient to hospital ward for ongoing care.                           - Resume previous diet.                           - Await pathology results.                           - Repeat colonoscopy is not recommended due to                            current age (71 years or older) for screening                             purposes.                           - Restart anticoagulation in 2 days. Procedure Code(s):        --- Professional ---                           [phone], Colonoscopy, flexible; with removal of                            tumor(s), polyp(s), or other lesion(s) by snare                            technique Diagnosis Code(s):        --- Professional ---                           D12.3, Benign neoplasm of transverse colon (hepatic                            flexure or splenic flexure)                           D12.2, Benign neoplasm of ascending colon                           D12.0, Benign neoplasm of cecum                           D12.5, Benign neoplasm of sigmoid colon                           D12.4, Benign neoplasm of descending colon                           K64.8, Other hemorrhoids  K62.5, Hemorrhage of anus and rectum CPT copyright 2022 American Medical Association. All rights reserved. The codes documented in this report are preliminary and upon coder review may  be revised to meet current compliance requirements. Samantha Cress, MD Samantha Cress,  02/21/2024 4:04:20 PM This report has been signed electronically. Number of Addenda: 0

## 2024-02-21 NOTE — Anesthesia Preprocedure Evaluation (Signed)
 Anesthesia Evaluation  Patient identified by MRN, date of birth, ID band Patient awake    Reviewed: Allergy & Precautions, H&P , NPO status , Patient's Chart, lab work & pertinent test results, reviewed documented beta blocker date and time   Airway Mallampati: II  TM Distance: >3 FB Neck ROM: full    Dental no notable dental hx.    Pulmonary neg pulmonary ROS, asthma , pneumonia, former smoker   Pulmonary exam normal breath sounds clear to auscultation       Cardiovascular Exercise Tolerance: Good hypertension, negative cardio ROS + dysrhythmias  Rhythm:regular Rate:Normal     Neuro/Psych  PSYCHIATRIC DISORDERS Anxiety Depression    TIACVA negative neurological ROS  negative psych ROS   GI/Hepatic negative GI ROS, Neg liver ROS,GERD  ,,  Endo/Other  negative endocrine ROSdiabetes    Renal/GU Renal diseasenegative Renal ROS  negative genitourinary   Musculoskeletal   Abdominal   Peds  Hematology negative hematology ROS (+) Blood dyscrasia, anemia   Anesthesia Other Findings   Reproductive/Obstetrics negative OB ROS                             Anesthesia Physical Anesthesia Plan  ASA: 3 and emergent  Anesthesia Plan: General   Post-op Pain Management:    Induction:   PONV Risk Score and Plan: Propofol  infusion  Airway Management Planned:   Additional Equipment:   Intra-op Plan:   Post-operative Plan:   Informed Consent: I have reviewed the patients History and Physical, chart, labs and discussed the procedure including the risks, benefits and alternatives for the proposed anesthesia with the patient or authorized representative who has indicated his/her understanding and acceptance.     Dental Advisory Given  Plan Discussed with: CRNA  Anesthesia Plan Comments:        Anesthesia Quick Evaluation

## 2024-02-21 NOTE — Brief Op Note (Signed)
 02/21/2024  4:03 PM  PATIENT:  [name]  89 y.o. female  PRE-OPERATIVE DIAGNOSIS:  rectal bleeding, anemia  POST-OPERATIVE DIAGNOSIS:  transverse colon polyp (CS), cecal polyp x 2 (CS), ascending colon polyp x 6 (CS), descending colon polyp x 2(CS), sigmoid colon polyp x 1 (CS), hemorrhoids  PROCEDURE:  Procedure(s): COLONOSCOPY (N/A)  SURGEON:  Surgeons and Role:    * Urban Garden, MD - Primary  Patient underwent colonoscopy under propofol  sedation.  Tolerated the procedure adequately.   FINDINGS: - Eleven 4 to 15 mm polyps in the transverse colon, in the ascending colon and in the cecum, removed with a cold snare.  Resected and retrieved.  - Three 3 to 6 mm polyps in the sigmoid colon and in the descending colon, removed with a cold snare.  Resected and retrieved.  - Non-bleeding internal hemorrhoids.   RECOMMENDATIONS - Return patient to hospital ward for ongoing care.  - Resume previous diet.  - Await pathology results.  - Repeat colonoscopy is not recommended due to current age (80 years or older) for screening purposes.  - Restart anticoagulation in 2 days.  Samantha Cress, MD Gastroenterology and Hepatology [facility]

## 2024-02-21 NOTE — Transfer of Care (Signed)
 Immediate Anesthesia Transfer of Care Note  Patient: [name]  Procedure(s) Performed: COLONOSCOPY  Patient Location: Short Stay  Anesthesia Type:General  Level of Consciousness: awake  Airway & Oxygen  Therapy: Patient Spontanous Breathing  Post-op Assessment: Report given to RN  Post vital signs: Reviewed  Last Vitals:  Vitals Value Taken Time  BP    Temp    Pulse 89 02/21/24 1558  Resp 15 02/21/24 1558  SpO2 98 % 02/21/24 1558  Vitals shown include unfiled device data.  Last Pain:  Vitals:   02/21/24 1501  TempSrc:   PainSc: 0-No pain         Complications: No notable events documented.

## 2024-02-21 NOTE — Care Management Important Message (Signed)
 Important Message  Patient Details  Name: [name] MRN: [id number] Date of Birth: 07-05-1932   Important Message Given:  Yes - Medicare IM     Ralphael Southgate L Flem Enderle 02/21/2024, 11:40 AM

## 2024-02-21 NOTE — Progress Notes (Signed)
We will proceed with colonoscopy as scheduled.  I thoroughly discussed with the patient the procedure, including the risks involved. Patient understands what the procedure involves including the benefits and any risks. Patient understands alternatives to the proposed procedure. Risks including (but not limited to) bleeding, tearing of the lining (perforation), rupture of adjacent organs, problems with heart and lung function, infection, and medication reactions. A small percentage of complications may require surgery, hospitalization, repeat endoscopic procedure, and/or transfusion.  Patient understood and agreed.  Kristine Peppers, MD Gastroenterology and Hepatology [facility]

## 2024-02-21 NOTE — Plan of Care (Signed)
  Problem: Education: Goal: Knowledge of General Education information will improve Description: Including pain rating scale, medication(s)/side effects and non-pharmacologic comfort measures Outcome: Progressing   Problem: Clinical Measurements: Goal: Ability to maintain clinical measurements within normal limits will improve Outcome: Progressing   Problem: Clinical Measurements: Goal: Diagnostic test results will improve Outcome: Progressing   Problem: Activity: Goal: Risk for activity intolerance will decrease Outcome: Progressing

## 2024-02-21 NOTE — Progress Notes (Addendum)
 PROGRESS NOTE  [name] LKG:[id number] DOB: 27-Nov-1931 DOA: 02/19/2024 PCP: Chandler Combs, MD  Brief History:  89 year old female with a history of hypertension, diabetes mellitus type 2, hyperlipidemia, CKD stage IIIb, paroxysmal atrial fibrillation, stroke, and chronic toe impairment presenting with hematochezia.  The patient is a poor historian secondary to her cognitive impairment.  History is obtained from review of the medical record and speaking with the patient.  The patient states that she went to use the commode and after trying to get up she was feeling some generalized weakness and some cramping in the upper abdomen.  Staff noted the patient to have some blood in the commode, and the patient was feeling clammy.  As result, the patient was transferred to the emergency department for further evaluation and treatment. In the ED, the patient was afebrile and hemodynamically stable with oxygen  saturation 96% on room air.  WBC 10.1, hemoglobin 6.2, platelet 243.  Sodium 139, potassium 5.2, bicarbonate 20, serum creatinine 1.43.  LFTs were unremarkable.  2 units PRBC were ordered.     Assessment/Plan: Hematochezia/acute blood loss anemia - GI consult appreciated - Baseline hemoglobin 9-10 - continues to have hematochezia during the hospitalization - Transfused 3 units PRBC this admission - Clear liquid diet - No previously documented EGD or colonoscopy - planned for colonoscopy 5/30 to allow for apixaban washout -02/21/24 colonoscopy--Eleven 4 to 15 mm polyps in transverse colon, in the ascending colon and cecum,  - Three 3 to 6 mm polyps in sigmoid colon and descending colon; Non-bleeding internal hemorrhoids.  -restart apixaban when ok with GI   Essential hypertension - Continue metoprolol  tartrate - Holding hydralazine , clonidine , and amlodipine  secondary to soft blood pressure initially - restart clonidine  post procedure if BP remains elevated   Paroxysmal  atrial fibrillation - Currently in sinus rhythm - Previously on apixaban--on hold - Patient had admission 10/14/2023 1o 10/17/23 for right flank hematoma after a fall   Cognitive impairment - Continue Aricept  - Continue Seroquel  - Continue sertraline    Depression/anxiety - Continue buspirone  and sertraline   Obesity -BMI 33.90 -lifestyle modification     Family Communication:  no Family at bedside   Consultants:  GI   Code Status:   DNR   DVT Prophylaxis:  SCDs     Procedures: As Listed in Progress Note Above   Antibiotics: None           Subjective: Patient denies fevers, chills, headache, chest pain, dyspnea, nausea, vomiting, diarrhea, abdominal pain, dysuria, hematuria, hematochezia, and melena.   Objective: Vitals:   02/20/24 2009 02/21/24 0330 02/21/24 1326 02/21/24 1334  BP: (!) 176/86 (!) 169/89 (!) 191/85 (!) 168/93  Pulse:  (!) 107 (!) 101   Resp:  (!) 22 18   Temp:  98.5 F (36.9 C) 98.6 F (37 C)   TempSrc:  Oral Oral   SpO2:  98% 98%   Weight:   88.2 kg   Height:   5' 3.5" (1.613 m)     Intake/Output Summary (Last 24 hours) at 02/21/2024 1429 Last data filed at 02/21/2024 0431 Gross per 24 hour  Intake 171.06 ml  Output 1200 ml  Net -1028.94 ml   Weight change:  Exam:  General:  Pt is alert, follows commands appropriately, not in acute distress HEENT: No icterus, No thrush, No neck mass, [place]/AT Cardiovascular: RRR, S1/S2, no rubs, no gallops Respiratory: CTA bilaterally, no wheezing, no crackles, no rhonchi Abdomen: Soft/+BS,  non tender, non distended, no guarding Extremities: No edema, No lymphangitis, No petechiae, No rashes, no synovitis   Data Reviewed: I have personally reviewed following labs and imaging studies Basic Metabolic Panel: Recent Labs  Lab 02/19/24 0420 02/20/24 0441  NA 139 140  K 5.2* 4.5  CL 110 112*  CO2 20* 23  GLUCOSE 271* 147*  BUN 63* 44*  CREATININE 1.43* 1.18*  CALCIUM  8.0* 8.1*   Liver  Function Tests: Recent Labs  Lab 02/19/24 0420  AST 13*  ALT 11  ALKPHOS 30*  BILITOT 0.4  PROT 5.1*  ALBUMIN 2.7*   Recent Labs  Lab 02/19/24 0420  LIPASE 23   No results for input(s): "AMMONIA" in the last 168 hours. Coagulation Profile: Recent Labs  Lab 02/19/24 0420  INR 1.2   CBC: Recent Labs  Lab 02/19/24 0420 02/20/24 0441 02/20/24 1542 02/21/24 0458  WBC 10.1 6.4 8.0 6.6  NEUTROABS  --   --  5.0 4.4  HGB 6.2* 7.0* 9.5* 8.2*  HCT 19.5* 22.0* 28.9* 25.5*  MCV 93.8 93.2 92.6 95.5  PLT 243 145* 201 174   Cardiac Enzymes: No results for input(s): "CKTOTAL", "CKMB", "CKMBINDEX", "TROPONINI" in the last 168 hours. BNP: Invalid input(s): "POCBNP" CBG: No results for input(s): "GLUCAP" in the last 168 hours. HbA1C: No results for input(s): "HGBA1C" in the last 72 hours. Urine analysis:    Component Value Date/Time   COLORURINE YELLOW 10/14/2023 1117   APPEARANCEUR CLEAR 10/14/2023 1117   LABSPEC 1.016 10/14/2023 1117   PHURINE 5.0 10/14/2023 1117   GLUCOSEU NEGATIVE 10/14/2023 1117   HGBUR NEGATIVE 10/14/2023 1117   BILIRUBINUR NEGATIVE 10/14/2023 1117   KETONESUR NEGATIVE 10/14/2023 1117   PROTEINUR NEGATIVE 10/14/2023 1117   UROBILINOGEN 0.2 09/10/2011 2155   NITRITE NEGATIVE 10/14/2023 1117   LEUKOCYTESUR NEGATIVE 10/14/2023 1117   Sepsis Labs: @LABRCNTIP (procalcitonin:4,lacticidven:4) )No results found for this or any previous visit (from the past 240 hours).   Scheduled Meds:  [MAR Hold] busPIRone   5 mg Oral BID   [MAR Hold] donepezil   5 mg Oral QHS   [MAR Hold] ezetimibe   10 mg Oral Daily   [MAR Hold] gabapentin   300 mg Oral TID   [MAR Hold] metoprolol  tartrate  25 mg Oral BID   [MAR Hold] pantoprazole   40 mg Oral BID   [MAR Hold] QUEtiapine   100 mg Oral BID   [MAR Hold] sertraline  75 mg Oral Daily   Continuous Infusions:  lactated ringers  40 mL/hr at 02/21/24 4540    Procedures/Studies: DG Chest Port 1 View Result Date:  02/19/2024 CLINICAL DATA:  89 year old female with epigastric pain. EXAM: PORTABLE CHEST 1 VIEW COMPARISON:  Portable chest 02/28/2023 and earlier. FINDINGS: Portable AP upright view at 0437 hours. Improved lung volumes from last year. Heart size stable and within normal limits. Calcified aortic atherosclerosis. Other mediastinal contours are within normal limits. Visualized tracheal air column is within normal limits. Allowing for portable technique the lungs are clear. No pneumothorax or pleural effusion. Chronic right shoulder arthroplasty. Severe chronic left shoulder degeneration. No acute osseous abnormality identified. Paucity of bowel gas in the visible abdomen. No evidence of pneumoperitoneum. IMPRESSION: No acute cardiopulmonary abnormality. Electronically Signed   By: Marlise Simpers M.D.   On: 02/19/2024 04:59    Demaris Fillers, DO  [facility]  If 7PM-7AM, please contact night-coverage [email or web address] Password [email or web address] 02/21/2024, 2:29 PM   LOS: 2 days

## 2024-02-21 NOTE — Progress Notes (Signed)
We will proceed with colonoscopy as scheduled.  I thoroughly discussed with the patient the procedure, including the risks involved. Patient understands what the procedure involves including the benefits and any risks. Patient understands alternatives to the proposed procedure. Risks including (but not limited to) bleeding, tearing of the lining (perforation), rupture of adjacent organs, problems with heart and lung function, infection, and medication reactions. A small percentage of complications may require surgery, hospitalization, repeat endoscopic procedure, and/or transfusion.  Patient understood and agreed.  Maylon Peppers, MD Gastroenterology and Hepatology [facility]

## 2024-02-22 ENCOUNTER — Telehealth (INDEPENDENT_AMBULATORY_CARE_PROVIDER_SITE_OTHER): Payer: Self-pay | Admitting: Gastroenterology

## 2024-02-22 DIAGNOSIS — D649 Anemia, unspecified: Secondary | ICD-10-CM | POA: Diagnosis not present

## 2024-02-22 DIAGNOSIS — K922 Gastrointestinal hemorrhage, unspecified: Secondary | ICD-10-CM | POA: Diagnosis not present

## 2024-02-22 DIAGNOSIS — K921 Melena: Secondary | ICD-10-CM | POA: Diagnosis not present

## 2024-02-22 DIAGNOSIS — I480 Paroxysmal atrial fibrillation: Secondary | ICD-10-CM | POA: Diagnosis not present

## 2024-02-22 LAB — BASIC METABOLIC PANEL WITH GFR
Anion gap: 6 (ref 5–15)
BUN: 18 mg/dL (ref 8–23)
CO2: 25 mmol/L (ref 22–32)
Calcium: 8.2 mg/dL — ABNORMAL LOW (ref 8.9–10.3)
Chloride: 112 mmol/L — ABNORMAL HIGH (ref 98–111)
Creatinine, Ser: 1.26 mg/dL — ABNORMAL HIGH (ref 0.44–1.00)
GFR, Estimated: 41 mL/min — ABNORMAL LOW (ref >60)
Glucose, Bld: 122 mg/dL — ABNORMAL HIGH (ref 70–99)
Potassium: 4 mmol/L (ref 3.5–5.1)
Sodium: 143 mmol/L (ref 135–145)

## 2024-02-22 LAB — CBC
HCT: 24.4 % — ABNORMAL LOW (ref 36.0–46.0)
Hemoglobin: 7.9 g/dL — ABNORMAL LOW (ref 12.0–15.0)
MCH: 30.4 pg (ref 26.0–34.0)
MCHC: 32.4 g/dL (ref 30.0–36.0)
MCV: 93.8 fL (ref 80.0–100.0)
Platelets: 175 10*3/uL (ref 150–400)
RBC: 2.6 MIL/uL — ABNORMAL LOW (ref 3.87–5.11)
RDW: 14.8 % (ref 11.5–15.5)
WBC: 8.8 10*3/uL (ref 4.0–10.5)
nRBC: 0 % (ref 0.0–0.2)

## 2024-02-22 MED ORDER — ALPRAZOLAM 0.5 MG PO TABS: 0.5000 mg | ORAL_TABLET | Freq: Two times a day (BID) | ORAL | 0 refills | Status: AC | PRN

## 2024-02-22 MED ORDER — APIXABAN 5 MG PO TABS: 5.0000 mg | ORAL_TABLET | Freq: Two times a day (BID) | ORAL | Status: AC

## 2024-02-22 NOTE — Progress Notes (Signed)
 Samantha Cress, M.D. Gastroenterology & Hepatology   Interval History:  Patient reports well.  Denies any nausea, vomiting, melena, hematochezia or abdominal pain. Tolerated diet in the morning adequately. Hemoglobin remains relatively stable at a value of 7.9 today.  Inpatient Medications:  Current Facility-Administered Medications:    acetaminophen  (TYLENOL ) tablet 650 mg, 650 mg, Oral, Q6H PRN, 650 mg at 02/22/24 0038 **OR** acetaminophen  (TYLENOL ) suppository 650 mg, 650 mg, Rectal, Q6H PRN, Myrl Askew, MD   ALPRAZolam  (XANAX ) tablet 0.5 mg, 0.5 mg, Oral, BID PRN, Dorrell, Robert, MD, 0.5 mg at 02/21/24 2156   busPIRone  (BUSPAR ) tablet 5 mg, 5 mg, Oral, BID, Dorrell, Robert, MD, 5 mg at 02/21/24 2157   donepezil  (ARICEPT ) tablet 5 mg, 5 mg, Oral, QHS, Dorrell, Robert, MD, 5 mg at 02/21/24 2157   ezetimibe  (ZETIA ) tablet 10 mg, 10 mg, Oral, Daily, Dorrell, Robert, MD, 10 mg at 02/21/24 1039   gabapentin  (NEURONTIN ) capsule 300 mg, 300 mg, Oral, TID, Myrl Askew, MD, 300 mg at 02/21/24 2156   metoprolol  tartrate (LOPRESSOR ) tablet 25 mg, 25 mg, Oral, BID, Dorrell, Robert, MD, 25 mg at 02/21/24 2157   ondansetron  (ZOFRAN ) tablet 4 mg, 4 mg, Oral, Q6H PRN **OR** ondansetron  (ZOFRAN ) injection 4 mg, 4 mg, Intravenous, Q6H PRN, Myrl Askew, MD   pantoprazole  (PROTONIX ) EC tablet 40 mg, 40 mg, Oral, BID, Tat, David, MD, 40 mg at 02/21/24 2157   QUEtiapine  (SEROQUEL ) tablet 100 mg, 100 mg, Oral, BID, Dorrell, Robert, MD, 100 mg at 02/21/24 2157   sertraline  (ZOLOFT ) tablet 75 mg, 75 mg, Oral, Daily, Dorrell, Robert, MD, 75 mg at 02/21/24 1036   I/O    Intake/Output Summary (Last 24 hours) at 02/22/2024 0734 Last data filed at 02/22/2024 0300 Gross per 24 hour  Intake 540 ml  Output 0 ml  Net 540 ml     Physical Exam: Temp:  [97.5 F (36.4 C)-100.4 F (38 C)] 98 F (36.7 C) (05/31 0337) Pulse Rate:  [82-114] 100 (05/31 0337) Resp:  [13-23] 18 (05/31 0337) BP:  (141-191)/(64-113) 150/64 (05/31 0337) SpO2:  [93 %-98 %] 96 % (05/31 0337) Weight:  [88.2 kg] 88.2 kg (05/30 1326)  Temp (24hrs), Avg:98.9 F (37.2 C), Min:97.5 F (36.4 C), Max:100.4 F (38 C)  GENERAL: The patient is AO x3, in no acute distress. HEENT: Head is normocephalic and atraumatic. EOMI are intact. Mouth is well hydrated and without lesions. NECK: Supple. No masses LUNGS: Clear to auscultation. No presence of rhonchi/wheezing/rales. Adequate chest expansion HEART: RRR, normal s1 and s2. ABDOMEN: Soft, nontender, no guarding, no peritoneal signs, and nondistended. BS +. No masses. EXTREMITIES: Without any cyanosis, clubbing, rash, lesions or edema. NEUROLOGIC: AOx3, no focal motor deficit. SKIN: no jaundice, no rashes  Laboratory Data: CBC:     Component Value Date/Time   WBC 8.8 02/22/2024 0231   RBC 2.60 (L) 02/22/2024 0231   HGB 7.9 (L) 02/22/2024 0231   HCT 24.4 (L) 02/22/2024 0231   PLT 175 02/22/2024 0231   MCV 93.8 02/22/2024 0231   MCH 30.4 02/22/2024 0231   MCHC 32.4 02/22/2024 0231   RDW 14.8 02/22/2024 0231   LYMPHSABS 1.3 02/21/2024 0458   MONOABS 0.6 02/21/2024 0458   EOSABS 0.1 02/21/2024 0458   BASOSABS 0.0 02/21/2024 0458   COAG:  Lab Results  Component Value Date   INR 1.2 02/19/2024   INR 0.9 01/17/2021   INR 0.9 07/08/2019    BMP:     Latest Ref Rng & Units 02/22/2024  2:31 AM 02/20/2024    4:41 AM 02/19/2024    4:20 AM  BMP  Glucose 70 - 99 mg/dL 161  096  045   BUN 8 - 23 mg/dL 18  44  63   Creatinine 0.44 - 1.00 mg/dL 4.09  8.11  9.14   Sodium 135 - 145 mmol/L 143  140  139   Potassium 3.5 - 5.1 mmol/L 4.0  4.5  5.2   Chloride 98 - 111 mmol/L 112  112  110   CO2 22 - 32 mmol/L 25  23  20    Calcium  8.9 - 10.3 mg/dL 8.2  8.1  8.0     HEPATIC:     Latest Ref Rng & Units 02/19/2024    4:20 AM 03/03/2023    4:44 AM 03/01/2023    4:14 AM  Hepatic Function  Total Protein 6.5 - 8.1 g/dL 5.1  6.5  5.7   Albumin 3.5 - 5.0 g/dL 2.7   3.0  2.6   AST 15 - 41 U/L 13  72  197   ALT 0 - 44 U/L 11  247  322   Alk Phosphatase 38 - 126 U/L 30  57  54   Total Bilirubin 0.0 - 1.2 mg/dL 0.4  0.8  0.6     CARDIAC:  Lab Results  Component Value Date   CKTOTAL 978 (H) 02/28/2023   TROPONINI <0.03 11/29/2017      Imaging: I personally reviewed and interpreted the available labs, imaging and endoscopic files.   Assessment/Plan: 89 y.o. year old female with history of CKD, DM, HTN, A fib, who was brought to the hospital after presenting rectal bleeding.  Patient presented GI bleeding in the setting of Eliquis use.  Had drop of her hemoglobin down to 8.2 and underwent colonoscopy yesterday that showed presence of multiple polyps in the right side of her colon and 3 polyps in the sigmoid/descending colon which were removed with a cold snare.  She also had nonbleeding internal hemorrhoids.  No presence of active gastrointestinal bleeding was seen.  Patient has been stable after undergoing colonoscopy without any further bleeding episodes.  Etiology of bleeding could be related to hemorrhoidal bleeding, although polyps could also account for chronic losses.  Will wait for pathology results, but given age, will not recommend repeat colonoscopy for screening purposes.  She can restart her anticoagulation tomorrow.  - 2 large bore IV lines - Active T/S -Follow pathology reports -Continue GI soft diet - Avoid NSAIDs - Okay to restart anticoagulation tomorrow - Patient will follow up in GI clinic in 2-3 weeks. - GI service will sign-off, please call us  back if you have any more questions.  Samantha Cress, MD Gastroenterology and Hepatology [facility] Gastroenterology

## 2024-02-22 NOTE — Discharge Summary (Signed)
 Physician Discharge Summary   Patient: [name] MRN: [id number] DOB: May 28, 1932  Admit date:     02/19/2024  Discharge date: 02/22/24  Discharge Physician: Myrtie Atkinson Cora Stetson   PCP: Chandler Combs, MD   Recommendations at discharge:   Please follow up with primary care provider within 1-2 weeks  Please repeat BMP and CBC in one week     Hospital Course: 89 year old female with a history of hypertension, diabetes mellitus type 2, hyperlipidemia, CKD stage IIIb, paroxysmal atrial fibrillation, stroke, and chronic toe impairment presenting with hematochezia.  The patient is a poor historian secondary to her cognitive impairment.  History is obtained from review of the medical record and speaking with the patient.  The patient states that she went to use the commode and after trying to get up she was feeling some generalized weakness and some cramping in the upper abdomen.  Staff noted the patient to have some blood in the commode, and the patient was feeling clammy.  As result, the patient was transferred to the emergency department for further evaluation and treatment. In the ED, the patient was afebrile and hemodynamically stable with oxygen  saturation 96% on room air.  WBC 10.1, hemoglobin 6.2, platelet 243.  Sodium 139, potassium 5.2, bicarbonate 20, serum creatinine 1.43.  LFTs were unremarkable.  2 units PRBC were ordered.    Assessment and Plan: Hematochezia/acute blood loss anemia - GI consult appreciated - Baseline hemoglobin 9-10 - Transfused 3 units PRBC this admission - Clear liquid diet initially - No previously documented EGD or colonoscopy - planned for colonoscopy 5/30 to allow for apixaban washout -02/21/24 colonoscopy--Eleven 4 to 15 mm polyps in transverse colon, in the ascending colon and cecum,  - Three 3 to 6 mm polyps in sigmoid colon and descending colon; Non-bleeding internal hemorrhoids.  - overall hematochezia subsided during hospitalization -restart apixaban  02/23/24 - diet advanced post-procedure which pt tolerated - Hgb remained stable post PRBC transfusion--Hgb 7.9 on day of dc -02/22/24 cleared by GI for d/c   Essential hypertension - Continue metoprolol  tartrate - Holding hydralazine , clonidine , and amlodipine  secondary to soft blood pressure initially - restart clonidine     Paroxysmal atrial fibrillation - Currently in sinus rhythm - Previously on apixaban--on hold>>restart 6/1 - Patient had admission 10/14/2023 1o 10/17/23 for right flank hematoma after a fall   Cognitive impairment - Continue Aricept  - Continue Seroquel  - Continue sertraline    Depression/anxiety - Continue buspirone  and sertraline   CKD 3b -baseline creatinine 1.0-1.3 -serum creatinine 1.26 on day of d/c   Obesity -BMI 33.90 -lifestyle modification        Consultants: GI Procedures performed: colonoscopy 5/30  Disposition: Skilled nursing facility Diet recommendation:  Regular diet DISCHARGE MEDICATION: Allergies as of 02/22/2024       Reactions   Lisinopril Swelling   angioedema   Hydrochlorothiazide Other (See Comments)   UNSPECIFIED REACTION    Lipitor [atorvastatin Calcium ] Other (See Comments)   UNSPECIFIED REACTION    Thiazide-type Diuretics Other (See Comments)   UNSPECIFIED REACTION    Zocor [simvastatin] Swelling   SWELLING REACTION UNSPECIFIED    Codeine Nausea Only        Medication List     STOP taking these medications    amLODipine  10 MG tablet Commonly known as: NORVASC    hydrALAZINE  100 MG tablet Commonly known as: APRESOLINE    oxyCODONE  5 MG immediate release tablet Commonly known as: Roxicodone        TAKE these medications    acetaminophen  325  MG tablet Commonly known as: TYLENOL  Take 650 mg by mouth every 6 (six) hours as needed.   ALPRAZolam  0.5 MG tablet Commonly known as: XANAX  Take 1 tablet (0.5 mg total) by mouth 2 (two) times daily as needed for anxiety.   apixaban 5 MG Tabs tablet Commonly  known as: ELIQUIS Take 1 tablet (5 mg total) by mouth 2 (two) times daily. Start taking on: February 23, 2024   busPIRone  5 MG tablet Commonly known as: BUSPAR  Take 1 tablet (5 mg total) by mouth 2 (two) times daily.   Cholecalciferol 50 MCG (2000 UT) Caps Take 2,000 Units by mouth daily at 6 (six) AM.   cloNIDine  0.2 MG tablet Commonly known as: CATAPRES  Take 1 tablet (0.2 mg total) by mouth 3 (three) times daily.   donepezil  5 MG tablet Commonly known as: ARICEPT  Take 5 mg by mouth at bedtime.   ezetimibe  10 MG tablet Commonly known as: ZETIA  Take 10 mg by mouth daily.   ferrous sulfate 325 (65 FE) MG tablet Take 325 mg by mouth daily with breakfast.   gabapentin  300 MG capsule Commonly known as: NEURONTIN  Take 1 capsule (300 mg total) by mouth 3 (three) times daily.   metoprolol  tartrate 25 MG tablet Commonly known as: LOPRESSOR  Take 1 tablet (25 mg total) by mouth 2 (two) times daily.   Multivitamin Womens 50+ Adv Tabs Take 1 tablet by mouth daily.   QUEtiapine  100 MG tablet Commonly known as: SEROQUEL  Take 100 mg by mouth 2 (two) times daily.   saccharomyces boulardii 250 MG capsule Commonly known as: FLORASTOR Take 250 mg by mouth daily at 6 (six) AM.   sertraline  25 MG tablet Commonly known as: ZOLOFT  Take 75 mg by mouth daily.   SUPER B COMPLEX PO Take 1 tablet by mouth daily.        Discharge Exam: Filed Weights   02/19/24 0413 02/21/24 1326  Weight: 88.2 kg 88.2 kg   HEENT:  [place]/AT, No thrush, no icterus CV:  RRR, no rub, no S3, no S4 Lung:  CTA, no wheeze, no rhonchi Abd:  soft/+BS, NT Ext:  No edema, no lymphangitis, no synovitis, no rash   Condition at discharge: stable  The results of significant diagnostics from this hospitalization (including imaging, microbiology, ancillary and laboratory) are listed below for reference.   Imaging Studies: DG Chest Port 1 View Result Date: 02/19/2024 CLINICAL DATA:  89 year old female with  epigastric pain. EXAM: PORTABLE CHEST 1 VIEW COMPARISON:  Portable chest 02/28/2023 and earlier. FINDINGS: Portable AP upright view at 0437 hours. Improved lung volumes from last year. Heart size stable and within normal limits. Calcified aortic atherosclerosis. Other mediastinal contours are within normal limits. Visualized tracheal air column is within normal limits. Allowing for portable technique the lungs are clear. No pneumothorax or pleural effusion. Chronic right shoulder arthroplasty. Severe chronic left shoulder degeneration. No acute osseous abnormality identified. Paucity of bowel gas in the visible abdomen. No evidence of pneumoperitoneum. IMPRESSION: No acute cardiopulmonary abnormality. Electronically Signed   By: Marlise Simpers M.D.   On: 02/19/2024 04:59    Microbiology: Results for orders placed or performed during the hospital encounter of 02/28/23  SARS Coronavirus 2 by RT PCR (hospital order, performed in [facility] hospital lab) *cepheid single result test* Anterior Nasal Swab     Status: None   Collection Time: 02/28/23 10:40 AM   Specimen: Anterior Nasal Swab  Result Value Ref Range Status   SARS Coronavirus 2 by RT PCR  NEGATIVE NEGATIVE Final    Comment: (NOTE) SARS-CoV-2 target nucleic acids are NOT DETECTED.  The SARS-CoV-2 RNA is generally detectable in upper and lower respiratory specimens during the acute phase of infection. The lowest concentration of SARS-CoV-2 viral copies this assay can detect is 250 copies / mL. A negative result does not preclude SARS-CoV-2 infection and should not be used as the sole basis for treatment or other patient management decisions.  A negative result may occur with improper specimen collection / handling, submission of specimen other than nasopharyngeal swab, presence of viral mutation(s) within the areas targeted by this assay, and inadequate number of viral copies (<250 copies / mL). A negative result must be combined with  clinical observations, patient history, and epidemiological information.  Fact Sheet for Patients:   RoadLapTop.co.za  Fact Sheet for Healthcare Providers: http://kim-miller.com/  This test is not yet approved or  cleared by the [place]  FDA and has been authorized for detection and/or diagnosis of SARS-CoV-2 by FDA under an Emergency Use Authorization (EUA).  This EUA will remain in effect (meaning this test can be used) for the duration of the COVID-19 declaration under Section 564(b)(1) of the Act, 21 U.S.C. section 360bbb-3(b)(1), unless the authorization is terminated or revoked sooner.  Performed at [facility], [address] [place], [place] [address]   MRSA Next Gen by PCR, Nasal     Status: Abnormal   Collection Time: 02/28/23  4:08 PM   Specimen: Nasal Mucosa; Nasal Swab  Result Value Ref Range Status   MRSA by PCR Next Gen DETECTED (A) NOT DETECTED Final    Comment: CRITICAL RESULT CALLED TO, READ BACK BY AND VERIFIED WITH: CATES LAND, MICHELLE @ 1749 ON 02/28/2023 BY FRATTO,ASHLEY        The GeneXpert MRSA Assay (FDA approved for NASAL specimens only), is one component of a comprehensive MRSA colonization surveillance program. It is not intended to diagnose MRSA infection nor to guide or monitor treatment for MRSA infections. Performed at [facility], [address], [place], [place] [address]     Labs: CBC: Recent Labs  Lab 02/19/24 [id number] 02/20/24 0441 02/20/24 1542 02/21/24 0458 02/22/24 0231  WBC 10.1 6.4 8.0 6.6 8.8  NEUTROABS  --   --  5.0 4.4  --   HGB 6.2* 7.0* 9.5* 8.2* 7.9*  HCT 19.5* 22.0* 28.9* 25.5* 24.4*  MCV 93.8 93.2 92.6 95.5 93.8  PLT 243 145* 201 174 175   Basic Metabolic Panel: Recent Labs  Lab 02/19/24 0420 02/20/24 0441 02/22/24 0231  NA 139 140 143  K 5.2* 4.5 4.0  CL 110 112* 112*  CO2 20* 23 25  GLUCOSE 271* 147* 122*  BUN 63* 44* 18  CREATININE 1.43* 1.18* 1.26*   CALCIUM  8.0* 8.1* 8.2*   Liver Function Tests: Recent Labs  Lab 02/19/24 0420  AST 13*  ALT 11  ALKPHOS 30*  BILITOT 0.4  PROT 5.1*  ALBUMIN 2.7*   CBG: Recent Labs  Lab 02/21/24 1559  GLUCAP 113*    Discharge time spent: greater than 30 minutes.  Signed: Demaris Fillers, MD [facility] 02/22/2024

## 2024-02-22 NOTE — [place] FL2 (Signed)
 [place]  MEDICAID [id number] LEVEL OF CARE FORM     IDENTIFICATION  Patient Name: [name] Birthdate: [birth year] Sex: female Admission Date (Current Location): 02/19/2024  [facility] and [place] Number:      Facility and Address:         Provider Number:    Attending Physician Name and Address:  No att. providers found  Relative Name and Phone Number:       Current Level of Care:   Recommended Level of Care:   Prior Approval Number:    Date Approved/Denied:   PASRR Number:    Discharge Plan:      Current Diagnoses: Patient Active Problem List   Diagnosis Date Noted   Symptomatic anemia 02/20/2024   Lower GI bleed 02/19/2024   Hematochezia 02/19/2024   ABLA (acute blood loss anemia) 02/19/2024   PAF (paroxysmal atrial fibrillation) (HCC) 02/19/2024   AKI (acute kidney injury) (HCC) 10/14/2023   Generalized weakness 03/05/2023   Pressure injury of skin 03/05/2023   Acute on chronic renal failure (HCC) 02/28/2023   OA (osteoarthritis) 02/28/2023   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   CVA (cerebral vascular accident) (HCC) 02/28/2023   Mixed hyperlipidemia 02/28/2023   Periprosthetic fracture of patella 09/21/2022   Peri-prosthetic patellar fracture 09/19/2022   S/P total knee arthroplasty, left 09/03/2022   Degenerative arthritis of left knee 01/18/2021   S/P reverse total shoulder arthroplasty, 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   Primary osteoarthritis of right hip 01/20/2018   Osteoarthritis of right knee 01/18/2018   Chronic pain syndrome    Anxiety    Right hip pain 11/29/2017   CKD (chronic kidney disease) stage 2, GFR 60-89 ml/min 11/29/2017   Prosthetic joint infection (HCC)    Renal failure 05/27/2012   HTN (hypertension) 10/17/2011   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    Orientation RESPIRATION BLADDER Height & Weight            Weight: 88.2 kg Height:  5' 3.5" (161.3 cm)  BEHAVIORAL SYMPTOMS/MOOD NEUROLOGICAL BOWEL NUTRITION STATUS            AMBULATORY STATUS COMMUNICATION OF NEEDS Skin                               Personal Care Assistance Level of Assistance              Functional Limitations Info             SPECIAL CARE FACTORS FREQUENCY                       Contractures      Additional Factors Info                  Current Medications (02/22/2024):  This is the current hospital active medication list Current Facility-Administered Medications  Medication Dose Route Frequency Provider Last Rate Last Admin   acetaminophen  (TYLENOL ) tablet 650 mg  650 mg Oral Q6H PRN Myrl Askew, MD   650 mg at 02/22/24 0038   Or   acetaminophen  (TYLENOL ) suppository 650 mg  650 mg Rectal Q6H PRN Myrl Askew, MD       ALPRAZolam  (XANAX ) tablet 0.5 mg  0.5 mg Oral BID PRN Myrl Askew, MD   0.5 mg at 02/21/24 2156   busPIRone  (BUSPAR ) tablet 5 mg  5 mg Oral BID Myrl Askew, MD   5 mg at 02/22/24 4098   donepezil  (  ARICEPT ) tablet 5 mg  5 mg Oral QHS Dorrell, Robert, MD   5 mg at 02/21/24 2157   ezetimibe  (ZETIA ) tablet 10 mg  10 mg Oral Daily Dorrell, Porfirio Bristol, MD   10 mg at 02/22/24 0347   gabapentin  (NEURONTIN ) capsule 300 mg  300 mg Oral TID Myrl Askew, MD   300 mg at 02/22/24 0837   metoprolol  tartrate (LOPRESSOR ) tablet 25 mg  25 mg Oral BID Myrl Askew, MD   25 mg at 02/22/24 4259   ondansetron  (ZOFRAN ) tablet 4 mg  4 mg Oral Q6H PRN Myrl Askew, MD       Or   ondansetron  (ZOFRAN ) injection 4 mg  4 mg Intravenous Q6H PRN Dorrell, Robert, MD       pantoprazole  (PROTONIX ) EC tablet 40 mg  40 mg Oral BID Tat, David, MD   40 mg at 02/22/24 0837   QUEtiapine  (SEROQUEL ) tablet 100 mg  100 mg Oral BID Myrl Askew, MD   100 mg at 02/22/24 5638   sertraline  (ZOLOFT ) tablet 75 mg  75 mg Oral Daily Myrl Askew, MD   75 mg at 02/22/24 7564   Current Outpatient Medications  Medication Sig Dispense Refill   acetaminophen  (TYLENOL ) 325 MG tablet Take 650 mg by mouth every 6 (six)  hours as needed.     ALPRAZolam  (XANAX ) 0.5 MG tablet Take 1 tablet (0.5 mg total) by mouth 2 (two) times daily as needed for anxiety. 8 tablet 0   [START ON 02/23/2024] apixaban (ELIQUIS) 5 MG TABS tablet Take 1 tablet (5 mg total) by mouth 2 (two) times daily.     B Complex-C (SUPER B COMPLEX PO) Take 1 tablet by mouth daily.     busPIRone  (BUSPAR ) 5 MG tablet Take 1 tablet (5 mg total) by mouth 2 (two) times daily. 10 tablet 0   Cholecalciferol 50 MCG (2000 UT) CAPS Take 2,000 Units by mouth daily at 6 (six) AM.     cloNIDine  (CATAPRES ) 0.2 MG tablet Take 1 tablet (0.2 mg total) by mouth 3 (three) times daily.     donepezil  (ARICEPT ) 5 MG tablet Take 5 mg by mouth at bedtime.     ezetimibe  (ZETIA ) 10 MG tablet Take 10 mg by mouth daily.     ferrous sulfate 325 (65 FE) MG tablet Take 325 mg by mouth daily with breakfast.     gabapentin  (NEURONTIN ) 300 MG capsule Take 1 capsule (300 mg total) by mouth 3 (three) times daily. (Patient taking differently: Take 300 mg by mouth 3 (three) times daily.) 90 capsule 1   metoprolol  tartrate (LOPRESSOR ) 25 MG tablet Take 1 tablet (25 mg total) by mouth 2 (two) times daily. 60 tablet 2   Multiple Vitamins-Minerals (MULTIVITAMIN WOMENS 50+ ADV) TABS Take 1 tablet by mouth daily.     QUEtiapine  (SEROQUEL ) 100 MG tablet Take 100 mg by mouth 2 (two) times daily.     saccharomyces boulardii (FLORASTOR) 250 MG capsule Take 250 mg by mouth daily at 6 (six) AM.     sertraline  (ZOLOFT ) 25 MG tablet Take 75 mg by mouth daily.       Discharge Medications: Allergies as of 02/22/2024       Reactions   Lisinopril Swelling   angioedema   Hydrochlorothiazide Other (See Comments)   UNSPECIFIED REACTION    Lipitor [atorvastatin Calcium ] Other (See Comments)   UNSPECIFIED REACTION    Thiazide-type Diuretics Other (See Comments)   UNSPECIFIED REACTION    Zocor [simvastatin] Swelling  SWELLING REACTION UNSPECIFIED    Codeine Nausea Only        Medication List      STOP taking these medications    amLODipine  10 MG tablet Commonly known as: NORVASC    hydrALAZINE  100 MG tablet Commonly known as: APRESOLINE    oxyCODONE  5 MG immediate release tablet Commonly known as: Roxicodone        TAKE these medications    acetaminophen  325 MG tablet Commonly known as: TYLENOL  Take 650 mg by mouth every 6 (six) hours as needed.   ALPRAZolam  0.5 MG tablet Commonly known as: XANAX  Take 1 tablet (0.5 mg total) by mouth 2 (two) times daily as needed for anxiety.   apixaban 5 MG Tabs tablet Commonly known as: ELIQUIS Take 1 tablet (5 mg total) by mouth 2 (two) times daily. Start taking on: February 23, 2024   busPIRone  5 MG tablet Commonly known as: BUSPAR  Take 1 tablet (5 mg total) by mouth 2 (two) times daily.   Cholecalciferol 50 MCG (2000 UT) Caps Take 2,000 Units by mouth daily at 6 (six) AM.   cloNIDine  0.2 MG tablet Commonly known as: CATAPRES  Take 1 tablet (0.2 mg total) by mouth 3 (three) times daily.   donepezil  5 MG tablet Commonly known as: ARICEPT  Take 5 mg by mouth at bedtime.   ezetimibe  10 MG tablet Commonly known as: ZETIA  Take 10 mg by mouth daily.   ferrous sulfate 325 (65 FE) MG tablet Take 325 mg by mouth daily with breakfast.   gabapentin  300 MG capsule Commonly known as: NEURONTIN  Take 1 capsule (300 mg total) by mouth 3 (three) times daily.   metoprolol  tartrate 25 MG tablet Commonly known as: LOPRESSOR  Take 1 tablet (25 mg total) by mouth 2 (two) times daily.   Multivitamin Womens 50+ Adv Tabs Take 1 tablet by mouth daily.   QUEtiapine  100 MG tablet Commonly known as: SEROQUEL  Take 100 mg by mouth 2 (two) times daily.   saccharomyces boulardii 250 MG capsule Commonly known as: FLORASTOR Take 250 mg by mouth daily at 6 (six) AM.   sertraline  25 MG tablet Commonly known as: ZOLOFT  Take 75 mg by mouth daily.   SUPER B COMPLEX PO Take 1 tablet by mouth daily.         Relevant Imaging  Results:  Relevant Lab Results:   Additional Information    Lynda Sands, RN

## 2024-02-22 NOTE — Telephone Encounter (Signed)
 Hi Amalia Badder,  Can you please schedule a follow up appointment for this patient in 2-3 weeks with me or Chelsea/Leslie?  Thanks,  Samantha Cress, MD Gastroenterology and Hepatology [facility]

## 2024-02-22 NOTE — Progress Notes (Signed)
   02/22/24 1205  [place] Medicaid FL2 Level of Care Screening Tool  Provider Number [facility]  Facility and Address [facility]  Relative Name and Phone Eliott Guess  Current Level of [facility]  Discharge Plan SNF  Orientation Self;Time;Situation;Place  Personal Care Assistance Bathing;Feeding;Dressing;Total care  Bathing Assistance Limited assistance  Feeding assistance Limited assistance  Dressing Assistance Limited assistance  Total Care Assistance Limited assistance  Ambulatory Status Supervision  Functional Limitations Sight;Hearing;Speech  Sight Info Impaired (glasses)  Hearing Info Adequate  Speech Info Adequate  Bladder Continent  Bowel Continent  Communication of Needs Verbally  Skin Normal  Contractures Info Not present  Additional Information SSN [id number]   Patient discharge to [facility]

## 2024-02-22 NOTE — Progress Notes (Signed)
   02/22/24 1036  Medical Necessity for Transport Certificate --- IF THIS TRANSPORT IS ROUND TRIP OR SCHEDULED AND REPEATED, A PHYSICIAN MUST COMPLETE THIS FORM  Transport from: (Location) [facility]  Transport to (Location) Other (Comment) [facility])  Did the patient arrive from a Skilled Nursing Facility, Assisted Living Facility or Group Home? Yes  Care Facility Name Other (enter name of facility below)  Care Facility Name [facility]  Is this the closest appropriate facility? Yes  Date of Transport Service 02/22/24  Name of Transporting Agency [facility]  Round Trip Transport? No  Reason for Transport Discharge  Is this a hospice patient? No  Q1 Are ALL the following "true"? 1. Patient unable to get up from bed without assistance  AND  2. Unable to ambulate  AND  3. Unable to sit in a chair, including wheelchair. Yes  Q2 Could the patient be transported safely by other means of transportation (I.E., wheelchair van)? No  Electronic Signature Estle Hemp  Credentials RN  Date Signed 02/22/24  Print Form Print

## 2024-02-22 NOTE — Progress Notes (Signed)
 Gave report to Amy RN @ [facility]. Patient has been placed on EMS list for transport.

## 2024-02-22 NOTE — [place] FL2 (Signed)
 [place]  MEDICAID FL2 LEVEL OF CARE FORM     IDENTIFICATION  Patient Name: [name] Birthdate: 16-Aug-1932 Sex: female Admission Date (Current Location): 02/19/2024  [facility] and [place] Number:  [employer] and Address:  [facility],  [address] [place] [address]      Provider Number: [id number]  Attending Physician Name and Address:  No att. providers found  Relative Name and Phone Number:  Eliott Guess    Current Level of Care: Hospital Recommended Level of Care: Nursing Facility Prior Approval Number:    Date Approved/Denied:   PASRR Number:    Discharge Plan: SNF    Current Diagnoses: Patient Active Problem List   Diagnosis Date Noted   Symptomatic anemia 02/20/2024   Lower GI bleed 02/19/2024   Hematochezia 02/19/2024   ABLA (acute blood loss anemia) 02/19/2024   PAF (paroxysmal atrial fibrillation) (HCC) 02/19/2024   AKI (acute kidney injury) (HCC) 10/14/2023   Generalized weakness 03/05/2023   Pressure injury of skin 03/05/2023   Acute on chronic renal failure (HCC) 02/28/2023   OA (osteoarthritis) 02/28/2023   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   CVA (cerebral vascular accident) (HCC) 02/28/2023   Mixed hyperlipidemia 02/28/2023   Periprosthetic fracture of patella 09/21/2022   Peri-prosthetic patellar fracture 09/19/2022   S/P total knee arthroplasty, left 09/03/2022   Degenerative arthritis of left knee 01/18/2021   S/P reverse total shoulder arthroplasty, 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   Primary osteoarthritis of right hip 01/20/2018   Osteoarthritis of right knee 01/18/2018   Chronic pain syndrome    Anxiety    Right hip pain 11/29/2017   CKD (chronic kidney disease) stage 2, GFR 60-89 ml/min 11/29/2017   Prosthetic joint infection (HCC)    Renal failure 05/27/2012   HTN (hypertension) 10/17/2011   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    Orientation RESPIRATION BLADDER Height & Weight     Self, Time,  Situation, Place  Normal Continent Weight: 88.2 kg Height:  5' 3.5" (161.3 cm)  BEHAVIORAL SYMPTOMS/MOOD NEUROLOGICAL BOWEL NUTRITION STATUS      Continent Diet  AMBULATORY STATUS COMMUNICATION OF NEEDS Skin   Supervision Verbally Normal                       Personal Care Assistance Level of Assistance  Bathing, Feeding, Dressing, Total care Bathing Assistance: Limited assistance Feeding assistance: Limited assistance Dressing Assistance: Limited assistance Total Care Assistance: Limited assistance   Functional Limitations Info  Sight, Hearing, Speech Sight Info: Impaired (glasses) Hearing Info: Adequate Speech Info: Adequate    SPECIAL CARE FACTORS FREQUENCY                       Contractures Contractures Info: Not present    Additional Factors Info                  Current Medications (02/22/2024):  This is the current hospital active medication list Current Facility-Administered Medications  Medication Dose Route Frequency Provider Last Rate Last Admin   acetaminophen  (TYLENOL ) tablet 650 mg  650 mg Oral Q6H PRN Myrl Askew, MD   650 mg at 02/22/24 0038   Or   acetaminophen  (TYLENOL ) suppository 650 mg  650 mg Rectal Q6H PRN Myrl Askew, MD       ALPRAZolam  (XANAX ) tablet 0.5 mg  0.5 mg Oral BID PRN Myrl Askew, MD   0.5 mg at 02/21/24 2156   busPIRone  (BUSPAR ) tablet 5 mg  5 mg Oral BID Dorrell, Robert, MD   5 mg at 02/22/24 1610   donepezil  (ARICEPT ) tablet 5 mg  5 mg Oral QHS Dorrell, Robert, MD   5 mg at 02/21/24 2157   ezetimibe  (ZETIA ) tablet 10 mg  10 mg Oral Daily Dorrell, Robert, MD   10 mg at 02/22/24 9604   gabapentin  (NEURONTIN ) capsule 300 mg  300 mg Oral TID Myrl Askew, MD   300 mg at 02/22/24 0837   metoprolol  tartrate (LOPRESSOR ) tablet 25 mg  25 mg Oral BID Myrl Askew, MD   25 mg at 02/22/24 5409   ondansetron  (ZOFRAN ) tablet 4 mg  4 mg Oral Q6H PRN Myrl Askew, MD       Or   ondansetron  (ZOFRAN ) injection 4  mg  4 mg Intravenous Q6H PRN Dorrell, Robert, MD       pantoprazole  (PROTONIX ) EC tablet 40 mg  40 mg Oral BID Tat, David, MD   40 mg at 02/22/24 8119   QUEtiapine  (SEROQUEL ) tablet 100 mg  100 mg Oral BID Myrl Askew, MD   100 mg at 02/22/24 1478   sertraline  (ZOLOFT ) tablet 75 mg  75 mg Oral Daily Myrl Askew, MD   75 mg at 02/22/24 2956   Current Outpatient Medications  Medication Sig Dispense Refill   acetaminophen  (TYLENOL ) 325 MG tablet Take 650 mg by mouth every 6 (six) hours as needed.     ALPRAZolam  (XANAX ) 0.5 MG tablet Take 1 tablet (0.5 mg total) by mouth 2 (two) times daily as needed for anxiety. 8 tablet 0   [START ON 02/23/2024] apixaban (ELIQUIS) 5 MG TABS tablet Take 1 tablet (5 mg total) by mouth 2 (two) times daily.     B Complex-C (SUPER B COMPLEX PO) Take 1 tablet by mouth daily.     busPIRone  (BUSPAR ) 5 MG tablet Take 1 tablet (5 mg total) by mouth 2 (two) times daily. 10 tablet 0   Cholecalciferol 50 MCG (2000 UT) CAPS Take 2,000 Units by mouth daily at 6 (six) AM.     cloNIDine  (CATAPRES ) 0.2 MG tablet Take 1 tablet (0.2 mg total) by mouth 3 (three) times daily.     donepezil  (ARICEPT ) 5 MG tablet Take 5 mg by mouth at bedtime.     ezetimibe  (ZETIA ) 10 MG tablet Take 10 mg by mouth daily.     ferrous sulfate 325 (65 FE) MG tablet Take 325 mg by mouth daily with breakfast.     gabapentin  (NEURONTIN ) 300 MG capsule Take 1 capsule (300 mg total) by mouth 3 (three) times daily. (Patient taking differently: Take 300 mg by mouth 3 (three) times daily.) 90 capsule 1   metoprolol  tartrate (LOPRESSOR ) 25 MG tablet Take 1 tablet (25 mg total) by mouth 2 (two) times daily. 60 tablet 2   Multiple Vitamins-Minerals (MULTIVITAMIN WOMENS 50+ ADV) TABS Take 1 tablet by mouth daily.     QUEtiapine  (SEROQUEL ) 100 MG tablet Take 100 mg by mouth 2 (two) times daily.     saccharomyces boulardii (FLORASTOR) 250 MG capsule Take 250 mg by mouth daily at 6 (six) AM.     sertraline  (ZOLOFT )  25 MG tablet Take 75 mg by mouth daily.       Discharge Medications: Allergies as of 02/22/2024       Reactions   Lisinopril Swelling   angioedema   Hydrochlorothiazide Other (See Comments)   UNSPECIFIED REACTION    Lipitor [atorvastatin Calcium ] Other (See Comments)   UNSPECIFIED REACTION  Thiazide-type Diuretics Other (See Comments)   UNSPECIFIED REACTION    Zocor [simvastatin] Swelling   SWELLING REACTION UNSPECIFIED    Codeine Nausea Only        Medication List     STOP taking these medications    amLODipine  10 MG tablet Commonly known as: NORVASC    hydrALAZINE  100 MG tablet Commonly known as: APRESOLINE    oxyCODONE  5 MG immediate release tablet Commonly known as: Roxicodone        TAKE these medications    acetaminophen  325 MG tablet Commonly known as: TYLENOL  Take 650 mg by mouth every 6 (six) hours as needed.   ALPRAZolam  0.5 MG tablet Commonly known as: XANAX  Take 1 tablet (0.5 mg total) by mouth 2 (two) times daily as needed for anxiety.   apixaban 5 MG Tabs tablet Commonly known as: ELIQUIS Take 1 tablet (5 mg total) by mouth 2 (two) times daily. Start taking on: February 23, 2024   busPIRone  5 MG tablet Commonly known as: BUSPAR  Take 1 tablet (5 mg total) by mouth 2 (two) times daily.   Cholecalciferol 50 MCG (2000 UT) Caps Take 2,000 Units by mouth daily at 6 (six) AM.   cloNIDine  0.2 MG tablet Commonly known as: CATAPRES  Take 1 tablet (0.2 mg total) by mouth 3 (three) times daily.   donepezil  5 MG tablet Commonly known as: ARICEPT  Take 5 mg by mouth at bedtime.   ezetimibe  10 MG tablet Commonly known as: ZETIA  Take 10 mg by mouth daily.   ferrous sulfate 325 (65 FE) MG tablet Take 325 mg by mouth daily with breakfast.   gabapentin  300 MG capsule Commonly known as: NEURONTIN  Take 1 capsule (300 mg total) by mouth 3 (three) times daily.   metoprolol  tartrate 25 MG tablet Commonly known as: LOPRESSOR  Take 1 tablet (25 mg total)  by mouth 2 (two) times daily.   Multivitamin Womens 50+ Adv Tabs Take 1 tablet by mouth daily.   QUEtiapine  100 MG tablet Commonly known as: SEROQUEL  Take 100 mg by mouth 2 (two) times daily.   saccharomyces boulardii 250 MG capsule Commonly known as: FLORASTOR Take 250 mg by mouth daily at 6 (six) AM.   sertraline  25 MG tablet Commonly known as: ZOLOFT  Take 75 mg by mouth daily.   SUPER B COMPLEX PO Take 1 tablet by mouth daily.         Relevant Imaging Results:  Relevant Lab Results:   Additional Information SSN [id number]  Lynda Sands, RN

## 2024-02-23 NOTE — Anesthesia Postprocedure Evaluation (Signed)
 Anesthesia Post Note  Patient: [name]  Procedure(s) Performed: COLONOSCOPY  Patient location during evaluation: Phase II Anesthesia Type: General Level of consciousness: awake Pain management: pain level controlled Vital Signs Assessment: post-procedure vital signs reviewed and stable Respiratory status: spontaneous breathing and respiratory function stable Cardiovascular status: blood pressure returned to baseline and stable Postop Assessment: no headache and no apparent nausea or vomiting Anesthetic complications: no Comments: Late entry   No notable events documented.   Last Vitals:  Vitals:   02/22/24 0028 02/22/24 0337  BP: (!) 157/72 (!) 150/64  Pulse: (!) 108 100  Resp: 20 18  Temp: (!) 38 C 36.7 C  SpO2: 93% 96%    Last Pain:  Vitals:   02/22/24 1005  TempSrc:   PainSc: 0-No pain                 Coretha Dew

## 2024-02-25 ENCOUNTER — Encounter (HOSPITAL_COMMUNITY): Payer: Self-pay | Admitting: Gastroenterology

## 2024-02-25 ENCOUNTER — Ambulatory Visit (INDEPENDENT_AMBULATORY_CARE_PROVIDER_SITE_OTHER): Payer: Self-pay | Admitting: Gastroenterology

## 2024-02-25 LAB — SURGICAL PATHOLOGY

## 2024-03-05 ENCOUNTER — Ambulatory Visit (INDEPENDENT_AMBULATORY_CARE_PROVIDER_SITE_OTHER): Admitting: Gastroenterology

## 2024-03-09 ENCOUNTER — Ambulatory Visit (INDEPENDENT_AMBULATORY_CARE_PROVIDER_SITE_OTHER): Admitting: Gastroenterology

## 2024-03-09 ENCOUNTER — Encounter (INDEPENDENT_AMBULATORY_CARE_PROVIDER_SITE_OTHER): Payer: Self-pay | Admitting: Gastroenterology

## 2024-03-09 VITALS — BP 134/73 | HR 91 | Temp 98.2°F | Ht 63.5 in | Wt 203.2 lb

## 2024-03-09 DIAGNOSIS — K625 Hemorrhage of anus and rectum: Secondary | ICD-10-CM | POA: Diagnosis not present

## 2024-03-09 DIAGNOSIS — K649 Unspecified hemorrhoids: Secondary | ICD-10-CM | POA: Diagnosis not present

## 2024-03-09 DIAGNOSIS — D649 Anemia, unspecified: Secondary | ICD-10-CM

## 2024-03-09 DIAGNOSIS — Z860101 Personal history of adenomatous and serrated colon polyps: Secondary | ICD-10-CM | POA: Diagnosis not present

## 2024-03-09 DIAGNOSIS — D500 Iron deficiency anemia secondary to blood loss (chronic): Secondary | ICD-10-CM | POA: Insufficient documentation

## 2024-03-09 NOTE — Progress Notes (Addendum)
 Referring Provider: Chandler Combs, MD Primary Care Physician:  Chandler Combs, MD Primary GI Physician: Dr. Sammi Crick   Chief Complaint  Patient presents with   Follow-up    Pt arrives for follow up. Pt had colonoscopy in May.     HPI:   [name] is a 89 y.o. female with past medical history of CKD, DM, HTN, A fib on Eliquis    Patient presenting today for:  Hospital Follow up of rectal bleeding and anemia   Patient with admission to [facility] on 02/19/24 for rectal bleeding and intermittent abdominal pain. Hgb 6.2 on admission, s/p 2 units PRBCS   Last hgb 7.9 on 02/22/24  Colonoscopy as outlined below, rectal bleeding suspected possibly related to hemorrhoids vs. Polyp burden  Present: Feeling somewhat fatigued, she has some SOB dizzy at baseline. She notes that she saw some BRB in stools, thinks last episode was last week. She notes that once the stool hit the toilet she could tell there was blood in it. She let the staff at her SNF know. She has some soreness in the top of her abdomen ongoing since her admission. No lower abdominal pain. No nausea or vomiting. She denies constipation or diarrhea. Appetite is good. No weight loss. She is taking an Iron  pill per MAR. She notes she has been on this for some time. Does not think she has had any recent labs drawn. Eliquis  was restarted after her hospital admission per [facility].   Last Colonoscopy:02/21/24 - Eleven 4 to 15 mm polyps in the transverse colon,                            in the ascending colon and in the cecum, removed                            with a cold snare. Resected and retrieved.                           - Three 3 to 6 mm polyps in the sigmoid colon and                            in the descending colon, removed with a cold snare.                            Resected and retrieved.                           - Non-bleeding internal hemorrhoids. A. CECUM, ASCENDING AND TRANSVERSE COLON, POLYPECTOMY:  Tubular  adenoma, multiple fragments  Negative for high-grade dysplasia and carcinoma   B. DESCENDING AND SIGMOID COLON, POLYPECTOMY:  Tubular adenoma, multiple fragments  Negative for high-grade dysplasia and carcinoma   Last Endoscopy:never   Filed Weights   03/09/24 0933  Weight: 203 lb 3.2 oz (92.2 kg)     Past Medical History:  Diagnosis Date   Anxiety    Arthritis    Asthma    Cancer (HCC)    SKIN CA OF FACE   Chronic kidney disease    Chronic pain    COVID    moderate case - had pneumonia   Depression    Diabetes mellitus without complication (HCC)  Gallstones    GERD (gastroesophageal reflux disease)    Hyperlipidemia    Hypertension    Insomnia    Iron  deficiency anemia    Paroxysmal atrial fibrillation (HCC)    Peri-prosthetic patellar fracture 09/19/2022   Pneumonia    Prosthetic joint infection (HCC)    RBBB 03/29/2022   TIA (transient ischemic attack)     Past Surgical History:  Procedure Laterality Date    ARM FRACTURE     LEFT  and right   CHOLECYSTECTOMY     COLONOSCOPY N/A 02/21/2024   Procedure: COLONOSCOPY;  Surgeon: Urban Garden, MD;  [facility];  Service: Gastroenterology;  Laterality: N/A;   DILATION AND CURETTAGE OF UTERUS     IRRIGATION AND DEBRIDEMENT KNEE  09/12/2011   Procedure: IRRIGATION AND DEBRIDEMENT KNEE;  Surgeon: Ilean Mall;  Location: [facility] OR;  Service: Orthopedics;  Laterality: Right;  I&D RIGHT TKA REVISE BEARINGS/MBT. Poly exchange   PATELLAR TENDON REPAIR Left 09/21/2022   Procedure: LEFT PATELLAR TENDON REPAIR;  Surgeon: Wendolyn Hamburger, MD;  Location: [facility] OR;  Service: Orthopedics;  Laterality: Left;   REPLACEMENT TOTAL KNEE     right   TOTAL HIP ARTHROPLASTY Left    TOTAL HIP ARTHROPLASTY Right 01/20/2018   Procedure: TOTAL HIP ARTHROPLASTY WITH REMOVAL OF INTRAMEDULLARY NAIL;  Surgeon: Wendolyn Hamburger, MD;  Location: [facility] OR;  Service: Orthopedics;  Laterality: Right;   TOTAL KNEE ARTHROPLASTY Left  09/03/2022   Procedure: LEFT TOTAL KNEE ARTHROPLASTY;  Surgeon: Wendolyn Hamburger, MD;  Location: [facility] ORS;  Service: Orthopedics;  Laterality: Left;   TOTAL SHOULDER ARTHROPLASTY Right 10/02/2018   Procedure: REVERSE TOTAL SHOULDER;  Surgeon: Sammye Cristal, MD;  Location: [facility] OR;  Service: Orthopedics;  Laterality: Right;    Current Outpatient Medications  Medication Sig Dispense Refill   acetaminophen  (TYLENOL ) 325 MG tablet Take 650 mg by mouth every 6 (six) hours as needed.     ALPRAZolam  (XANAX ) 0.5 MG tablet Take 1 tablet (0.5 mg total) by mouth 2 (two) times daily as needed for anxiety. 8 tablet 0   apixaban  (ELIQUIS ) 5 MG TABS tablet Take 1 tablet (5 mg total) by mouth 2 (two) times daily.     B Complex-C (SUPER B COMPLEX PO) Take 1 tablet by mouth daily.     busPIRone  (BUSPAR ) 5 MG tablet Take 1 tablet (5 mg total) by mouth 2 (two) times daily. 10 tablet 0   Cholecalciferol 50 MCG (2000 UT) CAPS Take 2,000 Units by mouth daily at 6 (six) AM.     cloNIDine  (CATAPRES ) 0.2 MG tablet Take 1 tablet (0.2 mg total) by mouth 3 (three) times daily.     donepezil  (ARICEPT ) 5 MG tablet Take 5 mg by mouth at bedtime.     ezetimibe  (ZETIA ) 10 MG tablet Take 10 mg by mouth daily.     ferrous sulfate 325 (65 FE) MG tablet Take 325 mg by mouth daily with breakfast.     gabapentin  (NEURONTIN ) 300 MG capsule Take 1 capsule (300 mg total) by mouth 3 (three) times daily. (Patient taking differently: Take 300 mg by mouth 3 (three) times daily.) 90 capsule 1   metoprolol  tartrate (LOPRESSOR ) 25 MG tablet Take 1 tablet (25 mg total) by mouth 2 (two) times daily. 60 tablet 2   Multiple Vitamins-Minerals (MULTIVITAMIN WOMENS 50+ ADV) TABS Take 1 tablet by mouth daily.     QUEtiapine  (SEROQUEL ) 100 MG tablet Take 100 mg by mouth 2 (two) times daily.  saccharomyces boulardii (FLORASTOR) 250 MG capsule Take 250 mg by mouth daily at 6 (six) AM.     sertraline  (ZOLOFT ) 25 MG tablet Take 75 mg by mouth daily.      No current facility-administered medications for this visit.    Allergies as of 03/09/2024 - Review Complete 03/09/2024  Allergen Reaction Noted   Lisinopril Swelling 09/24/1996   Hydrochlorothiazide Other (See Comments) 09/10/2011   Lipitor [atorvastatin calcium ] Other (See Comments) 09/10/2011   Thiazide-type diuretics Other (See Comments)    Zocor [simvastatin] Swelling 09/10/2011   Codeine Nausea Only 09/24/2001    Social History   Socioeconomic History   Marital status: Widowed    Spouse name: Not on file   Number of children: Not on file   Years of education: Not on file   Highest education level: Not on file  Occupational History   Not on file  Tobacco Use   Smoking status: Former    Current packs/day: 0.00    Average packs/day: 0.3 packs/day for 10.0 years (2.5 ttl pk-yrs)    Types: Cigarettes    Start date: 68    Quit date: 56    Years since quitting: 45.4   Smokeless tobacco: Never   Tobacco comments:    Quit 50 years ago  Vaping Use   Vaping status: Never Used  Substance and Sexual Activity   Alcohol use: No   Drug use: No   Sexual activity: Not Currently  Other Topics Concern   Not on file  Social History Narrative   Not on file   Social Drivers of Health   Financial Resource Strain: Low Risk  (03/28/2023)   Received from [facility]   Overall [employer] Strain (CARDIA)    Difficulty of Paying Living Expenses: Not hard at all  Food Insecurity: No Food Insecurity (02/19/2024)   Hunger Vital Sign    Worried About Running Out of Food in the Last Year: Never true    Ran Out of Food in the Last Year: Never true  Transportation Needs: No Transportation Needs (02/19/2024)   PRAPARE - Administrator, Civil Service (Medical): No    Lack of Transportation (Non-Medical): No  Physical Activity: Insufficiently Active (02/05/2023)   [facility]   Exercise Vital Sign    On average, how many days per week  do you engage in moderate to strenuous exercise (like a brisk walk)?: 2 days    On average, how many minutes do you engage in exercise at this level?: 10 min  Stress: Stress Concern Present (02/05/2023)   Received from [facility] - Occupational Stress Questionnaire    Feeling of Stress : Very much  Social Connections: Unknown (02/19/2024)   Social Connection and Isolation Panel    Frequency of Communication with Friends and Family: More than three times a week    Frequency of Social Gatherings with Friends and Family: Twice a week    Attends Religious Services: 1 to 4 times per year    Active Member of [employer] or Organizations: No    Attends Engineer, structural: Never    Marital Status: Patient declined    Review of systems General: negative for malaise, night sweats, fever, chills, weight loss Neck: Negative for lumps, goiter, pain and significant neck swelling Resp: Negative for cough, wheezing, dyspnea at rest CV: Negative for chest pain, leg swelling, palpitations, orthopnea GI: denies melena, nausea, vomiting, diarrhea, constipation,  dysphagia, odyonophagia, early satiety or unintentional weight loss. +hematochezia +upper abdominal soreness  The remainder of the review of systems is noncontributory.  Physical Exam: BP 134/73   Pulse 91   Temp 98.2 F (36.8 C)   Ht 5' 3.5 (1.613 m)   Wt 203 lb 3.2 oz (92.2 kg)   BMI 35.43 kg/m  General:   Alert and oriented. No distress noted. Pleasant and cooperative.  In wheelchair  Head:  Normocephalic and atraumatic. Eyes:  Conjuctiva clear without scleral icterus. Mouth:  Oral mucosa pink and moist. Good dentition. No lesions. Heart: Normal rate and rhythm, s1 and s2 heart sounds present.  Lungs: Clear lung sounds in all lobes. Respirations equal and unlabored. Abdomen:  +BS, soft, non-tender and non-distended. No rebound or guarding. No HSM or masses noted. Extremities:  Without  edema. Neurologic:  Alert and  oriented x4 Psych:  Alert and cooperative. Normal mood and affect.  Invalid input(s): 6 MONTHS   ASSESSMENT: [name] is a 89 y.o. female presenting today for hospital follow up of rectal bleeding and anemia  Recent admission for anemia, rectal bleeding, colonoscopy with hemorrhoids and 11 polyps. Last hgb stable at 7.9, no labs since discharge per review of chart and papers sent from SNF. Patient overall feeling good but some fatigue. She reports an episode of BRB in stool last week that she made staff at [facility] aware of but no other episodes. No melena. She denies other GI issues. Currently on an Iron  pill. Recommend repeating CBC, iron  studies and continuing PO iron  for now. Further recommendations to follow lab results. I recommended she/staff make me aware of further rectal bleeding as we may need to discuss further endoscopic evaluations if this persists and/or blood/iron  counts do not return to baseline.    PLAN:  -CBC, iron  studies -continue PO iron  -pt/staff at SNF to make me aware of further rectal bleeding -will need to consider further endoscopic evaluations if bleeding persists and/or blood counts/iron  remains below baseline.   All questions were answered, patient verbalized understanding and is in agreement with plan as outlined above.   Follow Up: 4 months   [name] L. Roran Wegner, MSN, APRN, AGNP-C Adult-Gerontology Nurse Practitioner [facility]  I have reviewed the note and agree with the APP's assessment as described in this progress note  If progressive anemia, may need to proceed with capsule endoscopy.  Samantha Cress, MD Gastroenterology and Hepatology [facility]

## 2024-03-09 NOTE — Patient Instructions (Signed)
 We will check CBC and iron  studies to see how your blood counts are looking Continue with daily iron  pill Please make me aware of recurrent rectal bleeding  Follow up 4 months

## 2024-03-19 ENCOUNTER — Telehealth (INDEPENDENT_AMBULATORY_CARE_PROVIDER_SITE_OTHER): Payer: Self-pay | Admitting: Gastroenterology

## 2024-03-19 NOTE — Telephone Encounter (Signed)
 Contacted facility and asked to speak to nurse taking care of patient. I was transferred and the lady that picked up did not give up name and asked if I could call back in 15 minutes because she had to get another nurse. I have faxed provider response to [place] at [facility]. I have also placed labs in reminder file.

## 2024-03-19 NOTE — Telephone Encounter (Signed)
-----   Message from Mitzie LITTIE Boettcher sent at 03/18/2024  4:05 PM EDT ----- Please let patient/staff at SNF know that I reviewed iron  studies and CBC done at her facility, iron  sat mildly low, the rest of her iron  levels are normal, hgb has improved to 9.8 from 7.9 which is good news! For now we will continue with iron  pill daily, repeat CBC and Iron  studies again in 2 months ----- Message ----- From: Blanca Jenkins DEL Sent: 03/18/2024   8:10 AM EDT To: Mitzie LITTIE Boettcher, NP

## 2024-03-19 NOTE — Telephone Encounter (Signed)
 Angela Screace left voicemail stating we have been playing phone tag all morning. Returned call to facility but no answer

## 2024-03-23 NOTE — Telephone Encounter (Signed)
 [name], [employer] Nurse, left message to return call due to [employer] forwarded a message about speaking with someone about pt results. Called [facility] since that is the number Bridgett left, receptionist said since she is [employer] she may not be here. I stated that she left the facility number to call. Receptionist asked who the pt was and I told her, she then said let me transfer you to the nurses desk. I was transferred but no answer after multiple rings.

## 2024-05-08 ENCOUNTER — Telehealth (INDEPENDENT_AMBULATORY_CARE_PROVIDER_SITE_OTHER): Payer: Self-pay | Admitting: Gastroenterology

## 2024-05-08 NOTE — Telephone Encounter (Signed)
 Kristine Merle, LPN  Kristine Merle, LPN  repeat CBC and Iron  studies again in 2 months per [facility]  Lab orders printed and will mail to facility. Placed note on bottom of labs that pt is due for follow up in October also.

## 2024-05-29 ENCOUNTER — Telehealth: Payer: Self-pay | Admitting: Gastroenterology

## 2024-05-29 NOTE — Telephone Encounter (Signed)
 Received the results of the most recent blood workup performed on 05/22/2024 which showed hemoglobin of 11.6, MCV 91, WBC 5.1 and platelets 184, normal iron  stores with ferritin 62, iron  saturation 18%, iron  47 and TIBC 265.  Patient will follow in the GI clinic.  She will continue taking oral iron  daily indefinitely.  [facility], she will be seeing you next month.

## 2024-06-29 ENCOUNTER — Ambulatory Visit (INDEPENDENT_AMBULATORY_CARE_PROVIDER_SITE_OTHER): Admitting: Gastroenterology

## 2024-06-29 VITALS — BP 111/71 | HR 106 | Temp 97.1°F | Ht 64.5 in | Wt 212.2 lb

## 2024-06-29 DIAGNOSIS — D509 Iron deficiency anemia, unspecified: Secondary | ICD-10-CM

## 2024-06-29 DIAGNOSIS — D500 Iron deficiency anemia secondary to blood loss (chronic): Secondary | ICD-10-CM

## 2024-06-29 DIAGNOSIS — Z860101 Personal history of adenomatous and serrated colon polyps: Secondary | ICD-10-CM

## 2024-06-29 NOTE — Patient Instructions (Signed)
 Continue iron  pill twice daily We will repeat iron  panel and hemoglobin again at the end of December Let me know of any further rectal bleeding  Follow up 6 months

## 2024-06-29 NOTE — Progress Notes (Addendum)
 Referring Provider: Bobbette Coye LABOR, MD Primary Care Physician:  Bobbette Coye LABOR, MD Primary GI Physician: Dr. Eartha   Chief Complaint  Patient presents with   Follow-up   HPI:   [name] is a 89 y.o. female with past medical history of CKD, DM, HTN, A fib on Eliquis    Patient presenting today for:  Follow up of IDA  Last seen June, at that time feeling fatigued, having some SOB, dizzy. One episode of BRB in stool th week prior. Some soreness in upper abdomen. Taking iron  pill daily per [facility]  Recommended updating CBC, iron  studies, continue PO iron , consider further endoscopic evaluation if IDA persists.  Labs done 05/22/2024 which showed hemoglobin of 11.6, MCV 91, WBC 5.1 and platelets 184, normal iron  stores with ferritin 62, iron  saturation 18%, iron  47 and TIBC 265   Present:  States she is feeling okay. Feels she is eating well. Has gained some weight. Denies constipation or diarrhea. Denies seeing blood in her stools though she is unsure if staff has. She endorses SOB with exertion, she is not sure if this is any worse than baseline. Very occasionally gets dizzy. She endorses some fatigue at times. She thinks she is taking iron  pills twice per day, per MAR taking it BID.    Last Colonoscopy:02/21/24 - Eleven 4 to 15 mm polyps in the transverse colon,                            in the ascending colon and in the cecum, removed                            with a cold snare. Resected and retrieved.                           - Three 3 to 6 mm polyps in the sigmoid colon and                            in the descending colon, removed with a cold snare.                            Resected and retrieved.                           - Non-bleeding internal hemorrhoids. A. CECUM, ASCENDING AND TRANSVERSE COLON, POLYPECTOMY:  Tubular adenoma, multiple fragments  Negative for high-grade dysplasia and carcinoma   B. DESCENDING AND SIGMOID COLON, POLYPECTOMY:  Tubular  adenoma, multiple fragments  Negative for high-grade dysplasia and carcinoma    Last Endoscopy:never  Past Medical History:  Diagnosis Date   Anxiety    Arthritis    Asthma    Cancer (HCC)    SKIN CA OF FACE   Chronic kidney disease    Chronic pain    COVID    moderate case - had pneumonia   Depression    Diabetes mellitus without complication (HCC)    Gallstones    GERD (gastroesophageal reflux disease)    Hyperlipidemia    Hypertension    Insomnia    Iron  deficiency anemia    Paroxysmal atrial fibrillation (HCC)    Peri-prosthetic patellar fracture 09/19/2022   Pneumonia    Prosthetic  joint infection    RBBB 03/29/2022   TIA (transient ischemic attack)     Past Surgical History:  Procedure Laterality Date    ARM FRACTURE     LEFT  and right   CHOLECYSTECTOMY     COLONOSCOPY N/A 02/21/2024   Procedure: COLONOSCOPY;  Surgeon: Eartha Angelia Sieving, MD;  Location: [facility];  Service: Gastroenterology;  Laterality: N/A;   DILATION AND CURETTAGE OF UTERUS     IRRIGATION AND DEBRIDEMENT KNEE  09/12/2011   Procedure: IRRIGATION AND DEBRIDEMENT KNEE;  Surgeon: Dempsey JINNY Sensor;  Location: [facility] OR;  Service: Orthopedics;  Laterality: Right;  I&D RIGHT TKA REVISE BEARINGS/MBT. Poly exchange   PATELLAR TENDON REPAIR Left 09/21/2022   Procedure: LEFT PATELLAR TENDON REPAIR;  Surgeon: Sensor Dempsey, MD;  Location: [facility] OR;  Service: Orthopedics;  Laterality: Left;   REPLACEMENT TOTAL KNEE     right   TOTAL HIP ARTHROPLASTY Left    TOTAL HIP ARTHROPLASTY Right 01/20/2018   Procedure: TOTAL HIP ARTHROPLASTY WITH REMOVAL OF INTRAMEDULLARY NAIL;  Surgeon: Sensor Dempsey, MD;  Location: [facility] OR;  Service: Orthopedics;  Laterality: Right;   TOTAL KNEE ARTHROPLASTY Left 09/03/2022   Procedure: LEFT TOTAL KNEE ARTHROPLASTY;  Surgeon: Sensor Dempsey, MD;  Location: [facility];  Service: Orthopedics;  Laterality: Left;   TOTAL SHOULDER ARTHROPLASTY Right 10/02/2018   Procedure: REVERSE TOTAL  SHOULDER;  Surgeon: Dozier Soulier, MD;  Location: [facility] OR;  Service: Orthopedics;  Laterality: Right;    Current Outpatient Medications  Medication Sig Dispense Refill   acetaminophen  (TYLENOL ) 325 MG tablet Take 650 mg by mouth every 6 (six) hours as needed.     ALPRAZolam  (XANAX ) 0.5 MG tablet Take 1 tablet (0.5 mg total) by mouth 2 (two) times daily as needed for anxiety. 8 tablet 0   apixaban  (ELIQUIS ) 5 MG TABS tablet Take 1 tablet (5 mg total) by mouth 2 (two) times daily.     B Complex-C (SUPER B COMPLEX PO) Take 1 tablet by mouth daily.     busPIRone  (BUSPAR ) 5 MG tablet Take 1 tablet (5 mg total) by mouth 2 (two) times daily. 10 tablet 0   Cholecalciferol 50 MCG (2000 UT) CAPS Take 2,000 Units by mouth daily at 6 (six) AM.     cloNIDine  (CATAPRES ) 0.2 MG tablet Take 1 tablet (0.2 mg total) by mouth 3 (three) times daily.     donepezil  (ARICEPT ) 5 MG tablet Take 5 mg by mouth at bedtime.     ezetimibe  (ZETIA ) 10 MG tablet Take 10 mg by mouth daily.     ferrous sulfate 325 (65 FE) MG tablet Take 325 mg by mouth daily with breakfast.     furosemide  (LASIX ) 20 MG tablet Take 20 mg by mouth daily.     gabapentin  (NEURONTIN ) 300 MG capsule Take 1 capsule (300 mg total) by mouth 3 (three) times daily. 90 capsule 1   metoprolol  tartrate (LOPRESSOR ) 25 MG tablet Take 1 tablet (25 mg total) by mouth 2 (two) times daily. 60 tablet 2   Multiple Vitamins-Minerals (MULTIVITAMIN WOMENS 50+ ADV) TABS Take 1 tablet by mouth daily.     QUEtiapine  (SEROQUEL ) 100 MG tablet Take 100 mg by mouth 2 (two) times daily.     saccharomyces boulardii (FLORASTOR) 250 MG capsule Take 250 mg by mouth daily at 6 (six) AM.     sertraline  (ZOLOFT ) 25 MG tablet Take 75 mg by mouth daily.     No current facility-administered medications for this visit.  Allergies as of 06/29/2024 - Review Complete 06/29/2024  Allergen Reaction Noted   Lisinopril Swelling 09/24/1996   Hydrochlorothiazide Other (See Comments)  09/10/2011   Lipitor [atorvastatin calcium ] Other (See Comments) 09/10/2011   Thiazide-type diuretics Other (See Comments)    Zocor [simvastatin] Swelling 09/10/2011   Codeine Nausea Only 09/24/2001    Social History   Socioeconomic History   Marital status: Widowed    Spouse name: Not on file   Number of children: Not on file   Years of education: Not on file   Highest education level: Not on file  Occupational History   Not on file  Tobacco Use   Smoking status: Former    Current packs/day: 0.00    Average packs/day: 0.3 packs/day for 10.0 years (2.5 ttl pk-yrs)    Types: Cigarettes    Start date: 95    Quit date: 51    Years since quitting: 45.7   Smokeless tobacco: Never   Tobacco comments:    Quit 50 years ago  Vaping Use   Vaping status: Never Used  Substance and Sexual Activity   Alcohol use: No   Drug use: No   Sexual activity: Not Currently  Other Topics Concern   Not on file  Social History Narrative   Not on file   Social Drivers of Health   Financial Resource Strain: Low Risk  (03/28/2023)   Received from [facility]   Overall [employer] Strain (CARDIA)    Difficulty of Paying Living Expenses: Not hard at all  Food Insecurity: No Food Insecurity (02/19/2024)   Hunger Vital Sign    Worried About Running Out of Food in the Last Year: Never true    Ran Out of Food in the Last Year: Never true  Transportation Needs: No Transportation Needs (02/19/2024)   PRAPARE - Administrator, Civil Service (Medical): No    Lack of Transportation (Non-Medical): No  Physical Activity: Insufficiently Active (02/05/2023)   Received from [facility]   Exercise Vital Sign    On average, how many days per week do you engage in moderate to strenuous exercise (like a brisk walk)?: 2 days    On average, how many minutes do you engage in exercise at this level?: 10 min  Stress: Stress Concern Present (02/05/2023)   Received from [facility] - Occupational Stress Questionnaire    Feeling of Stress : Very much  Social Connections: Unknown (02/19/2024)   Social Connection and Isolation Panel    Frequency of Communication with Friends and Family: More than three times a week    Frequency of Social Gatherings with Friends and Family: Twice a week    Attends Religious Services: 1 to 4 times per year    Active Member of [employer] or Organizations: No    Attends Engineer, structural: Never    Marital Status: Patient declined    Review of systems General: negative for malaise, night sweats, fever, chills, weight loss Neck: Negative for lumps, goiter, pain and significant neck swelling Resp: Negative for cough, wheezing +dyspnea with exertion  CV: Negative for chest pain, leg swelling, palpitations, orthopnea GI: denies melena, hematochezia, nausea, vomiting, diarrhea, constipation, dysphagia, odyonophagia, early satiety or unintentional weight loss.  MSK: Negative for joint pain or swelling, back pain, and muscle pain. Derm: Negative for itching or rash Psych: Denies depression, anxiety, memory loss, confusion. No homicidal or suicidal ideation.  Heme: Negative for  prolonged bleeding, bruising easily, and swollen nodes. Endocrine: Negative for cold or heat intolerance, polyuria, polydipsia and goiter. Neuro: negative for tremor, gait imbalance, syncope and seizures. The remainder of the review of systems is noncontributory.  Physical Exam: BP 111/71 (BP Location: Left Arm, Patient Position: Sitting, Cuff Size: Large)   Pulse (!) 106   Temp (!) 97.1 F (36.2 C) (Temporal)   Ht 5' 4.5 (1.638 m)   Wt 212 lb 3.2 oz (96.3 kg)   BMI 35.86 kg/m  General:   Alert and oriented. No distress noted. Pleasant and cooperative. In wheelchair  Head:  Normocephalic and atraumatic. Eyes:  Conjuctiva clear without scleral icterus. Mouth:  Oral mucosa pink and moist. Good dentition. No  lesions. Heart: Normal rate and rhythm, s1 and s2 heart sounds present.  Lungs: Clear lung sounds in all lobes. Respirations equal and unlabored. Abdomen:  +BS, soft, non-tender and non-distended. No rebound or guarding. No HSM or masses noted. Derm: No palmar erythema or jaundice Msk:  Symmetrical without gross deformities. Normal posture. Extremities:  Without edema. Neurologic:  Alert and  oriented x4 Psych:  Alert and cooperative. Normal mood and affect.  Invalid input(s): 6 MONTHS   ASSESSMENT: [name] is a 89 y.o. female presenting today for follow up of IDA  Recent admission in May for anemia, rectal bleeding, colonoscopy at that time with hemorrhoids and 11 polyps. Recent hgb and iron  studies WNL, as outlined above. No further rectal bleeding or abdominal pain. Currently on an Iron  pill BID. Recommend continuing PO iron  BID for now, repeat h&h and iron  panel at the end of December. Further recommendations to follow lab results, we may need to discuss further endoscopic evaluations if there is further iron  deficiency despite supplemental PO iron , though labs stable at this time, therefore will hold off on any further evaluations.    PLAN:  -continue PO Iron  BID -repeat h&h, and Iron  panel at the end of December  -consider further endoscopic evaluations if IDA recurs despite supplemental Iron   All questions were answered, patient verbalized understanding and is in agreement with plan as outlined above.    Follow Up: 6 months   Illana Nolting L. Mariette, MSN, APRN, AGNP-C Adult-Gerontology Nurse Practitioner [facility]  I have reviewed the note and agree with the APP's assessment as described in this progress note  Toribio Fortune, MD Gastroenterology and Hepatology [facility] Gastroenterology

## 2024-07-30 ENCOUNTER — Encounter (INDEPENDENT_AMBULATORY_CARE_PROVIDER_SITE_OTHER): Payer: Self-pay | Admitting: Gastroenterology

## 2025-01-04 ENCOUNTER — Ambulatory Visit (INDEPENDENT_AMBULATORY_CARE_PROVIDER_SITE_OTHER): Admitting: Gastroenterology
